# Patient Record
Sex: Female | Born: 1950 | Race: Black or African American | Hispanic: No | Smoking: Current every day smoker
Health system: Southern US, Community
[De-identification: ages and names within clinical notes are randomized; demographics above are authoritative.]

## PROBLEM LIST (undated history)

## (undated) DIAGNOSIS — Z853 Personal history of malignant neoplasm of breast: Secondary | ICD-10-CM

## (undated) DIAGNOSIS — R809 Proteinuria, unspecified: Secondary | ICD-10-CM

## (undated) DIAGNOSIS — Z8719 Personal history of other diseases of the digestive system: Secondary | ICD-10-CM

## (undated) DIAGNOSIS — I5032 Chronic diastolic (congestive) heart failure: Secondary | ICD-10-CM

## (undated) DIAGNOSIS — D649 Anemia, unspecified: Secondary | ICD-10-CM

## (undated) DIAGNOSIS — Z923 Personal history of irradiation: Secondary | ICD-10-CM

## (undated) DIAGNOSIS — M1990 Unspecified osteoarthritis, unspecified site: Secondary | ICD-10-CM

## (undated) DIAGNOSIS — F329 Major depressive disorder, single episode, unspecified: Secondary | ICD-10-CM

## (undated) DIAGNOSIS — J449 Chronic obstructive pulmonary disease, unspecified: Secondary | ICD-10-CM

## (undated) DIAGNOSIS — F411 Generalized anxiety disorder: Secondary | ICD-10-CM

## (undated) DIAGNOSIS — I480 Paroxysmal atrial fibrillation: Secondary | ICD-10-CM

## (undated) DIAGNOSIS — C689 Malignant neoplasm of urinary organ, unspecified: Secondary | ICD-10-CM

## (undated) DIAGNOSIS — I119 Hypertensive heart disease without heart failure: Secondary | ICD-10-CM

## (undated) DIAGNOSIS — I4891 Unspecified atrial fibrillation: Secondary | ICD-10-CM

## (undated) DIAGNOSIS — I2510 Atherosclerotic heart disease of native coronary artery without angina pectoris: Secondary | ICD-10-CM

## (undated) DIAGNOSIS — Z8711 Personal history of peptic ulcer disease: Secondary | ICD-10-CM

## (undated) DIAGNOSIS — K219 Gastro-esophageal reflux disease without esophagitis: Secondary | ICD-10-CM

## (undated) DIAGNOSIS — Z8781 Personal history of (healed) traumatic fracture: Secondary | ICD-10-CM

## (undated) DIAGNOSIS — C50919 Malignant neoplasm of unspecified site of unspecified female breast: Secondary | ICD-10-CM

## (undated) DIAGNOSIS — M109 Gout, unspecified: Secondary | ICD-10-CM

## (undated) DIAGNOSIS — I700 Atherosclerosis of aorta: Secondary | ICD-10-CM

## (undated) DIAGNOSIS — N183 Chronic kidney disease, stage 3 (moderate): Secondary | ICD-10-CM

## (undated) DIAGNOSIS — E785 Hyperlipidemia, unspecified: Secondary | ICD-10-CM

## (undated) DIAGNOSIS — F32A Depression, unspecified: Secondary | ICD-10-CM

## (undated) HISTORY — PX: OTHER SURGICAL HISTORY: SHX169

## (undated) HISTORY — PX: ABDOMINAL HYSTERECTOMY: SHX81

## (undated) HISTORY — DX: Malignant neoplasm of unspecified site of unspecified female breast: C50.919

## (undated) HISTORY — DX: Gout, unspecified: M10.9

## (undated) HISTORY — PX: TONSILLECTOMY: SUR1361

---

## 2002-07-08 HISTORY — PX: WRIST SURGERY: SHX841

## 2011-08-14 HISTORY — PX: BREAST EXCISIONAL BIOPSY: SUR124

## 2011-09-11 HISTORY — PX: PARTIAL MASTECTOMY WITH AXILLARY SENTINEL LYMPH NODE BIOPSY: SHX6004

## 2011-10-04 ENCOUNTER — Telehealth: Payer: Self-pay | Admitting: *Deleted

## 2011-10-04 NOTE — Telephone Encounter (Signed)
Confirmed 10/25/11 appt w/ pt's son Mr. Ashley Royalty.  Mailed before appt letter & packet to son.  Took paperwork to Med Rec for chart.

## 2011-10-07 ENCOUNTER — Telehealth: Payer: Self-pay | Admitting: *Deleted

## 2011-10-07 NOTE — Telephone Encounter (Signed)
Received call from [facility] stating that Dr. Donnie Coffin will not be here and is taking the whole day off, so I called pts son Mr. Victoria Burke and confirmed 11/08/11 appt w/ him.  Mailed another appt letter to him.  Took changes to Med Rec for chart.

## 2011-10-25 ENCOUNTER — Ambulatory Visit: Payer: Self-pay

## 2011-10-25 ENCOUNTER — Ambulatory Visit: Payer: Self-pay | Admitting: Oncology

## 2011-10-25 ENCOUNTER — Other Ambulatory Visit: Payer: Self-pay | Admitting: Lab

## 2011-10-30 ENCOUNTER — Telehealth: Payer: Self-pay | Admitting: *Deleted

## 2011-10-30 NOTE — Telephone Encounter (Signed)
Spoke with Renny's son and confirmed 11/07/11 appt w/ him.  Mailed new before appt letter & packet to pt.  Took paperwork to Med Rec for chart.

## 2011-11-04 ENCOUNTER — Other Ambulatory Visit: Payer: Self-pay | Admitting: *Deleted

## 2011-11-04 DIAGNOSIS — Z853 Personal history of malignant neoplasm of breast: Secondary | ICD-10-CM

## 2011-11-04 DIAGNOSIS — C50212 Malignant neoplasm of upper-inner quadrant of left female breast: Secondary | ICD-10-CM

## 2011-11-04 HISTORY — DX: Personal history of malignant neoplasm of breast: Z85.3

## 2011-11-07 ENCOUNTER — Telehealth: Payer: Self-pay | Admitting: Oncology

## 2011-11-07 ENCOUNTER — Encounter: Payer: Self-pay | Admitting: Oncology

## 2011-11-07 ENCOUNTER — Telehealth: Payer: Self-pay | Admitting: *Deleted

## 2011-11-07 ENCOUNTER — Ambulatory Visit: Payer: Non-veteran care

## 2011-11-07 ENCOUNTER — Ambulatory Visit (HOSPITAL_BASED_OUTPATIENT_CLINIC_OR_DEPARTMENT_OTHER): Admitting: Oncology

## 2011-11-07 ENCOUNTER — Other Ambulatory Visit (HOSPITAL_BASED_OUTPATIENT_CLINIC_OR_DEPARTMENT_OTHER)

## 2011-11-07 VITALS — BP 229/105 | HR 65 | Temp 98.2°F | Ht 61.0 in | Wt 122.9 lb

## 2011-11-07 DIAGNOSIS — C50919 Malignant neoplasm of unspecified site of unspecified female breast: Secondary | ICD-10-CM

## 2011-11-07 DIAGNOSIS — C50212 Malignant neoplasm of upper-inner quadrant of left female breast: Secondary | ICD-10-CM

## 2011-11-07 DIAGNOSIS — C50419 Malignant neoplasm of upper-outer quadrant of unspecified female breast: Secondary | ICD-10-CM

## 2011-11-07 DIAGNOSIS — I10 Essential (primary) hypertension: Secondary | ICD-10-CM

## 2011-11-07 DIAGNOSIS — Z170 Estrogen receptor positive status [ER+]: Secondary | ICD-10-CM

## 2011-11-07 LAB — COMPREHENSIVE METABOLIC PANEL
BUN: 17 mg/dL (ref 6–23)
CO2: 26 mEq/L (ref 19–32)
Creatinine, Ser: 0.71 mg/dL (ref 0.50–1.10)
Glucose, Bld: 89 mg/dL (ref 70–99)
Total Bilirubin: 0.3 mg/dL (ref 0.3–1.2)

## 2011-11-07 LAB — CBC WITH DIFFERENTIAL/PLATELET
Basophils Absolute: 0 10*3/uL (ref 0.0–0.1)
Eosinophils Absolute: 0 10*3/uL (ref 0.0–0.5)
LYMPH%: 44 % (ref 14.0–49.7)
MCV: 95.4 fL (ref 79.5–101.0)
MONO%: 7.5 % (ref 0.0–14.0)
NEUT#: 2.4 10*3/uL (ref 1.5–6.5)
NEUT%: 47.5 % (ref 38.4–76.8)
Platelets: 281 10*3/uL (ref 145–400)
RBC: 4.38 10*6/uL (ref 3.70–5.45)
nRBC: 0 % (ref 0–0)

## 2011-11-07 NOTE — Telephone Encounter (Signed)
I called [facility] to follow up on the patient's pathology report that read equivocal for her 2 neu/  No further testing was completed. I requested the slides to be sent to our pathology dept.

## 2011-11-07 NOTE — Telephone Encounter (Signed)
gave patient appointment for 12-13-2011 md [facility]) gave patient appointment for 11-13-2011 starting at 11-13-2011 starting at 10:30am printed out calendar and gave to the patient

## 2011-11-07 NOTE — Patient Instructions (Signed)
1. We will review your pathology from [place] [facility]. We are getting the slides from there to have them reviewed by our pathologist  2. Refer to Radiation oncology  3. I will see you back in 1 month or sooner

## 2011-11-07 NOTE — Progress Notes (Signed)
New patient today came in with son and family friend, patient is on medicaid patient also has VA benefits insurance, I informed patient about financial assistance patient will wait until she talks with doctor to see if she will need further assistance.

## 2011-11-07 NOTE — Progress Notes (Signed)
Victoria Burke [id number] August 04, 1950 61 y.o. 11/07/2011 4:42 PM  CC Dr. Leanora Cover Dr. 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 Leanora Cover  HISTORY OF PRESENT ILLNESS:  Victoria Burke is a 61 y.o. female with multiple medical problems. She went for a routine mammogram and was found to have mass in the left breast. Ultrasound on 07/23/11 revealed a hypoechoic solid mass at the 2:00 position. This mass apparently had been present previously but had increased in size in comparison to prior imaging from 04/11/11. She was seen by Dr. Percell Boston and went on to have an excisional biopsy on 08/14/11 and a  lumpectomy with SNL on 09/11/11. The pathology showed Invasive ductal carcinoma with DCIS, grade 2, measuring 3.4 cm, ER+, PR+, Her2Neu equivocal. She has now relocated to [place] and is establishing her care at the cancer center. She complains of pain at the surgical site and is very anxious about her diagnosis. She is accompanied by her son and his friend.  Past Medical History: Past Medical History  Diagnosis Date  . 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 Hypertension 11/07/2011    Past Surgical History: Past Surgical History  Procedure Date  . Mastectomy partial / lumpectomy 2/7, 3/7    with SNL 3.4 cm    Family History: History reviewed. No pertinent family history.  Social History History  Substance Use Topics  . Smoking status: Current Everyday Smoker -- 1.0 packs/day  . Smokeless tobacco: Never Used  . Alcohol Use: 7.2 oz/week    12 Cans of beer per week    Allergies: No Known Allergies  Current Medications: Current Outpatient Prescriptions  Medication Sig Dispense Refill  . UNKNOWN TO PATIENT Blood pressure medication BID 300mg         OB/GYN  History:menarche at 13, s/p postmenopausal, G2P2O0, no HRT,   Fertility Discussion: N/A  ECOG PERFORMANCE STATUS: 1 - Symptomatic but completely ambulatory  Genetic Counseling/testing: N/A  REVIEW OF SYSTEMS:  Constitutional: positive for fatigue and malaise Ears, nose, mouth, throat, and face: negative Respiratory: negative Cardiovascular: negative Gastrointestinal: negative Genitourinary:negative Integument/breast: positive for breast tenderness Musculoskeletal:positive for arthralgias, myalgias and stiff joints Neurological: negative  PHYSICAL EXAMINATION: Blood pressure 229/105, pulse 65, temperature 98.2 F (36.8 C), temperature source Oral, height 5\' 1"  (1.549 m), weight 122 lb 14.4 oz (55.747 kg).  QIO:NGEXB, no distress, well nourished, well developed and anxious SKIN: skin color, texture, turgor are normal HEAD: Normocephalic EYES: PERRLA, EOMI, Conjunctiva are pink and non-injected, sclera clear EARS: External ears normal OROPHARYNX:no exudate and no erythema  NECK: supple, no adenopathy LYMPH:  no palpable lymphadenopathy, no hepatosplenomegaly BREAST:right breast normal without mass, skin or nipple changes or axillary nodes, surgical scars noted in left breast no nipple discharge or masses LUNGS: clear to auscultation and percussion HEART: regular rate & rhythm ABDOMEN:abdomen soft, non-tender, normal bowel sounds and no masses or organomegaly BACK: Back symmetric, no curvature. EXTREMITIES:no edema, no clubbing, no cyanosis  NEURO: alert & oriented x 3 with fluent speech, no focal motor/sensory deficits, gait normal, reflexes normal and symmetric     STUDIES/RESULTS: No results found.   LABS:    Chemistry      Component Value Date/Time   NA 140 11/07/2011 1514   K 3.3* 11/07/2011 1514  CL 105 11/07/2011 1514   CO2 26 11/07/2011 1514   BUN 17 11/07/2011 1514   CREATININE 0.71 11/07/2011 1514      Component Value Date/Time   CALCIUM 9.3 11/07/2011 1514    ALKPHOS 100 11/07/2011 1514   AST 46* 11/07/2011 1514   ALT 41* 11/07/2011 1514   BILITOT 0.3 11/07/2011 1514      Lab Results  Component Value Date   WBC 5.0 11/07/2011   HGB 14.1 11/07/2011   HCT 41.8 11/07/2011   MCV 95.4 11/07/2011   PLT 281 11/07/2011           ASSESSMENT    61 year old with:  1. New diagnosis of stage II invasive ductal carcinoma of the left breast. She was diagnosed in [place]. She has undergone a lumpectomy with SNL 0/2 LN positive. Tumor R positive, PR positive and Her2Neu was equivocal.   2. Arthirits  3. Adult sleep apnea  PLAN:    1. I discussed the pathology, treatment and diagnosis of breast cancer with the patient and her family. I explained that we will need to get her tissue blocks and have the Her2Neu repeated. I explained the rational for repeating the test.  2. We also discussed the treatment options with them.  3. I also explained that she will need radiation to the left breast. I have made a referral for her.  4. I will see her back in a few weeks or sooner.     Thank you so much for allowing me to participate in the care of Victoria Burke. I will continue to follow up the patient with you and assist in her care.  All questions were answered. The patient knows to call the clinic with any problems, questions or concerns. We can certainly see the patient much sooner if necessary.  I spent 60 minutes counseling the patient face to face. The total time spent in the appointment was 60 minutes.  Drue Second, MD Medical/Oncology [facility] [phone] (beeper) [phone] (Office)  11/07/2011, 4:42 PM 11/07/2011, 4:42 PM

## 2011-11-08 ENCOUNTER — Ambulatory Visit: Payer: Self-pay

## 2011-11-08 ENCOUNTER — Ambulatory Visit: Payer: Self-pay | Admitting: Oncology

## 2011-11-08 ENCOUNTER — Other Ambulatory Visit: Payer: Self-pay | Admitting: Lab

## 2011-11-11 ENCOUNTER — Encounter: Payer: Self-pay | Admitting: *Deleted

## 2011-11-11 NOTE — Progress Notes (Signed)
Mailed after appt letter to pt. 

## 2011-11-13 ENCOUNTER — Ambulatory Visit: Admission: RE | Admit: 2011-11-13 | Payer: Non-veteran care | Source: Ambulatory Visit | Admitting: Radiation Oncology

## 2011-11-13 ENCOUNTER — Telehealth: Payer: Self-pay | Admitting: Oncology

## 2011-11-13 ENCOUNTER — Ambulatory Visit: Payer: Non-veteran care

## 2011-11-13 NOTE — Telephone Encounter (Signed)
The son called back.   And he expressed anxiety about the change in appointment.  I explained to him that we had just received the slides and the radiation oncologist would not be able to offer definitive treatment until pathology reviewed.    The son verbalized understanding and I gave him my number and pager number for further questions.  I emailed her physician team to make them aware.

## 2011-11-13 NOTE — Telephone Encounter (Signed)
I called and left a message explaining that I had met with the patient and son- at the time of their consult with Dr. Welton Flakes.   The pathology report received from [facility] was incomplete, the Her 2 neu result was read as equivocal.   I had contacted this path dept and requested all slides to be sent.   There was also a question in the margin status.    I had conveyed Dr. Milta Deiters request for a path review to Dr. Colonel Bald, the director of pathology.   I explained the pt and the family at the time of their consult that I would do everything I could to expedite this process however, the MDs really need this information to be able to give treatment recommendations.     I had contacted the path dept yesterday, and they finally received the slides yesterday however, the pathologist had not been able to review- so I suggested that we reschedule the consult with the Rad Oncologist so that recommendations could be conveyed appropriately.  Clydie Braun in radiation communicated the son was upset about the rescheduled appointment.   I left a message for the son reviewing the information I had given them at the time of Dr. Milta Deiters consult and explained that we had not received the slides until yesterday and we want to provide the best care possible for the patient.   I left my number and asked for a return call.   At consult, a Journey and my business card were given to the patient, so they have my contact information, in addition to my voice mail message I left for them.

## 2011-11-19 ENCOUNTER — Ambulatory Visit
Admission: RE | Admit: 2011-11-19 | Discharge: 2011-11-19 | Disposition: A | Discharge status: Home / Self Care | Source: Ambulatory Visit | Attending: Radiation Oncology | Admitting: Radiation Oncology

## 2011-11-19 ENCOUNTER — Encounter: Payer: Self-pay | Admitting: *Deleted

## 2011-11-19 ENCOUNTER — Encounter: Payer: Self-pay | Admitting: Radiation Oncology

## 2011-11-19 VITALS — BP 168/106 | HR 84 | Temp 97.9°F | Resp 20 | Ht 61.0 in | Wt 121.3 lb

## 2011-11-19 DIAGNOSIS — K219 Gastro-esophageal reflux disease without esophagitis: Secondary | ICD-10-CM | POA: Insufficient documentation

## 2011-11-19 DIAGNOSIS — I10 Essential (primary) hypertension: Secondary | ICD-10-CM | POA: Insufficient documentation

## 2011-11-19 DIAGNOSIS — C50419 Malignant neoplasm of upper-outer quadrant of unspecified female breast: Secondary | ICD-10-CM | POA: Insufficient documentation

## 2011-11-19 DIAGNOSIS — Z79899 Other long term (current) drug therapy: Secondary | ICD-10-CM | POA: Insufficient documentation

## 2011-11-19 DIAGNOSIS — C50919 Malignant neoplasm of unspecified site of unspecified female breast: Secondary | ICD-10-CM

## 2011-11-19 HISTORY — DX: Gastro-esophageal reflux disease without esophagitis: K21.9

## 2011-11-19 HISTORY — DX: Unspecified osteoarthritis, unspecified site: M19.90

## 2011-11-19 NOTE — Progress Notes (Signed)
Please see the Nurse Progress Note in the MD Initial Consult Encounter for this patient. 

## 2011-11-19 NOTE — Progress Notes (Addendum)
Pt states she lived in [place] until 1 month ago, returned to [place] due to her father passing away. Pt has no PCP locally, BP high today. She states she ran out of med today.  08/14/11 Left mastectomy, performed in [place]:DCIS, inv ductal carcinoma, ER/PR + ____________________________________________________________________________________________________   [facility] Radiation Oncology NEW PATIENT EVALUATION  Name: Victoria Burke MRN: [id number]  Date:   11/19/2011           DOB: 1951-02-08  Status: outpatient   CC: No primary provider on file.  Victoria December, MD, Dr. Leanora Burke (ph # [phone])   REFERRING PHYSICIAN: [name], MD   DIAGNOSIS: Stage IIA (T2 N0 M0) invasive ductal/DCIS of the left breast   HISTORY OF PRESENT ILLNESS:  Victoria Burke is a 61 y.o. female who is seen today for the courtesy of Dr. Welton Burke for consideration of radiation therapy in the management of her stage IIA (T2 N0 M0) invasive ductal carcinoma/DCIS of the left breast. At the time of a screening mammogram back in October 2012 at [facility] she was found to have a mass within the upper outer left breast respond to a palpable lump a. This was apparently seen in November of 2011 by ultrasound and felt to be suspicious for malignancy. An ultrasound on 04/11/2011 suggest that the mass was smaller and was felt to be probably benign with a short interval followup recommended. Short interval followup on 07/23/2011 showed interval enlargement and thus the mass was felt to be suspicious for malignancy. The patient was seen by Dr. Leanora Burke, a general surgeon in [place], and she underwent an excisional biopsy on August 14, 2011. Pathology showed a 3.4 x 2.8 cm invasive ductal/DCIS with positive margins. Tumor extendied close to the skin. HER-2/neu was equivocal and found to be negative when recently tested here. On 09/11/2011 she underwent a left  partial mastectomy and sentinel lymph node biopsy. There was no residual invasive carcinoma, however there was DCIS focally involving the deep margin. A small 2 mm nodule was also excised from the tumor bed in this showed fibrous tissue with foci of DCIS. Her tumor was reportedly ER/PR positive. She moved to [facility] after her father's recent death and desires to have her radiation therapy and systemic therapy here. She is without complaints today.  PREVIOUS RADIATION THERAPY: No   PAST MEDICAL HISTORY:  has a past medical history of Breast cancer (08/15/11); Hypertension (11/07/2011); Arthritis; Sleep apnea; GERD (gastroesophageal reflux disease); Panic disorder; Cervical spine fracture; and Jaundice.     PAST SURGICAL HISTORY:  Past Surgical History  Procedure Date  . Mastectomy partial / lumpectomy 2/7, 3/7    with SNL 3.4 cm  . Partial hysterectomy   . Abdominal hysterectomy     tumor on right ovary, age 89's  . Wrist surgery      FAMILY HISTORY: family history includes Cancer in her paternal aunt. Her father died in 08/28/22 of this year in his early 26s, unknown cause. Her mother died from cirrhosis at age 60. She is apparently a heavy drinker.   SOCIAL HISTORY:  reports that she has been smoking.  She has never used smokeless tobacco. She reports that she drinks about 7.2 ounces of alcohol per week. She reports that she uses illicit drugs (Marijuana). Separated, 2 children. 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  ALLERGIES: Review of patient's allergies indicates no known allergies.   MEDICATIONS:  Current  Outpatient Prescriptions  Medication Sig Dispense Refill  . lisinopril-hydrochlorothiazide (PRINZIDE,ZESTORETIC) 20-12.5 MG per tablet Take 2 tablets by mouth daily.      Marland Kitchen oxyCODONE-acetaminophen (PERCOCET) 5-325 MG per tablet Take 1 tablet by mouth every 4 (four) hours as needed.         REVIEW OF SYSTEMS:  Pertinent items are noted in HPI.    PHYSICAL  EXAM:  height is 5\' 1"  (1.549 m) and weight is 121 lb 4.8 oz (55.021 kg). Her oral temperature is 97.9 F (36.6 C). Her blood pressure is 168/106 and her pulse is 84. Her respiration is 65.   61 year old African female appearing younger than her stated age. Vital signs: Wt Readings from Last 3 Encounters:  11/19/11 121 lb 4.8 oz (55.021 kg)  11/07/11 122 lb 14.4 oz (55.747 kg)   Temp Readings from Last 3 Encounters:  11/19/11 97.9 F (36.6 C) Oral  11/07/11 98.2 F (36.8 C) Oral   BP Readings from Last 3 Encounters:  11/19/11 168/106  11/07/11 229/105   Pulse Readings from Last 3 Encounters:  11/19/11 84  11/07/11 65    And neck examination: Grossly unremarkable. Nodes: There is no palpable cervical, supraclavicular, or axillary lymphadenopathy. There is a separate transverse incision scar along the axilla. Chest: Lungs clear. Back: Without spinal or CVA discomfort. Heart: Regular rate and rhythm.: There is no upper outer quadrant partial mastectomy scar. No masses are appreciated. Right breast without masses or lesions. Abdomen: Without masses organomegaly. Extremities: Without edema. Neurologic examination: Grossly unremarkable.  LABORATORY DATA:  Lab Results  Component Value Date   WBC 5.0 11/07/2011   HGB 14.1 11/07/2011   HCT 41.8 11/07/2011   MCV 95.4 11/07/2011   PLT 281 11/07/2011   Lab Results  Component Value Date   NA 140 11/07/2011   K 3.3* 11/07/2011   CL 105 11/07/2011   CO2 26 11/07/2011   Lab Results  Component Value Date   ALT 41* 11/07/2011   AST 46* 11/07/2011   ALKPHOS 100 11/07/2011   BILITOT 0.3 11/07/2011      IMPRESSION: Stage IIA (T2, N0, M0) invasive ductal/DCIS of the left breast. I explained to the patient that her local treatment options include mastectomy versus partial mastectomy followed by radiation therapy. She desires breast preservation. Based on the NCCN guidelines, she should have at least a 1 mm margin for DCIS. Her invasive disease has been cleared. I  spoke with Dr. Percell Burke and he'll have the patient return to [facility] for reexcision and then send her back to [facility] for her radiation therapy/systemic therapy. I requested that we be notified of her expected surgery date so I can get her scheduled for a followup visit. I also spoke with her son from  who understands the need for further surgery. I discussed the potential acute and late toxicities of radiation therapy. She can be considered for deep inspiration/breath-hold to avoid cardiac toxicity or prone irradiation based on her breast size. There is a concern that he may not have satisfactory coverage of the axillary tail with her being treated prone. We can discuss this further when she returns for a followup visit.   PLAN: As discussed above. I should be notified of her surgery date soft and get her scheduled for a followup visit. In the meantime, I'll get her tentatively scheduled for a followup visit in one month.   I spent 60 minutes minutes face to face with the patient and more than 50% of that  time was spent in counseling and/or coordination of care.

## 2011-11-20 ENCOUNTER — Telehealth: Payer: Self-pay | Admitting: Oncology

## 2011-11-20 ENCOUNTER — Encounter: Payer: Self-pay | Admitting: *Deleted

## 2011-11-20 NOTE — Progress Notes (Signed)
Encounter addended by: Maryln Gottron, MD on: 11/20/2011  9:28 PM<BR>     Documentation filed: Normajean Glasgow VN

## 2011-11-20 NOTE — Progress Notes (Signed)
Encounter addended by: Maryln Gottron, MD on: 11/20/2011  9:23 PM<BR>     Documentation filed: Visit Diagnoses, Inpatient Notes, Notes Section

## 2011-11-20 NOTE — Telephone Encounter (Signed)
I wanted to follow up with the son after he expressed frustration to the [employer].   I left a message and I will try again tomorrow.  This patient had surgery at another facility and transferring care requires time therefore our process cannot be as efficient.  I had rescheduled a rad onc consult to accommodate the path slides arrival to be reviewed here so the physician could make absolute recommendations.  I will try to reach tomorrow.

## 2011-11-20 NOTE — Progress Notes (Signed)
Ordered Oncotype Dx test w/ [facility].  Faxed request to Path. 

## 2011-11-25 ENCOUNTER — Telehealth: Payer: Self-pay | Admitting: Oncology

## 2011-11-25 NOTE — Telephone Encounter (Signed)
I called Dana from Dr. Ruel Favors office to see if there had been an appt scheduled-  And there has not been,    I asked Annabelle Harman to contact the son to schedule an appt and I texted the son with Dana's number to contact her.   I asked for the son to let me know when it is scheduled so we can obtain records again.

## 2011-11-27 ENCOUNTER — Encounter: Payer: Self-pay | Admitting: Dietician

## 2011-12-03 ENCOUNTER — Encounter: Payer: Self-pay | Admitting: *Deleted

## 2011-12-03 NOTE — Progress Notes (Signed)
Received Oncotype Dx results of 8.  Gave copy to MD.  Took copy to Med Rec to scan. 

## 2011-12-04 ENCOUNTER — Encounter: Payer: Self-pay | Admitting: Oncology

## 2011-12-04 NOTE — Progress Notes (Unsigned)
The son notified me that the patient's surgery is scheduled June 6 in [place].   She is currently scheduled to see Dr. Welton Flakes June 7 so I asked Edwena Bunde to reschedule her follow up with Dr. Welton Flakes.   Her oncotype score is low., so delaying her follow up with Dr. Welton Flakes will not interefer with her care.

## 2011-12-13 ENCOUNTER — Ambulatory Visit: Payer: Non-veteran care | Admitting: Oncology

## 2011-12-17 ENCOUNTER — Encounter: Payer: Self-pay | Admitting: Radiation Oncology

## 2011-12-18 ENCOUNTER — Telehealth: Payer: Self-pay | Admitting: Medical Oncology

## 2011-12-18 ENCOUNTER — Telehealth: Payer: Self-pay | Admitting: *Deleted

## 2011-12-18 ENCOUNTER — Encounter: Payer: Self-pay | Admitting: Radiation Oncology

## 2011-12-18 NOTE — Telephone Encounter (Signed)
Received call from [facility] in radiation oncology stating that patient was unaware of reschedule appointments on 6/27.  Contacted patient with the new date and time of appointments.  Patient confirmed appointments on 6/27: lab @ 1 and MD @ 130.  Instructed patient to arrive a little before 1 to be checked in with registration.  Patient expressed understanding, no further questions at this time.

## 2011-12-18 NOTE — Telephone Encounter (Signed)
Pt has not had re-excision of breast. Per Dr Ruel Favors note dated 12/12/11, pt is scheduled for this surgery on 01/03/12. Dr Dayton Scrape requested pt be rescheduled for FU new consult on 01/14/12. Called pt to reschedule FU new consult w/Dr Dayton Scrape. Pt states she plans to reschedule her surgery to 01/10/12 so her son can be present. She also stated she will have surgery in [place], [place] not in [place]. She has not rescheduled it at this time. Pt does request her appt w/Dr Dayton Scrape be rescheduled for 01/14/12 and understands it may be changed if she delays her surgery.   Also noted to pt that she did not see Dr Welton Flakes on 12/13/11.  Pt stated she was unaware of that appt. Informed her would call Dr Milta Deiters office and have them call her re: reschedule appt. Left vm for Dr Milta Deiters RN to call pt and remind her of appt later this month.

## 2011-12-19 ENCOUNTER — Ambulatory Visit

## 2011-12-19 ENCOUNTER — Ambulatory Visit: Admitting: Radiation Oncology

## 2011-12-25 ENCOUNTER — Encounter: Payer: Self-pay | Admitting: *Deleted

## 2011-12-25 ENCOUNTER — Telehealth: Payer: Self-pay | Admitting: Oncology

## 2011-12-25 ENCOUNTER — Telehealth: Payer: Self-pay | Admitting: *Deleted

## 2011-12-25 NOTE — Telephone Encounter (Signed)
Pt's son called stating that surgery has been changed in [place] to 7/5.  Confirmed 01/14/12 appt w/ him.

## 2011-12-25 NOTE — Telephone Encounter (Signed)
S/w the pt and she is aware of her r/s June appts to July due to the md is on call on 01/02/2012

## 2011-12-30 ENCOUNTER — Encounter: Payer: Self-pay | Admitting: *Deleted

## 2011-12-30 NOTE — Progress Notes (Signed)
[facility] Psychosocial Distress Screening Clinical Social Work  Clinical Social Work was referred by distress screening protocol.  The patient scored a 6 on the Psychosocial Distress Thermometer which indicates moderate distress. Clinical Social Worker contacted pt at home to assess for distress and other psychosocial needs.  Pt stated she was doing well, and getting ready for her surgery on 01/10/12.  CSW informed pt of the support team and supportive services at [facility].  Pt expressed need for assistance with transportation.  CSW informed pt of [facility] and their ability to send a 1 time gas card.  Pt agreed to CSW making referral to [facility] for transportation assistance.  CSW encouraged pt to call with any additional needs or concerns.      Tamala Julian, MSW, LCSW Clinical Social Worker [facility] [phone]

## 2012-01-02 ENCOUNTER — Ambulatory Visit: Admitting: Oncology

## 2012-01-02 ENCOUNTER — Other Ambulatory Visit: Admitting: Lab

## 2012-01-08 ENCOUNTER — Other Ambulatory Visit: Admitting: Lab

## 2012-01-08 ENCOUNTER — Ambulatory Visit: Admitting: Oncology

## 2012-01-14 ENCOUNTER — Ambulatory Visit: Admitting: Oncology

## 2012-01-14 ENCOUNTER — Ambulatory Visit

## 2012-01-14 ENCOUNTER — Ambulatory Visit: Admitting: Radiation Oncology

## 2012-01-14 ENCOUNTER — Other Ambulatory Visit: Admitting: Lab

## 2012-01-14 ENCOUNTER — Other Ambulatory Visit: Payer: Self-pay | Admitting: *Deleted

## 2012-01-14 DIAGNOSIS — C50419 Malignant neoplasm of upper-outer quadrant of unspecified female breast: Secondary | ICD-10-CM

## 2012-01-15 ENCOUNTER — Encounter: Payer: Self-pay | Admitting: *Deleted

## 2012-01-15 ENCOUNTER — Telehealth: Payer: Self-pay | Admitting: *Deleted

## 2012-01-15 NOTE — Telephone Encounter (Signed)
[facility] 01/14/12. Called pt, inquired about missed appt. Pt states " I didn't have a ride" Requested to transfer pt to scheduling to allow r/s appt. Pt requested not to be transferred as she was at eye doctor.  Pt advised it would be better to call her later today to r/s appt. Verbalized understanding. Onc tx sent

## 2012-01-16 ENCOUNTER — Telehealth: Payer: Self-pay | Admitting: Oncology

## 2012-01-16 NOTE — Telephone Encounter (Signed)
lmonvm advising the pt of her appt on 01/30/2012. Per ftka pt missed her last appt

## 2012-01-17 ENCOUNTER — Telehealth: Payer: Self-pay | Admitting: *Deleted

## 2012-01-17 NOTE — Telephone Encounter (Signed)
Patient called and left a message w/ [place] stating that his mother has being missing appointments because he was not aware of them and he is her source of transportation, so Turkey gave to me to handle.  I called and left a message for the patient to let her know that I would get with our schedulers and let them know to make him to main contact for appointments in our system.

## 2012-01-30 ENCOUNTER — Ambulatory Visit: Admitting: Oncology

## 2012-01-30 ENCOUNTER — Ambulatory Visit

## 2012-01-30 ENCOUNTER — Ambulatory Visit: Admitting: Radiation Oncology

## 2012-01-30 ENCOUNTER — Other Ambulatory Visit

## 2012-02-03 ENCOUNTER — Telehealth: Payer: Self-pay | Admitting: Radiation Oncology

## 2012-02-03 ENCOUNTER — Telehealth: Payer: Self-pay | Admitting: Medical Oncology

## 2012-02-03 NOTE — Telephone Encounter (Signed)
Son LMOVM stating "My mom has missed another appointment and has still not gotten her surgery.  Can we get this surgery done at [facility], can Dr. Welton Flakes send a referral for that?"  Will review with MD  Patient originally scheduled to receive lumpectomy in [facility] January 10, 2012.

## 2012-02-03 NOTE — Telephone Encounter (Signed)
LVM with son, [phone].  Per Dr. Dayton Scrape, canceled.  Does not need to see patient until after surgery.  Dr. Welton Flakes is referring to surgeon.

## 2012-02-05 ENCOUNTER — Telehealth: Payer: Self-pay | Admitting: Oncology

## 2012-02-05 ENCOUNTER — Encounter: Payer: Self-pay | Admitting: Oncology

## 2012-02-05 NOTE — Telephone Encounter (Signed)
I left a message for the son to discuss the plan of care.   She has missed multiple appointments, and was to have had her reexcision in [place] over a month ago.   Waiting for a return call .

## 2012-02-05 NOTE — Telephone Encounter (Signed)
Consult scheduled with Dr. Johna Sheriff at [facility] on 8/16 at 2:45 PM.  I will have her case presented at breast conference and in basket Dr. Johna Sheriff to update him about the case.

## 2012-02-05 NOTE — Progress Notes (Unsigned)
I called Victoria Burke at Dr. Ruel Favors office in [place].   The patient was scheduled twice for a re-excision and showed up an hour late both times therefore did not have surgery.   She has no showed here for multiple appointments.  I have left a message for the son to discuss next steps.

## 2012-02-05 NOTE — Telephone Encounter (Signed)
The son called back and he lives in [place] and just cant get his mom to all of these appts.   He stated it would be easier for her to be seen here in [place] versus going back to [place].   He does not work Fridays- so I will try to schedule a Friday appt.   I asked him to call me if he cannot make it to appts or has any concerns about his mother's care.    The son has my number and agreed to call me.   I contacted [facility] to schedule a consult with a surgeon and will have her case presented before the surgeon's consult for discussion.Marland Kitchen

## 2012-02-07 ENCOUNTER — Telehealth: Payer: Self-pay | Admitting: *Deleted

## 2012-02-07 NOTE — Telephone Encounter (Signed)
Left vm for son to call back with location of mammo performed at in order to request films prior to appt with Dr. Johna Sheriff.

## 2012-02-10 ENCOUNTER — Ambulatory Visit: Admitting: Family Medicine

## 2012-02-11 ENCOUNTER — Ambulatory Visit

## 2012-02-11 ENCOUNTER — Ambulatory Visit: Admitting: Radiation Oncology

## 2012-02-21 ENCOUNTER — Ambulatory Visit (INDEPENDENT_AMBULATORY_CARE_PROVIDER_SITE_OTHER): Payer: Self-pay | Admitting: General Surgery

## 2012-02-21 ENCOUNTER — Telehealth: Payer: Self-pay | Admitting: Oncology

## 2012-02-21 VITALS — BP 170/102 | HR 72 | Temp 97.2°F | Ht 62.0 in | Wt 124.0 lb

## 2012-02-21 DIAGNOSIS — C50919 Malignant neoplasm of unspecified site of unspecified female breast: Secondary | ICD-10-CM
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
## 2012-02-21 NOTE — Telephone Encounter (Signed)
The son called stating that he is not in [place] and cannot provide transportation for the patient to be seen by the surgeon today.

## 2012-02-21 NOTE — Telephone Encounter (Signed)
[place] is not able to take her but called to state that his mother will take the bus.

## 2012-02-24 ENCOUNTER — Telehealth: Payer: Self-pay | Admitting: *Deleted

## 2012-02-24 NOTE — Telephone Encounter (Signed)
Per Dr Dayton Scrape, left vm w/callback # for pt's son to return my call re: his mother's appts in this office.

## 2012-03-06 ENCOUNTER — Telehealth: Payer: Self-pay | Admitting: *Deleted

## 2012-03-06 NOTE — Telephone Encounter (Signed)
Per Dr Jamse Mead office, pt is scheduled for breast re-excision surgery on 03/18/12, 8:30 am.

## 2012-03-16 ENCOUNTER — Encounter (HOSPITAL_BASED_OUTPATIENT_CLINIC_OR_DEPARTMENT_OTHER): Payer: Self-pay | Admitting: *Deleted

## 2012-03-16 NOTE — Pre-Procedure Instructions (Addendum)
To come for BMET and CXR Reviewed with Dr. Gelene Mink; need to get anesthesia records from [place]; pt. may come for surgery

## 2012-03-17 NOTE — Progress Notes (Signed)
Son called-pt not able to come in today for labs and cxr-told to come in 2 hr early

## 2012-03-18 ENCOUNTER — Encounter (HOSPITAL_BASED_OUTPATIENT_CLINIC_OR_DEPARTMENT_OTHER): Payer: Self-pay | Admitting: Anesthesiology

## 2012-03-18 ENCOUNTER — Ambulatory Visit (HOSPITAL_COMMUNITY)

## 2012-03-18 ENCOUNTER — Ambulatory Visit (HOSPITAL_BASED_OUTPATIENT_CLINIC_OR_DEPARTMENT_OTHER)
Admission: RE | Admit: 2012-03-18 | Discharge: 2012-03-18 | Disposition: A | Discharge status: Home / Self Care | Source: Ambulatory Visit | Attending: General Surgery | Admitting: General Surgery

## 2012-03-18 ENCOUNTER — Ambulatory Visit (HOSPITAL_BASED_OUTPATIENT_CLINIC_OR_DEPARTMENT_OTHER): Admitting: Anesthesiology

## 2012-03-18 ENCOUNTER — Encounter (HOSPITAL_BASED_OUTPATIENT_CLINIC_OR_DEPARTMENT_OTHER)
Admission: RE | Disposition: A | Discharge status: Home / Self Care | Payer: Self-pay | Source: Ambulatory Visit | Attending: General Surgery

## 2012-03-18 ENCOUNTER — Encounter (HOSPITAL_BASED_OUTPATIENT_CLINIC_OR_DEPARTMENT_OTHER): Payer: Self-pay | Admitting: *Deleted

## 2012-03-18 DIAGNOSIS — Z803 Family history of malignant neoplasm of breast: Secondary | ICD-10-CM | POA: Insufficient documentation

## 2012-03-18 DIAGNOSIS — D0590 Unspecified type of carcinoma in situ of unspecified breast: Secondary | ICD-10-CM

## 2012-03-18 DIAGNOSIS — C50419 Malignant neoplasm of upper-outer quadrant of unspecified female breast: Secondary | ICD-10-CM | POA: Insufficient documentation

## 2012-03-18 DIAGNOSIS — I10 Essential (primary) hypertension: Secondary | ICD-10-CM | POA: Insufficient documentation

## 2012-03-18 HISTORY — DX: Hyperlipidemia, unspecified: E78.5

## 2012-03-18 HISTORY — DX: Depression, unspecified: F32.A

## 2012-03-18 HISTORY — DX: Personal history of peptic ulcer disease: Z87.11

## 2012-03-18 HISTORY — DX: Major depressive disorder, single episode, unspecified: F32.9

## 2012-03-18 HISTORY — DX: Personal history of other diseases of the digestive system: Z87.19

## 2012-03-18 HISTORY — DX: Personal history of (healed) traumatic fracture: Z87.81

## 2012-03-18 LAB — POCT I-STAT, CHEM 8
Calcium, Ion: 1.26 mmol/L (ref 1.13–1.30)
Glucose, Bld: 90 mg/dL (ref 70–99)
HCT: 49 % — ABNORMAL HIGH (ref 36.0–46.0)
Hemoglobin: 16.7 g/dL — ABNORMAL HIGH (ref 12.0–15.0)
TCO2: 24 mmol/L (ref 0–100)

## 2012-03-18 SURGERY — EXCISION, LESION, BREAST
Anesthesia: General | Site: Breast | Laterality: Left | Wound class: Clean

## 2012-03-18 MED ORDER — ACETAMINOPHEN 10 MG/ML IV SOLN
1000.0000 mg | Freq: Once | INTRAVENOUS | Status: AC
  Administered 2012-03-18: 1000 mg via INTRAVENOUS

## 2012-03-18 MED ORDER — PROPOFOL 10 MG/ML IV BOLUS
INTRAVENOUS | Status: DC | PRN
  Administered 2012-03-18: 200 mg via INTRAVENOUS

## 2012-03-18 MED ORDER — MIDAZOLAM HCL 2 MG/2ML IJ SOLN: 0.5000 mg | Freq: Once | INTRAMUSCULAR | Status: DC | PRN

## 2012-03-18 MED ORDER — MIDAZOLAM HCL 5 MG/5ML IJ SOLN
INTRAMUSCULAR | Status: DC | PRN
  Administered 2012-03-18: 2 mg via INTRAVENOUS

## 2012-03-18 MED ORDER — PROMETHAZINE HCL 25 MG/ML IJ SOLN: 6.2500 mg | INTRAMUSCULAR | Status: DC | PRN

## 2012-03-18 MED ORDER — CHLORHEXIDINE GLUCONATE 4 % EX LIQD: 1.0000 "application " | Freq: Once | CUTANEOUS | Status: DC

## 2012-03-18 MED ORDER — MEPERIDINE HCL 25 MG/ML IJ SOLN: 6.2500 mg | INTRAMUSCULAR | Status: DC | PRN

## 2012-03-18 MED ORDER — ONDANSETRON HCL 4 MG/2ML IJ SOLN
INTRAMUSCULAR | Status: DC | PRN
  Administered 2012-03-18: 4 mg via INTRAVENOUS

## 2012-03-18 MED ORDER — HYDROMORPHONE HCL PF 1 MG/ML IJ SOLN
0.2500 mg | INTRAMUSCULAR | Status: DC | PRN
  Administered 2012-03-18 (×3): 0.5 mg via INTRAVENOUS

## 2012-03-18 MED ORDER — CEFAZOLIN SODIUM-DEXTROSE 2-3 GM-% IV SOLR
2.0000 g | INTRAVENOUS | Status: AC
  Administered 2012-03-18: 2 g via INTRAVENOUS

## 2012-03-18 MED ORDER — DEXAMETHASONE SODIUM PHOSPHATE 4 MG/ML IJ SOLN
INTRAMUSCULAR | Status: DC | PRN
  Administered 2012-03-18: 10 mg via INTRAVENOUS

## 2012-03-18 MED ORDER — HYDROCODONE-ACETAMINOPHEN 5-325 MG PO TABS
1.0000 | ORAL_TABLET | ORAL | Status: AC | PRN
Start: 2012-03-18 — End: 2012-03-28

## 2012-03-18 MED ORDER — BUPIVACAINE-EPINEPHRINE 0.25% -1:200000 IJ SOLN
INTRAMUSCULAR | Status: DC | PRN
  Administered 2012-03-18: 30 mL

## 2012-03-18 MED ORDER — LACTATED RINGERS IV SOLN
INTRAVENOUS | Status: DC
  Administered 2012-03-18: 08:00:00 via INTRAVENOUS
  Administered 2012-03-18: 10 mL/h via INTRAVENOUS

## 2012-03-18 MED ORDER — LIDOCAINE HCL (CARDIAC) 20 MG/ML IV SOLN
INTRAVENOUS | Status: DC | PRN
  Administered 2012-03-18: 50 mg via INTRAVENOUS

## 2012-03-18 MED ORDER — FENTANYL CITRATE 0.05 MG/ML IJ SOLN
INTRAMUSCULAR | Status: DC | PRN
  Administered 2012-03-18 (×2): 50 ug via INTRAVENOUS

## 2012-03-18 SURGICAL SUPPLY — 48 items
BINDER BREAST XLRG (GAUZE/BANDAGES/DRESSINGS) ×2 IMPLANT
BLADE SURG 10 STRL SS (BLADE) IMPLANT
BLADE SURG 15 STRL LF DISP TIS (BLADE) ×1 IMPLANT
BLADE SURG 15 STRL SS (BLADE) ×1
CANISTER SUCTION 1200CC (MISCELLANEOUS) ×2 IMPLANT
CHLORAPREP W/TINT 26ML (MISCELLANEOUS) ×2 IMPLANT
CLIP TI MEDIUM 6 (CLIP) IMPLANT
CLIP TI WIDE RED SMALL 6 (CLIP) IMPLANT
CLOTH BEACON ORANGE TIMEOUT ST (SAFETY) ×2 IMPLANT
COVER MAYO STAND STRL (DRAPES) ×2 IMPLANT
COVER TABLE BACK 60X90 (DRAPES) ×2 IMPLANT
DERMABOND ADVANCED (GAUZE/BANDAGES/DRESSINGS) ×2
DERMABOND ADVANCED .7 DNX12 (GAUZE/BANDAGES/DRESSINGS) ×2 IMPLANT
DEVICE DUBIN W/COMP PLATE 8390 (MISCELLANEOUS) IMPLANT
DRAPE PED LAPAROTOMY (DRAPES) ×2 IMPLANT
DRAPE UTILITY XL STRL (DRAPES) ×2 IMPLANT
ELECT COATED BLADE 2.86 ST (ELECTRODE) ×2 IMPLANT
ELECT REM PT RETURN 9FT ADLT (ELECTROSURGICAL) ×2
ELECTRODE REM PT RTRN 9FT ADLT (ELECTROSURGICAL) ×1 IMPLANT
GLOVE BIO SURGEON STRL SZ 6.5 (GLOVE) ×2 IMPLANT
GLOVE BIOGEL PI IND STRL 6.5 (GLOVE) ×1 IMPLANT
GLOVE BIOGEL PI IND STRL 8 (GLOVE) ×1 IMPLANT
GLOVE BIOGEL PI INDICATOR 6.5 (GLOVE) ×1
GLOVE BIOGEL PI INDICATOR 8 (GLOVE) ×1
GLOVE SS BIOGEL STRL SZ 7.5 (GLOVE) ×1 IMPLANT
GLOVE SUPERSENSE BIOGEL SZ 7.5 (GLOVE) ×1
GOWN PREVENTION PLUS XLARGE (GOWN DISPOSABLE) ×2 IMPLANT
GOWN PREVENTION PLUS XXLARGE (GOWN DISPOSABLE) ×2 IMPLANT
NEEDLE HYPO 25X1 1.5 SAFETY (NEEDLE) ×2 IMPLANT
NS IRRIG 1000ML POUR BTL (IV SOLUTION) ×2 IMPLANT
PACK BASIN DAY SURGERY FS (CUSTOM PROCEDURE TRAY) ×2 IMPLANT
PENCIL BUTTON HOLSTER BLD 10FT (ELECTRODE) ×2 IMPLANT
SLEEVE SCD COMPRESS KNEE MED (MISCELLANEOUS) ×2 IMPLANT
STAPLER VISISTAT 35W (STAPLE) IMPLANT
SUT MON AB 3-0 SH 27 (SUTURE) ×2
SUT MON AB 3-0 SH27 (SUTURE) ×2 IMPLANT
SUT MON AB 5-0 PS2 18 (SUTURE) ×2 IMPLANT
SUT SILK 3 0 SH 30 (SUTURE) IMPLANT
SUT VIC AB 3-0 SH 27 (SUTURE) ×1
SUT VIC AB 3-0 SH 27X BRD (SUTURE) ×1 IMPLANT
SUT VIC AB 4-0 BRD 54 (SUTURE) IMPLANT
SYR BULB 3OZ (MISCELLANEOUS) IMPLANT
SYR CONTROL 10ML LL (SYRINGE) ×2 IMPLANT
TOWEL OR 17X24 6PK STRL BLUE (TOWEL DISPOSABLE) ×2 IMPLANT
TOWEL OR NON WOVEN STRL DISP B (DISPOSABLE) ×2 IMPLANT
TUBE CONNECTING 20X1/4 (TUBING) IMPLANT
WATER STERILE IRR 1000ML POUR (IV SOLUTION) IMPLANT
YANKAUER SUCT BULB TIP NO VENT (SUCTIONS) ×2 IMPLANT

## 2012-03-18 NOTE — Anesthesia Procedure Notes (Signed)
Procedure Name: LMA Insertion Date/Time: 03/18/2012 8:45 AM Performed by: Zenia Resides D Pre-anesthesia Checklist: Patient identified, Emergency Drugs available, Suction available, Patient being monitored and Timeout performed Patient Re-evaluated:Patient Re-evaluated prior to inductionOxygen Delivery Method: Circle System Utilized Preoxygenation: Pre-oxygenation with 100% oxygen Intubation Type: IV induction Ventilation: Mask ventilation without difficulty LMA: LMA inserted LMA Size: 4.0 Number of attempts: 1 Airway Equipment and Method: bite block Placement Confirmation: positive ETCO2 and breath sounds checked- equal and bilateral Tube secured with: Tape Dental Injury: Teeth and Oropharynx as per pre-operative assessment

## 2012-03-18 NOTE — Anesthesia Preprocedure Evaluation (Signed)
Anesthesia Evaluation  Patient identified by MRN, date of birth, ID band Patient awake    Reviewed: Allergy & Precautions, H&P , NPO status , Patient's Chart, lab work & pertinent test results  History of Anesthesia Complications Negative for: history of anesthetic complications  Airway Mallampati: II TM Distance: >3 FB Neck ROM: Full    Dental  (+) Poor Dentition, Loose, Missing, Chipped and Dental Advisory Given   Pulmonary neg shortness of breath, asthma , COPD COPD inhaler, Current Smoker,  breath sounds clear to auscultation  Pulmonary exam normal       Cardiovascular hypertension, Pt. on medications Rhythm:Regular Rate:Normal     Neuro/Psych PSYCHIATRIC DISORDERS Depression negative neurological ROS     GI/Hepatic Neg liver ROS, GERD-  Controlled,  Endo/Other  negative endocrine ROS  Renal/GU negative Renal ROS     Musculoskeletal   Abdominal   Peds  Hematology   Anesthesia Other Findings   Reproductive/Obstetrics                           Anesthesia Physical Anesthesia Plan  ASA: II  Anesthesia Plan: General   Post-op Pain Management:    Induction: Intravenous  Airway Management Planned: LMA  Additional Equipment:   Intra-op Plan:   Post-operative Plan:   Informed Consent: I have reviewed the patients History and Physical, chart, labs and discussed the procedure including the risks, benefits and alternatives for the proposed anesthesia with the patient or authorized representative who has indicated his/her understanding and acceptance.   Dental advisory given  Plan Discussed with: CRNA and Surgeon  Anesthesia Plan Comments: (Plan routine monitors, GA- LMA OK)        Anesthesia Quick Evaluation
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
## 2012-03-18 NOTE — Transfer of Care (Signed)
Immediate Anesthesia Transfer of Care Note  Patient: Victoria Burke  Procedure(s) Performed: Procedure(s) (LRB) with comments: RE-EXCISION OF BREAST LUMPECTOMY (Left) - re-excision left breast lumpectomy  Patient Location: PACU  Anesthesia Type: General  Level of Consciousness: awake, alert  and oriented  Airway & Oxygen Therapy: Patient Spontanous Breathing and Patient connected to nasal cannula oxygen  Post-op Assessment: Report given to PACU RN and Post -op Vital signs reviewed and stable  Post vital signs: Reviewed and stable  Complications: No apparent anesthesia complications

## 2012-03-18 NOTE — Interval H&P Note (Signed)
History and Physical Interval Note:  03/18/2012 8:34 AM  Victoria Burke  has presented today for surgery, with the diagnosis of left breast cancer  The various methods of treatment have been discussed with the patient and family. After consideration of risks, benefits and other options for treatment, the patient has consented to  Procedure(s) (LRB) with comments: RE-EXCISION OF BREAST LUMPECTOMY (Left) - re-excision left breast lumpectomy as a surgical intervention .  The patient's history has been reviewed, patient examined, no change in status, stable for surgery.  I have reviewed the patient's chart and labs.  Questions were answered to the patient's satisfaction.     Leory Allinson T

## 2012-03-18 NOTE — Op Note (Signed)
Preoperative Diagnosis: left breast cancer  Postoprative Diagnosis: left breast cancer  Procedure: Procedure(s): RE-EXCISION OF BREAST LUMPECTOMY   Surgeon: Glenna Fellows T   Assistants: None  Anesthesia:  General LMA anesthesiaDiagnos  Indications:   Patient is a 61 year old female with a history of previous left breast lumpectomy and sentinel lymph node biopsy at an outside facility in March. She had a positive margin for ductal carcinoma in situ at the inferior and posterior margins pathology reviewed our facility. She is moved to this area and came to the cancer center for further treatment. We have recommended reexcision of her lumpectomy to obtain negative margins. I discussed the indications for the procedure and risks of bleeding, infection, anesthetic complications, possible need for further surgery based on final pathology with the patient and her family and they understand and agree to proceed.  Procedure Detail:  Patient brought to the operating room, placed in supine position on the operating table, and laryngeal mask general anesthesia induced. The left breast was widely sterilely prepped and draped. She received preoperative IV antibiotics. Patient timeout was performed the correct procedure verified. The previous lumpectomy scar in the upper-outer quadrant was elliptically excised and dissection deepened down into the subcutaneous tissue. The palpable thickening at the previous lobectomy was then completely excised with cautery back to normal-appearing tissue. This was carried down to the chest wall posteriorly. The specimen was removed and inked for margins and sent for permanent pathology. I then also took an additional inferior margin which was oriented and sent for permanent pathology. The posterior margin again was down to fascia. The wound was irrigated and soft tissue were infiltrated with Marcaine with epinephrine. Complete hemostasis was obtained. The deeper breast  tissue was closed with interrupted 3-0 Vicryl as was subcutaneous tissue and the skin closed with subcuticular 4-0 Monocryl and Dermabond. Sponge needle and counts were correct.    Blood Given: none          Specimens: #1left breast tissue reexcision lumpectomy #2 further inferior margin        Complications:  * No complications entered in OR log *         Disposition: PACU - hemodynamically stable.         Condition: stable  Mariella Saa MD, FACS  03/18/2012, 9:40 AM

## 2012-03-18 NOTE — Anesthesia Postprocedure Evaluation (Signed)
  Anesthesia Post-op Note  Patient: Victoria Burke  Procedure(s) Performed: Procedure(s) (LRB) with comments: RE-EXCISION OF BREAST LUMPECTOMY (Left) - re-excision left breast lumpectomy  Patient Location: PACU  Anesthesia Type: General  Level of Consciousness: awake, alert  and oriented  Airway and Oxygen Therapy: Patient Spontanous Breathing  Post-op Pain: none  Post-op Assessment: Post-op Vital signs reviewed, Patient's Cardiovascular Status Stable, Respiratory Function Stable, Patent Airway, No signs of Nausea or vomiting and Pain level controlled  Post-op Vital Signs: Reviewed and stable  Complications: No apparent anesthesia complications

## 2012-03-19 ENCOUNTER — Telehealth (INDEPENDENT_AMBULATORY_CARE_PROVIDER_SITE_OTHER): Payer: Self-pay

## 2012-03-19 NOTE — Telephone Encounter (Signed)
Tried to call pt but n/a to let her know the f/u appt with Dr Johna Sheriff so we need to keep trying to reach pt.

## 2012-03-20 ENCOUNTER — Telehealth (INDEPENDENT_AMBULATORY_CARE_PROVIDER_SITE_OTHER): Payer: Self-pay | Admitting: General Surgery

## 2012-03-20 NOTE — Telephone Encounter (Signed)
Call the patient and discussed the path report.

## 2012-03-25 ENCOUNTER — Encounter: Payer: Self-pay | Admitting: Radiation Oncology

## 2012-03-27 ENCOUNTER — Ambulatory Visit
Admission: RE | Admit: 2012-03-27 | Discharge: 2012-03-27 | Disposition: A | Discharge status: Home / Self Care | Source: Ambulatory Visit | Attending: Radiation Oncology | Admitting: Radiation Oncology

## 2012-03-27 ENCOUNTER — Encounter: Payer: Self-pay | Admitting: Radiation Oncology

## 2012-03-27 VITALS — BP 155/83 | HR 77 | Temp 98.1°F | Resp 20 | Wt 122.2 lb

## 2012-03-27 DIAGNOSIS — C50419 Malignant neoplasm of upper-outer quadrant of unspecified female breast: Secondary | ICD-10-CM | POA: Insufficient documentation

## 2012-03-27 HISTORY — DX: Anemia, unspecified: D64.9

## 2012-03-27 NOTE — Progress Notes (Signed)
Seen by Dr. Dayton Scrape 11/19/11 Stage !!A invasive ductal carcinoma/DCIS of left breast  Past hx breast cancer(2/7/130 Patient here alert,oriented x3, left breast incision well healed,under inframmary fold has skin discoloration raised, looks like tape allergy  ,where tape was applied  From lumpectomy , patient says soreness right breast where a boil was ,resolved though, patient denies breast pain    Allergies:NKda

## 2012-03-27 NOTE — Progress Notes (Signed)
Radiation Oncology         [phone] ________________________________  Name: Victoria Burke MRN: [id number]  Date: 03/27/2012  DOB: 28-Mar-1951  Follow-Up Visit Note  CC: Jackie Plum, MD  [name], MD  Diagnosis:  Pathologic T2, N0, M0 invasive ductal left breast cancer with DCIS; ER PR positive   Narrative:  The patient returns today for routine follow-up.  Dr. Dayton Scrape is her radiation oncologist but was out of town today.    The patient underwent reexcision on 03/18/2012 at the left breast lumpectomy site. Residual ductal carcinoma in situ was excised. According to the pathology report, the closest margin for the in situ disease was 0.1 mm; this is at the posterior margin. From looking at the operative note, it appears that this is at the fascia.   The patient is recovering well from surgery.             Of note, the patient underwent Oncotype DX testing on 11/22/2011. Her score was 8 - this quoted a 6% average rate of distant recurrence over 5 years for similar patients treated with tamoxifen.            ALLERGIES:   has no known allergies.  Meds: Current Outpatient Prescriptions  Medication Sig Dispense Refill  . albuterol (PROVENTIL HFA;VENTOLIN HFA) 108 (90 BASE) MCG/ACT inhaler Inhale 2 puffs into the lungs every 6 (six) hours as needed.      Marland Kitchen amLODipine (NORVASC) 10 MG tablet Take 10 mg by mouth daily.      . fenofibrate (TRICOR) 48 MG tablet Take 48 mg by mouth daily.      Marland Kitchen HYDROcodone-acetaminophen (NORCO/VICODIN) 5-325 MG per tablet Take 1-2 tablets by mouth every 4 (four) hours as needed for pain.  25 tablet  1  . lisinopril-hydrochlorothiazide (PRINZIDE,ZESTORETIC) 20-12.5 MG per tablet Take 1 tablet by mouth daily. 20-25      . methocarbamol (ROBAXIN) 500 MG tablet Take 500 mg by mouth 4 (four) times daily.      . traMADol (ULTRAM) 50 MG tablet Take 50 mg by mouth every 6 (six) hours as needed.        Physical Findings: The patient is in no acute distress.  Patient is alert and oriented.  weight is 122 lb 3.2 oz (55.43 kg). Her oral temperature is 98.1 F (36.7 C). Her blood pressure is 155/83 and her pulse is 77. Her respiration is 20. Marland Kitchen  No significant changes. I examined the patient's left breast which appears to be healing well from reexcision. The patient's lumpectomy scar is in her upper outer quadrant. No sign of infection. No excessive seroma is palpable.   Lab Findings: Lab Results  Component Value Date   WBC 5.0 11/07/2011   HGB 16.7* 03/18/2012   HCT 49.0* 03/18/2012   MCV 95.4 11/07/2011   PLT 281 11/07/2011    CMP     Component Value Date/Time   NA 140 03/18/2012 0741   K 3.3* 03/18/2012 0741   CL 106 03/18/2012 0741   CO2 26 11/07/2011 1514   GLUCOSE 90 03/18/2012 0741   BUN 24* 03/18/2012 0741   CREATININE 0.90 03/18/2012 0741   CALCIUM 9.3 11/07/2011 1514   PROT 8.7* 11/07/2011 1514   ALBUMIN 3.4* 11/07/2011 1514   AST 46* 11/07/2011 1514   ALT 41* 11/07/2011 1514   ALKPHOS 100 11/07/2011 1514   BILITOT 0.3 11/07/2011 1514      Radiographic Findings: Dg Chest Port 1 View  03/18/2012  *RADIOLOGY  REPORT*  Clinical Data: Preop  PORTABLE CHEST - 1 VIEW  Comparison: None.  Findings: Cardiac silhouette is prominent which may simply be due to AP semi recumbent technique.  Lungs are grossly clear.  No pneumothorax and no pleural effusion.  Unremarkable bony framework.  IMPRESSION: No active cardiopulmonary disease.  Prominent cardiac silhouette likely reflects technique.   Original Report Authenticated By: Donavan Burnet, M.D.     Impression/Plan:  Patient was discussed by phone with Dr. Dayton Scrape. Dr. Dayton Scrape would like the patient to be scheduled for CT simulation in early October. I was able to get the patient an appointment on October 7. I was also able to consent the patient today. I discussed the risks benefits and side effects of radiotherapy with the patient and her daughter. They understand that radiotherapy decreases the risk of an in breast  recurrence by about two thirds.  We discussed that radiation would take approximately 6-7 weeks to complete. This may be best performed in the prone position or alternatively with the 'breath-hold" technique to protect the heart. We spoke about acute effects including skin irritation and fatigue as well as much less common late effects including lung and heart irritation. We spoke about the latest technology that is used to minimize the risk of late effects for breast cancer patients undergoing radiotherapy. No guarantees of treatment were given. The patient is enthusiastic about proceeding with treatment.  _____________________________________   Lonie Peak, MD

## 2012-03-31 NOTE — Addendum Note (Signed)
Encounter addended by: Lowella Petties, RN on: 03/31/2012 10:37 AM<BR>     Documentation filed: Charges VN

## 2012-04-02 ENCOUNTER — Encounter (INDEPENDENT_AMBULATORY_CARE_PROVIDER_SITE_OTHER): Payer: Self-pay | Admitting: General Surgery

## 2012-04-02 ENCOUNTER — Ambulatory Visit (INDEPENDENT_AMBULATORY_CARE_PROVIDER_SITE_OTHER): Admitting: General Surgery

## 2012-04-02 VITALS — BP 132/70 | HR 60 | Temp 98.3°F | Resp 18 | Ht 62.0 in | Wt 121.4 lb

## 2012-04-02 DIAGNOSIS — C50919 Malignant neoplasm of unspecified site of unspecified female breast: Secondary | ICD-10-CM

## 2012-04-02 DIAGNOSIS — Z9889 Other specified postprocedural states: Secondary | ICD-10-CM

## 2012-04-02 DIAGNOSIS — C50419 Malignant neoplasm of upper-outer quadrant of unspecified female breast: Secondary | ICD-10-CM

## 2012-04-02 NOTE — Progress Notes (Signed)
History: Patient returns for a postop visit following reexcision of her invasive and in situ cancer with a history of lumpectomy previously and negative sentinel lymph node done out of town. She reports no problems postoperatively.  Exam: Breasts: Left breast lumpectomy site in the upper-outer quadrant is well healed without complication.  Pathology. We reviewed per pathology. She had 2 previous margins positive for DCIS, posterior and inferior. After reexcision there was DCIS 1 mm from the posterior margin however this was taken down to the chest wall. The inferior margin is now clear at 5 mm.  Assessment plan: Doing well following reexcision. Margins are clear surgically with the posterior margin being taken to the chest wall. We will get her back for radiation and medical oncology followup. I would plan to see her for long-term cancer followup in 6 months. All her questions were answered

## 2012-04-06 ENCOUNTER — Telehealth: Payer: Self-pay | Admitting: *Deleted

## 2012-04-06 ENCOUNTER — Other Ambulatory Visit: Payer: Self-pay | Admitting: Oncology

## 2012-04-06 NOTE — Telephone Encounter (Signed)
left voice message for son to call me and I will inform him of his mother's appointment with dr.khan

## 2012-04-13 ENCOUNTER — Ambulatory Visit
Admission: RE | Admit: 2012-04-13 | Discharge: 2012-04-13 | Disposition: A | Discharge status: Home / Self Care | Source: Ambulatory Visit | Attending: Radiation Oncology | Admitting: Radiation Oncology

## 2012-04-13 DIAGNOSIS — C50919 Malignant neoplasm of unspecified site of unspecified female breast: Secondary | ICD-10-CM

## 2012-04-13 NOTE — Progress Notes (Signed)
Simulation/treatment planning note: The patient underwent simulation/treatment planning in the management of her carcinoma of the left breast. Because of her large breast size we started out with treatment planning in the prone position. Her partial mastectomy scar was marked with a radiopaque wire. She was then scanned. Her tumor bed was against the chest wall to the cardiac silhouette. We therefore proceeded with placing her on a breast board with a custom neck mold for immobilization in the supine position. We perform scans with free breathing and deep inspiration breath-hold and noted that the cardiac silhouette significantly moved with deep inspiration. She was set up to medial lateral left breast tangents. I contoured the tumor bed on both the deep inspiration breath-hold scan and also free breathing scanned. 2 separate multileaf collimators were designed to conform the field and to avoid the heart. I'm prescribing 4500 cGy in 25 sessions utilizing mixed energy photons and this be followed by a photon boost to her tumor bed for an additional 1600 cGy in 8 sessions. Isodose plans and dosimetry are requested. She is now ready for 3-D simulation with dose volume histograms.

## 2012-04-20 ENCOUNTER — Ambulatory Visit
Admission: RE | Admit: 2012-04-20 | Discharge: 2012-04-20 | Disposition: A | Discharge status: Home / Self Care | Source: Ambulatory Visit | Attending: Radiation Oncology | Admitting: Radiation Oncology

## 2012-04-20 DIAGNOSIS — C50919 Malignant neoplasm of unspecified site of unspecified female breast: Secondary | ICD-10-CM

## 2012-04-20 NOTE — Progress Notes (Signed)
Simulation verification note: The patient underwent similar to verification for treatment to her left breast. Her isocenter is in good position and the multileaf collimators contoured the treatment volume appropriately. 

## 2012-04-21 ENCOUNTER — Ambulatory Visit
Admission: RE | Admit: 2012-04-21 | Discharge: 2012-04-21 | Disposition: A | Discharge status: Home / Self Care | Source: Ambulatory Visit | Attending: Radiation Oncology | Admitting: Radiation Oncology

## 2012-04-22 ENCOUNTER — Ambulatory Visit
Admission: RE | Admit: 2012-04-22 | Discharge: 2012-04-22 | Disposition: A | Discharge status: Home / Self Care | Source: Ambulatory Visit | Attending: Radiation Oncology | Admitting: Radiation Oncology

## 2012-04-22 DIAGNOSIS — C50419 Malignant neoplasm of upper-outer quadrant of unspecified female breast: Secondary | ICD-10-CM

## 2012-04-22 MED ORDER — RADIAPLEXRX EX GEL
Freq: Once | CUTANEOUS | Status: AC
  Administered 2012-04-22: 13:00:00 via TOPICAL

## 2012-04-22 MED ORDER — ALRA NON-METALLIC DEODORANT (RAD-ONC)
1.0000 "application " | Freq: Once | TOPICAL | Status: AC
  Administered 2012-04-22: 1 via TOPICAL

## 2012-04-22 NOTE — Progress Notes (Signed)
Post sim teaching,radiation therapy and you book, alra,radiaplexgel, given to patient teach back by patient  1:42 PM

## 2012-04-23 ENCOUNTER — Ambulatory Visit
Admission: RE | Admit: 2012-04-23 | Discharge: 2012-04-23 | Disposition: A | Discharge status: Home / Self Care | Source: Ambulatory Visit | Attending: Radiation Oncology | Admitting: Radiation Oncology

## 2012-04-24 ENCOUNTER — Ambulatory Visit
Admission: RE | Admit: 2012-04-24 | Discharge: 2012-04-24 | Disposition: A | Discharge status: Home / Self Care | Source: Ambulatory Visit | Attending: Radiation Oncology | Admitting: Radiation Oncology

## 2012-04-27 ENCOUNTER — Ambulatory Visit
Admission: RE | Admit: 2012-04-27 | Discharge: 2012-04-27 | Disposition: A | Discharge status: Home / Self Care | Source: Ambulatory Visit | Attending: Radiation Oncology | Admitting: Radiation Oncology

## 2012-04-27 ENCOUNTER — Encounter: Payer: Self-pay | Admitting: Radiation Oncology

## 2012-04-27 VITALS — BP 138/85 | HR 68 | Temp 98.5°F | Resp 20 | Wt 121.6 lb

## 2012-04-27 DIAGNOSIS — C50919 Malignant neoplasm of unspecified site of unspecified female breast: Secondary | ICD-10-CM

## 2012-04-27 NOTE — Progress Notes (Signed)
Patient  Here weekly rad txs left breast,5/33 completed, no skin changes noted so far, no c/o pain, informed patient per social worker Abigail, they do not have blood pressure machines, she may let her primaryMD who writes her medication for blood pressure know, maybe a rx from him could be covered by her insurance, will call [facility] about transportation issues, [place] no pain 12:15 PM

## 2012-04-27 NOTE — Progress Notes (Signed)
Weekly Management Note:  Site: Left breast Current Dose:  900  cGy Projected Dose: 6100  cGy  Narrative: The patient is seen today for routine under treatment assessment. CBCT/MVCT images/port films were reviewed. The chart was reviewed.   She is without complaints today. She is having transportation difficulties. She uses Radioplex gel.  Physical Examination:  Filed Vitals:   04/27/12 1211  BP: 138/85  Pulse: 68  Temp: 98.5 F (36.9 C)  Resp: 20  .  Weight: 121 lb 9.6 oz (55.157 kg). There were no significant skin changes along the left breast. No areas of desquamation.  Impression: Tolerating radiation therapy well.  Plan: Continue radiation therapy as planned.

## 2012-04-28 ENCOUNTER — Ambulatory Visit
Admission: RE | Admit: 2012-04-28 | Discharge: 2012-04-28 | Disposition: A | Discharge status: Home / Self Care | Source: Ambulatory Visit | Attending: Radiation Oncology | Admitting: Radiation Oncology

## 2012-04-29 ENCOUNTER — Ambulatory Visit
Admission: RE | Admit: 2012-04-29 | Discharge: 2012-04-29 | Disposition: A | Discharge status: Home / Self Care | Source: Ambulatory Visit | Attending: Radiation Oncology | Admitting: Radiation Oncology

## 2012-04-29 ENCOUNTER — Ambulatory Visit

## 2012-04-30 ENCOUNTER — Encounter: Payer: Self-pay | Admitting: Radiation Oncology

## 2012-04-30 ENCOUNTER — Ambulatory Visit

## 2012-04-30 ENCOUNTER — Ambulatory Visit
Admission: RE | Admit: 2012-04-30 | Discharge: 2012-04-30 | Disposition: A | Discharge status: Home / Self Care | Source: Ambulatory Visit | Attending: Radiation Oncology | Admitting: Radiation Oncology

## 2012-04-30 ENCOUNTER — Encounter: Payer: Self-pay | Admitting: *Deleted

## 2012-04-30 NOTE — Progress Notes (Signed)
Weekly Management Note:  Site: Left breast  Current Dose:  1440  cGy Projected Dose: 6100  cGy  Narrative: The patient is seen today for routine under treatment assessment. CBCT/MVCT images/port films were reviewed. The chart was reviewed.   No new complaints today. She uses Radioplex gel.  Physical Examination: There were no vitals filed for this visit..  Weight:  . There is slight hyperpigmentation the skin along the left breast. She appears to have a subcutaneous infection along the upper-outer quadrant of her right breast.  Impression: Tolerating radiation therapy well. She may need to go back to see Dr. Johna Sheriff for evaluation of her right breast if this does not clear up with antibiotic ointment.  Plan: Continue radiation therapy as planned.

## 2012-04-30 NOTE — Progress Notes (Signed)
Patient came in with Victoria Burke. She was here for radiation. I gave her an application for assistance. She will bring it back on tomorrow with bank statement and/or social security statement. I also called DSS and she has active medicaid from [place] county for years. We also gave her a bus pass to get back.

## 2012-04-30 NOTE — Progress Notes (Signed)
[facility] Clinical Social Work  Visual merchandiser met with pt at [employer] after she expressed concerns for transportation.  Pt stated she had Medicaid, but was unsure if it was active in [facility].  CSw provided pt with information on [employer] and application process for [facility] financial assistance.  CSW also connected pt with the financial counselor who confirmed pt's active medicaid and provided her with an EPP application.  Pt plans to return application information tomorrow.  Once pt is approved she will have access to financial assistance to transportation and medications.  CSW also informed pt that she would be eligible for Medicaid transportation.    Tamala Julian, MSW, LCSW Clinical Social Worker [facility] [phone]

## 2012-05-01 ENCOUNTER — Ambulatory Visit

## 2012-05-01 ENCOUNTER — Ambulatory Visit
Admission: RE | Admit: 2012-05-01 | Discharge: 2012-05-01 | Disposition: A | Discharge status: Home / Self Care | Source: Ambulatory Visit | Attending: Radiation Oncology | Admitting: Radiation Oncology

## 2012-05-04 ENCOUNTER — Ambulatory Visit

## 2012-05-04 ENCOUNTER — Ambulatory Visit
Admission: RE | Admit: 2012-05-04 | Discharge: 2012-05-04 | Disposition: A | Discharge status: Home / Self Care | Source: Ambulatory Visit | Attending: Radiation Oncology | Admitting: Radiation Oncology

## 2012-05-05 ENCOUNTER — Ambulatory Visit

## 2012-05-05 ENCOUNTER — Encounter: Payer: Self-pay | Admitting: Radiation Oncology

## 2012-05-05 ENCOUNTER — Ambulatory Visit
Admission: RE | Admit: 2012-05-05 | Discharge: 2012-05-05 | Disposition: A | Discharge status: Home / Self Care | Source: Ambulatory Visit | Attending: Radiation Oncology | Admitting: Radiation Oncology

## 2012-05-05 VITALS — BP 133/73 | HR 59 | Temp 97.6°F | Resp 20 | Wt 122.1 lb

## 2012-05-05 DIAGNOSIS — C50919 Malignant neoplasm of unspecified site of unspecified female breast: Secondary | ICD-10-CM

## 2012-05-05 NOTE — Progress Notes (Signed)
Weekly Management Note:  Site: Left breast Current Dose:  1980  cGy Projected Dose: 6100  cGy  Narrative: The patient is seen today for routine under treatment assessment. CBCT/MVCT images/port films were reviewed. The chart was reviewed.   She is without complaints today. She uses Radioplex gel.  Physical Examination:  Filed Vitals:   05/05/12 0918  BP: 133/73  Pulse: 59  Temp: 97.6 F (36.4 C)  Resp: 20  .  Weight: 122 lb 1.6 oz (55.384 kg). There is slight hyperpigmentation the skin along the left breast. No areas of desquamation.  Impression: Tolerating radiation therapy well.  Plan: Continue radiation therapy as planned.

## 2012-05-05 NOTE — Progress Notes (Signed)
Pt denies pain, fatigue, loss of appetite. Applying Radiaplex to L breast tx area.

## 2012-05-06 ENCOUNTER — Ambulatory Visit
Admission: RE | Admit: 2012-05-06 | Discharge: 2012-05-06 | Disposition: A | Discharge status: Home / Self Care | Source: Ambulatory Visit | Attending: Radiation Oncology | Admitting: Radiation Oncology

## 2012-05-06 ENCOUNTER — Ambulatory Visit

## 2012-05-07 ENCOUNTER — Ambulatory Visit
Admission: RE | Admit: 2012-05-07 | Discharge: 2012-05-07 | Disposition: A | Discharge status: Home / Self Care | Source: Ambulatory Visit | Attending: Radiation Oncology | Admitting: Radiation Oncology

## 2012-05-07 ENCOUNTER — Encounter: Payer: Self-pay | Admitting: Radiation Oncology

## 2012-05-07 ENCOUNTER — Ambulatory Visit

## 2012-05-07 NOTE — Progress Notes (Signed)
Called patient and had to leave message for a call back. I also noted for her to see me when she comes in for radiation. I need copy of her bank statement for me to complete her financial application.

## 2012-05-07 NOTE — Progress Notes (Signed)
Patient called me back and advise her I needed the bank statement she will bring on Monday

## 2012-05-08 ENCOUNTER — Ambulatory Visit
Admission: RE | Admit: 2012-05-08 | Discharge: 2012-05-08 | Disposition: A | Discharge status: Home / Self Care | Source: Ambulatory Visit | Attending: Radiation Oncology | Admitting: Radiation Oncology

## 2012-05-08 ENCOUNTER — Ambulatory Visit

## 2012-05-11 ENCOUNTER — Ambulatory Visit
Admission: RE | Admit: 2012-05-11 | Discharge: 2012-05-11 | Disposition: A | Discharge status: Home / Self Care | Source: Ambulatory Visit | Attending: Radiation Oncology | Admitting: Radiation Oncology

## 2012-05-11 ENCOUNTER — Ambulatory Visit

## 2012-05-11 ENCOUNTER — Encounter: Payer: Self-pay | Admitting: Radiation Oncology

## 2012-05-11 VITALS — BP 133/89 | HR 68 | Temp 97.8°F | Resp 20 | Wt 120.8 lb

## 2012-05-11 DIAGNOSIS — C50919 Malignant neoplasm of unspecified site of unspecified female breast: Secondary | ICD-10-CM

## 2012-05-11 NOTE — Progress Notes (Signed)
Weekly Management Note:  Site: Left Breast Current Dose:  2700  cGy Projected Dose: 6100  cGy  Narrative: The patient is seen today for routine under treatment assessment. CBCT/MVCT images/port films were reviewed. The chart was reviewed.   No new complaints today. She uses Radioplex gel.  Physical Examination:  Filed Vitals:   05/11/12 0908  BP: 133/89  Pulse: 68  Temp: 97.8 F (36.6 C)  Resp: 20  .  Weight: 120 lb 12.8 oz (54.795 kg). There is hyperpigmentation the skin. No areas of desquamation.  Impression: Tolerating radiation therapy well.  Plan: Continue radiation therapy as planned.

## 2012-05-11 NOTE — Progress Notes (Signed)
Patient here weekly rad tx completed 15/33 , tanning only left breast,skin intact, alert,oriented x3, occasional twinges in left breast,eating and drinking well, 9:10 AM

## 2012-05-12 ENCOUNTER — Encounter: Payer: Self-pay | Admitting: Oncology

## 2012-05-12 ENCOUNTER — Ambulatory Visit

## 2012-05-12 NOTE — Progress Notes (Signed)
Processed financial application for patient. 1 family -income 8520 per year. Sending letter/card to patient- 100% indigent 05/12/12-11/09/12.

## 2012-05-13 ENCOUNTER — Ambulatory Visit

## 2012-05-13 ENCOUNTER — Ambulatory Visit
Admission: RE | Admit: 2012-05-13 | Discharge: 2012-05-13 | Disposition: A | Discharge status: Home / Self Care | Source: Ambulatory Visit | Attending: Radiation Oncology | Admitting: Radiation Oncology

## 2012-05-14 ENCOUNTER — Ambulatory Visit
Admission: RE | Admit: 2012-05-14 | Discharge: 2012-05-14 | Disposition: A | Discharge status: Home / Self Care | Source: Ambulatory Visit | Attending: Radiation Oncology | Admitting: Radiation Oncology

## 2012-05-14 ENCOUNTER — Ambulatory Visit

## 2012-05-15 ENCOUNTER — Ambulatory Visit

## 2012-05-15 ENCOUNTER — Ambulatory Visit
Admission: RE | Admit: 2012-05-15 | Discharge: 2012-05-15 | Disposition: A | Discharge status: Home / Self Care | Source: Ambulatory Visit | Attending: Radiation Oncology | Admitting: Radiation Oncology

## 2012-05-15 ENCOUNTER — Encounter: Payer: Self-pay | Admitting: Radiation Oncology

## 2012-05-15 DIAGNOSIS — C50419 Malignant neoplasm of upper-outer quadrant of unspecified female breast: Secondary | ICD-10-CM

## 2012-05-15 MED ORDER — RADIAPLEXRX EX GEL
Freq: Once | CUTANEOUS | Status: AC
  Administered 2012-05-15: 1 via TOPICAL

## 2012-05-15 NOTE — Progress Notes (Signed)
Gave patient 50.00 gift card(via Victoria Burke). I advised her of [employer] and [facility] funds. She signed both forms that advise of one time and the amts- $600.00 and $400.00. After today she has balance $350.00 for the Gas card given. I did advise her she can only get 2 $50.00 gas cards-one time thing as well as the 2 grants. I advised her to leave the [facility] for meds if at all possible. She did say she has a bill she may want to pay with the [employer](Rent). She will bring to me on next week.

## 2012-05-18 ENCOUNTER — Ambulatory Visit
Admission: RE | Admit: 2012-05-18 | Discharge: 2012-05-18 | Disposition: A | Discharge status: Home / Self Care | Source: Ambulatory Visit | Attending: Radiation Oncology | Admitting: Radiation Oncology

## 2012-05-18 ENCOUNTER — Encounter: Payer: Self-pay | Admitting: Oncology

## 2012-05-18 ENCOUNTER — Encounter: Payer: Self-pay | Admitting: Radiation Oncology

## 2012-05-18 ENCOUNTER — Ambulatory Visit

## 2012-05-18 VITALS — BP 148/92 | HR 66 | Temp 97.7°F | Resp 20 | Wt 121.1 lb

## 2012-05-18 DIAGNOSIS — C50419 Malignant neoplasm of upper-outer quadrant of unspecified female breast: Secondary | ICD-10-CM | POA: Insufficient documentation

## 2012-05-18 DIAGNOSIS — C50919 Malignant neoplasm of unspecified site of unspecified female breast: Secondary | ICD-10-CM

## 2012-05-18 DIAGNOSIS — K219 Gastro-esophageal reflux disease without esophagitis: Secondary | ICD-10-CM | POA: Insufficient documentation

## 2012-05-18 DIAGNOSIS — Z79899 Other long term (current) drug therapy: Secondary | ICD-10-CM | POA: Insufficient documentation

## 2012-05-18 DIAGNOSIS — I10 Essential (primary) hypertension: Secondary | ICD-10-CM | POA: Insufficient documentation

## 2012-05-18 NOTE — Progress Notes (Signed)
Weekly Management Note:  Site: Left breast Current Dose:  3420  cGy Projected Dose: 6100  cGy  Narrative: The patient is seen today for routine under treatment assessment. CBCT/MVCT images/port films were reviewed. The chart was reviewed.   She still doing well although she does have "burning" along the inframammary region as expected. She uses Radioplex gel.  Physical Examination:  Filed Vitals:   05/18/12 0937  BP: 148/92  Pulse: 66  Temp: 97.7 F (36.5 C)  Resp: 20  .  Weight: 121 lb 1.6 oz (54.931 kg). There is marked hyperpigmentation the skin along the left breast particularly the inframammary region which may be beginning to desquamate.  Impression: Tolerating radiation therapy well, although she does have reduced dermatitis as expected. I expect her to desquamate sometime within the next week.  Plan: Continue radiation therapy as planned.

## 2012-05-18 NOTE — Progress Notes (Signed)
Patient came in and she bought her rental agreement to pay this month's rent 450.00. She has [employer] also. The bill is 165.66, but we can only pay 150.00 of it. She understands that is going to use all of her [employer] fund of 600.00. She understood and said she would have to pay the difference to [facility] payment of 165.66. I will call [employer] and let them know we will pay 150.00 of her bill.

## 2012-05-18 NOTE — Progress Notes (Signed)
patient here weekly radiation treatments, 19/33 completed, alert,oriented x3, left breast hyperpigmentation ,skin intact,no desquamation, patient using radiaplex 1-2 x day, states"it burns under inframmary breat every now and then," 9:37 AM

## 2012-05-18 NOTE — Progress Notes (Signed)
Called [employer] and spoke with [place] for commitment amount of 150.00. I did advise her of 2-3 weeks for payment for this patient.

## 2012-05-18 NOTE — Addendum Note (Signed)
Encounter addended by: Lowella Petties, RN on: 05/18/2012  9:53 AM<BR>     Documentation filed: Inpatient Document Flowsheet

## 2012-05-19 ENCOUNTER — Ambulatory Visit

## 2012-05-19 ENCOUNTER — Ambulatory Visit
Admission: RE | Admit: 2012-05-19 | Discharge: 2012-05-19 | Disposition: A | Discharge status: Home / Self Care | Source: Ambulatory Visit | Attending: Radiation Oncology | Admitting: Radiation Oncology

## 2012-05-20 ENCOUNTER — Ambulatory Visit

## 2012-05-20 ENCOUNTER — Ambulatory Visit
Admission: RE | Admit: 2012-05-20 | Discharge: 2012-05-20 | Disposition: A | Discharge status: Home / Self Care | Source: Ambulatory Visit | Attending: Radiation Oncology | Admitting: Radiation Oncology

## 2012-05-21 ENCOUNTER — Ambulatory Visit
Admission: RE | Admit: 2012-05-21 | Discharge: 2012-05-21 | Disposition: A | Discharge status: Home / Self Care | Source: Ambulatory Visit | Attending: Radiation Oncology | Admitting: Radiation Oncology

## 2012-05-21 ENCOUNTER — Ambulatory Visit

## 2012-05-21 ENCOUNTER — Encounter: Payer: Self-pay | Admitting: Radiation Oncology

## 2012-05-21 NOTE — Progress Notes (Signed)
Patient came to nursing asking to check under her left breast fold, noticed slight peeling less than a fingernail , gave neosporin and telfa dressing to site, with extra supplies, as Md is off today and tomorrow asked if she needed to see MD or did she want to wait till Monday and see Dr.Murray on normal day, no c/o pain, she said she would wait till Monday, offered her to come see on call MD tomorrow if area of skin peeling more or in pain, she said no 'Ican wait till MOnday, 9:12 AM

## 2012-05-21 NOTE — Progress Notes (Signed)
Complex simulation note: The patient underwent virtual simulation for her left breast boost. She was set up to 3 fields, RAO, LAO, and LPO utilizing 6 MV and 10 MV photons. 3 separate multileaf collimators are designed to conform the field. An isodose plan is reviewed and excepted. I prescribing 1600 cGy in 8 sessions.

## 2012-05-22 ENCOUNTER — Ambulatory Visit

## 2012-05-22 ENCOUNTER — Ambulatory Visit
Admission: RE | Admit: 2012-05-22 | Discharge: 2012-05-22 | Disposition: A | Discharge status: Home / Self Care | Source: Ambulatory Visit | Attending: Radiation Oncology | Admitting: Radiation Oncology

## 2012-05-25 ENCOUNTER — Ambulatory Visit
Admission: RE | Admit: 2012-05-25 | Discharge: 2012-05-25 | Disposition: A | Discharge status: Home / Self Care | Source: Ambulatory Visit | Attending: Radiation Oncology | Admitting: Radiation Oncology

## 2012-05-25 ENCOUNTER — Encounter: Payer: Self-pay | Admitting: Radiation Oncology

## 2012-05-25 ENCOUNTER — Ambulatory Visit

## 2012-05-25 VITALS — BP 147/90 | HR 76 | Temp 98.4°F | Resp 20 | Wt 121.1 lb

## 2012-05-25 DIAGNOSIS — C50919 Malignant neoplasm of unspecified site of unspecified female breast: Secondary | ICD-10-CM

## 2012-05-25 NOTE — Progress Notes (Signed)
Patient here weekly rad txs, left breast:24/33 completed, hyperpigmentation on breast, thinning dry skin on top of left breast,under infrmammary breast,peeling, and looks like s small blister 3-4cm, patient using neosporin under breast fold, radiaplex gel on top of breat, c/o soreness when taking a shower, 9:25 AM

## 2012-05-25 NOTE — Progress Notes (Signed)
Weekly Management Note:  Site: Left breast Current Dose:  4320  cGy Projected Dose: 6100  CGy including left breast boost  Narrative: The patient is seen today for routine under treatment assessment. CBCT/MVCT images/port films were reviewed. The chart was reviewed.   No new complaints today. She uses Radioplex gel in addition to antibiotic ointment along the left inframammary fold where she has a focal moist desquamation.  Physical Examination:  Filed Vitals:   05/25/12 0925  BP: 147/90  Pulse: 76  Temp: 98.4 F (36.9 C)  Resp: 20  .  Weight: 121 lb 1.6 oz (54.931 kg). There is marked hyperpigmentation the skin the left breast with patchy dry desquamation and a 3-4 cm area of linear moist desquamation along the inframammary region.  Impression: Tolerating radiation therapy well. Continue with antibiotic ointment and Radioplex gel.  Plan: Continue radiation therapy as planned.

## 2012-05-26 ENCOUNTER — Encounter: Payer: Self-pay | Admitting: Oncology

## 2012-05-26 ENCOUNTER — Ambulatory Visit
Admission: RE | Admit: 2012-05-26 | Discharge: 2012-05-26 | Disposition: A | Discharge status: Home / Self Care | Source: Ambulatory Visit | Attending: Radiation Oncology | Admitting: Radiation Oncology

## 2012-05-26 ENCOUNTER — Ambulatory Visit

## 2012-05-26 NOTE — Progress Notes (Signed)
Recd check request from [place]. 150.00 was paid for [employer] --approved 05/25/12 -- Called and left message for patient. We need tax id([id number]) from [employer] for her rent to be paid. I have the form for patient to take to landlord to get back to me so I can submit for her rent 450.00.

## 2012-05-27 ENCOUNTER — Ambulatory Visit

## 2012-05-27 ENCOUNTER — Encounter: Payer: Self-pay | Admitting: Radiation Oncology

## 2012-05-27 ENCOUNTER — Ambulatory Visit
Admission: RE | Admit: 2012-05-27 | Discharge: 2012-05-27 | Disposition: A | Discharge status: Home / Self Care | Source: Ambulatory Visit | Attending: Radiation Oncology | Admitting: Radiation Oncology

## 2012-05-27 NOTE — Progress Notes (Signed)
Simulation verification note: Victoria Burke underwent simulation verification for her left breast boost. Her isocenter is in good position and the multileaf collimators contoured the treatment volume appropriately.

## 2012-05-28 ENCOUNTER — Ambulatory Visit
Admission: RE | Admit: 2012-05-28 | Discharge: 2012-05-28 | Disposition: A | Discharge status: Home / Self Care | Source: Ambulatory Visit | Attending: Radiation Oncology | Admitting: Radiation Oncology

## 2012-05-28 ENCOUNTER — Ambulatory Visit

## 2012-05-29 ENCOUNTER — Ambulatory Visit (HOSPITAL_BASED_OUTPATIENT_CLINIC_OR_DEPARTMENT_OTHER): Admitting: Oncology

## 2012-05-29 ENCOUNTER — Ambulatory Visit

## 2012-05-29 ENCOUNTER — Telehealth: Payer: Self-pay | Admitting: Oncology

## 2012-05-29 ENCOUNTER — Encounter: Payer: Self-pay | Admitting: Oncology

## 2012-05-29 ENCOUNTER — Ambulatory Visit
Admission: RE | Admit: 2012-05-29 | Discharge: 2012-05-29 | Disposition: A | Discharge status: Home / Self Care | Source: Ambulatory Visit | Attending: Radiation Oncology | Admitting: Radiation Oncology

## 2012-05-29 VITALS — BP 132/75 | HR 73 | Temp 98.5°F | Resp 20 | Ht 62.0 in | Wt 123.6 lb

## 2012-05-29 DIAGNOSIS — C50419 Malignant neoplasm of upper-outer quadrant of unspecified female breast: Secondary | ICD-10-CM

## 2012-05-29 DIAGNOSIS — Z170 Estrogen receptor positive status [ER+]: Secondary | ICD-10-CM

## 2012-05-29 NOTE — Patient Instructions (Addendum)
Continue radiation  I will see you back in 3 weeks for follow up

## 2012-05-29 NOTE — Telephone Encounter (Signed)
gve the pt her dec 2013 appt calendar °
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
## 2012-05-30 ENCOUNTER — Ambulatory Visit

## 2012-06-01 ENCOUNTER — Ambulatory Visit
Admission: RE | Admit: 2012-06-01 | Discharge: 2012-06-01 | Disposition: A | Discharge status: Home / Self Care | Source: Ambulatory Visit | Attending: Radiation Oncology | Admitting: Radiation Oncology

## 2012-06-01 ENCOUNTER — Ambulatory Visit

## 2012-06-01 ENCOUNTER — Encounter: Payer: Self-pay | Admitting: Radiation Oncology

## 2012-06-01 VITALS — BP 127/75 | HR 80 | Temp 98.0°F | Resp 20 | Wt 122.4 lb

## 2012-06-01 DIAGNOSIS — C50419 Malignant neoplasm of upper-outer quadrant of unspecified female breast: Secondary | ICD-10-CM

## 2012-06-01 NOTE — Progress Notes (Signed)
Patient here weekly radiation treatments: left breast =9/33 completed, on her boost, alert,oriented x3, peeling under left axilla, dry and also under inframmary fold of left breast, ,pateint uing radiaplex gel, states"burning in those areas, will give gel pads, to use, sharp apins occasionally left breast resolves quickly stated May need neosporin in peeled areas of her skin will ask MD 9:13 AM

## 2012-06-01 NOTE — Progress Notes (Signed)
Weekly Management Note:  Site:L Breast boost Current Dose:  5300  cGy Projected Dose: 6100  cGy  Narrative: The patient is seen today for routine under treatment assessment. CBCT/MVCT images/port films were reviewed. The chart was reviewed.   She continues to have discomfort along the left inframammary area and axilla which she has moist desquamation. She has been using Radioplex gel along with Neosporin to the areas of moist desquamation.  Physical Examination:  Filed Vitals:   06/01/12 0913  BP: 127/75  Pulse: 80  Temp: 98 F (36.7 C)  Resp: 20  .  Weight: 122 lb 6.4 oz (55.52 kg). There is moist desquamation along the left inframammary region and axilla. There is marked hyperpigmentation of the breast with other areas of dry desquamation.  Impression: Tolerating radiation therapy well, although she does have areas of moist desquamation.  Plan: Continue radiation therapy as planned. She will finish next week with the Thanksgiving holiday later this week.

## 2012-06-02 ENCOUNTER — Ambulatory Visit

## 2012-06-02 ENCOUNTER — Ambulatory Visit
Admission: RE | Admit: 2012-06-02 | Discharge: 2012-06-02 | Disposition: A | Discharge status: Home / Self Care | Source: Ambulatory Visit | Attending: Radiation Oncology | Admitting: Radiation Oncology

## 2012-06-03 ENCOUNTER — Ambulatory Visit
Admission: RE | Admit: 2012-06-03 | Discharge: 2012-06-03 | Disposition: A | Discharge status: Home / Self Care | Source: Ambulatory Visit | Attending: Radiation Oncology | Admitting: Radiation Oncology

## 2012-06-03 ENCOUNTER — Ambulatory Visit

## 2012-06-08 ENCOUNTER — Ambulatory Visit
Admission: RE | Admit: 2012-06-08 | Discharge: 2012-06-08 | Disposition: A | Discharge status: Home / Self Care | Source: Ambulatory Visit | Attending: Radiation Oncology | Admitting: Radiation Oncology

## 2012-06-08 ENCOUNTER — Ambulatory Visit

## 2012-06-08 ENCOUNTER — Encounter: Payer: Self-pay | Admitting: Oncology

## 2012-06-08 ENCOUNTER — Encounter: Payer: Self-pay | Admitting: Radiation Oncology

## 2012-06-08 VITALS — BP 154/93 | HR 82 | Temp 98.0°F | Resp 20 | Wt 119.7 lb

## 2012-06-08 DIAGNOSIS — C50419 Malignant neoplasm of upper-outer quadrant of unspecified female breast: Secondary | ICD-10-CM

## 2012-06-08 NOTE — Progress Notes (Signed)
Patient here weekly rad txs<31/tx so far tomorrow her last day, skin is peeling under left axilla arm and down left breast und under inframmary fold, dry desquamation, hyperpigmentation, radioplex gel, and neosporin on affected area of skin , gave 1 month f/u appt card, not using hydrogel pads says "too cold", hurts with movement if clothes /pillows touch area of breast 9:28 AM

## 2012-06-08 NOTE — Progress Notes (Signed)
Patient received 2 bus passes today- 3.40 from [facility] fund. She wanted for her cousin, and I advised her they are only for patients.

## 2012-06-08 NOTE — Progress Notes (Signed)
Weekly Management Note:  Site: Left breast Current Dose:  5900  cGy Projected Dose: 6100  cGy  Narrative: The patient is seen today for routine under treatment assessment. CBCT/MVCT images/port films were reviewed. The chart was reviewed.   Her left inframammary breast and axillary pain is improved. She has been using antibiotic ointment. She finishes her radiation therapy tomorrow.  Physical Examination:  Filed Vitals:   06/08/12 0923  BP: 154/93  Pulse: 82  Temp: 98 F (36.7 C)  Resp: 20  .  Weight: 119 lb 11.2 oz (54.296 kg). There is marked hyperpigmentation of the skin the left breast with extensive dry desquamation. There is no residual moist desquamation along the axilla or inframammary region.  Impression: Tolerating radiation therapy well, with exception of desquamation secondary to her anatomy.  Plan: Continue radiation therapy as planned. She will finish her radiation therapy tomorrow and return to see me for a followup visit in one month.

## 2012-06-09 ENCOUNTER — Ambulatory Visit

## 2012-06-09 ENCOUNTER — Ambulatory Visit
Admission: RE | Admit: 2012-06-09 | Discharge: 2012-06-09 | Disposition: A | Discharge status: Home / Self Care | Source: Ambulatory Visit | Attending: Radiation Oncology | Admitting: Radiation Oncology

## 2012-06-10 ENCOUNTER — Encounter: Payer: Self-pay | Admitting: Radiation Oncology

## 2012-06-10 NOTE — Progress Notes (Signed)
[facility] Radiation Oncology End of Treatment Note  Name:Victoria Burke  Date: 06/10/2012 YNW:[id number] DOB:[birth year]   Status:outpatient    CC: Jackie Plum, MD  , Dr. Drue Second, Dr. Jaclynn Guarneri  REFERRING PHYSICIAN: Dr. Drue Second   DIAGNOSIS:  Pathologic Stage T2 N0 invasive ductal carcinoma/DCIS of the left breast  INDICATION FOR TREATMENT: Curative   TREATMENT DATES: 04/21/2012 through 06/09/2012                          SITE/DOSE:  Left breast 4500 cGy 25 sessions with deep inspiration and breath-hold to avoid cardiac irradiation. Left breast boost 1600 cGy 8 sessions                          BEAMS/ENERGY:   6 MV/10 MV photons tangential fields to the left breast. Mixed 6 MV and 10 MV photons 3 field left breast boost                NARRATIVE: The patient tolerated her treatment reasonably well considering the size of her breast and skin folds. As expected, she developed extensive marked hyperpigmentation the skin along with extensive dry desquamation along with focal moist desquamation along the inframammary region and axilla which was treated with antibiotic ointment.                           PLAN: Routine followup in one month. Patient instructed to call if questions or worsening complaints in interim.

## 2012-06-10 NOTE — Progress Notes (Signed)
3-D simulation note:  The patient underwent 3-D simulation in the management of her carcinoma of the left breast. She is being treated with deep inspiration/breath-hold to avoid cardiac irradiation. She underwent dose volume histograms for the lungs and heart in addition to target structures. She was set up to tangential fields. 2 sets of multileaf collimators were constructed to conform the field. I prescribed 4500 centigrade 25 sessions utilizing 6 MV and 10 MV photons.

## 2012-06-19 ENCOUNTER — Ambulatory Visit: Admitting: Adult Health

## 2012-06-19 ENCOUNTER — Other Ambulatory Visit: Admitting: Lab

## 2012-06-24 ENCOUNTER — Encounter: Payer: Self-pay | Admitting: *Deleted

## 2012-06-24 NOTE — Progress Notes (Signed)
Faxed Letter of Medical Necessity to [facility].

## 2012-07-16 ENCOUNTER — Emergency Department (HOSPITAL_COMMUNITY)
Admission: EM | Admit: 2012-07-16 | Discharge: 2012-07-16 | Disposition: A | Discharge status: Home / Self Care | Attending: Emergency Medicine | Admitting: Emergency Medicine

## 2012-07-16 ENCOUNTER — Encounter (HOSPITAL_COMMUNITY): Payer: Self-pay | Admitting: *Deleted

## 2012-07-16 DIAGNOSIS — E785 Hyperlipidemia, unspecified: Secondary | ICD-10-CM | POA: Insufficient documentation

## 2012-07-16 DIAGNOSIS — Z8719 Personal history of other diseases of the digestive system: Secondary | ICD-10-CM | POA: Insufficient documentation

## 2012-07-16 DIAGNOSIS — F17200 Nicotine dependence, unspecified, uncomplicated: Secondary | ICD-10-CM | POA: Insufficient documentation

## 2012-07-16 DIAGNOSIS — F3289 Other specified depressive episodes: Secondary | ICD-10-CM | POA: Insufficient documentation

## 2012-07-16 DIAGNOSIS — Z79899 Other long term (current) drug therapy: Secondary | ICD-10-CM | POA: Insufficient documentation

## 2012-07-16 DIAGNOSIS — Z853 Personal history of malignant neoplasm of breast: Secondary | ICD-10-CM | POA: Insufficient documentation

## 2012-07-16 DIAGNOSIS — Z87828 Personal history of other (healed) physical injury and trauma: Secondary | ICD-10-CM | POA: Insufficient documentation

## 2012-07-16 DIAGNOSIS — Z8739 Personal history of other diseases of the musculoskeletal system and connective tissue: Secondary | ICD-10-CM | POA: Insufficient documentation

## 2012-07-16 DIAGNOSIS — N390 Urinary tract infection, site not specified: Secondary | ICD-10-CM | POA: Insufficient documentation

## 2012-07-16 DIAGNOSIS — Z862 Personal history of diseases of the blood and blood-forming organs and certain disorders involving the immune mechanism: Secondary | ICD-10-CM | POA: Insufficient documentation

## 2012-07-16 DIAGNOSIS — F329 Major depressive disorder, single episode, unspecified: Secondary | ICD-10-CM | POA: Insufficient documentation

## 2012-07-16 DIAGNOSIS — I10 Essential (primary) hypertension: Secondary | ICD-10-CM | POA: Insufficient documentation

## 2012-07-16 LAB — URINALYSIS, ROUTINE W REFLEX MICROSCOPIC
Bilirubin Urine: NEGATIVE
Nitrite: NEGATIVE
Specific Gravity, Urine: 1.019 (ref 1.005–1.030)
Urobilinogen, UA: 1 mg/dL (ref 0.0–1.0)
pH: 6.5 (ref 5.0–8.0)

## 2012-07-16 LAB — URINE MICROSCOPIC-ADD ON

## 2012-07-16 MED ORDER — CEPHALEXIN 500 MG PO CAPS: 500.0000 mg | ORAL_CAPSULE | Freq: Four times a day (QID) | ORAL | Status: DC

## 2012-07-16 NOTE — ED Notes (Signed)
Pt c/o blood in urine since this morning.  Denies painful urination or abdominal pain.  No n/v/d.

## 2012-07-16 NOTE — Discharge Instructions (Signed)
Take antibiotic to completion. Return with worsening symptoms. Follow up with your doctor in 1 week.
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
## 2012-07-17 NOTE — ED Provider Notes (Signed)
Medical screening examination/treatment/procedure(s) were performed by non-physician practitioner and as supervising physician I was immediately available for consultation/collaboration.   Carleene Cooper III, MD 07/17/12 [phone]

## 2012-07-20 ENCOUNTER — Encounter: Payer: Self-pay | Admitting: Radiation Oncology

## 2012-07-21 ENCOUNTER — Ambulatory Visit
Admission: RE | Admit: 2012-07-21 | Discharge: 2012-07-21 | Disposition: A | Discharge status: Home / Self Care | Source: Ambulatory Visit | Attending: Radiation Oncology | Admitting: Radiation Oncology

## 2012-07-21 VITALS — BP 125/81 | HR 71 | Temp 97.7°F | Resp 20 | Wt 122.2 lb

## 2012-07-21 DIAGNOSIS — C50419 Malignant neoplasm of upper-outer quadrant of unspecified female breast: Secondary | ICD-10-CM

## 2012-07-21 HISTORY — DX: Personal history of irradiation: Z92.3

## 2012-07-21 NOTE — Progress Notes (Addendum)
CC: Dr. Drue Second, Dr. 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 She had her left breast excision done here by Dr. Johna Sheriff. Her tumor was hormone receptor positive, and it was anticipated that she would start adjuvant tamoxifen following completion of her radiation therapy. She was also seen in consultation by Dr. Welton Flakes. She apparently missed an appointment to see Dr. Welton Flakes or her PA this past December. She does not recall having anyone called her to reschedule her appointment. She is on Keflex for a recent urinary tract infection. She does not have an appointment to see Dr. Johna Sheriff.  Physical examination: Alert and oriented 62 year old after American female appearing her stated age. Wt Readings from Last 3 Encounters:  07/21/12 122 lb 3.2 oz (55.43 kg)  06/08/12 119 lb 11.2 oz (54.296 kg)  06/01/12 122 lb 6.4 oz (55.52 kg)   Temp Readings from Last 3 Encounters:  07/21/12 97.7 F (36.5 C) Oral  07/16/12 98.6 F (37 C) Oral  06/08/12 98 F (36.7 C) Oral   BP Readings from Last 3 Encounters:  07/21/12 125/81  07/16/12 156/72  06/08/12 154/93   Pulse Readings from Last 3 Encounters:  07/21/12 71  07/16/12 72  06/08/12 82   Head and neck examination: Grossly unremarkable . Nodes: Without palpable cervical, supraclavicular, or axillary lymphadenopathy. Chest: Lungs clear. Breasts: There is residual hyperpigmentation the skin along the left breast. No dominant masses are appreciated. Minimal thickening of the left breast. Right breast without masses or lesions. Abdomen without hepatomegaly. Extremities: Without edema.  Impression: Satisfactory progress. We'll reschedule her to see Dr. Welton Flakes her PA for a followup visit and initiation of adjuvant tamoxifen.  Plan: Followup visit with Dr. Welton Flakes.  I will make an appointment for her to see me in 6 months just to make sure that she complies with her followup visit with Dr. Welton Flakes.

## 2012-07-21 NOTE — Progress Notes (Signed)
Patient here for follow up s/p rad tx left breast  04/21/12-06/09/12 Alert,oriented x3, left breast with slight hyperpigemention, skin intact, well healed, new medication Kephlex 500mg  1 po 4x day for UTI, written 07/17/11, no dysuria Using regular lotion on skin 10:41 AM

## 2012-07-31 ENCOUNTER — Telehealth (INDEPENDENT_AMBULATORY_CARE_PROVIDER_SITE_OTHER): Payer: Self-pay

## 2012-07-31 NOTE — Telephone Encounter (Signed)
Message copied by Maryan Puls on Fri Jul 31, 2012  5:32 PM ------      Message from: Zacarias Pontes      Created: Fri Jul 31, 2012  2:51 PM       Pt call pts son back concerning his mom.He says that his mom is suppose to be taking radiation pills ? And he isnt sure of who is suppose to be giving them to her.his number is [phone] his name is Reatha Harps

## 2012-07-31 NOTE — Telephone Encounter (Signed)
Advised patient son Reatha Harps that he will need to contact the Oncologist to answer his questions or concerns regarding her radiation treatment.  Son understood and will contact the Oncologist.

## 2012-08-06 ENCOUNTER — Encounter: Payer: Self-pay | Admitting: Oncology

## 2012-08-06 NOTE — Progress Notes (Signed)
Sent request to Victoria Burke to pay patient's water bill 94.19(done) and rent of $250.00. Both [employer] and [facility] funds have been depleted.

## 2012-08-26 ENCOUNTER — Telehealth: Payer: Self-pay | Admitting: Medical Oncology

## 2012-08-26 DIAGNOSIS — C50419 Malignant neoplasm of upper-outer quadrant of unspecified female breast: Secondary | ICD-10-CM

## 2012-08-26 DIAGNOSIS — C50919 Malignant neoplasm of unspecified site of unspecified female breast: Secondary | ICD-10-CM

## 2012-08-26 NOTE — Telephone Encounter (Signed)
Patient can be seen by [place] at 10:00 am on 08/27/12

## 2012-08-26 NOTE — Telephone Encounter (Signed)
Per MD, onc treatment sent for patient to be scheduled for MD appt 02/20 @ 2pm. LVMOM with son, Victoria Burke, regarding this appt and that our scheduling dept will call him to confirm this appt. Son to call office should he have any further questions or concerns.

## 2012-08-26 NOTE — Telephone Encounter (Signed)
Pt's son called stating patient finished radiation treatment 06/09/12 and patient was supposed to start treatment after radiation was completed. Son concerned that patient hasn't started treatment yet. Last MD appt 05/29/12, no current sched appts. Explained to son will review concerns with MD and call him back with update.

## 2012-08-27 ENCOUNTER — Encounter: Payer: Self-pay | Admitting: Oncology

## 2012-08-27 ENCOUNTER — Telehealth: Payer: Self-pay | Admitting: *Deleted

## 2012-08-27 ENCOUNTER — Ambulatory Visit: Admitting: Lab

## 2012-08-27 ENCOUNTER — Telehealth: Payer: Self-pay | Admitting: Oncology

## 2012-08-27 ENCOUNTER — Ambulatory Visit (HOSPITAL_BASED_OUTPATIENT_CLINIC_OR_DEPARTMENT_OTHER): Admitting: Oncology

## 2012-08-27 VITALS — BP 136/80 | HR 73 | Temp 98.4°F | Resp 20 | Ht 62.0 in | Wt 126.7 lb

## 2012-08-27 DIAGNOSIS — C50419 Malignant neoplasm of upper-outer quadrant of unspecified female breast: Secondary | ICD-10-CM

## 2012-08-27 DIAGNOSIS — Z79811 Long term (current) use of aromatase inhibitors: Secondary | ICD-10-CM

## 2012-08-27 DIAGNOSIS — Z170 Estrogen receptor positive status [ER+]: Secondary | ICD-10-CM

## 2012-08-27 DIAGNOSIS — C50919 Malignant neoplasm of unspecified site of unspecified female breast: Secondary | ICD-10-CM

## 2012-08-27 MED ORDER — ANASTROZOLE 1 MG PO TABS: 1.0000 mg | ORAL_TABLET | Freq: Every day | ORAL | Status: AC

## 2012-08-27 NOTE — Telephone Encounter (Signed)
Received call from pt's son who states his mother has not received medication she was to take following completion of radiation treatments. He states he called for FU appt, but was told pt does not have FU appt w/med onc. Per Denny Levy, RN's note dated 08/26/12 pt has appt today 2 pm. Informed pt's son about todays appt. He states he will call his mother and let her know. This RN spoke w/M Leonetti, Charity fundraiser and informed her pt's appt is not in Epic. She stated she would call pt and confirm her appt today w/Dr Welton Flakes.

## 2012-08-27 NOTE — Telephone Encounter (Signed)
gv pt appt schedule for May and bone density test for 2/26 to arrive @ 2:45pm at the [facility].

## 2012-08-27 NOTE — Telephone Encounter (Signed)
S/w pt son re appt today @ 2:30pm. Son very upset about just receiving a call re his moms appt. Son inform I would ask director to call him.

## 2012-08-27 NOTE — Patient Instructions (Addendum)
Proceed with arimidex (anastrozole) 1 mg daily to help prevent breast cancer from coming back in other parts of your body  We will get a bone density scan to check your bones for osteoporosis  Take vitamin D3 1000 iu daily and calcium 1200 mg daily  I will see you back in 3 months for follow up  Anastrozole tablets What is this medicine? ANASTROZOLE (an AS troe zole) is used to treat breast cancer in women who have gone through menopause. Some types of breast cancer depend on estrogen to grow, and this medicine can stop tumor growth by blocking estrogen production. This medicine may be used for other purposes; ask your health care provider or pharmacist if you have questions. What should I tell my health care provider before I take this medicine? They need to know if you have any of these conditions: -liver disease -an unusual or allergic reaction to anastrozole, other medicines, foods, dyes, or preservatives -pregnant or trying to get pregnant -breast-feeding How should I use this medicine? Take this medicine by mouth with a glass of water. Follow the directions on the prescription label. You can take this medicine with or without food. Take your doses at regular intervals. Do not take your medicine more often than directed. Do not stop taking except on the advice of your doctor or health care professional. Talk to your pediatrician regarding the use of this medicine in children. Special care may be needed. Overdosage: If you think you have taken too much of this medicine contact a poison control center or emergency room at once. NOTE: This medicine is only for you. Do not share this medicine with others. What if I miss a dose? If you miss a dose, take it as soon as you can. If it is almost time for your next dose, take only that dose. Do not take double or extra doses. What may interact with this medicine? Do not take this medicine with any of the following medications: -female hormones,  like estrogens or progestins and birth control pills This medicine may also interact with the following medications: -tamoxifen This list may not describe all possible interactions. Give your health care provider a list of all the medicines, herbs, non-prescription drugs, or dietary supplements you use. Also tell them if you smoke, drink alcohol, or use illegal drugs. Some items may interact with your medicine. What should I watch for while using this medicine? Visit your doctor or health care professional for regular checks on your progress. Let your doctor or health care professional know about any unusual vaginal bleeding. Do not treat yourself for diarrhea, nausea, vomiting or other side effects. Ask your doctor or health care professional for advice. What side effects may I notice from receiving this medicine? Side effects that you should report to your doctor or health care professional as soon as possible: -allergic reactions like skin rash, itching or hives, swelling of the face, lips, or tongue -any new or unusual symptoms -breathing problems -chest pain -leg pain or swelling -vomiting Side effects that usually do not require medical attention (report to your doctor or health care professional if they continue or are bothersome): -back or bone pain -cough, or throat infection -diarrhea or constipation -dizziness -headache -hot flashes -loss of appetite -nausea -sweating -weakness and tiredness -weight gain This list may not describe all possible side effects. Call your doctor for medical advice about side effects. You may report side effects to FDA at 1-800-FDA-1088. Where should I keep my medicine?  Keep out of the reach of children. Store at room temperature between 20 and 25 degrees C (68 and 77 degrees F). Throw away any unused medicine after the expiration date. NOTE: This sheet is a summary. It may not cover all possible information. If you have questions about this  medicine, talk to your doctor, pharmacist, or health care provider.  2012, [employer]. (09/04/2007 4:31:52 PM)

## 2012-08-28 ENCOUNTER — Telehealth: Payer: Self-pay | Admitting: Oncology

## 2012-08-28 NOTE — Telephone Encounter (Signed)
Prior to leaving yesterday I confirmed pt's contact info and info listed was not correct. Pt home phone changed to [phone]. No other numbers available. The information for pt's son remains the same Reatha Harps).

## 2012-09-02 ENCOUNTER — Ambulatory Visit
Admission: RE | Admit: 2012-09-02 | Discharge: 2012-09-02 | Disposition: A | Discharge status: Home / Self Care | Source: Ambulatory Visit | Attending: Oncology | Admitting: Oncology

## 2012-09-02 DIAGNOSIS — C50419 Malignant neoplasm of upper-outer quadrant of unspecified female breast: Secondary | ICD-10-CM

## 2012-09-02 DIAGNOSIS — Z79811 Long term (current) use of aromatase inhibitors: Secondary | ICD-10-CM
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
## 2012-10-02 ENCOUNTER — Encounter (INDEPENDENT_AMBULATORY_CARE_PROVIDER_SITE_OTHER): Admitting: General Surgery

## 2012-10-14 ENCOUNTER — Encounter: Payer: Self-pay | Admitting: Oncology

## 2012-10-14 NOTE — Progress Notes (Signed)
Patient had left message about last rent that was paid. I called and I am sending her the copy that it was paid.

## 2012-11-23 ENCOUNTER — Encounter: Payer: Self-pay | Admitting: Oncology

## 2012-11-23 NOTE — Progress Notes (Signed)
Herbert Seta called and left a message about rental person calling for Victoria Burke. She is aware she only had funds to pay 250.00 and that was it.I called Mrs. Marks and left her a message to call me back. I also tried calling Victoria Burke and Victoria phone is disconnected. Victoria Burke is aware she only had 250.00 left and that depleted her funds.

## 2012-12-02 ENCOUNTER — Other Ambulatory Visit: Admitting: Lab

## 2012-12-02 ENCOUNTER — Ambulatory Visit: Admitting: Oncology

## 2012-12-22 ENCOUNTER — Encounter (HOSPITAL_COMMUNITY): Payer: Self-pay | Admitting: Emergency Medicine

## 2012-12-22 ENCOUNTER — Inpatient Hospital Stay (HOSPITAL_COMMUNITY)
Admission: EM | Admit: 2012-12-22 | Discharge: 2012-12-25 | DRG: 392 | Disposition: A | Discharge status: Home / Self Care | Attending: Internal Medicine | Admitting: Internal Medicine

## 2012-12-22 ENCOUNTER — Emergency Department (HOSPITAL_COMMUNITY)

## 2012-12-22 DIAGNOSIS — I10 Essential (primary) hypertension: Secondary | ICD-10-CM | POA: Diagnosis present

## 2012-12-22 DIAGNOSIS — D72829 Elevated white blood cell count, unspecified: Secondary | ICD-10-CM | POA: Diagnosis present

## 2012-12-22 DIAGNOSIS — K529 Noninfective gastroenteritis and colitis, unspecified: Secondary | ICD-10-CM | POA: Diagnosis present

## 2012-12-22 DIAGNOSIS — K5289 Other specified noninfective gastroenteritis and colitis: Principal | ICD-10-CM | POA: Diagnosis present

## 2012-12-22 DIAGNOSIS — R310 Gross hematuria: Secondary | ICD-10-CM | POA: Diagnosis present

## 2012-12-22 DIAGNOSIS — A599 Trichomoniasis, unspecified: Secondary | ICD-10-CM | POA: Diagnosis present

## 2012-12-22 DIAGNOSIS — N3289 Other specified disorders of bladder: Secondary | ICD-10-CM

## 2012-12-22 DIAGNOSIS — C50419 Malignant neoplasm of upper-outer quadrant of unspecified female breast: Secondary | ICD-10-CM

## 2012-12-22 DIAGNOSIS — N390 Urinary tract infection, site not specified: Secondary | ICD-10-CM | POA: Diagnosis present

## 2012-12-22 DIAGNOSIS — E876 Hypokalemia: Secondary | ICD-10-CM | POA: Diagnosis present

## 2012-12-22 DIAGNOSIS — R5381 Other malaise: Secondary | ICD-10-CM | POA: Diagnosis present

## 2012-12-22 DIAGNOSIS — R112 Nausea with vomiting, unspecified: Secondary | ICD-10-CM | POA: Diagnosis present

## 2012-12-22 DIAGNOSIS — C50919 Malignant neoplasm of unspecified site of unspecified female breast: Secondary | ICD-10-CM | POA: Diagnosis present

## 2012-12-22 DIAGNOSIS — F17200 Nicotine dependence, unspecified, uncomplicated: Secondary | ICD-10-CM | POA: Diagnosis present

## 2012-12-22 DIAGNOSIS — N329 Bladder disorder, unspecified: Secondary | ICD-10-CM | POA: Diagnosis present

## 2012-12-22 DIAGNOSIS — R319 Hematuria, unspecified: Secondary | ICD-10-CM

## 2012-12-22 DIAGNOSIS — Z9010 Acquired absence of unspecified breast and nipple: Secondary | ICD-10-CM

## 2012-12-22 LAB — COMPREHENSIVE METABOLIC PANEL
ALT: 25 U/L (ref 0–35)
Albumin: 3.3 g/dL — ABNORMAL LOW (ref 3.5–5.2)
Calcium: 10.1 mg/dL (ref 8.4–10.5)
GFR calc Af Amer: >90 mL/min (ref >90)
Glucose, Bld: 203 mg/dL — ABNORMAL HIGH (ref 70–99)
Potassium: 2.8 mEq/L — ABNORMAL LOW (ref 3.5–5.1)
Sodium: 135 mEq/L (ref 135–145)
Total Protein: 9.2 g/dL — ABNORMAL HIGH (ref 6.0–8.3)

## 2012-12-22 LAB — CBC WITH DIFFERENTIAL/PLATELET
Basophils Absolute: 0 10*3/uL (ref 0.0–0.1)
Basophils Relative: 0 % (ref 0–1)
Eosinophils Absolute: 0 10*3/uL (ref 0.0–0.7)
Eosinophils Relative: 0 % (ref 0–5)
Lymphs Abs: 0.8 10*3/uL (ref 0.7–4.0)
MCH: 30.8 pg (ref 26.0–34.0)
MCHC: 34.5 g/dL (ref 30.0–36.0)
MCV: 89.2 fL (ref 78.0–100.0)
Neutrophils Relative %: 92 % — ABNORMAL HIGH (ref 43–77)
Platelets: 348 10*3/uL (ref 150–400)
RDW: 12.8 % (ref 11.5–15.5)

## 2012-12-22 LAB — CG4 I-STAT (LACTIC ACID): Lactic Acid, Venous: 0.98 mmol/L (ref 0.5–2.2)

## 2012-12-22 MED ORDER — SODIUM CHLORIDE 0.9 % IV BOLUS (SEPSIS)
1000.0000 mL | Freq: Once | INTRAVENOUS | Status: AC
  Administered 2012-12-22: 1000 mL via INTRAVENOUS

## 2012-12-22 MED ORDER — SODIUM CHLORIDE 0.9 % IV SOLN: INTRAVENOUS | Status: DC

## 2012-12-22 MED ORDER — ONDANSETRON HCL 4 MG/2ML IJ SOLN
4.0000 mg | Freq: Once | INTRAMUSCULAR | Status: AC
  Administered 2012-12-22: 4 mg via INTRAVENOUS
  Filled 2012-12-22: qty 2
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
## 2012-12-22 NOTE — ED Notes (Signed)
WUJ:[id number]<JY> Expected date:<BR> Expected time:<BR> Means of arrival:<BR> Comments:<BR> EMS/cancer pt/N/V/D-dehydrated-IV established

## 2012-12-22 NOTE — ED Notes (Signed)
CG4 LACTIC ACID RESULTS GIVEN TO EDP LOCKWOOD 

## 2012-12-22 NOTE — ED Notes (Signed)
Pt has a hx of breast cancer and is currently taking radiation pills. Pt has been having n/v/d x 2 days. Worse tonight.
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
## 2012-12-23 ENCOUNTER — Inpatient Hospital Stay (HOSPITAL_COMMUNITY)

## 2012-12-23 ENCOUNTER — Encounter (HOSPITAL_COMMUNITY): Payer: Self-pay | Admitting: *Deleted

## 2012-12-23 DIAGNOSIS — I10 Essential (primary) hypertension: Secondary | ICD-10-CM

## 2012-12-23 DIAGNOSIS — E876 Hypokalemia: Secondary | ICD-10-CM

## 2012-12-23 DIAGNOSIS — C50419 Malignant neoplasm of upper-outer quadrant of unspecified female breast: Secondary | ICD-10-CM

## 2012-12-23 DIAGNOSIS — K5289 Other specified noninfective gastroenteritis and colitis: Principal | ICD-10-CM

## 2012-12-23 DIAGNOSIS — R319 Hematuria, unspecified: Secondary | ICD-10-CM

## 2012-12-23 LAB — URINALYSIS, ROUTINE W REFLEX MICROSCOPIC
Bilirubin Urine: NEGATIVE
Glucose, UA: NEGATIVE mg/dL
Ketones, ur: 15 mg/dL — AB
Leukocytes, UA: NEGATIVE
Nitrite: NEGATIVE
Protein, ur: >300 mg/dL — AB
Specific Gravity, Urine: 1.013 (ref 1.005–1.030)
Urobilinogen, UA: 1 mg/dL (ref 0.0–1.0)
pH: 6 (ref 5.0–8.0)

## 2012-12-23 LAB — CBC WITH DIFFERENTIAL/PLATELET
Basophils Relative: 0 % (ref 0–1)
Eosinophils Absolute: 0 10*3/uL (ref 0.0–0.7)
Eosinophils Relative: 0 % (ref 0–5)
MCH: 30.8 pg (ref 26.0–34.0)
MCHC: 34.3 g/dL (ref 30.0–36.0)
Neutrophils Relative %: 81 % — ABNORMAL HIGH (ref 43–77)
Platelets: 333 10*3/uL (ref 150–400)
RBC: 4.71 MIL/uL (ref 3.87–5.11)
RDW: 12.8 % (ref 11.5–15.5)

## 2012-12-23 LAB — BASIC METABOLIC PANEL
BUN: 7 mg/dL (ref 6–23)
Calcium: 9.1 mg/dL (ref 8.4–10.5)
Creatinine, Ser: 0.49 mg/dL — ABNORMAL LOW (ref 0.50–1.10)
GFR calc non Af Amer: >90 mL/min (ref >90)
Glucose, Bld: 97 mg/dL (ref 70–99)
Sodium: 133 mEq/L — ABNORMAL LOW (ref 135–145)

## 2012-12-23 LAB — URINE MICROSCOPIC-ADD ON

## 2012-12-23 LAB — MAGNESIUM: Magnesium: 2.3 mg/dL (ref 1.5–2.5)

## 2012-12-23 MED ORDER — MORPHINE SULFATE 2 MG/ML IJ SOLN
2.0000 mg | INTRAMUSCULAR | Status: DC | PRN
  Administered 2012-12-23: 2 mg via INTRAVENOUS
  Filled 2012-12-23: qty 1

## 2012-12-23 MED ORDER — GABAPENTIN 300 MG PO CAPS
300.0000 mg | ORAL_CAPSULE | Freq: Two times a day (BID) | ORAL | Status: DC
  Administered 2012-12-23 – 2012-12-25 (×5): 300 mg via ORAL
  Filled 2012-12-23 (×7): qty 1

## 2012-12-23 MED ORDER — AMLODIPINE BESYLATE 5 MG PO TABS
5.0000 mg | ORAL_TABLET | Freq: Every day | ORAL | Status: DC
  Administered 2012-12-23 – 2012-12-24 (×2): 5 mg via ORAL
  Filled 2012-12-23 (×2): qty 1

## 2012-12-23 MED ORDER — POTASSIUM CHLORIDE CRYS ER 20 MEQ PO TBCR
40.0000 meq | EXTENDED_RELEASE_TABLET | Freq: Two times a day (BID) | ORAL | Status: AC
  Administered 2012-12-23 (×3): 40 meq via ORAL
  Filled 2012-12-23 (×3): qty 2

## 2012-12-23 MED ORDER — ENOXAPARIN SODIUM 40 MG/0.4ML ~~LOC~~ SOLN
40.0000 mg | SUBCUTANEOUS | Status: DC
  Administered 2012-12-23 – 2012-12-25 (×3): 40 mg via SUBCUTANEOUS
  Filled 2012-12-23 (×3): qty 0.4

## 2012-12-23 MED ORDER — SODIUM CHLORIDE 0.9 % IV BOLUS (SEPSIS)
1000.0000 mL | Freq: Once | INTRAVENOUS | Status: AC
  Administered 2012-12-23: 1000 mL via INTRAVENOUS

## 2012-12-23 MED ORDER — ONDANSETRON HCL 4 MG/2ML IJ SOLN: 4.0000 mg | Freq: Four times a day (QID) | INTRAMUSCULAR | Status: DC | PRN

## 2012-12-23 MED ORDER — MAGNESIUM SULFATE 40 MG/ML IJ SOLN
2.0000 g | Freq: Once | INTRAMUSCULAR | Status: AC
  Administered 2012-12-23: 2 g via INTRAVENOUS
  Filled 2012-12-23: qty 50

## 2012-12-23 MED ORDER — HYDRALAZINE HCL 20 MG/ML IJ SOLN
5.0000 mg | Freq: Once | INTRAMUSCULAR | Status: AC
  Administered 2012-12-23: 5 mg via INTRAVENOUS
  Filled 2012-12-23: qty 1

## 2012-12-23 MED ORDER — SODIUM CHLORIDE 0.9 % IV SOLN
INTRAVENOUS | Status: AC
  Administered 2012-12-23 (×2): via INTRAVENOUS

## 2012-12-23 MED ORDER — ANASTROZOLE 1 MG PO TABS
1.0000 mg | ORAL_TABLET | Freq: Every day | ORAL | Status: DC
  Administered 2012-12-23 – 2012-12-25 (×3): 1 mg via ORAL
  Filled 2012-12-23 (×3): qty 1

## 2012-12-23 MED ORDER — POTASSIUM CHLORIDE 10 MEQ/100ML IV SOLN
10.0000 meq | INTRAVENOUS | Status: AC
  Administered 2012-12-23 (×4): 10 meq via INTRAVENOUS
  Filled 2012-12-23 (×4): qty 100

## 2012-12-23 MED ORDER — SODIUM CHLORIDE 0.9 % IV SOLN
INTRAVENOUS | Status: DC
  Administered 2012-12-23: 75 mL via INTRAVENOUS
  Administered 2012-12-24: 14:00:00 via INTRAVENOUS

## 2012-12-23 MED ORDER — ONDANSETRON HCL 4 MG PO TABS: 4.0000 mg | ORAL_TABLET | Freq: Four times a day (QID) | ORAL | Status: DC | PRN

## 2012-12-23 MED ORDER — METRONIDAZOLE 500 MG PO TABS
500.0000 mg | ORAL_TABLET | Freq: Two times a day (BID) | ORAL | Status: DC
  Administered 2012-12-23 – 2012-12-25 (×4): 500 mg via ORAL
  Filled 2012-12-23 (×5): qty 1

## 2012-12-23 NOTE — Care Management Note (Signed)
    Page 1 of 1   12/23/2012     11:06:30 AM   CARE MANAGEMENT NOTE 12/23/2012  Patient:  Victoria Burke,Victoria Burke   Account Number:  [id number]  Date Initiated:  12/23/2012  Documentation initiated by:  Lorenda Ishihara  Subjective/Objective Assessment:   62 yo female admitted with gastroenteritis. PTA lived at home with son.     Action/Plan:   Home when stable, awaiting PT eval.   Anticipated DC Date:  12/25/2012   Anticipated DC Plan:  HOME W HOME HEALTH SERVICES      DC Planning Services  CM consult      Choice offered to / List presented to:             Status of service:  In process, will continue to follow Medicare Important Message given?   (If response is "NO", the following Medicare IM given date fields will be blank) Date Medicare IM given:   Date Additional Medicare IM given:    Discharge Disposition:    Per UR Regulation:  Reviewed for med. necessity/level of care/duration of stay  If discussed at Long Length of Stay Meetings, dates discussed:    Comments:

## 2012-12-23 NOTE — Progress Notes (Signed)
Blood noted in urine. No odor noted. Pain denied. Dr. Benjamine Mola returned page with new orders to follow.

## 2012-12-23 NOTE — Progress Notes (Signed)
D/C contact isolation--C difficile stool specimen by pcr resulted negative

## 2012-12-23 NOTE — Progress Notes (Signed)
[facility] PROGRESS NOTE  Victoria Burke ZOX:[id number] DOB: 1950/12/03 DOA: 12/22/2012 PCP: Jackie Plum, MD  Assessment/Plan: 1. Acute gastroenteritis- IVF, r/o c diff- on precautions 2. Hypokalemia- IV replacement, await today's labs, check Mg as well 3. Weakness- ?related to K+, PT eval 4. Leukocytosis- monitor  Code Status: full Family Communication: patient at bedside Disposition Plan: PT eval home tomm?   Consultants:    Procedures:    Antibiotics:    HPI/Subjective: No diarrhea since coming to floor C/o being tired  Objective: Filed Vitals:   12/23/12 0405 12/23/12 0507 12/23/12 0520 12/23/12 0640  BP: 172/88 148/81  144/90  Pulse: 86 88  77  Temp:  98.5 F (36.9 C)  97.8 F (36.6 C)  TempSrc:  Oral  Oral  Resp:  18  22  Height:      Weight:      SpO2:  99% 97% 98%    Intake/Output Summary (Last 24 hours) at 12/23/12 0832 Last data filed at 12/23/12 0510  Gross per 24 hour  Intake    960 ml  Output    800 ml  Net    160 ml   Filed Weights   12/23/12 0225  Weight: 58.06 kg (128 lb)    Exam:   General:  Sleepy but will awaken  Cardiovascular: rrr  Respiratory: clear anterior  Abdomen: +Bs, soft  Musculoskeletal: moves all 4 ext   Data Reviewed: Basic Metabolic Panel:  Recent Labs Lab 12/22/12 2235  NA 135  K 2.8*  CL 101  CO2 22  GLUCOSE 203*  BUN 11  CREATININE 0.52  CALCIUM 10.1   Liver Function Tests:  Recent Labs Lab 12/22/12 2235  AST 28  ALT 25  ALKPHOS 148*  BILITOT 0.2*  PROT 9.2*  ALBUMIN 3.3*   No results found for this basename: LIPASE, AMYLASE,  in the last 168 hours No results found for this basename: AMMONIA,  in the last 168 hours CBC:  Recent Labs Lab 12/22/12 2235  WBC 12.1*  NEUTROABS 11.0*  HGB 15.4*  HCT 44.6  MCV 89.2  PLT 348   Cardiac Enzymes: No results found for this basename: CKTOTAL, CKMB, CKMBINDEX, TROPONINI,  in the last 168 hours BNP (last 3 results) No  results found for this basename: PROBNP,  in the last 8760 hours CBG:  Recent Labs Lab 12/23/12 0746  GLUCAP 116*    No results found for this or any previous visit (from the past 240 hour(s)).   Studies: Dg Chest 2 View  12/22/2012   *RADIOLOGY REPORT*  Clinical Data: Chest pain shortness of breath  CHEST - 2 VIEW  Comparison: Chest radiograph 03/18/2012  Findings: Cardiac leads project over the chest.  Borderline cardiomegaly, likely accentuated by AP technique.  Cardiac leads project over the chest.  Mild pulmonary vascular congestion. Diffuse mild interstitial prominence throughout the lungs. Atherosclerotic calcification of the visualized thoracolumbar abdominal aorta.  Negative for pleural effusion or pneumothorax. Left axillary surgical clips in this patient with history of left breast cancer.  No acute osseous abnormality identified.  Bones appear osteopenic.  IMPRESSION: 1. Pulmonary vascular congestion and mild diffuse interstitial prominence.  Early / interstitial pulmonary edema is not excluded. 2.  Borderline cardiomegaly.   Original Report Authenticated By: Britta Mccreedy, M.D.    Scheduled Meds: . amLODipine  5 mg Oral Daily  . anastrozole  1 mg Oral Daily  . enoxaparin (LOVENOX) injection  40 mg Subcutaneous Q24H  . gabapentin  300 mg Oral BID  .  potassium chloride  10 mEq Intravenous Q1 Hr x 4  . potassium chloride  40 mEq Oral BID   Continuous Infusions: . sodium chloride    . sodium chloride 125 mL/hr at 12/23/12 9604    Principal Problem:   Acute gastroenteritis Active Problems:   Breast cancer   Hypertension   Hypokalemia    Time spent: 35    [facility], Iwalani Templeton  [facility] Pager [phone]. If 7PM-7AM, please contact night-coverage at [email or web address], password [facility] 12/23/2012, 8:32 AM  LOS: 1 day

## 2012-12-23 NOTE — Progress Notes (Signed)
This AM there is blood to be noted in pts foley bag.  Pt also very weak and more lethargic this AM but states "I feel fine, just tired".  Obtained VS which where within her normal limits.  After arousing patient she seems more alert but goes back to sleep after several minutes.  Donnamarie Poag, NP on call notified.  No new orders given.  Will continue to monitor.

## 2012-12-23 NOTE — Evaluation (Signed)
Physical Therapy Evaluation Patient Details Name: Victoria Burke MRN: [id number] DOB: 1950/09/04 Today's Date: 12/23/2012 Time: 7829-5621 PT Time Calculation (min): 19 min  PT Assessment / Plan / Recommendation Clinical Impression  Pt admitted with acute gastroenteritis and presenting with functional mobility limitations 2* generalized weakness, decreased endurance and mild balance deficits.    PT Assessment  Patient needs continued PT services    Follow Up Recommendations  No PT follow up    Does the patient have the potential to tolerate intense rehabilitation      Barriers to Discharge None      Equipment Recommendations  Rolling walker with 5" wheels    Recommendations for Other Services     Frequency Min 3X/week    Precautions / Restrictions Precautions Precautions: Fall Restrictions Weight Bearing Restrictions: No   Pertinent Vitals/Pain NO c/ o pain      Mobility  Bed Mobility Bed Mobility: Supine to Sit Supine to Sit: 5: Supervision Details for Bed Mobility Assistance: HOB elevated 40 degrees Transfers Transfers: Sit to Stand;Stand to Sit Sit to Stand: 4: Min assist Stand to Sit: 4: Min guard Details for Transfer Assistance: cues for transition position and use of UEs to self assist.  Mild post instability with sit to stand Ambulation/Gait Ambulation/Gait Assistance: 4: Min assist;4: Min guard Ambulation Distance (Feet): 400 Feet Assistive device: Rolling walker Ambulation/Gait Assistance Details: 200' with min HHA for intermittant mild balance loss.  200' with min guard - Sup and min VC for position from RW Gait Pattern: Step-through pattern;Scissoring;Narrow base of support General Gait Details: Pt states feels much steadier/safer with use of RW Stairs: No    Exercises     PT Diagnosis: Difficulty walking  PT Problem List: Decreased strength;Decreased activity tolerance;Decreased balance;Decreased mobility;Decreased knowledge of use of DME;Decreased  safety awareness PT Treatment Interventions: DME instruction;Gait training;Stair training;Functional mobility training;Therapeutic activities;Therapeutic exercise;Patient/family education   PT Goals Acute Rehab PT Goals PT Goal Formulation: With patient Time For Goal Achievement: 12/30/12 Potential to Achieve Goals: Good Pt will go Supine/Side to Sit: with modified independence PT Goal: Supine/Side to Sit - Progress: Goal set today Pt will go Sit to Supine/Side: with modified independence PT Goal: Sit to Supine/Side - Progress: Goal set today Pt will go Sit to Stand: with modified independence PT Goal: Sit to Stand - Progress: Goal set today Pt will go Stand to Sit: with modified independence PT Goal: Stand to Sit - Progress: Goal set today Pt will Ambulate: >150 feet;with supervision;with least restrictive assistive device PT Goal: Ambulate - Progress: Goal set today Pt will Go Up / Down Stairs: 3-5 stairs;with min assist;with least restrictive assistive device PT Goal: Up/Down Stairs - Progress: Goal set today  Visit Information  Last PT Received On: 12/23/12 Assistance Needed: +1    Subjective Data  Subjective: I couldn't even walk when I came in, I was so weak Patient Stated Goal: Home with son and his GF   Prior Functioning  Home Living Lives With: Son Available Help at Discharge: Family Type of Home: House Home Access: Stairs to enter Secretary/administrator of Steps: 3 Entrance Stairs-Rails: None Home Layout: One level Home Adaptive Equipment: None Prior Function Level of Independence: Independent (Prior to recent illness) Able to Take Stairs?: Yes Communication Communication: No difficulties Dominant Hand: Right    Cognition  Cognition Arousal/Alertness: Awake/alert Behavior During Therapy: WFL for tasks assessed/performed Overall Cognitive Status: Within Functional Limits for tasks assessed    Extremity/Trunk Assessment Right Upper Extremity Assessment RUE  ROM/Strength/Tone: [facility] for tasks assessed Left Upper Extremity Assessment LUE ROM/Strength/Tone: [facility] for tasks assessed Right Lower Extremity Assessment RLE ROM/Strength/Tone: [facility] for tasks assessed Left Lower Extremity Assessment LLE ROM/Strength/Tone: [facility] for tasks assessed Trunk Assessment Trunk Assessment: Normal   Balance Balance Balance Assessed: Yes Static Sitting Balance Static Sitting - Balance Support: No upper extremity supported Static Sitting - Level of Assistance: 7: Independent Static Sitting - Comment/# of Minutes: 3 Static Standing Balance Static Standing - Balance Support: Left upper extremity supported Static Standing - Level of Assistance: 4: Min assist Static Standing - Comment/# of Minutes: 1  End of Session PT - End of Session Equipment Utilized During Treatment: Gait belt Activity Tolerance: Patient limited by fatigue Patient left: in chair;with call bell/phone within reach Nurse Communication: Mobility status  GP     Victoria Burke 12/23/2012, 5:31 PM

## 2012-12-23 NOTE — Progress Notes (Signed)
Placed patient on isolation precautions to r/o cdiff, explained to patient that we needed a stool specimen.  Patient will notify staff when she needs to have a BM.

## 2012-12-24 ENCOUNTER — Inpatient Hospital Stay (HOSPITAL_COMMUNITY)

## 2012-12-24 DIAGNOSIS — R319 Hematuria, unspecified: Secondary | ICD-10-CM

## 2012-12-24 DIAGNOSIS — N3289 Other specified disorders of bladder: Secondary | ICD-10-CM

## 2012-12-24 LAB — BASIC METABOLIC PANEL
Calcium: 9.1 mg/dL (ref 8.4–10.5)
Creatinine, Ser: 0.54 mg/dL (ref 0.50–1.10)
GFR calc non Af Amer: >90 mL/min (ref >90)
Sodium: 136 mEq/L (ref 135–145)

## 2012-12-24 MED ORDER — AMLODIPINE BESYLATE 5 MG PO TABS
5.0000 mg | ORAL_TABLET | Freq: Once | ORAL | Status: AC
  Administered 2012-12-24: 5 mg via ORAL
  Filled 2012-12-24: qty 1

## 2012-12-24 MED ORDER — AMLODIPINE BESYLATE 10 MG PO TABS
10.0000 mg | ORAL_TABLET | Freq: Every day | ORAL | Status: DC
  Administered 2012-12-25: 10 mg via ORAL
  Filled 2012-12-24: qty 1

## 2012-12-24 MED ORDER — IOHEXOL 300 MG/ML  SOLN
125.0000 mL | Freq: Once | INTRAMUSCULAR | Status: AC | PRN
  Administered 2012-12-24: 125 mL via INTRAVENOUS

## 2012-12-24 NOTE — Progress Notes (Addendum)
[facility] PROGRESS NOTE  Victoria Burke ZOX:[id number] DOB: 1951/02/06 DOA: 12/22/2012 PCP: Jackie Plum, MD  Assessment/Plan: 1. Acute gastroenteritis- IVF,c diff negative 2. Hematuria- U/S shows bladder mass- urology consulted 3. Hypokalemia- replaced 4. Weakness- resolved- PT recs for rolling walker 5. Leukocytosis- monitor 6. trichomonas flagyl  Code Status: full Family Communication: patient at bedside Disposition Plan: home after urology eval   Consultants:    Procedures:    Antibiotics:    HPI/Subjective: Feeling better today  Objective: Filed Vitals:   12/23/12 1007 12/23/12 1400 12/23/12 2158 12/24/12 0605  BP: 162/96 151/76 152/95 178/95  Pulse: 80 65 63 58  Temp: 97.1 F (36.2 C) 97.8 F (36.6 C) 98.2 F (36.8 C) 97.8 F (36.6 C)  TempSrc: Oral Oral Oral Oral  Resp: 20 18 18 18   Height:      Weight:      SpO2:  90% 97% 96%    Intake/Output Summary (Last 24 hours) at 12/24/12 0923 Last data filed at 12/24/12 0606  Gross per 24 hour  Intake 1224.17 ml  Output   1700 ml  Net -475.83 ml   Filed Weights   12/23/12 0225  Weight: 58.06 kg (128 lb)    Exam:   General:  Sleepy but will awaken  Cardiovascular: rrr  Respiratory: clear anterior  Abdomen: +Bs, soft  Musculoskeletal: moves all 4 ext   Data Reviewed: Basic Metabolic Panel:  Recent Labs Lab 12/22/12 2235 12/23/12 0924 12/24/12 0445  NA 135 133* 136  K 2.8* 4.4 4.5  CL 101 104 106  CO2 22 20 21   GLUCOSE 203* 97 91  BUN 11 7 6   CREATININE 0.52 0.49* 0.54  CALCIUM 10.1 9.1 9.1  MG  --  2.3  --    Liver Function Tests:  Recent Labs Lab 12/22/12 2235  AST 28  ALT 25  ALKPHOS 148*  BILITOT 0.2*  PROT 9.2*  ALBUMIN 3.3*   No results found for this basename: LIPASE, AMYLASE,  in the last 168 hours No results found for this basename: AMMONIA,  in the last 168 hours CBC:  Recent Labs Lab 12/22/12 2235 12/23/12 0924  WBC 12.1* 9.9  NEUTROABS  11.0* 8.0*  HGB 15.4* 14.5  HCT 44.6 42.3  MCV 89.2 89.8  PLT 348 333   Cardiac Enzymes: No results found for this basename: CKTOTAL, CKMB, CKMBINDEX, TROPONINI,  in the last 168 hours BNP (last 3 results) No results found for this basename: PROBNP,  in the last 8760 hours CBG:  Recent Labs Lab 12/23/12 0746  GLUCAP 116*    Recent Results (from the past 240 hour(s))  CLOSTRIDIUM DIFFICILE BY PCR     Status: None   Collection Time    12/23/12 10:02 AM      Result Value Range Status   C difficile by pcr NEGATIVE  NEGATIVE Final     Studies: Dg Chest 2 View  12/22/2012   *RADIOLOGY REPORT*  Clinical Data: Chest pain shortness of breath  CHEST - 2 VIEW  Comparison: Chest radiograph 03/18/2012  Findings: Cardiac leads project over the chest.  Borderline cardiomegaly, likely accentuated by AP technique.  Cardiac leads project over the chest.  Mild pulmonary vascular congestion. Diffuse mild interstitial prominence throughout the lungs. Atherosclerotic calcification of the visualized thoracolumbar abdominal aorta.  Negative for pleural effusion or pneumothorax. Left axillary surgical clips in this patient with history of left breast cancer.  No acute osseous abnormality identified.  Bones appear osteopenic.  IMPRESSION: 1. Pulmonary vascular  congestion and mild diffuse interstitial prominence.  Early / interstitial pulmonary edema is not excluded. 2.  Borderline cardiomegaly.   Original Report Authenticated By: Britta Mccreedy, M.D.   US Renal  12/23/2012   *RADIOLOGY REPORT*  Clinical Data: Hematuria.  RENAL/URINARY TRACT ULTRASOUND COMPLETE  Comparison:  None  Findings:  Right Kidney:  10.5 cm. Normal size and echotexture.  No focal abnormality.  No hydronephrosis.  Left Kidney:  10.8 cm. Normal size and echotexture.  No focal abnormality.  No hydronephrosis.  Bladder:  Large posterior hypoechoic soft tissue mass along the posterior bladder wall measures 3.3 x 3.2 x 3.0 cm.  Findings  concerning for exophytic bladder wall mass/neoplasm.  IMPRESSION: 3.3 cm solid hypoechoic posterior bladder wall mass concerning for neoplasm.   Original Report Authenticated By: Charlett Nose, M.D.    Scheduled Meds: . amLODipine  5 mg Oral Daily  . anastrozole  1 mg Oral Daily  . enoxaparin (LOVENOX) injection  40 mg Subcutaneous Q24H  . gabapentin  300 mg Oral BID  . metroNIDAZOLE  500 mg Oral Q12H   Continuous Infusions: . sodium chloride    . sodium chloride 75 mL (12/23/12 2028)    Principal Problem:   Acute gastroenteritis Active Problems:   Breast cancer   Hypertension   Hypokalemia    Time spent: 35    [facility], Tylah Mancillas  [facility] Pager [phone]. If 7PM-7AM, please contact night-coverage at [email or web address], password [facility] 12/24/2012, 9:23 AM  LOS: 2 days
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
## 2012-12-25 DIAGNOSIS — N3289 Other specified disorders of bladder: Secondary | ICD-10-CM

## 2012-12-25 DIAGNOSIS — N390 Urinary tract infection, site not specified: Secondary | ICD-10-CM

## 2012-12-25 LAB — BASIC METABOLIC PANEL
Calcium: 9.4 mg/dL (ref 8.4–10.5)
GFR calc Af Amer: >90 mL/min (ref >90)
GFR calc non Af Amer: >90 mL/min (ref >90)
Glucose, Bld: 111 mg/dL — ABNORMAL HIGH (ref 70–99)
Potassium: 3.4 mEq/L — ABNORMAL LOW (ref 3.5–5.1)
Sodium: 133 mEq/L — ABNORMAL LOW (ref 135–145)

## 2012-12-25 LAB — CBC
Hemoglobin: 13.8 g/dL (ref 12.0–15.0)
MCH: 29.9 pg (ref 26.0–34.0)
MCHC: 33.6 g/dL (ref 30.0–36.0)
Platelets: 315 10*3/uL (ref 150–400)

## 2012-12-25 LAB — URINE CULTURE
Colony Count: NO GROWTH
Culture: NO GROWTH

## 2012-12-25 MED ORDER — DEXTROSE 5 % IV SOLN
1.0000 g | Freq: Once | INTRAVENOUS | Status: AC
  Administered 2012-12-25: 1 g via INTRAVENOUS
  Filled 2012-12-25: qty 10

## 2012-12-25 MED ORDER — CEFUROXIME AXETIL 500 MG PO TABS: 500.0000 mg | ORAL_TABLET | Freq: Two times a day (BID) | ORAL | Status: DC

## 2012-12-25 MED ORDER — POTASSIUM CHLORIDE CRYS ER 20 MEQ PO TBCR
40.0000 meq | EXTENDED_RELEASE_TABLET | Freq: Once | ORAL | Status: AC
  Administered 2012-12-25: 40 meq via ORAL
  Filled 2012-12-25: qty 2

## 2012-12-25 NOTE — Progress Notes (Signed)
Subjective: Patient reports No pain.  Feels better.  No nausea or diarrhea.  Still has hematuria.  Objective: Vital signs in last 24 hours: Temp:  [97.7 F (36.5 C)-98.2 F (36.8 C)] 98.2 F (36.8 C) (06/20 1400) Pulse Rate:  [53-81] 81 (06/20 1400) Resp:  [18-20] 18 (06/20 1400) BP: (155-172)/(72-83) 155/72 mmHg (06/20 1400) SpO2:  [97 %-100 %] 97 % (06/20 1400)  Intake/Output from previous day: 06/19 0701 - 06/20 0700 In: 1750 [P.O.:460; I.V.:1290] Out: 2575 [Urine:2575] Intake/Output this shift: Total I/O In: 2185 [P.O.:360; I.V.:1825] Out: 650 [Urine:650]  Physical Exam:  Has gross hematuria.   Lab Results:  Recent Labs  12/22/12 2235 12/23/12 0924 12/25/12 0416  HGB 15.4* 14.5 13.8  HCT 44.6 42.3 41.1   BMET  Recent Labs  12/24/12 0445 12/25/12 0416  NA 136 133*  K 4.5 3.4*  CL 106 102  CO2 21 22  GLUCOSE 91 111*  BUN 6 12  CREATININE 0.54 0.66  CALCIUM 9.1 9.4   No results found for this basename: LABPT, INR,  in the last 72 hours No results found for this basename: LABURIN,  in the last 72 hours Results for orders placed during the hospital encounter of 12/22/12  CULTURE, BLOOD (ROUTINE X 2)     Status: None   Collection Time    12/22/12 10:35 PM      Result Value Range Status   Specimen Description BLOOD LEFT HAND   Final   Special Requests BOTTLES DRAWN AEROBIC AND ANAEROBIC   Final   Culture  Setup Time 12/23/2012 02:12   Final   Culture     Final   Value:        BLOOD CULTURE RECEIVED NO GROWTH TO DATE CULTURE WILL BE HELD FOR 5 DAYS BEFORE ISSUING A FINAL NEGATIVE REPORT   Report Status PENDING   Incomplete  CULTURE, BLOOD (ROUTINE X 2)     Status: None   Collection Time    12/22/12 10:35 PM      Result Value Range Status   Specimen Description BLOOD RIGHT HAND   Final   Special Requests BOTTLES DRAWN AEROBIC AND ANAEROBIC 5CC   Final   Culture  Setup Time 12/23/2012 02:11   Final   Culture     Final   Value:        BLOOD  CULTURE RECEIVED NO GROWTH TO DATE CULTURE WILL BE HELD FOR 5 DAYS BEFORE ISSUING A FINAL NEGATIVE REPORT   Report Status PENDING   Incomplete  CLOSTRIDIUM DIFFICILE BY PCR     Status: None   Collection Time    12/23/12 10:02 AM      Result Value Range Status   C difficile by pcr NEGATIVE  NEGATIVE Final    Studies/Results: Ct Abdomen Pelvis W Wo Contrast  12/24/2012   *RADIOLOGY REPORT*  Clinical Data: Bladder mass.  Painless hematuria.  CT ABDOMEN AND PELVIS WITHOUT AND WITH CONTRAST  Technique:  Multidetector CT imaging of the abdomen and pelvis was performed without contrast material in one or both body regions, followed by contrast material(s) and further sections in one or both body regions.  Contrast: OMNIPAQUE IOHEXOL 300 MG/ML  SOLN  Comparison: Ultrasound 12/23/2012.  Findings: There is a solid enhancing intraluminal bladder mass at the bladder base measuring a maximum of 3 cm.  No bladder wall thickening.  The ureters appear normal.  A small amount of air is noted the bladder which may be procedural in nature.  The  The kidneys are unremarkable.  No renal mass lesions or hydronephrosis.  No collecting system abnormalities on the delayed images.  Both ureters appear normal.  The liver is grossly normal.  Possible mild cirrhotic changes.  No focal hepatic lesions.  The gallbladder is mildly contracted.  No common bile duct dilatation.  The pancreas is unremarkable.  The spleen is normal.  The adrenal glands are unremarkable.  The stomach, duodenum, small bowel and colon grossly normal.  There is mild apparent proximal small bowel wall thickening.  Cannot exclude an inflammatory process.  No obstruction.  The appendix is normal.  No mesenteric or retroperitoneal mass or adenopathy. Small scattered lymph nodes are noted.  Severe atherosclerotic calcifications involving the aorta and branch vessel ostia, particularly for the patient's age.  No focal aneurysm.  No pelvic lymphadenopathy.  There  is a small amount of free pelvic fluid.  No inguinal mass or hernia.  The bony structures are intact.  No destructive bone lesions or spinal canal compromise.  Moderate degenerative changes in the spine, SI joints and hips.  IMPRESSION:  1.  3 cm intraluminal solid enhancing bladder mass consistent with neoplasm. 2.  Normal CT appearance of the kidneys and ureters. 3.  No findings to suggest metastatic disease or adenopathy. 4.  Advanced atherosclerotic calcifications involving the aorta and branch vessel ostia. 5.  Possible proximal small bowel wall thickening could reflect an inflammatory process.   Original Report Authenticated By: Rudie Meyer, M.D.   US Renal  12/23/2012   *RADIOLOGY REPORT*  Clinical Data: Hematuria.  RENAL/URINARY TRACT ULTRASOUND COMPLETE  Comparison:  None  Findings:  Right Kidney:  10.5 cm. Normal size and echotexture.  No focal abnormality.  No hydronephrosis.  Left Kidney:  10.8 cm. Normal size and echotexture.  No focal abnormality.  No hydronephrosis.  Bladder:  Large posterior hypoechoic soft tissue mass along the posterior bladder wall measures 3.3 x 3.2 x 3.0 cm.  Findings concerning for exophytic bladder wall mass/neoplasm.  IMPRESSION: 3.3 cm solid hypoechoic posterior bladder wall mass concerning for neoplasm.   Original Report Authenticated By: Charlett Nose, M.D.    Assessment/Plan:  Bladder mass.  Gross hematuria.  I advised patient to quit smoking.  Needs cystoscopy.  Can be done as outpatient.  Will follow after discharge.    LOS: 3 days   Victoria Burke 12/25/2012, 5:21 PM

## 2012-12-25 NOTE — Discharge Summary (Signed)
Physician Discharge Summary  Ruthe Roemer NWG:[id number] DOB: [birth year] DOA: 12/22/2012  PCP: Jackie Plum, MD  Admit date: 12/22/2012 Discharge date: 12/25/2012  Time spent: >30 minutes  Recommendations for Outpatient Follow-up:      Follow-up Information   Follow up with NESI,MARC-HENRY, MD. (call office for appt 1-2 weeks after discharge from hospital)    Contact information:   [address] [place] [address] [phone]       Follow up with OSEI-BONSU,GEORGE, MD. (or New PCP as planned in 1-2weeks, call for appt upon discharge)    Contact information:   [address], [address] [place] [address] [phone]        Discharge Diagnoses:  Principal Problem:   Acute gastroenteritis Active Problems:   Breast cancer   Hypertension   Hypokalemia   Bladder mass   Hematuria   Discharge Condition: improved/stable  Diet recommendation: regular  Filed Weights   12/23/12 0225  Weight: 58.06 kg (128 lb)    History of present illness:  Pt is a 62 year old breast cancer survivor who lives at home with his son comes in after having 3-4 episodes of no bloody dirrhea and 3-4 episodes of non bloody vomiting which started on the morning of admission. Patient was well last night. She had hamburger on the morning of admission for breakfast.  Patient also c/o being extremely weak and is unable to walk.  Patient was noted on admission to be deconditioned at baseline. She gets short of breath with minimal exertion but able to do her activities of daily living. Patient denies fever, chills, chest pain, shortness of breath, palpitations, swelling, orthopnea, PND.  Found to have elevated white count and hypokalemia in ER and was asked to admit the patient.    Hospital Course:  1. Acute gastroenteritis- upon admission patient was managed supportively with IV fluids, and was empirically placed on Flagyl. C. difficile was done and came  back. Her diarrhea resolved. She has been tolerating by mouth's well, with no further nausea or vomiting. She is medically stable for discharge at this time and would not require any further flagyl upon discharge. 2. Hematuria- U/S shows bladder mass- urology and she was seen in the hospital and outpatient followup for further eval/cystoscopy recommended with Dr. Harriett Sine. A urinalysis was also done and consistent with a UTI, she was started on antibiotics and will be discharged on oral abx to followup outpatient. 3. Hypokalemia- potassium was repleted in the hospital the 4. Weakness- resolved- PT recs for rolling walker 5. Leukocytosis- likely secondary to #1 and 7, resolved. 6. Trichomonas-was treated with  Flagyl in the hospital, no trichomonas present on repeat urinalysis on 6/18 at 18:16pm. 7. Probable UTI-will give IV Rocephin today, discharged on oral antibiotics/Ceftin. Urine cultures are still pending at this time, She is to follow up outpatient with urology and PCP.   Procedures: NONE Consultations:  UROLOGY  Discharge Exam: Filed Vitals:   12/24/12 1400 12/24/12 2301 12/25/12 0528 12/25/12 1400  BP: 152/72 172/82 159/83 155/72  Pulse: 70 58 53 81  Temp: 98.1 F (36.7 C) 97.8 F (36.6 C) 97.7 F (36.5 C) 98.2 F (36.8 C)  TempSrc: Oral Oral Oral Oral  Resp: 20 20 18 18   Height:      Weight:      SpO2: 97% 98% 100% 97%    Exam:  General: Sleepy but will awaken  Cardiovascular: RRR Respiratory: clear anterior  Abdomen: +Bs, soft  Extremities: no cyanosis and no edema    Discharge Instructions  Discharge Orders   Future Appointments Provider Department Dept Phone   01/12/2013 3:20 PM Maryln Gottron, MD [facility] [phone]   Future Orders Complete By Expires     Diet general  As directed     Increase activity slowly  As directed         Medication List    TAKE these medications       albuterol 108 (90 BASE) MCG/ACT inhaler   Commonly known as:  PROVENTIL HFA;VENTOLIN HFA  Inhale 2 puffs into the lungs every 6 (six) hours as needed. For shortness of breath     amLODipine 10 MG tablet  Commonly known as:  NORVASC  Take 10 mg by mouth daily.     anastrozole 1 MG tablet  Commonly known as:  ARIMIDEX  Take 1 mg by mouth daily.     gabapentin 300 MG capsule  Commonly known as:  NEURONTIN  Take 300 mg by mouth 2 (two) times daily.       Ceftin 500 mg 1 tablet by mouth twice a day No Known Allergies     Follow-up Information   Follow up with NESI,MARC-HENRY, MD. (call office for appt 1-2 weeks after discharge from hospital)    Contact information:   [address] [place] [address] [phone]       Follow up with OSEI-BONSU,GEORGE, MD. (or New PCP as planned in 1-2weeks, call for appt upon discharge)    Contact information:   [address], [address] [place] [address] [phone]        The results of significant diagnostics from this hospitalization (including imaging, microbiology, ancillary and laboratory) are listed below for reference.    Significant Diagnostic Studies: Ct Abdomen Pelvis W Wo Contrast  12/24/2012   *RADIOLOGY REPORT*  Clinical Data: Bladder mass.  Painless hematuria.  CT ABDOMEN AND PELVIS WITHOUT AND WITH CONTRAST  Technique:  Multidetector CT imaging of the abdomen and pelvis was performed without contrast material in one or both body regions, followed by contrast material(s) and further sections in one or both body regions.  Contrast: OMNIPAQUE IOHEXOL 300 MG/ML  SOLN  Comparison: Ultrasound 12/23/2012.  Findings: There is a solid enhancing intraluminal bladder mass at the bladder base measuring a maximum of 3 cm.  No bladder wall thickening.  The ureters appear normal.  A small amount of air is noted the bladder which may be procedural in nature.  The  The kidneys are unremarkable.  No renal mass lesions or  hydronephrosis.  No collecting system abnormalities on the delayed images.  Both ureters appear normal.  The liver is grossly normal.  Possible mild cirrhotic changes.  No focal hepatic lesions.  The gallbladder is mildly contracted.  No common bile duct dilatation.  The pancreas is unremarkable.  The spleen is normal.  The adrenal glands are unremarkable.  The stomach, duodenum, small bowel and colon grossly normal.  There is mild apparent proximal small bowel wall thickening.  Cannot exclude an inflammatory process.  No obstruction.  The appendix is normal.  No mesenteric or retroperitoneal mass or adenopathy. Small scattered lymph nodes are noted.  Severe atherosclerotic calcifications involving  the aorta and branch vessel ostia, particularly for the patient's age.  No focal aneurysm.  No pelvic lymphadenopathy.  There is a small amount of free pelvic fluid.  No inguinal mass or hernia.  The bony structures are intact.  No destructive bone lesions or spinal canal compromise.  Moderate degenerative changes in the spine, SI joints and hips.  IMPRESSION:  1.  3 cm intraluminal solid enhancing bladder mass consistent with neoplasm. 2.  Normal CT appearance of the kidneys and ureters. 3.  No findings to suggest metastatic disease or adenopathy. 4.  Advanced atherosclerotic calcifications involving the aorta and branch vessel ostia. 5.  Possible proximal small bowel wall thickening could reflect an inflammatory process.   Original Report Authenticated By: Rudie Meyer, M.D.   Dg Chest 2 View  12/22/2012   *RADIOLOGY REPORT*  Clinical Data: Chest pain shortness of breath  CHEST - 2 VIEW  Comparison: Chest radiograph 03/18/2012  Findings: Cardiac leads project over the chest.  Borderline cardiomegaly, likely accentuated by AP technique.  Cardiac leads project over the chest.  Mild pulmonary vascular congestion. Diffuse mild interstitial prominence throughout the lungs. Atherosclerotic calcification of the visualized  thoracolumbar abdominal aorta.  Negative for pleural effusion or pneumothorax. Left axillary surgical clips in this patient with history of left breast cancer.  No acute osseous abnormality identified.  Bones appear osteopenic.  IMPRESSION: 1. Pulmonary vascular congestion and mild diffuse interstitial prominence.  Early / interstitial pulmonary edema is not excluded. 2.  Borderline cardiomegaly.   Original Report Authenticated By: Britta Mccreedy, M.D.   US Renal  12/23/2012   *RADIOLOGY REPORT*  Clinical Data: Hematuria.  RENAL/URINARY TRACT ULTRASOUND COMPLETE  Comparison:  None  Findings:  Right Kidney:  10.5 cm. Normal size and echotexture.  No focal abnormality.  No hydronephrosis.  Left Kidney:  10.8 cm. Normal size and echotexture.  No focal abnormality.  No hydronephrosis.  Bladder:  Large posterior hypoechoic soft tissue mass along the posterior bladder wall measures 3.3 x 3.2 x 3.0 cm.  Findings concerning for exophytic bladder wall mass/neoplasm.  IMPRESSION: 3.3 cm solid hypoechoic posterior bladder wall mass concerning for neoplasm.   Original Report Authenticated By: Charlett Nose, M.D.    Microbiology: Recent Results (from the past 240 hour(s))  CULTURE, BLOOD (ROUTINE X 2)     Status: None   Collection Time    12/22/12 10:35 PM      Result Value Range Status   Specimen Description BLOOD LEFT HAND   Final   Special Requests BOTTLES DRAWN AEROBIC AND ANAEROBIC   Final   Culture  Setup Time 12/23/2012 02:12   Final   Culture     Final   Value:        BLOOD CULTURE RECEIVED NO GROWTH TO DATE CULTURE WILL BE HELD FOR 5 DAYS BEFORE ISSUING A FINAL NEGATIVE REPORT   Report Status PENDING   Incomplete  CULTURE, BLOOD (ROUTINE X 2)     Status: None   Collection Time    12/22/12 10:35 PM      Result Value Range Status   Specimen Description BLOOD RIGHT HAND   Final   Special Requests BOTTLES DRAWN AEROBIC AND ANAEROBIC 5CC   Final   Culture  Setup Time 12/23/2012 02:11   Final    Culture     Final   Value:        BLOOD CULTURE RECEIVED NO GROWTH TO DATE CULTURE WILL BE HELD FOR 5 DAYS BEFORE ISSUING A  FINAL NEGATIVE REPORT   Report Status PENDING   Incomplete  CLOSTRIDIUM DIFFICILE BY PCR     Status: None   Collection Time    12/23/12 10:02 AM      Result Value Range Status   C difficile by pcr NEGATIVE  NEGATIVE Final     Labs: Basic Metabolic Panel:  Recent Labs Lab 12/22/12 2235 12/23/12 0924 12/24/12 0445 12/25/12 0416  NA 135 133* 136 133*  K 2.8* 4.4 4.5 3.4*  CL 101 104 106 102  CO2 22 20 21 22   GLUCOSE 203* 97 91 111*  BUN 11 7 6 12   CREATININE 0.52 0.49* 0.54 0.66  CALCIUM 10.1 9.1 9.1 9.4  MG  --  2.3  --   --    Liver Function Tests:  Recent Labs Lab 12/22/12 2235  AST 28  ALT 25  ALKPHOS 148*  BILITOT 0.2*  PROT 9.2*  ALBUMIN 3.3*   No results found for this basename: LIPASE, AMYLASE,  in the last 168 hours No results found for this basename: AMMONIA,  in the last 168 hours CBC:  Recent Labs Lab 12/22/12 2235 12/23/12 0924 12/25/12 0416  WBC 12.1* 9.9 4.9  NEUTROABS 11.0* 8.0*  --   HGB 15.4* 14.5 13.8  HCT 44.6 42.3 41.1  MCV 89.2 89.8 89.2  PLT 348 333 315   Cardiac Enzymes: No results found for this basename: CKTOTAL, CKMB, CKMBINDEX, TROPONINI,  in the last 168 hours BNP: BNP (last 3 results) No results found for this basename: PROBNP,  in the last 8760 hours CBG:  Recent Labs Lab 12/23/12 0746  GLUCAP 116*       Signed:  Darrion Wyszynski C  [facility] 12/25/2012, 3:32 PM

## 2012-12-25 NOTE — Progress Notes (Signed)
dme ordered rolling walker for pt who is 5'2'' and 128 lbs.

## 2012-12-25 NOTE — Progress Notes (Signed)
Physical Therapy Treatment Patient Details Name: Victoria Burke MRN: [id number] DOB: 1950/12/06 Today's Date: 12/25/2012 Time: 5956-3875 PT Time Calculation (min): 24 min  PT Assessment / Plan / Recommendation Comments on Treatment Session  Pt hopes to D/C to home today, stating she feels much better.  Pt would still like a RW for home.    Follow Up Recommendations  No PT follow up     Does the patient have the potential to tolerate intense rehabilitation     Barriers to Discharge        Equipment Recommendations  Rolling walker with 5" wheels    Recommendations for Other Services    Frequency Min 3X/week   Plan      Precautions / Restrictions     Pertinent Vitals/Pain No c/o pain    Mobility  Bed Mobility Bed Mobility: Supine to Sit Supine to Sit: 5: Supervision Details for Bed Mobility Assistance: increased time Transfers Transfers: Sit to Stand Sit to Stand: 6: Modified independent (Device/Increase time) Stand to Sit: 6: Modified independent (Device/Increase time) Details for Transfer Assistance: increased time Ambulation/Gait Ambulation/Gait Assistance: 5: Supervision Ambulation Distance (Feet): 500 Feet Assistive device: Rolling walker Ambulation/Gait Assistance Details: Pt amb self in room pushing IV pole w/o assist.  Amb in hallway with RW for increased steadyness which pt stated she liked the walker.  Gait Pattern: Step-through pattern Gait velocity: increased    PT Goals                                                              progressing    Visit Information  Last PT Received On: 12/25/12 Assistance Needed: +1    Subjective Data      Cognition       Balance   good  End of Session PT - End of Session Equipment Utilized During Treatment: Gait belt Activity Tolerance: Patient tolerated treatment well Patient left: in chair;with call bell/phone within reach   Felecia Shelling  PTA [facility]  Acute  Rehab Pager      [phone]

## 2012-12-29 LAB — CULTURE, BLOOD (ROUTINE X 2): Culture: NO GROWTH

## 2012-12-30 ENCOUNTER — Other Ambulatory Visit: Payer: Self-pay | Admitting: Urology

## 2012-12-31 ENCOUNTER — Encounter (HOSPITAL_BASED_OUTPATIENT_CLINIC_OR_DEPARTMENT_OTHER): Payer: Self-pay | Admitting: *Deleted

## 2013-01-01 ENCOUNTER — Encounter (HOSPITAL_BASED_OUTPATIENT_CLINIC_OR_DEPARTMENT_OTHER): Payer: Self-pay | Admitting: *Deleted

## 2013-01-01 NOTE — Progress Notes (Signed)
NPO AFTER MN. ARRIVES AT 0845. NEEDS ISTAT. CURRENT EKG IN EPIC AND CHART. WILL TAKE NORVASC AM OF SURG W/ SIP OF WATER.

## 2013-01-05 ENCOUNTER — Encounter (HOSPITAL_BASED_OUTPATIENT_CLINIC_OR_DEPARTMENT_OTHER): Payer: Self-pay

## 2013-01-05 ENCOUNTER — Ambulatory Visit (HOSPITAL_BASED_OUTPATIENT_CLINIC_OR_DEPARTMENT_OTHER)
Admission: RE | Admit: 2013-01-05 | Discharge: 2013-01-05 | Disposition: A | Discharge status: Home / Self Care | Source: Ambulatory Visit | Attending: Urology | Admitting: Urology

## 2013-01-05 ENCOUNTER — Encounter (HOSPITAL_BASED_OUTPATIENT_CLINIC_OR_DEPARTMENT_OTHER): Payer: Self-pay | Admitting: Anesthesiology

## 2013-01-05 ENCOUNTER — Encounter (HOSPITAL_BASED_OUTPATIENT_CLINIC_OR_DEPARTMENT_OTHER)
Admission: RE | Disposition: A | Discharge status: Home / Self Care | Payer: Self-pay | Source: Ambulatory Visit | Attending: Urology

## 2013-01-05 ENCOUNTER — Ambulatory Visit (HOSPITAL_BASED_OUTPATIENT_CLINIC_OR_DEPARTMENT_OTHER): Admitting: Anesthesiology

## 2013-01-05 ENCOUNTER — Ambulatory Visit: Payer: Self-pay | Admitting: Radiation Oncology

## 2013-01-05 DIAGNOSIS — Z853 Personal history of malignant neoplasm of breast: Secondary | ICD-10-CM | POA: Insufficient documentation

## 2013-01-05 DIAGNOSIS — Z79899 Other long term (current) drug therapy: Secondary | ICD-10-CM | POA: Insufficient documentation

## 2013-01-05 DIAGNOSIS — I10 Essential (primary) hypertension: Secondary | ICD-10-CM | POA: Insufficient documentation

## 2013-01-05 DIAGNOSIS — C679 Malignant neoplasm of bladder, unspecified: Secondary | ICD-10-CM | POA: Insufficient documentation

## 2013-01-05 HISTORY — PX: TRANSURETHRAL RESECTION OF BLADDER TUMOR: SHX2575

## 2013-01-05 HISTORY — PX: CYSTOSCOPY: SHX5120

## 2013-01-05 HISTORY — DX: Chronic obstructive pulmonary disease, unspecified: J44.9

## 2013-01-05 LAB — POCT I-STAT 4, (NA,K, GLUC, HGB,HCT)
HCT: 45 % (ref 36.0–46.0)
Sodium: 142 mEq/L (ref 135–145)

## 2013-01-05 SURGERY — TURBT (TRANSURETHRAL RESECTION OF BLADDER TUMOR)
Anesthesia: General | Site: Bladder | Wound class: Clean Contaminated

## 2013-01-05 MED ORDER — PROPOFOL 10 MG/ML IV BOLUS
INTRAVENOUS | Status: DC | PRN
  Administered 2013-01-05: 150 mg via INTRAVENOUS

## 2013-01-05 MED ORDER — MITOMYCIN CHEMO FOR BLADDER INSTILLATION 40 MG
40.0000 mg | Freq: Once | INTRAVENOUS | Status: DC
  Filled 2013-01-05: qty 40

## 2013-01-05 MED ORDER — MIDAZOLAM HCL 5 MG/5ML IJ SOLN
INTRAMUSCULAR | Status: DC | PRN
  Administered 2013-01-05: 2 mg via INTRAVENOUS

## 2013-01-05 MED ORDER — ACETAMINOPHEN 10 MG/ML IV SOLN
1000.0000 mg | Freq: Once | INTRAVENOUS | Status: DC | PRN
  Filled 2013-01-05: qty 100

## 2013-01-05 MED ORDER — BELLADONNA ALKALOIDS-OPIUM 16.2-60 MG RE SUPP
RECTAL | Status: DC | PRN
  Administered 2013-01-05: 1 via RECTAL

## 2013-01-05 MED ORDER — MITOMYCIN CHEMO FOR BLADDER INSTILLATION 40 MG
INTRAVENOUS | Status: DC | PRN
  Administered 2013-01-05: 40 mg via INTRAVESICAL

## 2013-01-05 MED ORDER — OXYBUTYNIN CHLORIDE ER 10 MG PO TB24: 10.0000 mg | ORAL_TABLET | Freq: Every day | ORAL | Status: DC

## 2013-01-05 MED ORDER — STERILE WATER FOR IRRIGATION IR SOLN
Status: DC | PRN
  Administered 2013-01-05: 18000 mL

## 2013-01-05 MED ORDER — HYDROCODONE-ACETAMINOPHEN 5-325 MG PO TABS: 1.0000 | ORAL_TABLET | Freq: Four times a day (QID) | ORAL | Status: DC | PRN

## 2013-01-05 MED ORDER — ONDANSETRON HCL 4 MG/2ML IJ SOLN
INTRAMUSCULAR | Status: DC | PRN
  Administered 2013-01-05: 4 mg via INTRAVENOUS

## 2013-01-05 MED ORDER — CEFAZOLIN SODIUM-DEXTROSE 2-3 GM-% IV SOLR
2.0000 g | INTRAVENOUS | Status: AC
  Administered 2013-01-05: 2 g via INTRAVENOUS
  Filled 2013-01-05: qty 50

## 2013-01-05 MED ORDER — OXYCODONE HCL 5 MG/5ML PO SOLN
5.0000 mg | Freq: Once | ORAL | Status: DC | PRN
  Filled 2013-01-05: qty 5

## 2013-01-05 MED ORDER — PROMETHAZINE HCL 25 MG/ML IJ SOLN
6.2500 mg | INTRAMUSCULAR | Status: DC | PRN
  Filled 2013-01-05: qty 1

## 2013-01-05 MED ORDER — MEPERIDINE HCL 25 MG/ML IJ SOLN
6.2500 mg | INTRAMUSCULAR | Status: DC | PRN
  Filled 2013-01-05: qty 1

## 2013-01-05 MED ORDER — LACTATED RINGERS IV SOLN
INTRAVENOUS | Status: DC
  Administered 2013-01-05 (×2): via INTRAVENOUS
  Filled 2013-01-05: qty 1000

## 2013-01-05 MED ORDER — HYDROMORPHONE HCL PF 1 MG/ML IJ SOLN
0.2500 mg | INTRAMUSCULAR | Status: DC | PRN
  Filled 2013-01-05: qty 1

## 2013-01-05 MED ORDER — CEFAZOLIN SODIUM 1-5 GM-% IV SOLN
1.0000 g | INTRAVENOUS | Status: DC
  Filled 2013-01-05: qty 50

## 2013-01-05 MED ORDER — OXYCODONE HCL 5 MG PO TABS
5.0000 mg | ORAL_TABLET | Freq: Once | ORAL | Status: DC | PRN
  Filled 2013-01-05: qty 1

## 2013-01-05 MED ORDER — LIDOCAINE HCL (CARDIAC) 20 MG/ML IV SOLN
INTRAVENOUS | Status: DC | PRN
  Administered 2013-01-05: 50 mg via INTRAVENOUS

## 2013-01-05 MED ORDER — DEXAMETHASONE SODIUM PHOSPHATE 4 MG/ML IJ SOLN
INTRAMUSCULAR | Status: DC | PRN
  Administered 2013-01-05: 8 mg via INTRAVENOUS

## 2013-01-05 MED ORDER — FENTANYL CITRATE 0.05 MG/ML IJ SOLN
INTRAMUSCULAR | Status: DC | PRN
  Administered 2013-01-05 (×3): 25 ug via INTRAVENOUS
  Administered 2013-01-05: 50 ug via INTRAVENOUS
  Administered 2013-01-05 (×3): 25 ug via INTRAVENOUS

## 2013-01-05 SURGICAL SUPPLY — 29 items
BAG DRAIN URO-CYSTO SKYTR STRL (DRAIN) ×2 IMPLANT
BAG URINE DRAINAGE (UROLOGICAL SUPPLIES) ×2 IMPLANT
BAG URINE LEG 19OZ MD ST LTX (BAG) IMPLANT
CANISTER SUCT LVC 12 LTR MEDI- (MISCELLANEOUS) ×4 IMPLANT
CATH FOLEY 2WAY SLVR  5CC 20FR (CATHETERS)
CATH FOLEY 2WAY SLVR  5CC 22FR (CATHETERS)
CATH FOLEY 2WAY SLVR 5CC 20FR (CATHETERS) IMPLANT
CATH FOLEY 2WAY SLVR 5CC 22FR (CATHETERS) IMPLANT
CLOTH BEACON ORANGE TIMEOUT ST (SAFETY) ×2 IMPLANT
DRAPE CAMERA CLOSED 9X96 (DRAPES) IMPLANT
ELECT LOOP HF 26F 30D .35MM (CUTTING LOOP) IMPLANT
ELECT REM PT RETURN 9FT ADLT (ELECTROSURGICAL) ×2
ELECTRODE REM PT RTRN 9FT ADLT (ELECTROSURGICAL) ×1 IMPLANT
EVACUATOR MICROVAS BLADDER (UROLOGICAL SUPPLIES) IMPLANT
GLOVE BIO SURGEON STRL SZ7 (GLOVE) ×6 IMPLANT
GOWN STRL NON-REIN LRG LVL3 (GOWN DISPOSABLE) ×4 IMPLANT
HOLDER FOLEY CATH W/STRAP (MISCELLANEOUS) ×2 IMPLANT
KIT ASPIRATION TUBING (SET/KITS/TRAYS/PACK) ×2 IMPLANT
LOOP CUTTING 24FR OLYMPUS (CUTTING LOOP) IMPLANT
NDL SAFETY ECLIPSE 18X1.5 (NEEDLE) IMPLANT
NEEDLE HYPO 18GX1.5 SHARP (NEEDLE)
NEEDLE HYPO 22GX1.5 SAFETY (NEEDLE) IMPLANT
NS IRRIG 500ML POUR BTL (IV SOLUTION) IMPLANT
PACK CYSTOSCOPY (CUSTOM PROCEDURE TRAY) ×2 IMPLANT
PLUG CATH AND CAP STER (CATHETERS) IMPLANT
SET ASPIRATION TUBING (TUBING) IMPLANT
SYR 20CC LL (SYRINGE) IMPLANT
SYRINGE IRR TOOMEY STRL 70CC (SYRINGE) ×2 IMPLANT
WATER STERILE IRR 3000ML UROMA (IV SOLUTION) ×2 IMPLANT

## 2013-01-05 NOTE — H&P (Signed)
History of Present Illness  Victoria Burke was seen in consultation a week ago for gross hematuria.  CT scan showed hematuria protocol showed a filling defect in the bladder.  She has not seen any more blood in the urine.  She is a smoker.  She needs cystoscopy.   Past Medical History Problems  1. History of  Anxiety (Symptom) 300.00 2. History of  Arthritis V13.4 3. History of  Breast Cancer V10.3 4. History of  Depression 311 5. History of  Gout 274.9 6. History of  Heartburn 787.1 7. History of  Hypertension 401.9 8. History of  Peptic Ulcer V12.71  Surgical History Problems  1. History of  Breast Surgery Lumpectomy Left 2. History of  Hysterectomy V45.77  Current Meds 1. AmLODIPine Besylate TABS; Therapy: (Recorded:25Jun2014) to 2. Anastrozole 1 MG Oral Tablet; Therapy: (Recorded:25Jun2014) to 3. Cefuroxime Axetil TABS; Therapy: (Recorded:25Jun2014) to 4. Gabapentin CAPS; Therapy: (Recorded:25Jun2014) to  Allergies Medication  1. No Known Drug Allergies  Family History Problems  1. Family history of  Family Health Status Number Of Children 2 sons 2. Family history of  Father Deceased At Age ____ 3. Family history of  Mother Deceased At Age ____  Social History Problems  1. Caffeine Use 2. Currently On Disability 3. Marital History - Separated 4. Tobacco Use 305.1 1 pk for 16 yrs  Review of Systems Genitourinary, constitutional, skin, eye, otolaryngeal, hematologic/lymphatic, cardiovascular, pulmonary, endocrine, musculoskeletal, gastrointestinal, neurological and psychiatric system(s) were reviewed and pertinent findings if present are noted.    Vitals Vital Signs [Data Includes: Last 1 Day]  25Jun2014 08:38AM  BMI Calculated: 22.82 BSA Calculated: 1.56 Height: 5 ft 2 in Weight: 124 lb  Blood Pressure: 151 / 88 Temperature: 97.9 F Heart Rate: 64 Respiration: 18  Physical Exam Constitutional: Well nourished and well developed . No acute distress.  ENT:. The  ears and nose are normal in appearance.  Neck: The appearance of the neck is normal and no neck mass is present.  Pulmonary: No respiratory distress and normal respiratory rhythm and effort.  Cardiovascular: Heart rate and rhythm are normal . No peripheral edema.  Abdomen: The abdomen is soft and nontender. No masses are palpated. No CVA tenderness. No hernias are palpable. No hepatosplenomegaly noted.  Genitourinary:  Chaperone Present: .  Examination of the external genitalia shows normal female external genitalia and no lesions. The urethra is normal in appearance and not tender. There is no urethral mass. Vaginal exam demonstrates no abnormalities. The cervix is is absent. The uterus is absent. The adnexa are palpably normal. The bladder is non tender and not distended. The anus is normal on inspection. The perineum is normal on inspection.  Lymphatics: The femoral and inguinal nodes are not enlarged or tender.  Skin: Normal skin turgor, no visible rash and no visible skin lesions.  Neuro/Psych:. Mood and affect are appropriate.    Results/Data Urine [Data Includes: Last 1 Day]   25Jun2014  COLOR YELLOW   APPEARANCE CLEAR   SPECIFIC GRAVITY 1.020   pH 6.0   GLUCOSE NEG mg/dL  BILIRUBIN NEG   KETONE NEG mg/dL  BLOOD LARGE   PROTEIN 100 mg/dL  UROBILINOGEN 0.2 mg/dL  NITRITE NEG   LEUKOCYTE ESTERASE NEG   SQUAMOUS EPITHELIAL/HPF RARE   WBC 3-6 WBC/hpf  RBC 11-20 RBC/hpf  BACTERIA MODERATE   CRYSTALS Calcium Oxalate crystals noted   CASTS NONE SEEN    Procedure  Procedure: Cystoscopy  Chaperone Present: Sima Matas.  Indication: Hematuria. Bladder Mass.  Informed Consent: Risks, benefits, and potential adverse events were discussed and informed consent was obtained from the patient . Specific risks including, but not limited to bleeding, infection, pain, allergic reaction etc. were explained.  Prep: The patient was prepped with betadine.  Anesthesia:. Local anesthesia was  administered intraurethrally with 2% lidocaine jelly.  Antibiotic prophylaxis: Ciprofloxacin.  Procedure Note:  Urethral meatus:. No abnormalities.  Anterior urethra: No abnormalities.  Bladder: Visulization was clear. The right ureteral orifice was in the normal anatomic position. The left ureteral orifice was not able to be identified. A solitary tumor was visualized in the bladder. A papillary tumor was seen in the bladder measuring approximately 5 cm in size. This tumor was located on the left side, at the base of the bladder.    Assessment Assessed  1. Bladder Cancer 188.9 2. Urinary Tract Infection 599.0  Plan Health Maintenance (V70.0)  1. UA With REFLEX  Done: 25Jun2014 07:59AM      Urine culture.  Cipro 250 mgm x 1.  She needs TURBT. The procedure, risks, benefits were explained to the patient.  The risks include but are not limited to hemorrhage, infection, bladder injury. She understands and wishes to proceed.

## 2013-01-05 NOTE — Anesthesia Preprocedure Evaluation (Signed)
Anesthesia Evaluation  Patient identified by MRN, date of birth, ID band Patient awake    Reviewed: Allergy & Precautions, H&P , NPO status , Patient's Chart, lab work & pertinent test results  History of Anesthesia Complications Negative for: history of anesthetic complications  Airway Mallampati: II TM Distance: >3 FB Neck ROM: Full    Dental  (+) Poor Dentition, Loose, Missing, Chipped and Dental Advisory Given   Pulmonary neg shortness of breath, asthma , COPD COPD inhaler, Current Smoker,  breath sounds clear to auscultation  Pulmonary exam normal       Cardiovascular hypertension, Pt. on medications Rhythm:Regular Rate:Normal     Neuro/Psych PSYCHIATRIC DISORDERS Depression negative neurological ROS     GI/Hepatic Neg liver ROS, GERD-  Controlled,  Endo/Other  negative endocrine ROS  Renal/GU negative Renal ROS     Musculoskeletal   Abdominal   Peds  Hematology   Anesthesia Other Findings   Reproductive/Obstetrics negative OB ROS                           Anesthesia Physical  Anesthesia Plan  ASA: II  Anesthesia Plan: General   Post-op Pain Management:    Induction: Intravenous  Airway Management Planned: LMA  Additional Equipment:   Intra-op Plan:   Post-operative Plan: Extubation in OR  Informed Consent: I have reviewed the patients History and Physical, chart, labs and discussed the procedure including the risks, benefits and alternatives for the proposed anesthesia with the patient or authorized representative who has indicated his/her understanding and acceptance.   Dental advisory given  Plan Discussed with: CRNA  Anesthesia Plan Comments:         Anesthesia Quick Evaluation

## 2013-01-05 NOTE — Op Note (Signed)
Victoria Burke is a 62 y.o.   01/05/2013  General  Preop diagnosis: Bladder tumor  Postop diagnosis: Same  Procedure done: Cystoscopy, TUR bladder tumor, instillation of mitomycin C. in the bladder.  Surgeon: Wendie Simmer. Jahki Witham  Anesthesia: General  Indication: Patient is a 62 years old female who was seen in consultation about 2 weeks ago for gross hematuria. CT scan showed normal kidneys and a filling defect on the left lateral wall of the bladder at the base. Cystoscopy showed a papillary tumor on the left side of the bladder near the bladder neck that measures about 5 cm. She is scheduled today for TUR bladder tumor  Procedure: Patient was identified by her wrist band and proper timeout was taken.  Under general anesthesia she was prepped and draped and placed in the dorsolithotomy position. 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 The meatus was then dilated up to #28 [place] with female sounds. Then the panendoscope was inserted in the bladder. There is a larger tumor on the left lateral wall of the bladder at the base near the bladder neck. The tumor measures at least 5 cm. The right ureteral orifice was identified but I could not visualize the left ureteral orifice because of the location of the tumor. There was no evidence of other tumor in the bladder.  A gyrus resectoscope was then inserted in the bladder. Resection of the bladder tumor was then done. The resection was tedious because of the size and the location of the tumor. All tumor was removed. The specimen was then irrigated out of the bladder. Then the resection of the base of the bladder tumor was done for staging purposes and the base of the tumor was sent to pathology. Hemostasis was then secured with electrocautery. At this time I was able to visualize the left ureteral orifice. It was  above the tumor. And the ureteral orifice was not involved in the resection. The margins of resection of  bladder tumor were then fulgurated over a 2 cm margin. Hemostasis was completed with electrocautery. There was no bleeding at the end of the procedure.  The cystoscope was removed.  A #20 French Foley catheter was then inserted in the bladder. 40 mg of mitomycin-C in 40 cc of water were then instilled in the bladder.  The mitomycin will be left in the bladder for about 30-45 minutes.  EBL: 50 cc  The patient tolerated the procedure well and left the OR in satisfactory condition to postanesthesia care unit.  CC: Dr. Raymond Gurney Magrinat         Dr Jackie Plum

## 2013-01-05 NOTE — Anesthesia Procedure Notes (Signed)
Procedure Name: LMA Insertion Date/Time: 01/05/2013 10:48 AM Performed by: Renella Cunas D Pre-anesthesia Checklist: Patient identified, Emergency Drugs available, Suction available and Patient being monitored Patient Re-evaluated:Patient Re-evaluated prior to inductionOxygen Delivery Method: Circle System Utilized Preoxygenation: Pre-oxygenation with 100% oxygen Intubation Type: IV induction Ventilation: Mask ventilation without difficulty LMA: LMA inserted LMA Size: 4.0 Number of attempts: 1 Airway Equipment and Method: bite block Placement Confirmation: positive ETCO2 Tube secured with: Tape Dental Injury: Teeth and Oropharynx as per pre-operative assessment

## 2013-01-05 NOTE — Transfer of Care (Signed)
Immediate Anesthesia Transfer of Care Note  Patient: Victoria Burke  Procedure(s) Performed: Procedure(s) (LRB): TRANSURETHRAL RESECTION OF BLADDER TUMOR (TURBT) (N/A) CYSTOSCOPY (N/A)  Patient Location: PACU  Anesthesia Type: General  Level of Consciousness: awake, oriented, sedated and patient cooperative  Airway & Oxygen Therapy: Patient Spontanous Breathing and Patient connected to face mask oxygen  Post-op Assessment: Report given to PACU RN and Post -op Vital signs reviewed and stable  Post vital signs: Reviewed and stable  Complications: No apparent anesthesia complications 

## 2013-01-05 NOTE — Anesthesia Postprocedure Evaluation (Signed)
Anesthesia Post Note  Patient: Victoria Burke  Procedure(s) Performed: Procedure(s) (LRB): TRANSURETHRAL RESECTION OF BLADDER TUMOR (TURBT) (N/A) CYSTOSCOPY (N/A)  Anesthesia type: General  Patient location: PACU  Post pain: Pain level controlled  Post assessment: Post-op Vital signs reviewed  Last Vitals: BP 172/94  Pulse 64  Temp(Src) 36.4 C (Oral)  Resp 16  Ht 5\' 2"  (1.575 m)  Wt 122 lb 5 oz (55.481 kg)  BMI 22.37 kg/m2  SpO2 95%  Post vital signs: Reviewed  Level of consciousness: sedated  Complications: No apparent anesthesia complications

## 2013-01-11 ENCOUNTER — Encounter (HOSPITAL_BASED_OUTPATIENT_CLINIC_OR_DEPARTMENT_OTHER): Payer: Self-pay | Admitting: Urology

## 2013-01-12 ENCOUNTER — Ambulatory Visit
Admission: RE | Admit: 2013-01-12 | Discharge: 2013-01-12 | Disposition: A | Discharge status: Home / Self Care | Source: Ambulatory Visit | Attending: Radiation Oncology | Admitting: Radiation Oncology

## 2013-01-12 ENCOUNTER — Encounter: Payer: Self-pay | Admitting: Radiation Oncology

## 2013-01-12 VITALS — BP 161/107 | HR 68 | Temp 98.2°F | Resp 18 | Wt 124.4 lb

## 2013-01-12 DIAGNOSIS — C50412 Malignant neoplasm of upper-outer quadrant of left female breast: Secondary | ICD-10-CM

## 2013-01-12 NOTE — Progress Notes (Signed)
Last seen by Dr. Welton Flakes 09/08/12. No follow up with Dr. Welton Flakes scheduled. Taking arimidex daily as directed. Reports faint hyperpigmentation of left breast. Reports edema of left breast. Reports using baby lotion to moisturize left breast. Denies nipple discharge. Reports intermittent weakness. Denies burning or bleeding with urination. Will have second bladder surgery in six weeks in an effort to obtain clear margins. Reports diarrhea x1 month defecating 2-3 times per day. Blood pressure elevated. Patient reports she is out of her norvasc and can't get in to see PCP until 01/25/2013.

## 2013-01-12 NOTE — Addendum Note (Signed)
Encounter addended by: Maryln Gottron, MD on: 01/12/2013  4:18 PM<BR>     Documentation filed: Notes Section

## 2013-01-12 NOTE — Progress Notes (Addendum)
CC: Dr. Drue Second, Dr. Su Grand, Dr. 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 She last saw Dr. Welton Flakes in March and Mr. followup appointment. She is on Arimidex. She has not had followup mammography. She has not seen Dr. Jaclynn Guarneri. She was recently diagnosed with high-grade superficial urothelial carcinoma on July 1 and is being looked after by Dr. Su Grand. She saw Dr. Brunilda Payor earlier today and stopped by to see me this afternoon. She is without complaints today. Physical examination: Alert and oriented. Wt Readings from Last 3 Encounters:  01/12/13 124 lb 6.4 oz (56.427 kg)  01/05/13 122 lb 5 oz (55.481 kg)  01/05/13 122 lb 5 oz (55.481 kg)   Temp Readings from Last 3 Encounters:  01/12/13 98.2 F (36.8 C) Oral  01/05/13 97.5 F (36.4 C) Oral  01/05/13 97.5 F (36.4 C) Oral   BP Readings from Last 3 Encounters:  01/12/13 161/107  01/05/13 172/94  01/05/13 172/94   Pulse Readings from Last 3 Encounters:  01/12/13 68  01/05/13 64  01/05/13 64   Head and neck examination: Grossly unremarkable. Nodes: Without palpable cervical, supraclavicular, or axillary lymphadenopathy. Breasts: There is residual hyperpigmentation of the skin along the left breast. There is moderate thickening of left breast as expected. No masses are appreciated. Right breast without masses or lesions. Abdomen without hepatomegaly. Extremities: Without edema.  Impression: Satisfactory progress from a breast cancer standpoint. [facility] would [facility] All also have our staff get her scheduled see Dr. Welton Flakes for a followup visit this fall.  Plan: As discussed above. She'll also maintain her followup with Dr. Su Grand with her recent diagnosis of bladder cancer. I told her that as long she sees  Dr. Welton Flakes she does not need to return to see me for a formal followup visit.  Note that her current cell ph # is [phone].

## 2013-01-13 ENCOUNTER — Telehealth: Payer: Self-pay | Admitting: Oncology

## 2013-01-13 ENCOUNTER — Telehealth: Payer: Self-pay | Admitting: *Deleted

## 2013-01-13 NOTE — Telephone Encounter (Signed)
Called patient to inform of test and fu visit with Dr. Welton Flakes, spoke with patient and she is aware of these appts.

## 2013-01-13 NOTE — Telephone Encounter (Signed)
, °

## 2013-01-19 ENCOUNTER — Encounter (HOSPITAL_BASED_OUTPATIENT_CLINIC_OR_DEPARTMENT_OTHER): Payer: Self-pay | Admitting: *Deleted

## 2013-01-19 ENCOUNTER — Other Ambulatory Visit: Payer: Self-pay | Admitting: Urology

## 2013-01-19 NOTE — Progress Notes (Signed)
NPO AFTER MN. ARRIVES AT 0900 CURRENT ISTAT AND EKG IN EPIC AND CHART. PT OUT OF REFILLS AND NO RX TO REFILL FOR REGULAR MEDS. SO, WILL NOT BE TAKING NORVASC AM OF SURG  UNLESS SHE GETS THEM FILLED.

## 2013-01-22 ENCOUNTER — Encounter

## 2013-01-26 ENCOUNTER — Ambulatory Visit (HOSPITAL_BASED_OUTPATIENT_CLINIC_OR_DEPARTMENT_OTHER): Admitting: Anesthesiology

## 2013-01-26 ENCOUNTER — Encounter (HOSPITAL_BASED_OUTPATIENT_CLINIC_OR_DEPARTMENT_OTHER)
Admission: RE | Disposition: A | Discharge status: Home / Self Care | Payer: Self-pay | Source: Ambulatory Visit | Attending: Urology

## 2013-01-26 ENCOUNTER — Encounter (HOSPITAL_BASED_OUTPATIENT_CLINIC_OR_DEPARTMENT_OTHER): Payer: Self-pay | Admitting: Anesthesiology

## 2013-01-26 ENCOUNTER — Ambulatory Visit (HOSPITAL_BASED_OUTPATIENT_CLINIC_OR_DEPARTMENT_OTHER)
Admission: RE | Admit: 2013-01-26 | Discharge: 2013-01-26 | Disposition: A | Discharge status: Home / Self Care | Source: Ambulatory Visit | Attending: Urology | Admitting: Urology

## 2013-01-26 ENCOUNTER — Encounter (HOSPITAL_BASED_OUTPATIENT_CLINIC_OR_DEPARTMENT_OTHER): Payer: Self-pay | Admitting: *Deleted

## 2013-01-26 DIAGNOSIS — Z79899 Other long term (current) drug therapy: Secondary | ICD-10-CM | POA: Insufficient documentation

## 2013-01-26 DIAGNOSIS — Z853 Personal history of malignant neoplasm of breast: Secondary | ICD-10-CM | POA: Insufficient documentation

## 2013-01-26 DIAGNOSIS — J449 Chronic obstructive pulmonary disease, unspecified: Secondary | ICD-10-CM | POA: Insufficient documentation

## 2013-01-26 DIAGNOSIS — J4489 Other specified chronic obstructive pulmonary disease: Secondary | ICD-10-CM | POA: Insufficient documentation

## 2013-01-26 DIAGNOSIS — I10 Essential (primary) hypertension: Secondary | ICD-10-CM | POA: Insufficient documentation

## 2013-01-26 DIAGNOSIS — C679 Malignant neoplasm of bladder, unspecified: Secondary | ICD-10-CM | POA: Insufficient documentation

## 2013-01-26 DIAGNOSIS — K219 Gastro-esophageal reflux disease without esophagitis: Secondary | ICD-10-CM | POA: Insufficient documentation

## 2013-01-26 HISTORY — DX: Malignant neoplasm of urinary organ, unspecified: C68.9

## 2013-01-26 HISTORY — PX: CYSTOSCOPY: SHX5120

## 2013-01-26 HISTORY — PX: TRANSURETHRAL RESECTION OF BLADDER TUMOR: SHX2575

## 2013-01-26 LAB — POCT I-STAT 4, (NA,K, GLUC, HGB,HCT)
Glucose, Bld: 109 mg/dL — ABNORMAL HIGH (ref 70–99)
HCT: 44 % (ref 36.0–46.0)
Hemoglobin: 15 g/dL (ref 12.0–15.0)
Potassium: 3.4 mEq/L — ABNORMAL LOW (ref 3.5–5.1)

## 2013-01-26 SURGERY — TURBT (TRANSURETHRAL RESECTION OF BLADDER TUMOR)
Anesthesia: General | Site: Bladder | Wound class: Clean Contaminated

## 2013-01-26 MED ORDER — FENTANYL CITRATE 0.05 MG/ML IJ SOLN
INTRAMUSCULAR | Status: DC | PRN
  Administered 2013-01-26 (×3): 25 ug via INTRAVENOUS
  Administered 2013-01-26: 50 ug via INTRAVENOUS
  Administered 2013-01-26: 25 ug via INTRAVENOUS

## 2013-01-26 MED ORDER — LACTATED RINGERS IV SOLN
INTRAVENOUS | Status: DC
  Administered 2013-01-26 (×2): via INTRAVENOUS
  Filled 2013-01-26: qty 1000

## 2013-01-26 MED ORDER — PHENAZOPYRIDINE HCL 100 MG PO TABS
100.0000 mg | ORAL_TABLET | Freq: Three times a day (TID) | ORAL | Status: AC
  Administered 2013-01-26: 100 mg via ORAL
  Filled 2013-01-26: qty 1

## 2013-01-26 MED ORDER — MIDAZOLAM HCL 5 MG/5ML IJ SOLN
INTRAMUSCULAR | Status: DC | PRN
  Administered 2013-01-26: 2 mg via INTRAVENOUS

## 2013-01-26 MED ORDER — CEFAZOLIN SODIUM-DEXTROSE 2-3 GM-% IV SOLR
2.0000 g | INTRAVENOUS | Status: AC
  Administered 2013-01-26: 2 g via INTRAVENOUS
  Filled 2013-01-26: qty 50

## 2013-01-26 MED ORDER — OXYBUTYNIN CHLORIDE 5 MG PO TABS
5.0000 mg | ORAL_TABLET | Freq: Three times a day (TID) | ORAL | Status: AC
  Administered 2013-01-26: 5 mg via ORAL
  Filled 2013-01-26: qty 1

## 2013-01-26 MED ORDER — PROPOFOL 10 MG/ML IV BOLUS
INTRAVENOUS | Status: DC | PRN
  Administered 2013-01-26: 150 mg via INTRAVENOUS

## 2013-01-26 MED ORDER — ONDANSETRON HCL 4 MG/2ML IJ SOLN
INTRAMUSCULAR | Status: DC | PRN
  Administered 2013-01-26: 4 mg via INTRAVENOUS

## 2013-01-26 MED ORDER — SODIUM CHLORIDE 0.9 % IR SOLN
Status: DC | PRN
  Administered 2013-01-26: 9000 mL

## 2013-01-26 MED ORDER — VARENICLINE TARTRATE 0.5 MG PO TABS: ORAL_TABLET | ORAL | Status: DC

## 2013-01-26 MED ORDER — LIDOCAINE HCL (CARDIAC) 20 MG/ML IV SOLN
INTRAVENOUS | Status: DC | PRN
  Administered 2013-01-26: 50 mg via INTRAVENOUS

## 2013-01-26 MED ORDER — CEFAZOLIN SODIUM 1-5 GM-% IV SOLN
1.0000 g | INTRAVENOUS | Status: DC
  Filled 2013-01-26: qty 50

## 2013-01-26 MED ORDER — FENTANYL CITRATE 0.05 MG/ML IJ SOLN
25.0000 ug | INTRAMUSCULAR | Status: DC | PRN
  Administered 2013-01-26: 25 ug via INTRAVENOUS
  Filled 2013-01-26: qty 1

## 2013-01-26 MED ORDER — DEXAMETHASONE SODIUM PHOSPHATE 4 MG/ML IJ SOLN
INTRAMUSCULAR | Status: DC | PRN
  Administered 2013-01-26: 8 mg via INTRAVENOUS

## 2013-01-26 MED ORDER — PROMETHAZINE HCL 25 MG/ML IJ SOLN
6.2500 mg | INTRAMUSCULAR | Status: DC | PRN
  Filled 2013-01-26: qty 1

## 2013-01-26 SURGICAL SUPPLY — 34 items
BAG DRAIN URO-CYSTO SKYTR STRL (DRAIN) ×2 IMPLANT
BAG URINE DRAINAGE (UROLOGICAL SUPPLIES) ×2 IMPLANT
BAG URINE LEG 19OZ MD ST LTX (BAG) IMPLANT
CANISTER SUCT LVC 12 LTR MEDI- (MISCELLANEOUS) ×4 IMPLANT
CATH FOLEY 2WAY SLVR  5CC 16FR (CATHETERS) ×1
CATH FOLEY 2WAY SLVR  5CC 20FR (CATHETERS)
CATH FOLEY 2WAY SLVR  5CC 22FR (CATHETERS)
CATH FOLEY 2WAY SLVR 5CC 16FR (CATHETERS) ×1 IMPLANT
CATH FOLEY 2WAY SLVR 5CC 20FR (CATHETERS) IMPLANT
CATH FOLEY 2WAY SLVR 5CC 22FR (CATHETERS) IMPLANT
CLOTH BEACON ORANGE TIMEOUT ST (SAFETY) ×2 IMPLANT
DRAPE CAMERA CLOSED 9X96 (DRAPES) ×2 IMPLANT
ELECT LOOP HF 26F 30D .35MM (CUTTING LOOP) IMPLANT
ELECT LOOP MED HF 24F 12D CBL (CLIP) ×2 IMPLANT
ELECT REM PT RETURN 9FT ADLT (ELECTROSURGICAL)
ELECTRODE REM PT RTRN 9FT ADLT (ELECTROSURGICAL) IMPLANT
EVACUATOR MICROVAS BLADDER (UROLOGICAL SUPPLIES) ×2 IMPLANT
GLOVE BIO SURGEON STRL SZ7 (GLOVE) ×2 IMPLANT
GLOVE BIO SURGEON STRL SZ7.5 (GLOVE) ×2 IMPLANT
GLOVE INDICATOR 7.5 STRL GRN (GLOVE) ×4 IMPLANT
HOLDER FOLEY CATH W/STRAP (MISCELLANEOUS) ×2 IMPLANT
IV NS IRRIG 3000ML ARTHROMATIC (IV SOLUTION) ×4 IMPLANT
KIT ASPIRATION TUBING (SET/KITS/TRAYS/PACK) ×2 IMPLANT
LOOP CUTTING 24FR OLYMPUS (CUTTING LOOP) IMPLANT
NDL SAFETY ECLIPSE 18X1.5 (NEEDLE) IMPLANT
NEEDLE HYPO 18GX1.5 SHARP (NEEDLE)
NEEDLE HYPO 22GX1.5 SAFETY (NEEDLE) IMPLANT
NS IRRIG 500ML POUR BTL (IV SOLUTION) IMPLANT
PACK CYSTOSCOPY (CUSTOM PROCEDURE TRAY) ×2 IMPLANT
PLUG CATH AND CAP STER (CATHETERS) IMPLANT
SET ASPIRATION TUBING (TUBING) IMPLANT
SYR 20CC LL (SYRINGE) IMPLANT
SYRINGE IRR TOOMEY STRL 70CC (SYRINGE) IMPLANT
WATER STERILE IRR 3000ML UROMA (IV SOLUTION) ×2 IMPLANT

## 2013-01-26 NOTE — H&P (Signed)
  History of Present Illness  Victoria Burke had TURBT on 7/1.  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  She is doing well.  She has not seen any more blood in her urine.  She voids well.   Past Medical History Problems  1. History of  Anxiety (Symptom) 300.00 2. History of  Arthritis V13.4 3. History of  Breast Cancer V10.3 4. History of  Depression 311 5. History of  Gout 274.9 6. History of  Heartburn 787.1 7. History of  Hypertension 401.9 8. History of  Peptic Ulcer V12.71  Surgical History Problems  1. History of  Bladder Injection Of Cancer Treatment 2. History of  Breast Surgery Lumpectomy Left 3. History of  Cystoscopy With Fulguration Large Lesion (Over 5cm) 4. History of  Hysterectomy V45.77  Current Meds 1. AmLODIPine Besylate TABS; Therapy: (Recorded:25Jun2014) to 2. Anastrozole 1 MG Oral Tablet; Therapy: (Recorded:25Jun2014) to 3. Cefuroxime Axetil TABS; Therapy: (Recorded:25Jun2014) to 4. Gabapentin CAPS; Therapy: (Recorded:25Jun2014) to  Allergies Medication  1. No Known Drug Allergies  Family History Problems  1. Family history of  Family Health Status Number Of Children 2 sons 2. Family history of  Father Deceased At Age ____ 3. Family history of  Mother Deceased At Age ____  Social History Problems  1. Caffeine Use 2. Currently On Disability 3. Marital History - Separated 4. Tobacco Use 305.1 1 pk for 16 yrs  Review of Systems Genitourinary, constitutional, skin, eye, otolaryngeal, hematologic/lymphatic, cardiovascular, pulmonary, endocrine, musculoskeletal, gastrointestinal, neurological and psychiatric system(s) were reviewed and pertinent findings if present are noted.    Vitals Vital Signs [Data Includes: Last 1 Day]  08Jul2014 01:50PM  Blood Pressure: 163 / 91 Temperature: 98.4 F Heart Rate: 68 Respiration: 18  Physical Exam Constitutional: Well nourished and well developed .  No acute distress.  ENT:. The ears and nose are normal in appearance.  Neck: The appearance of the neck is normal and no neck mass is present.  Pulmonary: No respiratory distress and normal respiratory rhythm and effort.  Cardiovascular: Heart rate and rhythm are normal . No peripheral edema.  Abdomen: The abdomen is soft and nontender. No masses are palpated. No CVA tenderness. No hernias are palpable. No hepatosplenomegaly noted.  Genitourinary:  Chaperone Present: .  Examination of the external genitalia shows normal female external genitalia and no lesions. The urethra is normal in appearance and not tender. There is no urethral mass. Vaginal exam demonstrates no abnormalities. The adnexa are palpably normal. The bladder is non tender and not distended. The anus is normal on inspection. The perineum is normal on inspection.  Lymphatics: The femoral and inguinal nodes are not enlarged or tender.  Skin: Normal skin turgor, no visible rash and no visible skin lesions.  Neuro/Psych:. Mood and affect are appropriate.    Assessment Assessed  1. Bladder Cancer 188.9  Plan Bladder Cancer (188.9)  1. Follow-up Schedule Surgery Office  Follow-up  Requested for: 08Jul2014   Need to be sure she does not have any residual tumor.  Will schedule her for TUR bladder biopsy.  The procedure, risks, benefits were reviewed with the patient.  The risks include but are not limited to hemorrhage, infection, bladder injury.  She understands and wishes to proceed.  If she does not have residual tumor will start her on intravesical BCG.

## 2013-01-26 NOTE — Op Note (Signed)
Victoria Burke is a 63 y.o.   01/26/2013  General  Pre-op diagnosis: Rule out residual bladder tumor  Postop diagnosis: Same  Procedure done: Cystoscopy, TUR bladder biopsy  Surgeon: Wendie Simmer. Tericka Devincenzi  Anesthesia: General  Indication: Patient is a 62 years old female who had a TUR of a large bladder tumor. It showed a high-grade TCC with focal invasion of the lamina propria without muscle invasion. She is scheduled today for TUR bladder biopsy to rule out residual tumor.  Procedure: The patient was identified by her wrist band and proper timeout was taken.  Under general anesthesia she was prepped and draped and placed in the dorsolithotomy position. A panendoscope was inserted in the bladder. There is marked edema and inflammation at the left base of the bladder which is the site of resection of the tumor. There is no gross evidence of tumor in the bladder. There is some edema around the left ureteral orifice and it was difficult to identify the left ureteral orifice. The right ureteral orifice is in normal position and shape. The cystoscope was removed.  A gyrus resectoscope was then inserted in the bladder. Resection of the site of the resected bladder tumor was done. There was no evidence of gross tumor. Hemostasis was secured with electrocautery. The specimen were then irrigated out of the bladder and placed in 2 different containers. I placed what appears to be inflammatory tissues in one container and the base of the bladder tumor in a different container.  There was no bleeding at the end of the procedure.  The resectoscope was removed. A #16 Foley catheter was inserted in the bladder.  I again advised then to patient to quit smoking and she said she she is willing to quit but she needs some help. I gave her a prescription for Chantix.  The patient tolerated the procedure well and left the OR in satisfactory condition to postanesthesia care unit  EBL: Minimal  CC: Dr. Ruthann Cancer

## 2013-01-26 NOTE — Anesthesia Postprocedure Evaluation (Addendum)
  Anesthesia Post-op Note  Patient: Victoria Burke  Procedure(s) Performed: Procedure(s) (LRB): TRANSURETHRAL RESECTION OF BLADDER TUMOR (TURBT) (N/A) CYSTOSCOPY (N/A)  Patient Location: PACU  Anesthesia Type: General  Level of Consciousness: awake and alert   Airway and Oxygen Therapy: Patient Spontanous Breathing  Post-op Pain: mild  Post-op Assessment: Post-op Vital signs reviewed, Patient's Cardiovascular Status Stable, Respiratory Function Stable, Patent Airway and No signs of Nausea or vomiting  Last Vitals:  Filed Vitals:   01/26/13 1245  BP: 221/102  Pulse: 53  Temp:   Resp: 18    Post-op Vital Signs: stable   Complications: No apparent anesthesia complications. Blood pressure at baseline. She has not been taking her amlodipine. She was reminded of the importance of taking her blood pressure medicine. She has no symptoms with this blood pressure. She promised she would take her medicine in the future.  BP 197/103 at 1300

## 2013-01-26 NOTE — Anesthesia Preprocedure Evaluation (Addendum)
Anesthesia Evaluation  Patient identified by MRN, date of birth, ID band Patient awake    Reviewed: Allergy & Precautions, H&P , NPO status , Patient's Chart, lab work & pertinent test results  Airway Mallampati: II TM Distance: >3 FB Neck ROM: Full    Dental no notable dental hx. (+) Poor Dentition, Loose and Dental Advisory Given   Pulmonary shortness of breath, COPD breath sounds clear to auscultation  Pulmonary exam normal       Cardiovascular Exercise Tolerance: Good hypertension, Pt. on medications negative cardio ROS  Rhythm:Regular Rate:Normal     Neuro/Psych PSYCHIATRIC DISORDERS Depression negative neurological ROS     GI/Hepatic Neg liver ROS, GERD-  Medicated,  Endo/Other  negative endocrine ROS  Renal/GU negative Renal ROS  negative genitourinary   Musculoskeletal negative musculoskeletal ROS (+)   Abdominal   Peds negative pediatric ROS (+)  Hematology negative hematology ROS (+)   Anesthesia Other Findings   Reproductive/Obstetrics negative OB ROS                          Anesthesia Physical Anesthesia Plan  ASA: II  Anesthesia Plan: General   Post-op Pain Management:    Induction: Intravenous  Airway Management Planned: LMA  Additional Equipment:   Intra-op Plan:   Post-operative Plan: Extubation in OR  Informed Consent: I have reviewed the patients History and Physical, chart, labs and discussed the procedure including the risks, benefits and alternatives for the proposed anesthesia with the patient or authorized representative who has indicated his/her understanding and acceptance.   Dental advisory given  Plan Discussed with: CRNA  Anesthesia Plan Comments: (LMA 4 on 01-05-13)        Anesthesia Quick Evaluation

## 2013-01-26 NOTE — Anesthesia Procedure Notes (Signed)
Procedure Name: LMA Insertion Date/Time: 01/26/2013 11:06 AM Performed by: Renella Cunas D Pre-anesthesia Checklist: Patient identified, Emergency Drugs available, Suction available and Patient being monitored Patient Re-evaluated:Patient Re-evaluated prior to inductionOxygen Delivery Method: Circle System Utilized Preoxygenation: Pre-oxygenation with 100% oxygen Intubation Type: IV induction Ventilation: Mask ventilation without difficulty LMA: LMA inserted LMA Size: 4.0 Number of attempts: 1 Airway Equipment and Method: bite block Placement Confirmation: positive ETCO2 Tube secured with: Tape Dental Injury: Teeth and Oropharynx as per pre-operative assessment

## 2013-01-26 NOTE — Transfer of Care (Signed)
Immediate Anesthesia Transfer of Care Note  Patient: Victoria Burke  Procedure(s) Performed: Procedure(s) (LRB): TRANSURETHRAL RESECTION OF BLADDER TUMOR (TURBT) (N/A) CYSTOSCOPY (N/A)  Patient Location: PACU  Anesthesia Type: General  Level of Consciousness: awake, oriented, sedated and patient cooperative  Airway & Oxygen Therapy: Patient Spontanous Breathing and Patient connected to face mask oxygen  Post-op Assessment: Report given to PACU RN and Post -op Vital signs reviewed and stable  Post vital signs: Reviewed and stable  Complications: No apparent anesthesia complications

## 2013-01-26 NOTE — Progress Notes (Signed)
Dr. Brunilda Payor called reported patient needs prescription for pain medication, Dr. Brunilda Payor will call into pharmacy.

## 2013-01-27 ENCOUNTER — Encounter (HOSPITAL_BASED_OUTPATIENT_CLINIC_OR_DEPARTMENT_OTHER): Payer: Self-pay | Admitting: Urology

## 2013-03-05 ENCOUNTER — Ambulatory Visit
Admission: RE | Admit: 2013-03-05 | Discharge: 2013-03-05 | Disposition: A | Discharge status: Home / Self Care | Source: Ambulatory Visit | Attending: Radiation Oncology | Admitting: Radiation Oncology

## 2013-03-05 ENCOUNTER — Other Ambulatory Visit: Payer: Self-pay | Admitting: Radiation Oncology

## 2013-03-05 DIAGNOSIS — C50412 Malignant neoplasm of upper-outer quadrant of left female breast: Secondary | ICD-10-CM

## 2013-03-05 DIAGNOSIS — N6310 Unspecified lump in the right breast, unspecified quadrant: Secondary | ICD-10-CM

## 2013-04-06 ENCOUNTER — Telehealth: Payer: Self-pay | Admitting: Oncology

## 2013-04-19 ENCOUNTER — Ambulatory Visit: Admitting: Oncology

## 2013-08-05 ENCOUNTER — Other Ambulatory Visit: Payer: Self-pay | Admitting: Emergency Medicine

## 2013-08-05 MED ORDER — ANASTROZOLE 1 MG PO TABS: 1.0000 mg | ORAL_TABLET | Freq: Every day | ORAL | Status: DC

## 2013-10-25 ENCOUNTER — Other Ambulatory Visit: Payer: Self-pay | Admitting: Oncology

## 2013-10-25 DIAGNOSIS — C50419 Malignant neoplasm of upper-outer quadrant of unspecified female breast: Secondary | ICD-10-CM

## 2013-10-26 ENCOUNTER — Other Ambulatory Visit: Payer: Self-pay | Admitting: *Deleted

## 2013-10-26 DIAGNOSIS — C50919 Malignant neoplasm of unspecified site of unspecified female breast: Secondary | ICD-10-CM

## 2013-10-26 DIAGNOSIS — C50419 Malignant neoplasm of upper-outer quadrant of unspecified female breast: Secondary | ICD-10-CM

## 2013-10-29 ENCOUNTER — Telehealth: Payer: Self-pay | Admitting: Oncology

## 2013-10-29 NOTE — Telephone Encounter (Signed)
not able to reach pt or son at either number listed in EPIC. lmonvm (cell) for pt re 5/4 and 5/11 appts. schedule mailed.

## 2013-11-08 ENCOUNTER — Other Ambulatory Visit

## 2013-11-15 ENCOUNTER — Other Ambulatory Visit

## 2013-11-15 ENCOUNTER — Ambulatory Visit

## 2014-05-09 ENCOUNTER — Encounter (HOSPITAL_BASED_OUTPATIENT_CLINIC_OR_DEPARTMENT_OTHER): Payer: Self-pay | Admitting: Urology

## 2014-05-31 ENCOUNTER — Other Ambulatory Visit: Payer: Self-pay | Admitting: Oncology

## 2014-08-09 ENCOUNTER — Telehealth: Payer: Self-pay | Admitting: Hematology

## 2014-08-09 NOTE — Telephone Encounter (Signed)
, °

## 2014-08-12 ENCOUNTER — Other Ambulatory Visit: Payer: Self-pay | Admitting: *Deleted

## 2014-08-12 DIAGNOSIS — C50419 Malignant neoplasm of upper-outer quadrant of unspecified female breast: Secondary | ICD-10-CM

## 2014-08-15 ENCOUNTER — Ambulatory Visit (HOSPITAL_BASED_OUTPATIENT_CLINIC_OR_DEPARTMENT_OTHER): Admitting: Hematology

## 2014-08-15 ENCOUNTER — Encounter: Payer: Self-pay | Admitting: Hematology

## 2014-08-15 ENCOUNTER — Telehealth: Payer: Self-pay | Admitting: Hematology

## 2014-08-15 ENCOUNTER — Other Ambulatory Visit (HOSPITAL_BASED_OUTPATIENT_CLINIC_OR_DEPARTMENT_OTHER)

## 2014-08-15 VITALS — BP 135/81 | HR 88 | Temp 97.0°F | Resp 18 | Ht 62.0 in | Wt 126.9 lb

## 2014-08-15 DIAGNOSIS — C50912 Malignant neoplasm of unspecified site of left female breast: Secondary | ICD-10-CM

## 2014-08-15 DIAGNOSIS — C50419 Malignant neoplasm of upper-outer quadrant of unspecified female breast: Secondary | ICD-10-CM

## 2014-08-15 DIAGNOSIS — Z170 Estrogen receptor positive status [ER+]: Secondary | ICD-10-CM

## 2014-08-15 LAB — CBC WITH DIFFERENTIAL/PLATELET
BASO%: 0.9 % (ref 0.0–2.0)
Basophils Absolute: 0.1 10*3/uL (ref 0.0–0.1)
EOS ABS: 0.1 10*3/uL (ref 0.0–0.5)
EOS%: 1.5 % (ref 0.0–7.0)
HEMATOCRIT: 44.8 % (ref 34.8–46.6)
HGB: 14.8 g/dL (ref 11.6–15.9)
LYMPH%: 30.9 % (ref 14.0–49.7)
MCH: 30.3 pg (ref 25.1–34.0)
MCHC: 33 g/dL (ref 31.5–36.0)
MCV: 92 fL (ref 79.5–101.0)
MONO#: 0.6 10*3/uL (ref 0.1–0.9)
MONO%: 10 % (ref 0.0–14.0)
NEUT#: 3.2 10*3/uL (ref 1.5–6.5)
NEUT%: 56.7 % (ref 38.4–76.8)
PLATELETS: 323 10*3/uL (ref 145–400)
RBC: 4.87 10*6/uL (ref 3.70–5.45)
RDW: 13.8 % (ref 11.2–14.5)
WBC: 5.6 10*3/uL (ref 3.9–10.3)
lymph#: 1.7 10*3/uL (ref 0.9–3.3)

## 2014-08-15 LAB — COMPREHENSIVE METABOLIC PANEL (CC13)
ALT: 35 U/L (ref 0–55)
ANION GAP: 12 meq/L — AB (ref 3–11)
AST: 38 U/L — ABNORMAL HIGH (ref 5–34)
Albumin: 3.3 g/dL — ABNORMAL LOW (ref 3.5–5.0)
Alkaline Phosphatase: 119 U/L (ref 40–150)
BILIRUBIN TOTAL: 0.38 mg/dL (ref 0.20–1.20)
BUN: 19.6 mg/dL (ref 7.0–26.0)
CALCIUM: 9.5 mg/dL (ref 8.4–10.4)
CO2: 21 mEq/L — ABNORMAL LOW (ref 22–29)
CREATININE: 1 mg/dL (ref 0.6–1.1)
Chloride: 106 mEq/L (ref 98–109)
EGFR: 72 mL/min/{1.73_m2} — ABNORMAL LOW (ref >90)
Glucose: 101 mg/dl (ref 70–140)
POTASSIUM: 3.6 meq/L (ref 3.5–5.1)
Sodium: 140 mEq/L (ref 136–145)
TOTAL PROTEIN: 7.8 g/dL (ref 6.4–8.3)

## 2014-08-15 MED ORDER — ANASTROZOLE 1 MG PO TABS: 1.0000 mg | ORAL_TABLET | Freq: Every day | ORAL | Status: DC
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
## 2014-08-15 NOTE — Telephone Encounter (Signed)
Gave avs & calendar for [facility] May.

## 2014-08-16 ENCOUNTER — Other Ambulatory Visit: Payer: Self-pay | Admitting: Hematology

## 2014-08-16 ENCOUNTER — Telehealth: Payer: Self-pay | Admitting: *Deleted

## 2014-08-16 NOTE — Telephone Encounter (Signed)
Received e-refill requests  for Lisinopril, Norvasc, ProAir HFA, and Wellbutrin.  Spoke with Mickel Baas @ [facility] , and instructed her to contact pt's primary physician for refill of these meds.  Mickel Baas voiced understanding. [facility]  Phone    [phone].

## 2014-08-22 ENCOUNTER — Inpatient Hospital Stay: Admission: RE | Admit: 2014-08-22 | Source: Ambulatory Visit

## 2014-08-29 ENCOUNTER — Ambulatory Visit
Admission: RE | Admit: 2014-08-29 | Discharge: 2014-08-29 | Disposition: A | Discharge status: Home / Self Care | Source: Ambulatory Visit | Attending: Hematology | Admitting: Hematology

## 2014-08-29 DIAGNOSIS — C50419 Malignant neoplasm of upper-outer quadrant of unspecified female breast: Secondary | ICD-10-CM

## 2014-11-14 ENCOUNTER — Ambulatory Visit: Admitting: Hematology

## 2014-11-14 ENCOUNTER — Other Ambulatory Visit

## 2014-11-14 ENCOUNTER — Other Ambulatory Visit: Payer: Self-pay | Admitting: *Deleted

## 2014-11-15 ENCOUNTER — Telehealth: Payer: Self-pay | Admitting: Hematology

## 2014-11-15 NOTE — Telephone Encounter (Signed)
pe pof to sch pt appt-cld & spoke to pt and adv of time & date of appt-adv of DEXA time/date/location-pt understood

## 2014-11-18 ENCOUNTER — Ambulatory Visit (HOSPITAL_COMMUNITY)
Admission: RE | Admit: 2014-11-18 | Discharge: 2014-11-18 | Disposition: A | Discharge status: Home / Self Care | Source: Ambulatory Visit | Attending: Hematology | Admitting: Hematology

## 2014-11-18 DIAGNOSIS — Z780 Asymptomatic menopausal state: Secondary | ICD-10-CM | POA: Diagnosis not present

## 2014-11-18 DIAGNOSIS — C50419 Malignant neoplasm of upper-outer quadrant of unspecified female breast: Secondary | ICD-10-CM

## 2014-11-18 DIAGNOSIS — Z13820 Encounter for screening for osteoporosis: Secondary | ICD-10-CM | POA: Diagnosis not present

## 2014-11-22 ENCOUNTER — Other Ambulatory Visit (HOSPITAL_BASED_OUTPATIENT_CLINIC_OR_DEPARTMENT_OTHER)

## 2014-11-22 ENCOUNTER — Ambulatory Visit (HOSPITAL_BASED_OUTPATIENT_CLINIC_OR_DEPARTMENT_OTHER): Admitting: Hematology

## 2014-11-22 ENCOUNTER — Encounter: Payer: Self-pay | Admitting: Hematology

## 2014-11-22 ENCOUNTER — Telehealth: Payer: Self-pay | Admitting: Hematology

## 2014-11-22 VITALS — BP 202/98 | HR 71 | Temp 97.8°F | Resp 17 | Ht 62.0 in | Wt 127.2 lb

## 2014-11-22 DIAGNOSIS — Z170 Estrogen receptor positive status [ER+]: Secondary | ICD-10-CM | POA: Diagnosis not present

## 2014-11-22 DIAGNOSIS — C50912 Malignant neoplasm of unspecified site of left female breast: Secondary | ICD-10-CM

## 2014-11-22 DIAGNOSIS — I10 Essential (primary) hypertension: Secondary | ICD-10-CM

## 2014-11-22 DIAGNOSIS — C50419 Malignant neoplasm of upper-outer quadrant of unspecified female breast: Secondary | ICD-10-CM

## 2014-11-22 LAB — CBC & DIFF AND RETIC
BASO%: 0.2 % (ref 0.0–2.0)
Basophils Absolute: 0 10*3/uL (ref 0.0–0.1)
EOS ABS: 0.1 10*3/uL (ref 0.0–0.5)
EOS%: 1.8 % (ref 0.0–7.0)
HEMATOCRIT: 46 % (ref 34.8–46.6)
HGB: 15.6 g/dL (ref 11.6–15.9)
Immature Retic Fract: 4.8 % (ref 1.60–10.00)
LYMPH%: 32.8 % (ref 14.0–49.7)
MCH: 31.6 pg (ref 25.1–34.0)
MCHC: 33.9 g/dL (ref 31.5–36.0)
MCV: 93.1 fL (ref 79.5–101.0)
MONO#: 0.6 10*3/uL (ref 0.1–0.9)
MONO%: 10.6 % (ref 0.0–14.0)
NEUT%: 54.6 % (ref 38.4–76.8)
NEUTROS ABS: 3 10*3/uL (ref 1.5–6.5)
Platelets: 318 10*3/uL (ref 145–400)
RBC: 4.94 10*6/uL (ref 3.70–5.45)
RDW: 13 % (ref 11.2–14.5)
RETIC %: 1.77 % (ref 0.70–2.10)
RETIC CT ABS: 87.44 10*3/uL (ref 33.70–90.70)
WBC: 5.5 10*3/uL (ref 3.9–10.3)
lymph#: 1.8 10*3/uL (ref 0.9–3.3)

## 2014-11-22 LAB — COMPREHENSIVE METABOLIC PANEL (CC13)
ALT: 25 U/L (ref 0–55)
ANION GAP: 11 meq/L (ref 3–11)
AST: 27 U/L (ref 5–34)
Albumin: 2.8 g/dL — ABNORMAL LOW (ref 3.5–5.0)
Alkaline Phosphatase: 123 U/L (ref 40–150)
BILIRUBIN TOTAL: 0.22 mg/dL (ref 0.20–1.20)
BUN: 17.3 mg/dL (ref 7.0–26.0)
CO2: 24 meq/L (ref 22–29)
CREATININE: 0.8 mg/dL (ref 0.6–1.1)
Calcium: 9.8 mg/dL (ref 8.4–10.4)
Chloride: 107 mEq/L (ref 98–109)
EGFR: >90 mL/min/{1.73_m2} (ref >90)
Glucose: 75 mg/dl (ref 70–140)
Potassium: 3.6 mEq/L (ref 3.5–5.1)
Sodium: 142 mEq/L (ref 136–145)
TOTAL PROTEIN: 7.8 g/dL (ref 6.4–8.3)
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
## 2014-11-22 NOTE — Telephone Encounter (Signed)
Gave adn printed appt sched and avs fo rpt for NOV °

## 2015-05-09 ENCOUNTER — Emergency Department (HOSPITAL_COMMUNITY)

## 2015-05-09 ENCOUNTER — Emergency Department (HOSPITAL_COMMUNITY)
Admission: EM | Admit: 2015-05-09 | Discharge: 2015-05-09 | Disposition: A | Discharge status: Home / Self Care | Attending: Emergency Medicine | Admitting: Emergency Medicine

## 2015-05-09 ENCOUNTER — Encounter (HOSPITAL_COMMUNITY): Payer: Self-pay | Admitting: Cardiology

## 2015-05-09 DIAGNOSIS — Z8639 Personal history of other endocrine, nutritional and metabolic disease: Secondary | ICD-10-CM | POA: Insufficient documentation

## 2015-05-09 DIAGNOSIS — M25532 Pain in left wrist: Secondary | ICD-10-CM | POA: Diagnosis present

## 2015-05-09 DIAGNOSIS — Z79899 Other long term (current) drug therapy: Secondary | ICD-10-CM | POA: Diagnosis not present

## 2015-05-09 DIAGNOSIS — Z853 Personal history of malignant neoplasm of breast: Secondary | ICD-10-CM | POA: Insufficient documentation

## 2015-05-09 DIAGNOSIS — F329 Major depressive disorder, single episode, unspecified: Secondary | ICD-10-CM | POA: Diagnosis not present

## 2015-05-09 DIAGNOSIS — M109 Gout, unspecified: Secondary | ICD-10-CM | POA: Insufficient documentation

## 2015-05-09 DIAGNOSIS — J449 Chronic obstructive pulmonary disease, unspecified: Secondary | ICD-10-CM | POA: Diagnosis not present

## 2015-05-09 DIAGNOSIS — M1990 Unspecified osteoarthritis, unspecified site: Secondary | ICD-10-CM | POA: Insufficient documentation

## 2015-05-09 DIAGNOSIS — Z720 Tobacco use: Secondary | ICD-10-CM | POA: Diagnosis not present

## 2015-05-09 DIAGNOSIS — Z8781 Personal history of (healed) traumatic fracture: Secondary | ICD-10-CM | POA: Insufficient documentation

## 2015-05-09 DIAGNOSIS — I10 Essential (primary) hypertension: Secondary | ICD-10-CM | POA: Diagnosis not present

## 2015-05-09 DIAGNOSIS — K219 Gastro-esophageal reflux disease without esophagitis: Secondary | ICD-10-CM | POA: Insufficient documentation

## 2015-05-09 MED ORDER — HYDROCODONE-ACETAMINOPHEN 5-325 MG PO TABS
1.0000 | ORAL_TABLET | Freq: Once | ORAL | Status: AC
Start: 2015-05-09 — End: 2015-05-09
  Administered 2015-05-09: 1 via ORAL
  Filled 2015-05-09: qty 1

## 2015-05-09 MED ORDER — MELOXICAM 7.5 MG PO TABS: 7.5000 mg | ORAL_TABLET | Freq: Every day | ORAL | Status: DC

## 2015-05-09 NOTE — Discharge Instructions (Signed)
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
## 2015-05-09 NOTE — ED Notes (Signed)
Reports left wrist pain that started on Monday and has gotten worse today. Denies any injury

## 2015-05-25 ENCOUNTER — Telehealth: Payer: Self-pay | Admitting: Hematology

## 2015-05-25 ENCOUNTER — Encounter: Payer: Self-pay | Admitting: Hematology

## 2015-05-25 ENCOUNTER — Other Ambulatory Visit

## 2015-05-25 ENCOUNTER — Encounter: Admitting: Hematology

## 2015-05-25 NOTE — Telephone Encounter (Signed)
per pof to sch pt appt-gave pt copy of avs °

## 2015-05-25 NOTE — Progress Notes (Signed)
No show  This encounter was created in error - please disregard.

## 2015-05-25 NOTE — Telephone Encounter (Signed)
Cld son Lacrene and left message to call & r/s missed appt for pt

## 2015-07-19 ENCOUNTER — Inpatient Hospital Stay (HOSPITAL_COMMUNITY)
Admission: EM | Admit: 2015-07-19 | Discharge: 2015-07-21 | DRG: 304 | Disposition: A | Discharge status: Home / Self Care | Attending: Internal Medicine | Admitting: Internal Medicine

## 2015-07-19 DIAGNOSIS — Z923 Personal history of irradiation: Secondary | ICD-10-CM | POA: Diagnosis not present

## 2015-07-19 DIAGNOSIS — I10 Essential (primary) hypertension: Secondary | ICD-10-CM | POA: Diagnosis present

## 2015-07-19 DIAGNOSIS — J449 Chronic obstructive pulmonary disease, unspecified: Secondary | ICD-10-CM | POA: Insufficient documentation

## 2015-07-19 DIAGNOSIS — E785 Hyperlipidemia, unspecified: Secondary | ICD-10-CM | POA: Diagnosis present

## 2015-07-19 DIAGNOSIS — E876 Hypokalemia: Secondary | ICD-10-CM | POA: Diagnosis present

## 2015-07-19 DIAGNOSIS — Z9114 Patient's other noncompliance with medication regimen: Secondary | ICD-10-CM

## 2015-07-19 DIAGNOSIS — F419 Anxiety disorder, unspecified: Secondary | ICD-10-CM | POA: Diagnosis present

## 2015-07-19 DIAGNOSIS — I169 Hypertensive crisis, unspecified: Principal | ICD-10-CM | POA: Diagnosis present

## 2015-07-19 DIAGNOSIS — K219 Gastro-esophageal reflux disease without esophagitis: Secondary | ICD-10-CM | POA: Diagnosis present

## 2015-07-19 DIAGNOSIS — J810 Acute pulmonary edema: Secondary | ICD-10-CM | POA: Diagnosis present

## 2015-07-19 DIAGNOSIS — F32A Depression, unspecified: Secondary | ICD-10-CM | POA: Diagnosis present

## 2015-07-19 DIAGNOSIS — F329 Major depressive disorder, single episode, unspecified: Secondary | ICD-10-CM | POA: Diagnosis present

## 2015-07-19 DIAGNOSIS — F1410 Cocaine abuse, uncomplicated: Secondary | ICD-10-CM | POA: Diagnosis present

## 2015-07-19 DIAGNOSIS — Z79899 Other long term (current) drug therapy: Secondary | ICD-10-CM | POA: Diagnosis not present

## 2015-07-19 DIAGNOSIS — F17210 Nicotine dependence, cigarettes, uncomplicated: Secondary | ICD-10-CM | POA: Diagnosis present

## 2015-07-19 DIAGNOSIS — Z8551 Personal history of malignant neoplasm of bladder: Secondary | ICD-10-CM

## 2015-07-19 DIAGNOSIS — I160 Hypertensive urgency: Secondary | ICD-10-CM

## 2015-07-19 DIAGNOSIS — M109 Gout, unspecified: Secondary | ICD-10-CM | POA: Diagnosis not present

## 2015-07-19 DIAGNOSIS — C50412 Malignant neoplasm of upper-outer quadrant of left female breast: Secondary | ICD-10-CM | POA: Diagnosis present

## 2015-07-19 DIAGNOSIS — R0602 Shortness of breath: Secondary | ICD-10-CM | POA: Diagnosis present

## 2015-07-19 DIAGNOSIS — M1990 Unspecified osteoarthritis, unspecified site: Secondary | ICD-10-CM | POA: Diagnosis not present

## 2015-07-19 DIAGNOSIS — I5021 Acute systolic (congestive) heart failure: Secondary | ICD-10-CM

## 2015-07-19 DIAGNOSIS — J441 Chronic obstructive pulmonary disease with (acute) exacerbation: Secondary | ICD-10-CM | POA: Diagnosis present

## 2015-07-19 DIAGNOSIS — Z853 Personal history of malignant neoplasm of breast: Secondary | ICD-10-CM | POA: Diagnosis present

## 2015-07-19 DIAGNOSIS — Z720 Tobacco use: Secondary | ICD-10-CM | POA: Diagnosis present

## 2015-07-20 ENCOUNTER — Emergency Department (HOSPITAL_COMMUNITY)

## 2015-07-20 ENCOUNTER — Encounter (HOSPITAL_COMMUNITY): Payer: Self-pay | Admitting: Emergency Medicine

## 2015-07-20 ENCOUNTER — Inpatient Hospital Stay (HOSPITAL_COMMUNITY)

## 2015-07-20 DIAGNOSIS — F419 Anxiety disorder, unspecified: Secondary | ICD-10-CM | POA: Diagnosis present

## 2015-07-20 DIAGNOSIS — R0602 Shortness of breath: Secondary | ICD-10-CM | POA: Diagnosis present

## 2015-07-20 DIAGNOSIS — C50412 Malignant neoplasm of upper-outer quadrant of left female breast: Secondary | ICD-10-CM | POA: Diagnosis present

## 2015-07-20 DIAGNOSIS — F329 Major depressive disorder, single episode, unspecified: Secondary | ICD-10-CM | POA: Diagnosis present

## 2015-07-20 DIAGNOSIS — F1410 Cocaine abuse, uncomplicated: Secondary | ICD-10-CM | POA: Diagnosis present

## 2015-07-20 DIAGNOSIS — I169 Hypertensive crisis, unspecified: Secondary | ICD-10-CM | POA: Diagnosis present

## 2015-07-20 DIAGNOSIS — J810 Acute pulmonary edema: Secondary | ICD-10-CM | POA: Diagnosis present

## 2015-07-20 DIAGNOSIS — E876 Hypokalemia: Secondary | ICD-10-CM | POA: Diagnosis present

## 2015-07-20 DIAGNOSIS — J449 Chronic obstructive pulmonary disease, unspecified: Secondary | ICD-10-CM | POA: Diagnosis present

## 2015-07-20 DIAGNOSIS — I160 Hypertensive urgency: Secondary | ICD-10-CM | POA: Diagnosis not present

## 2015-07-20 DIAGNOSIS — I10 Essential (primary) hypertension: Secondary | ICD-10-CM | POA: Diagnosis present

## 2015-07-20 DIAGNOSIS — M1990 Unspecified osteoarthritis, unspecified site: Secondary | ICD-10-CM | POA: Diagnosis present

## 2015-07-20 DIAGNOSIS — M109 Gout, unspecified: Secondary | ICD-10-CM | POA: Diagnosis present

## 2015-07-20 DIAGNOSIS — Z9114 Patient's other noncompliance with medication regimen: Secondary | ICD-10-CM | POA: Diagnosis not present

## 2015-07-20 DIAGNOSIS — Z79899 Other long term (current) drug therapy: Secondary | ICD-10-CM | POA: Diagnosis not present

## 2015-07-20 DIAGNOSIS — F32A Depression, unspecified: Secondary | ICD-10-CM | POA: Diagnosis present

## 2015-07-20 DIAGNOSIS — Z923 Personal history of irradiation: Secondary | ICD-10-CM | POA: Diagnosis not present

## 2015-07-20 DIAGNOSIS — K219 Gastro-esophageal reflux disease without esophagitis: Secondary | ICD-10-CM | POA: Diagnosis present

## 2015-07-20 DIAGNOSIS — J441 Chronic obstructive pulmonary disease with (acute) exacerbation: Secondary | ICD-10-CM | POA: Diagnosis present

## 2015-07-20 DIAGNOSIS — Z8551 Personal history of malignant neoplasm of bladder: Secondary | ICD-10-CM | POA: Diagnosis not present

## 2015-07-20 DIAGNOSIS — Z720 Tobacco use: Secondary | ICD-10-CM | POA: Diagnosis present

## 2015-07-20 DIAGNOSIS — F17210 Nicotine dependence, cigarettes, uncomplicated: Secondary | ICD-10-CM | POA: Diagnosis present

## 2015-07-20 DIAGNOSIS — E785 Hyperlipidemia, unspecified: Secondary | ICD-10-CM | POA: Diagnosis present

## 2015-07-20 LAB — BRAIN NATRIURETIC PEPTIDE: B Natriuretic Peptide: 1213.6 pg/mL — ABNORMAL HIGH (ref 0.0–100.0)

## 2015-07-20 LAB — BASIC METABOLIC PANEL
Anion gap: 12 (ref 5–15)
BUN: 11 mg/dL (ref 6–20)
CALCIUM: 9.4 mg/dL (ref 8.9–10.3)
CO2: 24 mmol/L (ref 22–32)
Chloride: 103 mmol/L (ref 101–111)
Creatinine, Ser: 0.83 mg/dL (ref 0.44–1.00)
GFR calc Af Amer: >60 mL/min (ref >60)
GLUCOSE: 127 mg/dL — AB (ref 65–99)
Potassium: 2.8 mmol/L — ABNORMAL LOW (ref 3.5–5.1)
Sodium: 139 mmol/L (ref 135–145)

## 2015-07-20 LAB — URINALYSIS, ROUTINE W REFLEX MICROSCOPIC
Bilirubin Urine: NEGATIVE
GLUCOSE, UA: NEGATIVE mg/dL
Ketones, ur: NEGATIVE mg/dL
LEUKOCYTES UA: NEGATIVE
Nitrite: NEGATIVE
PH: 7 (ref 5.0–8.0)
Protein, ur: >300 mg/dL — AB
Specific Gravity, Urine: 1.016 (ref 1.005–1.030)

## 2015-07-20 LAB — I-STAT CHEM 8, ED
BUN: 13 mg/dL (ref 6–20)
Calcium, Ion: 1.14 mmol/L (ref 1.13–1.30)
Chloride: 107 mmol/L (ref 101–111)
Creatinine, Ser: 0.8 mg/dL (ref 0.44–1.00)
Glucose, Bld: 103 mg/dL — ABNORMAL HIGH (ref 65–99)
HEMATOCRIT: 48 % — AB (ref 36.0–46.0)
Hemoglobin: 16.3 g/dL — ABNORMAL HIGH (ref 12.0–15.0)
Potassium: 3.8 mmol/L (ref 3.5–5.1)
Sodium: 142 mmol/L (ref 135–145)
TCO2: 25 mmol/L (ref 0–100)

## 2015-07-20 LAB — CBC WITH DIFFERENTIAL/PLATELET
Basophils Absolute: 0 10*3/uL (ref 0.0–0.1)
Basophils Relative: 0 %
Eosinophils Absolute: 0.1 10*3/uL (ref 0.0–0.7)
Eosinophils Relative: 2 %
HEMATOCRIT: 44.4 % (ref 36.0–46.0)
Hemoglobin: 14.6 g/dL (ref 12.0–15.0)
LYMPHS PCT: 19 %
Lymphs Abs: 1.5 10*3/uL (ref 0.7–4.0)
MCH: 31.1 pg (ref 26.0–34.0)
MCHC: 32.9 g/dL (ref 30.0–36.0)
MCV: 94.7 fL (ref 78.0–100.0)
MONO ABS: 0.8 10*3/uL (ref 0.1–1.0)
MONOS PCT: 10 %
NEUTROS ABS: 5.2 10*3/uL (ref 1.7–7.7)
NEUTROS PCT: 69 %
Platelets: 318 10*3/uL (ref 150–400)
RBC: 4.69 MIL/uL (ref 3.87–5.11)
RDW: 13.2 % (ref 11.5–15.5)
WBC: 7.5 10*3/uL (ref 4.0–10.5)

## 2015-07-20 LAB — CBC
HEMATOCRIT: 45.8 % (ref 36.0–46.0)
HEMOGLOBIN: 14.9 g/dL (ref 12.0–15.0)
MCH: 30.6 pg (ref 26.0–34.0)
MCHC: 32.5 g/dL (ref 30.0–36.0)
MCV: 94 fL (ref 78.0–100.0)
Platelets: 315 10*3/uL (ref 150–400)
RBC: 4.87 MIL/uL (ref 3.87–5.11)
RDW: 13.1 % (ref 11.5–15.5)
WBC: 5.9 10*3/uL (ref 4.0–10.5)

## 2015-07-20 LAB — URINE MICROSCOPIC-ADD ON

## 2015-07-20 LAB — LIPID PANEL
CHOL/HDL RATIO: 4.1 ratio
Cholesterol: 280 mg/dL — ABNORMAL HIGH (ref 0–200)
HDL: 69 mg/dL (ref >40)
LDL CALC: 187 mg/dL — AB (ref 0–99)
TRIGLYCERIDES: 119 mg/dL (ref <150)
VLDL: 24 mg/dL (ref 0–40)

## 2015-07-20 LAB — RAPID URINE DRUG SCREEN, HOSP PERFORMED
Amphetamines: NOT DETECTED
Barbiturates: NOT DETECTED
Benzodiazepines: NOT DETECTED
COCAINE: POSITIVE — AB
OPIATES: NOT DETECTED
TETRAHYDROCANNABINOL: POSITIVE — AB

## 2015-07-20 LAB — CBG MONITORING, ED: GLUCOSE-CAPILLARY: 127 mg/dL — AB (ref 65–99)

## 2015-07-20 LAB — APTT: APTT: 34 s (ref 24–37)

## 2015-07-20 LAB — PROTIME-INR
INR: 1.01 (ref 0.00–1.49)
Prothrombin Time: 13.5 seconds (ref 11.6–15.2)

## 2015-07-20 LAB — MAGNESIUM: MAGNESIUM: 1.4 mg/dL — AB (ref 1.7–2.4)

## 2015-07-20 LAB — I-STAT TROPONIN, ED: TROPONIN I, POC: 0.03 ng/mL (ref 0.00–0.08)

## 2015-07-20 LAB — TROPONIN I
TROPONIN I: 0.03 ng/mL (ref <0.031)
TROPONIN I: 0.03 ng/mL (ref <0.031)

## 2015-07-20 LAB — HIV ANTIBODY (ROUTINE TESTING W REFLEX): HIV Screen 4th Generation wRfx: NONREACTIVE

## 2015-07-20 MED ORDER — NICOTINE 21 MG/24HR TD PT24
21.0000 mg | MEDICATED_PATCH | Freq: Every day | TRANSDERMAL | Status: DC
  Administered 2015-07-20 – 2015-07-21 (×2): 21 mg via TRANSDERMAL
  Filled 2015-07-20 (×2): qty 1

## 2015-07-20 MED ORDER — GABAPENTIN 300 MG PO CAPS
300.0000 mg | ORAL_CAPSULE | Freq: Two times a day (BID) | ORAL | Status: DC
  Administered 2015-07-20 – 2015-07-21 (×3): 300 mg via ORAL
  Filled 2015-07-20 (×4): qty 1

## 2015-07-20 MED ORDER — ACETAMINOPHEN 325 MG PO TABS
650.0000 mg | ORAL_TABLET | Freq: Four times a day (QID) | ORAL | Status: DC | PRN
  Administered 2015-07-20: 650 mg via ORAL
  Filled 2015-07-20: qty 2

## 2015-07-20 MED ORDER — MORPHINE SULFATE (PF) 2 MG/ML IV SOLN: 1.0000 mg | INTRAVENOUS | Status: DC | PRN

## 2015-07-20 MED ORDER — ASPIRIN EC 81 MG PO TBEC
81.0000 mg | DELAYED_RELEASE_TABLET | Freq: Every day | ORAL | Status: DC
  Administered 2015-07-20 – 2015-07-21 (×2): 81 mg via ORAL
  Filled 2015-07-20 (×2): qty 1

## 2015-07-20 MED ORDER — HEPARIN SODIUM (PORCINE) 5000 UNIT/ML IJ SOLN
5000.0000 [IU] | Freq: Three times a day (TID) | INTRAMUSCULAR | Status: DC
  Administered 2015-07-20 – 2015-07-21 (×4): 5000 [IU] via SUBCUTANEOUS
  Filled 2015-07-20 (×7): qty 1

## 2015-07-20 MED ORDER — POTASSIUM CHLORIDE CRYS ER 20 MEQ PO TBCR
40.0000 meq | EXTENDED_RELEASE_TABLET | ORAL | Status: AC
  Administered 2015-07-20 (×2): 40 meq via ORAL
  Filled 2015-07-20 (×2): qty 2

## 2015-07-20 MED ORDER — FUROSEMIDE 10 MG/ML IJ SOLN
40.0000 mg | Freq: Two times a day (BID) | INTRAMUSCULAR | Status: DC
  Administered 2015-07-20 – 2015-07-21 (×2): 40 mg via INTRAVENOUS
  Filled 2015-07-20 (×5): qty 4

## 2015-07-20 MED ORDER — HYDRALAZINE HCL 20 MG/ML IJ SOLN
5.0000 mg | Freq: Once | INTRAMUSCULAR | Status: AC
Start: 2015-07-20 — End: 2015-07-20
  Administered 2015-07-20: 5 mg via INTRAVENOUS
  Filled 2015-07-20: qty 1

## 2015-07-20 MED ORDER — ONDANSETRON HCL 4 MG PO TABS: 4.0000 mg | ORAL_TABLET | Freq: Four times a day (QID) | ORAL | Status: DC | PRN

## 2015-07-20 MED ORDER — MAGNESIUM SULFATE 2 GM/50ML IV SOLN
2.0000 g | Freq: Once | INTRAVENOUS | Status: AC
  Administered 2015-07-20: 2 g via INTRAVENOUS
  Filled 2015-07-20: qty 50

## 2015-07-20 MED ORDER — HYDROCHLOROTHIAZIDE 12.5 MG PO CAPS
12.5000 mg | ORAL_CAPSULE | Freq: Every day | ORAL | Status: DC
  Administered 2015-07-20: 12.5 mg via ORAL
  Filled 2015-07-20: qty 1

## 2015-07-20 MED ORDER — GUAIFENESIN ER 600 MG PO TB12
600.0000 mg | ORAL_TABLET | Freq: Two times a day (BID) | ORAL | Status: DC
  Administered 2015-07-20 – 2015-07-21 (×3): 600 mg via ORAL
  Filled 2015-07-20 (×4): qty 1

## 2015-07-20 MED ORDER — HYDRALAZINE HCL 20 MG/ML IJ SOLN
10.0000 mg | INTRAMUSCULAR | Status: DC | PRN
  Administered 2015-07-21: 10 mg via INTRAVENOUS
  Filled 2015-07-20: qty 1

## 2015-07-20 MED ORDER — LISINOPRIL 20 MG PO TABS
20.0000 mg | ORAL_TABLET | Freq: Every day | ORAL | Status: DC
  Administered 2015-07-20: 20 mg via ORAL
  Filled 2015-07-20: qty 1

## 2015-07-20 MED ORDER — BUPROPION HCL 75 MG PO TABS
75.0000 mg | ORAL_TABLET | Freq: Two times a day (BID) | ORAL | Status: DC
  Administered 2015-07-20 – 2015-07-21 (×3): 75 mg via ORAL
  Filled 2015-07-20 (×4): qty 1

## 2015-07-20 MED ORDER — HYDRALAZINE HCL 20 MG/ML IJ SOLN
10.0000 mg | Freq: Once | INTRAMUSCULAR | Status: AC
  Administered 2015-07-20: 10 mg via INTRAVENOUS
  Filled 2015-07-20: qty 1

## 2015-07-20 MED ORDER — ACETAMINOPHEN 650 MG RE SUPP: 650.0000 mg | Freq: Four times a day (QID) | RECTAL | Status: DC | PRN

## 2015-07-20 MED ORDER — LISINOPRIL 40 MG PO TABS
40.0000 mg | ORAL_TABLET | Freq: Every day | ORAL | Status: DC
  Administered 2015-07-21: 40 mg via ORAL
  Filled 2015-07-20: qty 1

## 2015-07-20 MED ORDER — AMLODIPINE BESYLATE 10 MG PO TABS
10.0000 mg | ORAL_TABLET | Freq: Every morning | ORAL | Status: DC
  Administered 2015-07-20 – 2015-07-21 (×2): 10 mg via ORAL
  Filled 2015-07-20 (×2): qty 1

## 2015-07-20 MED ORDER — LISINOPRIL-HYDROCHLOROTHIAZIDE 20-12.5 MG PO TABS: 1.0000 | ORAL_TABLET | Freq: Every day | ORAL | Status: DC

## 2015-07-20 MED ORDER — ALBUTEROL SULFATE (2.5 MG/3ML) 0.083% IN NEBU: 2.5000 mg | INHALATION_SOLUTION | RESPIRATORY_TRACT | Status: DC | PRN

## 2015-07-20 MED ORDER — LISINOPRIL 20 MG PO TABS
20.0000 mg | ORAL_TABLET | Freq: Once | ORAL | Status: AC
Start: 2015-07-20 — End: 2015-07-20
  Administered 2015-07-20: 20 mg via ORAL
  Filled 2015-07-20: qty 1

## 2015-07-20 MED ORDER — ONDANSETRON HCL 4 MG/2ML IJ SOLN: 4.0000 mg | Freq: Four times a day (QID) | INTRAMUSCULAR | Status: DC | PRN

## 2015-07-20 MED ORDER — AMLODIPINE BESYLATE 10 MG PO TABS: 10.0000 mg | ORAL_TABLET | Freq: Every day | ORAL | Status: DC

## 2015-07-20 MED ORDER — LORAZEPAM 2 MG/ML IJ SOLN
1.0000 mg | Freq: Once | INTRAMUSCULAR | Status: AC
  Administered 2015-07-20: 1 mg via INTRAVENOUS
  Filled 2015-07-20: qty 1

## 2015-07-20 MED ORDER — ANASTROZOLE 1 MG PO TABS
1.0000 mg | ORAL_TABLET | Freq: Every day | ORAL | Status: DC
  Administered 2015-07-20 – 2015-07-21 (×2): 1 mg via ORAL
  Filled 2015-07-20 (×2): qty 1

## 2015-07-20 MED ORDER — SODIUM CHLORIDE 0.9 % IJ SOLN
3.0000 mL | Freq: Two times a day (BID) | INTRAMUSCULAR | Status: DC
  Administered 2015-07-20 – 2015-07-21 (×2): 3 mL via INTRAVENOUS

## 2015-07-20 MED ORDER — FUROSEMIDE 10 MG/ML IJ SOLN
40.0000 mg | Freq: Once | INTRAMUSCULAR | Status: AC
  Administered 2015-07-20: 40 mg via INTRAVENOUS
  Filled 2015-07-20: qty 4

## 2015-07-20 MED ORDER — NITROGLYCERIN IN D5W 200-5 MCG/ML-% IV SOLN
2.0000 ug/min | INTRAVENOUS | Status: DC
  Administered 2015-07-20: 60 ug/min via INTRAVENOUS
  Administered 2015-07-20: 50 ug/min via INTRAVENOUS
  Administered 2015-07-20: 35 ug/min via INTRAVENOUS
  Administered 2015-07-20: 45 ug/min via INTRAVENOUS
  Administered 2015-07-20: 10 ug/min via INTRAVENOUS
  Administered 2015-07-20: 40 ug/min via INTRAVENOUS
  Administered 2015-07-20: 20 ug/min via INTRAVENOUS
  Administered 2015-07-20: 15 ug/min via INTRAVENOUS
  Administered 2015-07-20: 30 ug/min via INTRAVENOUS
  Filled 2015-07-20: qty 250

## 2015-07-20 MED ORDER — LORAZEPAM 2 MG/ML IJ SOLN
0.5000 mg | INTRAMUSCULAR | Status: DC | PRN
  Administered 2015-07-21: 0.5 mg via INTRAVENOUS
  Filled 2015-07-20: qty 1

## 2015-07-20 NOTE — ED Notes (Signed)
Pt states she has been having periods of shortness of breath all day that got worse tonight when she laid down  Pt states she has hx of panic attacks  Pt denies any pain at all
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
## 2015-07-20 NOTE — ED Notes (Signed)
Echocardiogram in progress at bedside.

## 2015-07-20 NOTE — Progress Notes (Signed)
Echocardiogram 2D Echocardiogram has been performed.  Tresa Res 07/20/2015, 1:33 PM

## 2015-07-20 NOTE — ED Notes (Signed)
Team 4, Dr. Clementeen Graham has been paged to see if the pt can be downgraded to a tele bed.

## 2015-07-20 NOTE — Progress Notes (Signed)
[facility] PROGRESS NOTE  Victoria Burke [id number] DOB: 1950-07-26 DOA: 07/19/2015 PCP: Benito Mccreedy, MD  Brief narrative 65 year old female with history of hypertension, ongoing tobacco use, COPD, GERD, history of breast cancer, bladder cancer, gout, depression, gastric ulcer and noncompliant to medications presented with three-day history of progressive shortness of breath associated with some cough and clear mucus. She reports ongoing cocaine use. In the ED she was found to have hypertensive urgency with blood pressure 220/170mmhg. urine drug screen was positive for cocaine and marijuana. BNP was elevated to 1200. Chest x-ray consistent with acute pulmonary edema. EKG unremarkable. Patient was initially given IV hydralazine without improvement and started on nitroglycerin drip.   Assessment/Plan: Hypertensive crisis with acute pulmonary edema Secondary to medication noncompliance and ongoing cocaine use. Started on nitroglycerin drip and plan to admit to stepdown unit. Blood pressure improving and will titrate her drip discontinued. Resumed amlodipine and increase dose of lisinopril. On Lasix 40 mg IV twice a day. Monitor I/O. Added IV hydralazine when necessary. -Denies any chest pain symptoms. -2-D echo ordered. Added aspirin.  Hypokalemia/hypomagnesemia Replenished  History of left breast cancer On anastrozole  COPD Stable. Continue nebs as needed. Counseled strongly on cessation.  Anxiety and depression Not on medications. Stable.  Polysubstance abuse (alcohol, tobacco use, marijuana and cocaine) Counseled on cessation. Nicotine patch ordered   Diet: Heart healthy DVT prophylaxis   Code Status: full code Family Communication: none at bedside Disposition Plan: Admit to telemetry once off nitroglycerin drip   Consultants:  None  Procedures:  2-D echo ordered  Antibiotics:  None  HPI/Subjective: Seen and examined. Denies chest pain or  shortness of breath. Admission H&P reviewed  Objective: Filed Vitals:   07/20/15 1000 07/20/15 1015  BP: 148/87 157/92  Pulse: 36 82  Temp:    Resp: 24 26   No intake or output data in the 24 hours ending 07/20/15 1138 Filed Weights   07/19/15 2350  Weight: 56.7 kg (125 lb)    Exam:   General:  Middle aged female not in distress  HEENT: No pallor, moist mucosa  Chest: Clear bilaterally   CVS: Normal S1 and S2, no murmurs  GI: Soft, nondistended, nontender  Musculoskeletal: Warm, no edema   Data Reviewed: Basic Metabolic Panel:  Recent Labs Lab 07/20/15 0054 07/20/15 0436  NA 142 139  K 3.8 2.8*  CL 107 103  CO2  --  24  GLUCOSE 103* 127*  BUN 13 11  CREATININE 0.80 0.83  CALCIUM  --  9.4  MG  --  1.4*   Liver Function Tests: No results for input(s): AST, ALT, ALKPHOS, BILITOT, PROT, ALBUMIN in the last 168 hours. No results for input(s): LIPASE, AMYLASE in the last 168 hours. No results for input(s): AMMONIA in the last 168 hours. CBC:  Recent Labs Lab 07/20/15 0045 07/20/15 0054 07/20/15 0436  WBC 7.5  --  5.9  NEUTROABS 5.2  --   --   HGB 14.6 16.3* 14.9  HCT 44.4 48.0* 45.8  MCV 94.7  --  94.0  PLT 318  --  315   Cardiac Enzymes:  Recent Labs Lab 07/20/15 0436 07/20/15 0844  TROPONINI 0.03 0.03   BNP (last 3 results)  Recent Labs  07/20/15 0436  BNP 1213.6*    ProBNP (last 3 results) No results for input(s): PROBNP in the last 8760 hours.  CBG:  Recent Labs Lab 07/20/15 0822  GLUCAP 127*    No results found for this or any  previous visit (from the past 240 hour(s)).   Studies: Dg Chest 2 View  07/20/2015  CLINICAL DATA:  Progressive shortness of breath.  Hypertension. EXAM: CHEST  2 VIEW COMPARISON:  12/22/2012 FINDINGS: Mild cardiomegaly is unchanged. Diffuse reticular opacities most consistent with pulmonary edema, new from prior exam. Small left pleural effusion. No confluent airspace disease. No pneumothorax. No  acute osseous abnormalities. Diffuse atherosclerosis of the aorta. IMPRESSION: Pulmonary edema with cardiomegaly and small left pleural effusion. Findings most consistent with CHF. Electronically Signed   By: Jeb Levering M.D.   On: 07/20/2015 01:17    Scheduled Meds: . amLODipine  10 mg Oral q morning - 10a  . anastrozole  1 mg Oral Daily  . aspirin EC  81 mg Oral Daily  . buPROPion  75 mg Oral BID  . furosemide  40 mg Intravenous Q12H  . gabapentin  300 mg Oral BID  . guaiFENesin  600 mg Oral BID  . heparin  5,000 Units Subcutaneous 3 times per day  . [START ON 07/21/2015] lisinopril  40 mg Oral Daily  . nicotine  21 mg Transdermal Daily  . potassium chloride  40 mEq Oral 6 times per day  . sodium chloride  3 mL Intravenous Q12H   Continuous Infusions: . magnesium sulfate 1 - 4 g bolus IVPB 2 g (07/20/15 1047)  . nitroGLYCERIN Stopped (07/20/15 1047)      Time spent: 20 minutes    Angelina Venard, [facility] Pager [phone] If 7PM-7AM, please contact night-coverage at [email or web address], password [email or web address] 07/20/2015, 11:38 AM  LOS: 0 days
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
## 2015-07-20 NOTE — ED Notes (Signed)
Pt jerked away during blood draw and refused lab draw from this RN

## 2015-07-21 DIAGNOSIS — Z720 Tobacco use: Secondary | ICD-10-CM

## 2015-07-21 DIAGNOSIS — J810 Acute pulmonary edema: Secondary | ICD-10-CM

## 2015-07-21 DIAGNOSIS — I160 Hypertensive urgency: Secondary | ICD-10-CM

## 2015-07-21 DIAGNOSIS — I169 Hypertensive crisis, unspecified: Principal | ICD-10-CM

## 2015-07-21 DIAGNOSIS — F1410 Cocaine abuse, uncomplicated: Secondary | ICD-10-CM

## 2015-07-21 DIAGNOSIS — C50412 Malignant neoplasm of upper-outer quadrant of left female breast: Secondary | ICD-10-CM

## 2015-07-21 DIAGNOSIS — E876 Hypokalemia: Secondary | ICD-10-CM

## 2015-07-21 DIAGNOSIS — J449 Chronic obstructive pulmonary disease, unspecified: Secondary | ICD-10-CM | POA: Insufficient documentation

## 2015-07-21 LAB — BASIC METABOLIC PANEL
Anion gap: 10 (ref 5–15)
BUN: 15 mg/dL (ref 6–20)
CO2: 23 mmol/L (ref 22–32)
Calcium: 8.9 mg/dL (ref 8.9–10.3)
Chloride: 107 mmol/L (ref 101–111)
Creatinine, Ser: 1.1 mg/dL — ABNORMAL HIGH (ref 0.44–1.00)
GFR calc Af Amer: >60 mL/min (ref >60)
GFR, EST NON AFRICAN AMERICAN: 52 mL/min — AB (ref >60)
GLUCOSE: 131 mg/dL — AB (ref 65–99)
POTASSIUM: 3.5 mmol/L (ref 3.5–5.1)
Sodium: 140 mmol/L (ref 135–145)

## 2015-07-21 LAB — MAGNESIUM: MAGNESIUM: 1.7 mg/dL (ref 1.7–2.4)

## 2015-07-21 LAB — HEMOGLOBIN A1C
Hgb A1c MFr Bld: 6.2 % — ABNORMAL HIGH (ref 4.8–5.6)
Mean Plasma Glucose: 131 mg/dL

## 2015-07-21 LAB — GLUCOSE, CAPILLARY: Glucose-Capillary: 115 mg/dL — ABNORMAL HIGH (ref 65–99)

## 2015-07-21 MED ORDER — ALBUTEROL SULFATE HFA 108 (90 BASE) MCG/ACT IN AERS: 1.0000 | INHALATION_SPRAY | Freq: Four times a day (QID) | RESPIRATORY_TRACT | Status: DC | PRN

## 2015-07-21 MED ORDER — AMLODIPINE BESYLATE 10 MG PO TABS: 10.0000 mg | ORAL_TABLET | Freq: Every morning | ORAL | Status: DC

## 2015-07-21 MED ORDER — NICOTINE 21 MG/24HR TD PT24: 21.0000 mg | MEDICATED_PATCH | Freq: Every day | TRANSDERMAL | Status: DC

## 2015-07-21 MED ORDER — LISINOPRIL-HYDROCHLOROTHIAZIDE 20-12.5 MG PO TABS: 2.0000 | ORAL_TABLET | Freq: Every day | ORAL | Status: DC

## 2015-07-21 NOTE — Evaluation (Signed)
Occupational Therapy Evaluation Patient Details Name: Victoria Burke MRN: [id number] DOB: Nov 20, 1950 Today's Date: 07/21/2015    History of Present Illness pt was admitted for hypertensive urgency and acute pulmonary edema.  Pt has a PMH significant for COPD, substance abuse, non-compliance with meds, and breast and bladder CA   Clinical Impression   This 65 year old female was admitted for the above. All education was completed. No further OT is needed at this time    Follow Up Recommendations  No OT follow up    Equipment Recommendations  None recommended by OT    Recommendations for Other Services       Precautions / Restrictions Precautions Precautions: None Restrictions Weight Bearing Restrictions: No      Mobility Bed Mobility               General bed mobility comments: oob  Transfers Overall transfer level: Independent                    Balance                                            ADL Overall ADL's : Needs assistance/impaired                                 Tub/ Shower Transfer: Min guard;Tub transfer     General ADL Comments: ambulated in room at supervision level to retrieve items.  Pt only a little unsteady when stepping over simulated tub.  She states that she has had to break up activities and take rest breaks in the past.  She has an aide several times a week, but aide doesn't assist with IADLs and she needs this.  Aide typically has not assisted with bathing. Encouraged her to shower when aide there an available to guard her stepping in and out of tub.  Also, she might want to consider a seat for energy conservation     Vision     Perception     Praxis      Pertinent Vitals/Pain Pain Assessment: No/denies pain     Hand Dominance     Extremity/Trunk Assessment Upper Extremity Assessment Upper Extremity Assessment: Generalized weakness           Communication  Communication Communication: No difficulties   Cognition Arousal/Alertness: Awake/alert Behavior During Therapy: WFL for tasks assessed/performed Overall Cognitive Status: Within Functional Limits for tasks assessed                     General Comments       Exercises       Shoulder Instructions      Home Living Family/patient expects to be discharged to:: Private residence Living Arrangements: Alone   Type of Home: House Home Access: Stairs to enter Technical brewer of Steps: 3-4   Home Layout: One level     Bathroom Shower/Tub: Teacher, early years/pre: Standard     Home Equipment: None   Additional Comments: pt feels she needs cane d/t her L knee "giving out"      Prior Functioning/Environment Level of Independence: Independent             OT Diagnosis: Generalized weakness   OT Problem List:     OT Treatment/Interventions:  OT Goals(Current goals can be found in the care plan section) Acute Rehab OT Goals Patient Stated Goal: none stated OT Goal Formulation: All assessment and education complete, DC therapy  OT Frequency:     Barriers to D/C:            Co-evaluation              End of Session    Activity Tolerance: Patient tolerated treatment well Patient left: in chair;with call bell/phone within reach   Time: 1023-1036 OT Time Calculation (min): 13 min Charges:  OT General Charges $OT Visit: 1 Procedure OT Evaluation $OT Eval Low Complexity: 1 Procedure G-Codes:    Rodolph Hagemann 2015/07/22, 11:49 AM Lesle Chris, OTR/L [phone] 07/22/15

## 2015-07-21 NOTE — Progress Notes (Signed)
Patient notified of discharge.  Educated on medications, follow-up appointment at [facility], and discharge instructions.  Patient stated understanding and AVS signed.  IV removed.  Telemetry removed and CCMD notified.  Patient gathered belongings.  Prescriptions handed to patient.  She is calling for a ride.

## 2015-07-21 NOTE — Care Management Note (Signed)
Case Management Note  Patient Details  Name: Victoria Burke MRN: [id number] Date of Birth: 1951-06-19  Subjective/Objective:      65 yo admitted with Hypertensive urgency              Action/Plan: From home alone  Expected Discharge Date:   (UNKNOWN)               Expected Discharge Plan:  Home/Self Care  In-House Referral:     Discharge planning Services  CM Consult, Follow-up appt scheduled  Post Acute Care Choice:    Choice offered to:     DME Arranged:    DME Agency:     HH Arranged:    [place] Agency:     Status of Service:  Completed, signed off  Medicare Important Message Given:    Date Medicare IM Given:    Medicare IM give by:    Date Additional Medicare IM Given:    Additional Medicare Important Message give by:     If discussed at [place] of Stay Meetings, dates discussed:    Additional Comments: This CM spoke with pt about need for PCP. Pt states she does not have a PCP although in the chart it states her PCP is Dr. Emilee Hero Bonsu. Pt has Medicaid but states she does not have a Medicaid doctor. This pt was encouraged to inquire about a Medicaid card as she is supposed to see the MD that is assigned to her by Medicaid. In the meantime this CM made pt a F/U appointment for pt at the [place] (extension of [place]). Appointment placed on AVS. Avanthika Dehnert, Marjie Skiff, RN 07/21/2015, 11:14 AM

## 2015-07-21 NOTE — Evaluation (Signed)
Physical Therapy Evaluation Patient Details Name: Victoria Burke MRN: [id number] DOB: March 23, 1951 Today's Date: 07/21/2015   History of Present Illness  65 yo female  admitted for hypertensive urgency and acute pulmonary edema.  Pt has a PMH significant for COPD, substance abuse, non-compliance with meds, and breast and bladder CA  Clinical Impression  Patient evaluated by Physical Therapy with no further acute PT needs identified. All education has been completed and the patient has no further questions.   See below for any follow-up Physical Therapy or equipment needs. PT is signing off. Thank you for this referral.     Follow Up Recommendations No PT follow up    Equipment Recommendations  None recommended by PT    Recommendations for Other Services       Precautions / Restrictions Precautions Precautions: None Restrictions Weight Bearing Restrictions: No      Mobility  Bed Mobility Overal bed mobility: Independent             General bed mobility comments: oob  Transfers Overall transfer level: Independent                  Ambulation/Gait Ambulation/Gait assistance: Independent Ambulation Distance (Feet): 160 Feet Assistive device: None Gait Pattern/deviations: WFL(Within Functional Limits)   Gait velocity interpretation: at or above normal speed for age/gender General Gait Details: pt reports she needs a cane d/t her "knee and bad back"; however her gait at this time is [facility] and she denies falls at home  Stairs            Wheelchair Mobility    Modified Rankin (Stroke Patients Only)       Balance Overall balance assessment: Independent (no LOB with head turns, turns, backward amb 5')                                           Pertinent Vitals/Pain Pain Assessment: No/denies pain    Home Living Family/patient expects to be discharged to:: Private residence Living Arrangements: Alone   Type of Home: House Home  Access: Stairs to enter   Technical brewer of Steps: 3-4 Home Layout: One level Home Equipment: None Additional Comments: pt feels she needs cane d/t her L knee "giving out"    Prior Function Level of Independence: Independent               Hand Dominance        Extremity/Trunk Assessment   Upper Extremity Assessment: Defer to OT evaluation           Lower Extremity Assessment: Overall WFL for tasks assessed         Communication   Communication: No difficulties  Cognition Arousal/Alertness: Awake/alert Behavior During Therapy: WFL for tasks assessed/performed Overall Cognitive Status: Within Functional Limits for tasks assessed                      General Comments      Exercises        Assessment/Plan    PT Assessment Patent does not need any further PT services  PT Diagnosis Difficulty walking   PT Problem List    PT Treatment Interventions     PT Goals (Current goals can be found in the Care Plan section) Acute Rehab PT Goals Patient Stated Goal: none stated PT Goal Formulation: All assessment and education complete, DC therapy  Frequency     Barriers to discharge        Co-evaluation               End of Session   Activity Tolerance: Patient tolerated treatment well Patient left: in chair;with call bell/phone within reach           Time: 1022-1030 PT Time Calculation (min) (ACUTE ONLY): 8 min   Charges:   PT Evaluation $PT Eval Low Complexity: 1 Procedure     PT G Codes:        Mckennon Zwart August 01, 2015, 12:00 PM

## 2015-07-21 NOTE — Discharge Summary (Addendum)
Physician Discharge Summary  Victoria Burke [id number] DOB: [birth year] DOA: 07/19/2015  PCP: none Admit date: 07/19/2015 Discharge date: 07/21/2015  Time spent: 35 minutes  Recommendations for Outpatient Follow-up:  1. Discharge home with outpatient follow-up arranged.   Discharge Diagnoses:  Principal Problem:   Hypertensive crisis   Active Problems:   Breast cancer of upper-outer quadrant of left female breast ([place])   Hypokalemia   Pulmonary edema   COPD (chronic obstructive pulmonary disease) ([place])   Gout   Depression   Tobacco abuse   Cocaine abuse   Medication noncompliance    Discharge Condition: Fair  Diet recommendation: Low sodium  Filed Weights   07/19/15 2350  Weight: 56.7 kg (125 lb)    History of present illness:  Please refer to admission H&P for details, in brief, 65 year old female with history of hypertension, ongoing tobacco use, COPD, GERD, history of breast cancer, bladder cancer, gout, depression, gastric ulcer and noncompliant to medications presented with three-day history of progressive shortness of breath associated with some cough and clear mucus. She reports ongoing cocaine use. In the ED she was found to have hypertensive urgency with blood pressure 220/135mmhg. urine drug screen was positive for cocaine and marijuana. BNP was elevated to 1200. Chest x-ray consistent with acute pulmonary edema. EKG unremarkable. Patient was initially given IV hydralazine without improvement and started on nitroglycerin drip.  Hospital Course:  Hypertensive crisis with acute pulmonary edema Secondary to medication noncompliance and ongoing cocaine use. Started on nitroglycerin drip. Patient was on amlodipine, lisinopril and HCTZ but has been noncompliant with her medication. Resumed amlodipine and increased dose of lisinopril and HCTZ. Was also placed on IV Lasix for acute pulmonary edema. -Blood pressure well controlled and patient taken off nitroglycerin  drip. -2-D echo done showed LVH with EF of 65-70%. No wall motion abnormality. -Patient will be discharged on amlodipine, lisinopril-HCTZ. [facility]. -Counseled on diet adherence and medication compliance along with tobacco and cocaine cessation. Also counseled on alcohol cessation.  Hypokalemia/hypomagnesemia Replenished  History of left breast cancer On anastrozole  COPD Stable. Counseled strongly on smoking cessation. Prescribed albuterol inhaler as needed (was on it at home)  Anxiety and depression On  Wellbutrin. Not sure if she is taking them regularly.  Polysubstance abuse (alcohol, tobacco use, marijuana and cocaine) Counseled on cessation. Nicotine patch prescribed     Code Status: full code Family Communication: none at bedside Disposition Plan: Discharge home with outpatient follow-up   Consultants:  None  Procedures:  2-D echo ordered  Antibiotics:  None   Discharge Exam: Filed Vitals:   07/21/15 0500 07/21/15 1002  BP: 126/75 156/82  Pulse: 67 73  Temp: 98 F (36.7 C)   Resp: 22 19    General: Elderly female not in distress HEENT: No pallor, moist without Chest: Clear bilaterally, no added sounds CVS: Normal S1 and S2, no murmurs, rubs or gallop GI: Soft, nondistended, nontender, bowel sounds present Musculoskeletal: Warm, no edema CNS: Alert and oriented   Discharge Instructions    Current Discharge Medication List    START taking these medications   Details  nicotine (NICODERM CQ - DOSED IN MG/24 HOURS) 21 mg/24hr patch Place 1 patch (21 mg total) onto the skin daily. Qty: 28 patch, Refills: 0      CONTINUE these medications which have CHANGED   Details  albuterol (PROVENTIL HFA;VENTOLIN HFA) 108 (90 Base) MCG/ACT inhaler Inhale 1-2 puffs into the lungs every  6 (six) hours as needed for wheezing or shortness of breath. Qty: 3 Inhaler, Refills: 3    amLODipine (NORVASC)  10 MG tablet Take 1 tablet (10 mg total) by mouth every morning. Qty: 30 tablet, Refills: 0    lisinopril-hydrochlorothiazide (PRINZIDE,ZESTORETIC) 20-12.5 MG tablet Take 2 tablets by mouth daily. Qty: 60 tablet, Refills: 0      CONTINUE these medications which have NOT CHANGED   Details  anastrozole (ARIMIDEX) 1 MG tablet Take 1 tablet (1 mg total) by mouth daily. Qty: 90 tablet, Refills: 3    buPROPion (WELLBUTRIN) 75 MG tablet Take 75 mg by mouth 2 (two) times daily.    gabapentin (NEURONTIN) 300 MG capsule Take 300 mg by mouth 2 (two) times daily.      STOP taking these medications     HYDROcodone-acetaminophen (NORCO) 5-325 MG per tablet      meloxicam (MOBIC) 7.5 MG tablet        No Known Allergies Follow-up Information    Follow up with [facility]. Go on 08/15/2015.   Why:  1:45pm. Bring hospital DC papers, $20 copay and insurance cards   Contact information:   [facility] [phone]        The results of significant diagnostics from this hospitalization (including imaging, microbiology, ancillary and laboratory) are listed below for reference.    Significant Diagnostic Studies: Dg Chest 2 View  07/20/2015  CLINICAL DATA:  Progressive shortness of breath.  Hypertension. EXAM: CHEST  2 VIEW COMPARISON:  12/22/2012 FINDINGS: Mild cardiomegaly is unchanged. Diffuse reticular opacities most consistent with pulmonary edema, new from prior exam. Small left pleural effusion. No confluent airspace disease. No pneumothorax. No acute osseous abnormalities. Diffuse atherosclerosis of the aorta. IMPRESSION: Pulmonary edema with cardiomegaly and small left pleural effusion. Findings most consistent with CHF. Electronically Signed   By: Jeb Levering M.D.   On: 07/20/2015 01:17    Microbiology: No results found for this or any previous visit (from the past 240 hour(s)).   Labs: Basic Metabolic Panel:  Recent Labs Lab  07/20/15 0054 07/20/15 0436 07/21/15 0525  NA 142 139 140  K 3.8 2.8* 3.5  CL 107 103 107  CO2  --  24 23  GLUCOSE 103* 127* 131*  BUN 13 11 15   CREATININE 0.80 0.83 1.10*  CALCIUM  --  9.4 8.9  MG  --  1.4* 1.7   Liver Function Tests: No results for input(s): AST, ALT, ALKPHOS, BILITOT, PROT, ALBUMIN in the last 168 hours. No results for input(s): LIPASE, AMYLASE in the last 168 hours. No results for input(s): AMMONIA in the last 168 hours. CBC:  Recent Labs Lab 07/20/15 0045 07/20/15 0054 07/20/15 0436  WBC 7.5  --  5.9  NEUTROABS 5.2  --   --   HGB 14.6 16.3* 14.9  HCT 44.4 48.0* 45.8  MCV 94.7  --  94.0  PLT 318  --  315   Cardiac Enzymes:  Recent Labs Lab 07/20/15 0436 07/20/15 0844  TROPONINI 0.03 0.03   BNP: BNP (last 3 results)  Recent Labs  07/20/15 0436  BNP 1213.6*    ProBNP (last 3 results) No results for input(s): PROBNP in the last 8760 hours.  CBG:  Recent Labs Lab 07/20/15 0822 07/21/15 0748  GLUCAP 127* 115*       Signed:  Louellen Molder MD.  [facility] 07/21/2015, 11:35 AM

## 2015-07-21 NOTE — Clinical Documentation Improvement (Signed)
Internal Medicine  Can the diagnosis of Acute Congested heart failure  be further specified with the type? Thank you   Diastolic   Systolic  Other  Clinically Undetermined        Please exercise your independent, professional judgment when responding. A specific answer is not anticipated or expected.   Thank You,  [place] [phone]

## 2015-07-21 NOTE — Discharge Instructions (Signed)
DASH Eating Plan °DASH stands for "Dietary Approaches to Stop Hypertension." The DASH eating plan is a healthy eating plan that has been shown to reduce high blood pressure (hypertension). Additional health benefits may include reducing the risk of type 2 diabetes mellitus, heart disease, and stroke. The DASH eating plan may also help with weight loss. °WHAT DO I NEED TO KNOW ABOUT THE DASH EATING PLAN? °For the DASH eating plan, you will follow these general guidelines: °· Choose foods with a percent daily value for sodium of less than 5% (as listed on the food label). °· Use salt-free seasonings or herbs instead of table salt or sea salt. °· Check with your health care provider or pharmacist before using salt substitutes. °· Eat lower-sodium products, often labeled as "lower sodium" or "no salt added." °· Eat fresh foods. °· Eat more vegetables, fruits, and low-fat dairy products. °· Choose whole grains. Look for the word "whole" as the first word in the ingredient list. °· Choose fish and skinless chicken or turkey more often than red meat. Limit fish, poultry, and meat to 6 oz (170 g) each day. °· Limit sweets, desserts, sugars, and sugary drinks. °· Choose heart-healthy fats. °· Limit cheese to 1 oz (28 g) per day. °· Eat more home-cooked food and less restaurant, buffet, and fast food. °· Limit fried foods. °· Cook foods using methods other than frying. °· Limit canned vegetables. If you do use them, rinse them well to decrease the sodium. °· When eating at a restaurant, ask that your food be prepared with less salt, or no salt if possible. °WHAT FOODS CAN I EAT? °Seek help from a dietitian for individual calorie needs. °Grains °Whole grain or whole wheat bread. Brown rice. Whole grain or whole wheat pasta. Quinoa, bulgur, and whole grain cereals. Low-sodium cereals. Corn or whole wheat flour tortillas. Whole grain cornbread. Whole grain crackers. Low-sodium crackers. °Vegetables °Fresh or frozen vegetables  (raw, steamed, roasted, or grilled). Low-sodium or reduced-sodium tomato and vegetable juices. Low-sodium or reduced-sodium tomato sauce and paste. Low-sodium or reduced-sodium canned vegetables.  °Fruits °All fresh, canned (in natural juice), or frozen fruits. °Meat and Other Protein Products °Ground beef (85% or leaner), grass-fed beef, or beef trimmed of fat. Skinless chicken or turkey. Ground chicken or turkey. Pork trimmed of fat. All fish and seafood. Eggs. Dried beans, peas, or lentils. Unsalted nuts and seeds. Unsalted canned beans. °Dairy °Low-fat dairy products, such as skim or 1% milk, 2% or reduced-fat cheeses, low-fat ricotta or cottage cheese, or plain low-fat yogurt. Low-sodium or reduced-sodium cheeses. °Fats and Oils °Tub margarines without trans fats. Light or reduced-fat mayonnaise and salad dressings (reduced sodium). Avocado. Safflower, olive, or canola oils. Natural peanut or almond butter. °Other °Unsalted popcorn and pretzels. °The items listed above may not be a complete list of recommended foods or beverages. Contact your dietitian for more options. °WHAT FOODS ARE NOT RECOMMENDED? °Grains °White bread. White pasta. White rice. Refined cornbread. Bagels and croissants. Crackers that contain trans fat. °Vegetables °Creamed or fried vegetables. Vegetables in a cheese sauce. Regular canned vegetables. Regular canned tomato sauce and paste. Regular tomato and vegetable juices. °Fruits °Dried fruits. Canned fruit in light or heavy syrup. Fruit juice. °Meat and Other Protein Products °Fatty cuts of meat. Ribs, chicken wings, bacon, sausage, bologna, salami, chitterlings, fatback, hot dogs, bratwurst, and packaged luncheon meats. Salted nuts and seeds. Canned beans with salt. °Dairy °Whole or 2% milk, cream, half-and-half, and cream cheese. Whole-fat or sweetened yogurt. Full-fat   cheeses or blue cheese. Nondairy creamers and whipped toppings. Processed cheese, cheese spreads, or cheese  curds. °Condiments °Onion and garlic salt, seasoned salt, table salt, and sea salt. Canned and packaged gravies. Worcestershire sauce. Tartar sauce. Barbecue sauce. Teriyaki sauce. Soy sauce, including reduced sodium. Steak sauce. Fish sauce. Oyster sauce. Cocktail sauce. Horseradish. Ketchup and mustard. Meat flavorings and tenderizers. Bouillon cubes. Hot sauce. Tabasco sauce. Marinades. Taco seasonings. Relishes. °Fats and Oils °Butter, stick margarine, lard, shortening, ghee, and bacon fat. Coconut, palm kernel, or palm oils. Regular salad dressings. °Other °Pickles and olives. Salted popcorn and pretzels. °The items listed above may not be a complete list of foods and beverages to avoid. Contact your dietitian for more information. °WHERE CAN I FIND MORE INFORMATION? °National Heart, Lung, and [employer] Institute: www.nhlbi.nih.gov/health/health-topics/topics/dash/ °  °This information is not intended to replace advice given to you by your health care provider. Make sure you discuss any questions you have with your health care provider. °  °Document Released: 06/13/2011 Document Revised: 07/15/2014 Document Reviewed: 04/28/2013 °Elsevier Interactive Patient Education ©2016 Elsevier Inc. ° °Hypertension °Hypertension, commonly called high blood pressure, is when the force of blood pumping through your arteries is too strong. Your arteries are the blood vessels that carry blood from your heart throughout your body. A blood pressure reading consists of a higher number over a lower number, such as 110/72. The higher number (systolic) is the pressure inside your arteries when your heart pumps. The lower number (diastolic) is the pressure inside your arteries when your heart relaxes. Ideally you want your blood pressure below 120/80. °Hypertension forces your heart to work harder to pump blood. Your arteries may become narrow or stiff. Having untreated or uncontrolled hypertension can cause heart attack, stroke, kidney  disease, and other problems. °RISK FACTORS °Some risk factors for high blood pressure are controllable. Others are not.  °Risk factors you cannot control include:  °· Race. You may be at higher risk if you are African American. °· Age. Risk increases with age. °· Gender. Men are at higher risk than women before age 45 years. After age 65, women are at higher risk than men. °Risk factors you can control include: °· Not getting enough exercise or physical activity. °· Being overweight. °· Getting too much fat, sugar, calories, or salt in your diet. °· Drinking too much alcohol. °SIGNS AND SYMPTOMS °Hypertension does not usually cause signs or symptoms. Extremely high blood pressure (hypertensive crisis) may cause headache, anxiety, shortness of breath, and nosebleed. °DIAGNOSIS °To check if you have hypertension, your health care provider will measure your blood pressure while you are seated, with your arm held at the level of your heart. It should be measured at least twice using the same arm. Certain conditions can cause a difference in blood pressure between your right and left arms. A blood pressure reading that is higher than normal on one occasion does not mean that you need treatment. If it is not clear whether you have high blood pressure, you may be asked to return on a different day to have your blood pressure checked again. Or, you may be asked to monitor your blood pressure at home for 1 or more weeks. °TREATMENT °Treating high blood pressure includes making lifestyle changes and possibly taking medicine. Living a healthy lifestyle can help lower high blood pressure. You may need to change some of your habits. °Lifestyle changes may include: °· Following the DASH diet. This diet is high in fruits, vegetables, and whole   grains. It is low in salt, red meat, and added sugars. °· Keep your sodium intake below 2,300 mg per day. °· Getting at least 30-45 minutes of aerobic exercise at least 4 times per  week. °· Losing weight if necessary. °· Not smoking. °· Limiting alcoholic beverages. °· Learning ways to reduce stress. °Your health care provider may prescribe medicine if lifestyle changes are not enough to get your blood pressure under control, and if one of the following is true: °· You are 18-59 years of age and your systolic blood pressure is above 140. °· You are 60 years of age or older, and your systolic blood pressure is above 150. °· Your diastolic blood pressure is above 90. °· You have diabetes, and your systolic blood pressure is over 140 or your diastolic blood pressure is over 90. °· You have kidney disease and your blood pressure is above 140/90. °· You have heart disease and your blood pressure is above 140/90. °Your personal target blood pressure may vary depending on your medical conditions, your age, and other factors. °HOME CARE INSTRUCTIONS °· Have your blood pressure rechecked as directed by your health care provider.   °· Take medicines only as directed by your health care provider. Follow the directions carefully. Blood pressure medicines must be taken as prescribed. The medicine does not work as well when you skip doses. Skipping doses also puts you at risk for problems. °· Do not smoke.   °· Monitor your blood pressure at home as directed by your health care provider.  °SEEK MEDICAL CARE IF:  °· You think you are having a reaction to medicines taken. °· You have recurrent headaches or feel dizzy. °· You have swelling in your ankles. °· You have trouble with your vision. °SEEK IMMEDIATE MEDICAL CARE IF: °· You develop a severe headache or confusion. °· You have unusual weakness, numbness, or feel faint. °· You have severe chest or abdominal pain. °· You vomit repeatedly. °· You have trouble breathing. °MAKE SURE YOU:  °· Understand these instructions. °· Will watch your condition. °· Will get help right away if you are not doing well or get worse. °  °This information is not intended to  replace advice given to you by your health care provider. Make sure you discuss any questions you have with your health care provider. °  °Document Released: 06/24/2005 Document Revised: 11/08/2014 Document Reviewed: 04/16/2013 °Elsevier Interactive Patient Education ©2016 Elsevier Inc. ° °

## 2015-07-27 ENCOUNTER — Other Ambulatory Visit: Payer: Self-pay | Admitting: Hematology

## 2015-07-27 ENCOUNTER — Other Ambulatory Visit: Payer: Self-pay

## 2015-07-27 DIAGNOSIS — Z853 Personal history of malignant neoplasm of breast: Secondary | ICD-10-CM

## 2015-08-15 ENCOUNTER — Ambulatory Visit: Admitting: Family Medicine

## 2015-08-31 ENCOUNTER — Encounter

## 2015-09-01 ENCOUNTER — Other Ambulatory Visit: Payer: Self-pay | Admitting: Hematology

## 2015-09-07 ENCOUNTER — Encounter

## 2015-09-18 ENCOUNTER — Emergency Department (HOSPITAL_COMMUNITY)
Admission: EM | Admit: 2015-09-18 | Discharge: 2015-09-18 | Disposition: A | Discharge status: Home / Self Care | Attending: Emergency Medicine | Admitting: Emergency Medicine

## 2015-09-18 ENCOUNTER — Encounter (HOSPITAL_COMMUNITY): Payer: Self-pay | Admitting: Emergency Medicine

## 2015-09-18 ENCOUNTER — Ambulatory Visit: Admitting: Family Medicine

## 2015-09-18 ENCOUNTER — Emergency Department (HOSPITAL_COMMUNITY)
Admission: EM | Admit: 2015-09-18 | Discharge: 2015-09-18 | Disposition: A | Discharge status: Home / Self Care | Source: Home / Self Care

## 2015-09-18 DIAGNOSIS — M545 Low back pain: Secondary | ICD-10-CM | POA: Insufficient documentation

## 2015-09-18 DIAGNOSIS — I10 Essential (primary) hypertension: Secondary | ICD-10-CM | POA: Diagnosis not present

## 2015-09-18 DIAGNOSIS — J449 Chronic obstructive pulmonary disease, unspecified: Secondary | ICD-10-CM | POA: Diagnosis not present

## 2015-09-18 DIAGNOSIS — F17210 Nicotine dependence, cigarettes, uncomplicated: Secondary | ICD-10-CM | POA: Diagnosis not present

## 2015-09-18 NOTE — ED Notes (Signed)
Pt told Network engineer they were leaving.

## 2015-09-18 NOTE — ED Notes (Addendum)
Pt c/o upper back pain/ neck  x 1 week. Pt denies injury. Pt reports that she woke up with the pain and is unable to move her neck. Pt has not been taking her BP medication for a while. Pt is suppose to see [facility] but has the address as [address] which leads to a funeral home.

## 2015-09-20 ENCOUNTER — Emergency Department (HOSPITAL_COMMUNITY)
Admission: EM | Admit: 2015-09-20 | Discharge: 2015-09-20 | Disposition: A | Discharge status: Home / Self Care | Attending: Physician Assistant | Admitting: Physician Assistant

## 2015-09-20 ENCOUNTER — Emergency Department (HOSPITAL_COMMUNITY)

## 2015-09-20 ENCOUNTER — Encounter (HOSPITAL_COMMUNITY): Payer: Self-pay | Admitting: Emergency Medicine

## 2015-09-20 DIAGNOSIS — J449 Chronic obstructive pulmonary disease, unspecified: Secondary | ICD-10-CM | POA: Insufficient documentation

## 2015-09-20 DIAGNOSIS — Z8781 Personal history of (healed) traumatic fracture: Secondary | ICD-10-CM | POA: Insufficient documentation

## 2015-09-20 DIAGNOSIS — Z923 Personal history of irradiation: Secondary | ICD-10-CM | POA: Diagnosis not present

## 2015-09-20 DIAGNOSIS — F17210 Nicotine dependence, cigarettes, uncomplicated: Secondary | ICD-10-CM | POA: Insufficient documentation

## 2015-09-20 DIAGNOSIS — Z862 Personal history of diseases of the blood and blood-forming organs and certain disorders involving the immune mechanism: Secondary | ICD-10-CM | POA: Insufficient documentation

## 2015-09-20 DIAGNOSIS — Z8719 Personal history of other diseases of the digestive system: Secondary | ICD-10-CM | POA: Diagnosis not present

## 2015-09-20 DIAGNOSIS — I10 Essential (primary) hypertension: Secondary | ICD-10-CM | POA: Insufficient documentation

## 2015-09-20 DIAGNOSIS — Z79899 Other long term (current) drug therapy: Secondary | ICD-10-CM | POA: Diagnosis not present

## 2015-09-20 DIAGNOSIS — M546 Pain in thoracic spine: Secondary | ICD-10-CM | POA: Diagnosis not present

## 2015-09-20 DIAGNOSIS — R079 Chest pain, unspecified: Secondary | ICD-10-CM

## 2015-09-20 DIAGNOSIS — M25511 Pain in right shoulder: Secondary | ICD-10-CM | POA: Diagnosis not present

## 2015-09-20 DIAGNOSIS — M47896 Other spondylosis, lumbar region: Secondary | ICD-10-CM | POA: Insufficient documentation

## 2015-09-20 DIAGNOSIS — Z8639 Personal history of other endocrine, nutritional and metabolic disease: Secondary | ICD-10-CM | POA: Insufficient documentation

## 2015-09-20 DIAGNOSIS — Z853 Personal history of malignant neoplasm of breast: Secondary | ICD-10-CM | POA: Insufficient documentation

## 2015-09-20 DIAGNOSIS — M549 Dorsalgia, unspecified: Secondary | ICD-10-CM | POA: Diagnosis present

## 2015-09-20 LAB — CBC
HCT: 48.8 % — ABNORMAL HIGH (ref 36.0–46.0)
Hemoglobin: 16 g/dL — ABNORMAL HIGH (ref 12.0–15.0)
MCH: 30.8 pg (ref 26.0–34.0)
MCHC: 32.8 g/dL (ref 30.0–36.0)
MCV: 94 fL (ref 78.0–100.0)
PLATELETS: 374 10*3/uL (ref 150–400)
RBC: 5.19 MIL/uL — AB (ref 3.87–5.11)
RDW: 13.7 % (ref 11.5–15.5)
WBC: 7.1 10*3/uL (ref 4.0–10.5)

## 2015-09-20 LAB — RAPID URINE DRUG SCREEN, HOSP PERFORMED
Amphetamines: NOT DETECTED
BARBITURATES: NOT DETECTED
Benzodiazepines: NOT DETECTED
Cocaine: POSITIVE — AB
Opiates: NOT DETECTED
TETRAHYDROCANNABINOL: POSITIVE — AB

## 2015-09-20 LAB — BASIC METABOLIC PANEL
Anion gap: 8 (ref 5–15)
BUN: 14 mg/dL (ref 6–20)
CALCIUM: 9 mg/dL (ref 8.9–10.3)
CO2: 25 mmol/L (ref 22–32)
Chloride: 106 mmol/L (ref 101–111)
Creatinine, Ser: 0.87 mg/dL (ref 0.44–1.00)
GFR calc non Af Amer: >60 mL/min (ref >60)
GLUCOSE: 130 mg/dL — AB (ref 65–99)
Potassium: 3.3 mmol/L — ABNORMAL LOW (ref 3.5–5.1)
Sodium: 139 mmol/L (ref 135–145)

## 2015-09-20 LAB — I-STAT TROPONIN, ED
TROPONIN I, POC: 0.03 ng/mL (ref 0.00–0.08)
Troponin i, poc: 0.03 ng/mL (ref 0.00–0.08)

## 2015-09-20 MED ORDER — IOHEXOL 350 MG/ML SOLN
100.0000 mL | Freq: Once | INTRAVENOUS | Status: AC | PRN
  Administered 2015-09-20: 100 mL via INTRAVENOUS

## 2015-09-20 MED ORDER — OXYCODONE-ACETAMINOPHEN 5-325 MG PO TABS: 1.0000 | ORAL_TABLET | Freq: Four times a day (QID) | ORAL | Status: DC | PRN

## 2015-09-20 MED ORDER — OXYCODONE-ACETAMINOPHEN 5-325 MG PO TABS
1.0000 | ORAL_TABLET | Freq: Once | ORAL | Status: AC
  Administered 2015-09-20: 1 via ORAL
  Filled 2015-09-20: qty 1

## 2015-09-20 MED ORDER — METHOCARBAMOL 500 MG PO TABS
500.0000 mg | ORAL_TABLET | Freq: Once | ORAL | Status: AC
  Administered 2015-09-20: 500 mg via ORAL
  Filled 2015-09-20: qty 1

## 2015-09-20 NOTE — Discharge Instructions (Signed)
We are unsure what is causing the pain in your left scapula. Your CT and lab results and EKG have been reassuring. As you know you have very high blood pressure that needs to be controlled. We will need you to follow-up on Monday as planned with your primary care physician. If you develop any chest pain or other concerning symptoms please return immediately.   Back Pain, Adult Back pain is very common. The pain often gets better over time. The cause of back pain is usually not dangerous. Most people can learn to manage their back pain on their own.  HOME CARE  Watch your back pain for any changes. The following actions may help to lessen any pain you are feeling:  Stay active. Start with short walks on flat ground if you can. Try to walk farther each day.  Exercise regularly as told by your doctor. Exercise helps your back heal faster. It also helps avoid future injury by keeping your muscles strong and flexible.  Do not sit, drive, or stand in one place for more than 30 minutes.  Do not stay in bed. Resting more than 1-2 days can slow down your recovery.  Be careful when you bend or lift an object. Use good form when lifting:  Bend at your knees.  Keep the object close to your body.  Do not twist.  Sleep on a firm mattress. Lie on your side, and bend your knees. If you lie on your back, put a pillow under your knees.  Take medicines only as told by your doctor.  Put ice on the injured area.  Put ice in a plastic bag.  Place a towel between your skin and the bag.  Leave the ice on for 20 minutes, 2-3 times a day for the first 2-3 days. After that, you can switch between ice and heat packs.  Avoid feeling anxious or stressed. Find good ways to deal with stress, such as exercise.  Maintain a healthy weight. Extra weight puts stress on your back. GET HELP IF:   You have pain that does not go away with rest or medicine.  You have worsening pain that goes down into your legs or  buttocks.  You have pain that does not get better in one week.  You have pain at night.  You lose weight.  You have a fever or chills. GET HELP RIGHT AWAY IF:   You cannot control when you poop (bowel movement) or pee (urinate).  Your arms or legs feel weak.  Your arms or legs lose feeling (numbness).  You feel sick to your stomach (nauseous) or throw up (vomit).  You have belly (abdominal) pain.  You feel like you may pass out (faint).   This information is not intended to replace advice given to you by your health care provider. Make sure you discuss any questions you have with your health care provider.   Document Released: 12/11/2007 Document Revised: 07/15/2014 Document Reviewed: 10/26/2013 Elsevier Interactive Patient Education [employer].

## 2015-09-20 NOTE — ED Notes (Addendum)
Per pt, states upper back pain for over a week-states increased pain since Sunday-states has'nt taken BP meds in over 2 weeks

## 2015-09-20 NOTE — ED Notes (Signed)
Patient is hypertensive and states she has not taken her blood pressure medications in over 2 months because she has been out and no longer sees the prescribing doctor.
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
## 2015-09-25 ENCOUNTER — Encounter: Payer: Self-pay | Admitting: Family Medicine

## 2015-09-25 ENCOUNTER — Ambulatory Visit (INDEPENDENT_AMBULATORY_CARE_PROVIDER_SITE_OTHER): Admitting: Family Medicine

## 2015-09-25 VITALS — BP 150/90 | HR 85 | Temp 98.0°F | Resp 18 | Ht 62.0 in | Wt 127.0 lb

## 2015-09-25 DIAGNOSIS — F329 Major depressive disorder, single episode, unspecified: Secondary | ICD-10-CM | POA: Diagnosis not present

## 2015-09-25 DIAGNOSIS — F17200 Nicotine dependence, unspecified, uncomplicated: Secondary | ICD-10-CM

## 2015-09-25 DIAGNOSIS — R7989 Other specified abnormal findings of blood chemistry: Secondary | ICD-10-CM

## 2015-09-25 DIAGNOSIS — I10 Essential (primary) hypertension: Secondary | ICD-10-CM

## 2015-09-25 DIAGNOSIS — F32A Depression, unspecified: Secondary | ICD-10-CM

## 2015-09-25 DIAGNOSIS — F411 Generalized anxiety disorder: Secondary | ICD-10-CM | POA: Diagnosis not present

## 2015-09-25 DIAGNOSIS — F1410 Cocaine abuse, uncomplicated: Secondary | ICD-10-CM

## 2015-09-25 DIAGNOSIS — R799 Abnormal finding of blood chemistry, unspecified: Secondary | ICD-10-CM

## 2015-09-25 DIAGNOSIS — C50412 Malignant neoplasm of upper-outer quadrant of left female breast: Secondary | ICD-10-CM

## 2015-09-25 LAB — COMPLETE METABOLIC PANEL WITH GFR
ALBUMIN: 2.6 g/dL — AB (ref 3.6–5.1)
ALK PHOS: 112 U/L (ref 33–130)
ALT: 16 U/L (ref 6–29)
AST: 21 U/L (ref 10–35)
BUN: 14 mg/dL (ref 7–25)
CHLORIDE: 104 mmol/L (ref 98–110)
CO2: 27 mmol/L (ref 20–31)
Calcium: 8.9 mg/dL (ref 8.6–10.4)
Creat: 0.97 mg/dL (ref 0.50–0.99)
GFR, EST NON AFRICAN AMERICAN: 62 mL/min (ref >=60)
GFR, Est African American: 71 mL/min (ref >=60)
GLUCOSE: 106 mg/dL — AB (ref 65–99)
POTASSIUM: 3.9 mmol/L (ref 3.5–5.3)
SODIUM: 142 mmol/L (ref 135–146)
Total Bilirubin: 0.2 mg/dL (ref 0.2–1.2)
Total Protein: 6.6 g/dL (ref 6.1–8.1)

## 2015-09-25 LAB — POCT URINALYSIS DIP (DEVICE)
Bilirubin Urine: NEGATIVE
Glucose, UA: NEGATIVE mg/dL
Ketones, ur: NEGATIVE mg/dL
LEUKOCYTES UA: NEGATIVE
NITRITE: NEGATIVE
PH: 7 (ref 5.0–8.0)
Specific Gravity, Urine: 1.025 (ref 1.005–1.030)
UROBILINOGEN UA: 0.2 mg/dL (ref 0.0–1.0)

## 2015-09-25 MED ORDER — LISINOPRIL-HYDROCHLOROTHIAZIDE 20-12.5 MG PO TABS: 1.0000 | ORAL_TABLET | Freq: Every day | ORAL | Status: DC

## 2015-09-25 MED ORDER — NICOTINE 14 MG/24HR TD PT24: 14.0000 mg | MEDICATED_PATCH | Freq: Every day | TRANSDERMAL | Status: DC

## 2015-09-25 MED ORDER — AMLODIPINE BESYLATE 10 MG PO TABS: 10.0000 mg | ORAL_TABLET | Freq: Every morning | ORAL | Status: DC

## 2015-09-25 MED ORDER — BUSPIRONE HCL 5 MG PO TABS: 5.0000 mg | ORAL_TABLET | Freq: Two times a day (BID) | ORAL | Status: DC

## 2015-09-25 NOTE — Patient Instructions (Addendum)
[facility] [place], [place]   DASH Eating Plan DASH stands for "Dietary Approaches to Stop Hypertension." The DASH eating plan is a healthy eating plan that has been shown to reduce high blood pressure (hypertension). Additional health benefits may include reducing the risk of type 2 diabetes mellitus, heart disease, and stroke. The DASH eating plan may also help with weight loss. WHAT DO I NEED TO KNOW ABOUT THE DASH EATING PLAN? For the DASH eating plan, you will follow these general guidelines:  Choose foods with a percent daily value for sodium of less than 5% (as listed on the food label).  Use salt-free seasonings or herbs instead of table salt or sea salt.  Check with your health care provider or pharmacist before using salt substitutes.  Eat lower-sodium products, often labeled as "lower sodium" or "no salt added."  Eat fresh foods.  Eat more vegetables, fruits, and low-fat dairy products.  Choose whole grains. Look for the word "whole" as the first word in the ingredient list.  Choose fish and skinless chicken or [place] more often than red meat. Limit fish, poultry, and meat to 6 oz (170 g) each day.  Limit sweets, desserts, sugars, and sugary drinks.  Choose heart-healthy fats.  Limit cheese to 1 oz (28 g) per day.  Eat more home-cooked food and less restaurant, buffet, and fast food.  Limit fried foods.  Cook foods using methods other than frying.  Limit canned vegetables. If you do use them, rinse them well to decrease the sodium.  When eating at a restaurant, ask that your food be prepared with less salt, or no salt if possible. WHAT FOODS CAN I EAT? Seek help from a dietitian for individual calorie needs. Grains Whole grain or whole wheat bread. Brown rice. Whole grain or whole wheat pasta. Quinoa, bulgur, and whole grain cereals. Low-sodium cereals. Corn or whole wheat flour tortillas. Whole grain  cornbread. Whole grain crackers. Low-sodium crackers. Vegetables Fresh or frozen vegetables (raw, steamed, roasted, or grilled). Low-sodium or reduced-sodium tomato and vegetable juices. Low-sodium or reduced-sodium tomato sauce and paste. Low-sodium or reduced-sodium canned vegetables.  Fruits All fresh, canned (in natural juice), or frozen fruits. Meat and Other Protein Products Ground beef (85% or leaner), grass-fed beef, or beef trimmed of fat. Skinless chicken or [place]. Ground chicken or [place]. Pork trimmed of fat. All fish and seafood. Eggs. Dried beans, peas, or lentils. Unsalted nuts and seeds. Unsalted canned beans. Dairy Low-fat dairy products, such as skim or 1% milk, 2% or reduced-fat cheeses, low-fat ricotta or cottage cheese, or plain low-fat yogurt. Low-sodium or reduced-sodium cheeses. Fats and Oils Tub margarines without trans fats. Light or reduced-fat mayonnaise and salad dressings (reduced sodium). Avocado. Safflower, olive, or canola oils. Natural peanut or almond butter. Other Unsalted popcorn and pretzels. The items listed above may not be a complete list of recommended foods or beverages. Contact your dietitian for more options. WHAT FOODS ARE NOT RECOMMENDED? Grains White bread. White pasta. White rice. Refined cornbread. Bagels and croissants. Crackers that contain trans fat. Vegetables Creamed or fried vegetables. Vegetables in a cheese sauce. Regular canned vegetables. Regular canned tomato sauce and paste. Regular tomato and vegetable juices. Fruits Dried fruits. Canned fruit in light or heavy syrup. Fruit juice. Meat and Other Protein Products Fatty cuts of meat. Ribs, chicken wings, bacon, sausage, bologna, salami, chitterlings, fatback, hot dogs, bratwurst, and packaged luncheon meats. Salted nuts and seeds. Canned beans with salt. Dairy Whole  or 2% milk, cream, half-and-half, and cream cheese. Whole-fat or sweetened yogurt. Full-fat cheeses or blue cheese.  Nondairy creamers and whipped toppings. Processed cheese, cheese spreads, or cheese curds. Condiments Onion and garlic salt, seasoned salt, table salt, and sea salt. Canned and packaged gravies. Worcestershire sauce. Tartar sauce. Barbecue sauce. Teriyaki sauce. Soy sauce, including reduced sodium. Steak sauce. Fish sauce. Oyster sauce. Cocktail sauce. Horseradish. Ketchup and mustard. Meat flavorings and tenderizers. Bouillon cubes. Hot sauce. Tabasco sauce. Marinades. Taco seasonings. Relishes. Fats and Oils Butter, stick margarine, lard, shortening, ghee, and bacon fat. Coconut, palm kernel, or palm oils. Regular salad dressings. Other Pickles and olives. Salted popcorn and pretzels. The items listed above may not be a complete list of foods and beverages to avoid. Contact your dietitian for more information. WHERE CAN I FIND MORE INFORMATION? National Heart, Lung, and Blood Institute: travelstabloid.com   This information is not intended to replace advice given to you by your health care provider. Make sure you discuss any questions you have with your health care provider.   Document Released: 06/13/2011 Document Revised: 07/15/2014 Document Reviewed: 04/28/2013 Elsevier Interactive Patient Education 2016 [employer]. Hypertension Hypertension, commonly called high blood pressure, is when the force of blood pumping through your arteries is too strong. Your arteries are the blood vessels that carry blood from your heart throughout your body. A blood pressure reading consists of a higher number over a lower number, such as 110/72. The higher number (systolic) is the pressure inside your arteries when your heart pumps. The lower number (diastolic) is the pressure inside your arteries when your heart relaxes. Ideally you want your blood pressure below 120/80. Hypertension forces your heart to work harder to pump blood. Your arteries may become narrow or stiff.  Having untreated or uncontrolled hypertension can cause heart attack, stroke, kidney disease, and other problems. RISK FACTORS Some risk factors for high blood pressure are controllable. Others are not.  Risk factors you cannot control include:   Race. You may be at higher risk if you are African American.  Age. Risk increases with age.  Gender. Men are at higher risk than women before age 3 years. After age 49, women are at higher risk than men. Risk factors you can control include:  Not getting enough exercise or physical activity.  Being overweight.  Getting too much fat, sugar, calories, or salt in your diet.  Drinking too much alcohol. SIGNS AND SYMPTOMS Hypertension does not usually cause signs or symptoms. Extremely high blood pressure (hypertensive crisis) may cause headache, anxiety, shortness of breath, and nosebleed. DIAGNOSIS To check if you have hypertension, your health care provider will measure your blood pressure while you are seated, with your arm held at the level of your heart. It should be measured at least twice using the same arm. Certain conditions can cause a difference in blood pressure between your right and left arms. A blood pressure reading that is higher than normal on one occasion does not mean that you need treatment. If it is not clear whether you have high blood pressure, you may be asked to return on a different day to have your blood pressure checked again. Or, you may be asked to monitor your blood pressure at home for 1 or more weeks. TREATMENT Treating high blood pressure includes making lifestyle changes and possibly taking medicine. Living a healthy lifestyle can help lower high blood pressure. You may need to change some of your habits. Lifestyle changes may include:  Following  the DASH diet. This diet is high in fruits, vegetables, and whole grains. It is low in salt, red meat, and added sugars.  Keep your sodium intake below 2,300 mg per  day.  Getting at least 30-45 minutes of aerobic exercise at least 4 times per week.  Losing weight if necessary.  Not smoking.  Limiting alcoholic beverages.  Learning ways to reduce stress. Your health care provider may prescribe medicine if lifestyle changes are not enough to get your blood pressure under control, and if one of the following is true:  You are 35-2 years of age and your systolic blood pressure is above 140.  You are 15 years of age or older, and your systolic blood pressure is above 150.  Your diastolic blood pressure is above 90.  You have diabetes, and your systolic blood pressure is over [id number] or your diastolic blood pressure is over 90.  You have kidney disease and your blood pressure is above 140/90.  You have heart disease and your blood pressure is above 140/90. Your personal target blood pressure may vary depending on your medical conditions, your age, and other factors. HOME CARE INSTRUCTIONS  Have your blood pressure rechecked as directed by your health care provider.   Take medicines only as directed by your health care provider. Follow the directions carefully. Blood pressure medicines must be taken as prescribed. The medicine does not work as well when you skip doses. Skipping doses also puts you at risk for problems.  Do not smoke.   Monitor your blood pressure at home as directed by your health care provider. SEEK MEDICAL CARE IF:   You think you are having a reaction to medicines taken.  You have recurrent headaches or feel dizzy.  You have swelling in your ankles.  You have trouble with your vision. SEEK IMMEDIATE MEDICAL CARE IF:  You develop a severe headache or confusion.  You have unusual weakness, numbness, or feel faint.  You have severe chest or abdominal pain.  You vomit repeatedly.  You have trouble breathing. MAKE SURE YOU:   Understand these instructions.  Will watch your condition.  Will get help right away  if you are not doing well or get worse.   This information is not intended to replace advice given to you by your health care provider. Make sure you discuss any questions you have with your health care provider.   Document Released: 06/24/2005 Document Revised: 11/08/2014 Document Reviewed: 04/16/2013 Elsevier Interactive Patient Education 2016 [employer] Tobacco Use Disorder Tobacco use disorder (TUD) is a mental disorder. It is the long-term use of tobacco in spite of related health problems or difficulty with normal life activities. Tobacco is most commonly smoked as cigarettes and less commonly as cigars or pipes. Smokeless chewing tobacco and snuff are also popular. People with TUD get a feeling of extreme pleasure (euphoria) from using tobacco and have a desire to use it again and again. Repeated use of tobacco can cause problems. The addictive effects of tobacco are due mainly tothe ingredient nicotine. Nicotine also causes a rush of adrenaline (epinephrine) in the body. This leads to increased blood pressure, heart rate, and breathing rate. These changes may cause problems for people with high blood pressure, weak hearts, or lung disease. High doses of nicotine in children and pets can lead to seizures and death.  Tobacco contains a number of other unsafe chemicals. These chemicals are especially harmful when inhaled as smoke and can damage almost every organ in  the body. Smokers live shorter lives than nonsmokers and are at risk of dying from a number of diseases and cancers. Tobacco smoke can also cause health problems for nonsmokers (due to inhaling secondhand smoke). Smoking is also a fire hazard.  TUD usually starts in the late teenage years and is most common in young adults between the ages of 64 and 68 years. People who start smoking earlier in life are more likely to continue smoking as adults. TUD is somewhat more common in men than women. People with TUD are at higher risk for  using alcohol and other drugs of abuse. RISK FACTORS Risk factors for TUD include:   Having family members with the disorder.  Being around people who use tobacco.  Having an existing mental health issue such as schizophrenia, depression, bipolar disorder, ADHD, or posttraumatic stress disorder (PTSD). SIGNS AND SYMPTOMS  People with tobacco use disorder have two or more of the following signs and symptoms within 12 months:   Use of more tobacco over a longer period than intended.   Not able to cut down or control tobacco use.   A lot of time spent obtaining or using tobacco.   Strong desire or urge to use tobacco (craving). Cravings may last for 6 months or longer after quitting.  Use of tobacco even when use leads to major problems at work, school, or home.   Use of tobacco even when use leads to relationship problems.   Giving up or cutting down on important life activities because of tobacco use.   Repeatedly using tobacco in situations where it puts you or others in physical danger, like smoking in bed.   Use of tobacco even when it is known that a physical or mental problem is likely related to tobacco use.   Physical problems are numerous and may include chronic bronchitis, emphysema, lung and other cancers, gum disease, high blood pressure, heart disease, and stroke.   Mental problems caused by tobacco may include difficulty sleeping and anxiety.  Need to use greater amounts of tobacco to get the same effect. This means you have developed a tolerance.   Withdrawal symptoms as a result of stopping or rapidly cutting back use. These symptoms may last a month or more after quitting and include the following:   Depressed, anxious, or irritable mood.   Difficulty concentrating.   Increased appetite.  Restlessness or trouble sleeping.   Use of tobacco to avoid withdrawal symptoms. DIAGNOSIS  Tobacco use disorder is diagnosed by your health care  provider. A diagnosis may be made by:  Your health care provider asking questions about your tobacco use and any problems it may be causing.  A physical exam.  Lab tests.  You may be referred to a mental health professional or addiction specialist. The severity of tobacco use disorder depends on the number of signs and symptoms you have:   Mild--Two or three symptoms.  Moderate--Four or five symptoms.   Severe--Six or more symptoms.  TREATMENT  Many people with tobacco use disorder are unable to quit on their own and need help. Treatment options include the following:  Nicotine replacement therapy (NRT). NRT provides nicotine without the other harmful chemicals in tobacco. NRT gradually lowers the dosage of nicotine in the body and reduces withdrawal symptoms. NRT is available in over-the-counter forms (gum, lozenges, and skin patches) as well as prescription forms (mouth inhaler and nasal spray).  Medicines.This may include:  Antidepressant medicine that may reduce nicotine cravings.  A  medicine that acts on nicotine receptors in the brain to reduce cravings and withdrawal symptoms. It may also block the effects of tobacco in people with TUD who relapse.  Counseling or talk therapy. A form of talk therapy called behavioral therapy is commonly used to treat people with TUD. Behavioral therapy looks at triggers for tobacco use, how to avoid them, and how to cope with cravings. It is most effective in person or by phone but is also available in self-help forms (books and Internet websites).  Support groups. These provide emotional support, advice, and guidance for quitting tobacco. The most effective treatment for TUD is usually a combination of medicine, talk therapy, and support groups. HOME CARE INSTRUCTIONS  Keep all follow-up visits as directed by your health care provider. This is important.  Take medicines only as directed by your health care provider.  Check with your  health care provider before starting new prescription or over-the-counter medicines. SEEK MEDICAL CARE IF:  You are not able to take your medicines as prescribed.  Treatment is not helping your TUD and your symptoms get worse. SEEK IMMEDIATE MEDICAL CARE IF:  You have serious thoughts about hurting yourself or others.  You have trouble breathing, chest pain, sudden weakness, or sudden numbness in part of your body.   This information is not intended to replace advice given to you by your health care provider. Make sure you discuss any questions you have with your health care provider.   Document Released: 02/28/2004 Document Revised: 07/15/2014 Document Reviewed: 08/20/2013 Elsevier Interactive Patient Education 2016 [place]. Stimulant Use Disorder-Cocaine Cocaine is one of a group of powerful drugs called stimulants. Cocaine has medical uses for stopping nosebleeds and for pain control before minor nose or dental surgery. However, cocaine is misused because of the effects that it produces. These effects include:   A feeling of extreme pleasure.  Alertness.  High energy. Common street names for cocaine include coke, crack, blow, snow, and nose candy. Cocaine is snorted, dissolved in water and injected, or smoked.  Stimulants are addictive because they activate regions of the brain that produce both the pleasurable sensation of "reward" and psychological dependence. Together, these actions account for loss of control and the rapid development of drug dependence. This means you become ill without the drug (withdrawal) and need to keep using it to function.  Stimulant use disorder is use of stimulants that disrupts your daily life. It disrupts relationships with family and friends and how you do your job. Cocaine increases your blood pressure and heart rate. It can cause a heart attack or stroke. Cocaine can also cause death from irregular heart rate or seizures. SYMPTOMS Symptoms of  stimulant use disorder with cocaine include:  Use of cocaine in larger amounts or over a longer period of time than intended.  Unsuccessful attempts to cut down or control cocaine use.  A lot of time spent obtaining, using, or recovering from the effects of cocaine.  A strong desire or urge to use cocaine (craving).  Continued use of cocaine in spite of major problems at work, school, or home because of use.  Continued use of cocaine in spite of relationship problems because of use.  Giving up or cutting down on important life activities because of cocaine use.  Use of cocaine over and over in situations when it is physically hazardous, such as driving a car.  Continued use of cocaine in spite of a physical problem that is likely related to use. Physical  problems can include:  Malnutrition.  Nosebleeds.  Chest pain.  High blood pressure.  A hole that develops between the part of your nose that separates your nostrils (perforated nasal septum).  Lung and kidney damage.  Continued use of cocaine in spite of a mental problem that is likely related to use. Mental problems can include:  Schizophrenia-like symptoms.  Depression.  Bipolar mood swings.  Anxiety.  Sleep problems.  Need to use more and more cocaine to get the same effect, or lessened effect over time with use of the same amount of cocaine (tolerance).  Having withdrawal symptoms when cocaine use is stopped, or using cocaine to reduce or avoid withdrawal symptoms. Withdrawal symptoms include:  Depressed or irritable mood.  Low energy or restlessness.  Bad dreams.  Poor or excessive sleep.  Increased appetite. DIAGNOSIS Stimulant use disorder is diagnosed by your health care provider. You may be asked questions about your cocaine use and how it affects your life. A physical exam may be done. A drug screen may be ordered. You may be referred to a mental health professional. The diagnosis of stimulant use  disorder requires at least two symptoms within 12 months. The type of stimulant use disorder depends on the number of signs and symptoms you have. The type may be:  Mild. Two or three signs and symptoms.  Moderate. Four or five signs and symptoms.  Severe. Six or more signs and symptoms. TREATMENT Treatment for stimulant use disorder is usually provided by mental health professionals with training in substance use disorders. The following options are available:  Counseling or talk therapy. Talk therapy addresses the reasons you use cocaine and ways to keep you from using again. Goals of talk therapy include:  Identifying and avoiding triggers for use.  Handling cravings.  Replacing use with healthy activities.  Support groups. Support groups provide emotional support, advice, and guidance.  Medicine. Certain medicines may decrease cocaine cravings or withdrawal symptoms. HOME CARE INSTRUCTIONS  Take medicines only as directed by your health care provider.  Identify the people and activities that trigger your cocaine use and avoid them.  Keep all follow-up visits as directed by your health care provider. SEEK MEDICAL CARE IF:  Your symptoms get worse or you relapse.  You are not able to take medicines as directed. SEEK IMMEDIATE MEDICAL CARE IF:  You have serious thoughts about hurting yourself or others.  You have a seizure, chest pain, sudden weakness, or loss of speech or vision. [place] on Drug Abuse: motorcyclefax.com  Substance Abuse and Mental Health Services Administration: ktimeonline.com   This information is not intended to replace advice given to you by your health care provider. Make sure you discuss any questions you have with your health care provider.   Document Released: 06/21/2000 Document Revised: 07/15/2014 Document Reviewed: 07/07/2013 Elsevier Interactive Patient Education [employer].
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
## 2015-09-26 LAB — BRAIN NATRIURETIC PEPTIDE: Brain Natriuretic Peptide: 1212.5 pg/mL — ABNORMAL HIGH (ref <100)

## 2015-09-28 ENCOUNTER — Encounter

## 2015-09-28 DIAGNOSIS — F411 Generalized anxiety disorder: Secondary | ICD-10-CM

## 2015-09-28 HISTORY — DX: Generalized anxiety disorder: F41.1

## 2015-10-16 ENCOUNTER — Encounter

## 2015-10-24 ENCOUNTER — Encounter

## 2015-10-30 ENCOUNTER — Encounter

## 2015-11-01 ENCOUNTER — Encounter: Payer: Self-pay | Admitting: Family Medicine

## 2015-11-01 ENCOUNTER — Ambulatory Visit (INDEPENDENT_AMBULATORY_CARE_PROVIDER_SITE_OTHER): Admitting: Family Medicine

## 2015-11-01 VITALS — BP 152/78 | HR 73 | Temp 98.5°F | Resp 14 | Ht 62.0 in | Wt 122.0 lb

## 2015-11-01 DIAGNOSIS — I10 Essential (primary) hypertension: Secondary | ICD-10-CM | POA: Diagnosis not present

## 2015-11-01 DIAGNOSIS — R809 Proteinuria, unspecified: Secondary | ICD-10-CM

## 2015-11-01 DIAGNOSIS — F411 Generalized anxiety disorder: Secondary | ICD-10-CM

## 2015-11-01 DIAGNOSIS — F1410 Cocaine abuse, uncomplicated: Secondary | ICD-10-CM

## 2015-11-01 DIAGNOSIS — Z720 Tobacco use: Secondary | ICD-10-CM

## 2015-11-01 DIAGNOSIS — F32A Depression, unspecified: Secondary | ICD-10-CM

## 2015-11-01 DIAGNOSIS — F329 Major depressive disorder, single episode, unspecified: Secondary | ICD-10-CM

## 2015-11-01 LAB — POCT URINALYSIS DIP (DEVICE)
BILIRUBIN URINE: NEGATIVE
GLUCOSE, UA: NEGATIVE mg/dL
KETONES UR: NEGATIVE mg/dL
Leukocytes, UA: NEGATIVE
Nitrite: NEGATIVE
SPECIFIC GRAVITY, URINE: 1.025 (ref 1.005–1.030)
Urobilinogen, UA: 0.2 mg/dL (ref 0.0–1.0)
pH: 6 (ref 5.0–8.0)

## 2015-11-01 MED ORDER — AMLODIPINE BESYLATE 10 MG PO TABS: 10.0000 mg | ORAL_TABLET | Freq: Every morning | ORAL | Status: DC

## 2015-11-01 MED ORDER — BUSPIRONE HCL 5 MG PO TABS: 5.0000 mg | ORAL_TABLET | Freq: Two times a day (BID) | ORAL | Status: DC

## 2015-11-01 MED ORDER — LISINOPRIL-HYDROCHLOROTHIAZIDE 20-12.5 MG PO TABS: 1.0000 | ORAL_TABLET | Freq: Every day | ORAL | Status: DC

## 2015-11-01 NOTE — Patient Instructions (Addendum)
[place], [place] [id number] [phone] Eating Plan DASH stands for "Dietary Approaches to Stop Hypertension." The DASH eating plan is a healthy eating plan that has been shown to reduce high blood pressure (hypertension). Additional health benefits may include reducing the risk of type 2 diabetes mellitus, heart disease, and stroke. The DASH eating plan may also help with weight loss. WHAT DO I NEED TO KNOW ABOUT THE DASH EATING PLAN? For the DASH eating plan, you will follow these general guidelines:  Choose foods with a percent daily value for sodium of less than 5% (as listed on the food label).  Use salt-free seasonings or herbs instead of table salt or sea salt.  Check with your health care provider or pharmacist before using salt substitutes.  Eat lower-sodium products, often labeled as "lower sodium" or "no salt added."  Eat fresh foods.  Eat more vegetables, fruits, and low-fat dairy products.  Choose whole grains. Look for the word "whole" as the first word in the ingredient list.  Choose fish and skinless chicken or [place] more often than red meat. Limit fish, poultry, and meat to 6 oz (170 g) each day.  Limit sweets, desserts, sugars, and sugary drinks.  Choose heart-healthy fats.  Limit cheese to 1 oz (28 g) per day.  Eat more home-cooked food and less restaurant, buffet, and fast food.  Limit fried foods.  Cook foods using methods other than frying.  Limit canned vegetables. If you do use them, rinse them well to decrease the sodium.  When eating at a restaurant, ask that your food be prepared with less salt, or no salt if possible. WHAT FOODS CAN I EAT? Seek help from a dietitian for individual calorie needs. Grains Whole grain or whole wheat bread. Brown rice. Whole grain or whole wheat pasta. Quinoa, bulgur, and whole grain cereals. Low-sodium cereals. Corn or whole wheat flour tortillas. Whole grain cornbread.  Whole grain crackers. Low-sodium crackers. Vegetables Fresh or frozen vegetables (raw, steamed, roasted, or grilled). Low-sodium or reduced-sodium tomato and vegetable juices. Low-sodium or reduced-sodium tomato sauce and paste. Low-sodium or reduced-sodium canned vegetables.  Fruits All fresh, canned (in natural juice), or frozen fruits. Meat and Other Protein Products Ground beef (85% or leaner), grass-fed beef, or beef trimmed of fat. Skinless chicken or [place]. Ground chicken or [place]. Pork trimmed of fat. All fish and seafood. Eggs. Dried beans, peas, or lentils. Unsalted nuts and seeds. Unsalted canned beans. Dairy Low-fat dairy products, such as skim or 1% milk, 2% or reduced-fat cheeses, low-fat ricotta or cottage cheese, or plain low-fat yogurt. Low-sodium or reduced-sodium cheeses. Fats and Oils Tub margarines without trans fats. Light or reduced-fat mayonnaise and salad dressings (reduced sodium). Avocado. Safflower, olive, or canola oils. Natural peanut or almond butter. Other Unsalted popcorn and pretzels. The items listed above may not be a complete list of recommended foods or beverages. Contact your dietitian for more options. WHAT FOODS ARE NOT RECOMMENDED? Grains White bread. White pasta. White rice. Refined cornbread. Bagels and croissants. Crackers that contain trans fat. Vegetables Creamed or fried vegetables. Vegetables in a cheese sauce. Regular canned vegetables. Regular canned tomato sauce and paste. Regular tomato and vegetable juices. Fruits Dried fruits. Canned fruit in light or heavy syrup. Fruit juice. Meat and Other Protein Products Fatty cuts of meat. Ribs, chicken wings, bacon, sausage, bologna, salami, chitterlings, fatback, hot dogs, bratwurst, and packaged luncheon meats. Salted nuts and seeds. Canned beans with salt. Dairy Whole or 2%  milk, cream, half-and-half, and cream cheese. Whole-fat or sweetened yogurt. Full-fat cheeses or blue cheese. Nondairy  creamers and whipped toppings. Processed cheese, cheese spreads, or cheese curds. Condiments Onion and garlic salt, seasoned salt, table salt, and sea salt. Canned and packaged gravies. Worcestershire sauce. Tartar sauce. Barbecue sauce. Teriyaki sauce. Soy sauce, including reduced sodium. Steak sauce. Fish sauce. Oyster sauce. Cocktail sauce. Horseradish. Ketchup and mustard. Meat flavorings and tenderizers. Bouillon cubes. Hot sauce. Tabasco sauce. Marinades. Taco seasonings. Relishes. Fats and Oils Butter, stick margarine, lard, shortening, ghee, and bacon fat. Coconut, palm kernel, or palm oils. Regular salad dressings. Other Pickles and olives. Salted popcorn and pretzels. The items listed above may not be a complete list of foods and beverages to avoid. Contact your dietitian for more information. WHERE CAN I FIND MORE INFORMATION? National Heart, Lung, and Blood Institute: travelstabloid.com   This information is not intended to replace advice given to you by your health care provider. Make sure you discuss any questions you have with your health care provider.   Document Released: 06/13/2011 Document Revised: 07/15/2014 Document Reviewed: 04/28/2013 Elsevier Interactive Patient Education 2016 [place]. Stimulant Use Disorder-Cocaine Cocaine is one of a group of powerful drugs called stimulants. Cocaine has medical uses for stopping nosebleeds and for pain control before minor nose or dental surgery. However, cocaine is misused because of the effects that it produces. These effects include:   A feeling of extreme pleasure.  Alertness.  High energy. Common street names for cocaine include coke, crack, blow, snow, and nose candy. Cocaine is snorted, dissolved in water and injected, or smoked.  Stimulants are addictive because they activate regions of the brain that produce both the pleasurable sensation of "reward" and psychological dependence.  Together, these actions account for loss of control and the rapid development of drug dependence. This means you become ill without the drug (withdrawal) and need to keep using it to function.  Stimulant use disorder is use of stimulants that disrupts your daily life. It disrupts relationships with family and friends and how you do your job. Cocaine increases your blood pressure and heart rate. It can cause a heart attack or stroke. Cocaine can also cause death from irregular heart rate or seizures. SYMPTOMS Symptoms of stimulant use disorder with cocaine include:  Use of cocaine in larger amounts or over a longer period of time than intended.  Unsuccessful attempts to cut down or control cocaine use.  A lot of time spent obtaining, using, or recovering from the effects of cocaine.  A strong desire or urge to use cocaine (craving).  Continued use of cocaine in spite of major problems at work, school, or home because of use.  Continued use of cocaine in spite of relationship problems because of use.  Giving up or cutting down on important life activities because of cocaine use.  Use of cocaine over and over in situations when it is physically hazardous, such as driving a car.  Continued use of cocaine in spite of a physical problem that is likely related to use. Physical problems can include:  Malnutrition.  Nosebleeds.  Chest pain.  High blood pressure.  A hole that develops between the part of your nose that separates your nostrils (perforated nasal septum).  Lung and kidney damage.  Continued use of cocaine in spite of a mental problem that is likely related to use. Mental problems can include:  Schizophrenia-like symptoms.  Depression.  Bipolar mood swings.  Anxiety.  Sleep problems.  Need  to use more and more cocaine to get the same effect, or lessened effect over time with use of the same amount of cocaine (tolerance).  Having withdrawal symptoms when cocaine use  is stopped, or using cocaine to reduce or avoid withdrawal symptoms. Withdrawal symptoms include:  Depressed or irritable mood.  Low energy or restlessness.  Bad dreams.  Poor or excessive sleep.  Increased appetite. DIAGNOSIS Stimulant use disorder is diagnosed by your health care provider. You may be asked questions about your cocaine use and how it affects your life. A physical exam may be done. A drug screen may be ordered. You may be referred to a mental health professional. The diagnosis of stimulant use disorder requires at least two symptoms within 12 months. The type of stimulant use disorder depends on the number of signs and symptoms you have. The type may be:  Mild. Two or three signs and symptoms.  Moderate. Four or five signs and symptoms.  Severe. Six or more signs and symptoms. TREATMENT Treatment for stimulant use disorder is usually provided by mental health professionals with training in substance use disorders. The following options are available:  Counseling or talk therapy. Talk therapy addresses the reasons you use cocaine and ways to keep you from using again. Goals of talk therapy include:  Identifying and avoiding triggers for use.  Handling cravings.  Replacing use with healthy activities.  Support groups. Support groups provide emotional support, advice, and guidance.  Medicine. Certain medicines may decrease cocaine cravings or withdrawal symptoms. HOME CARE INSTRUCTIONS  Take medicines only as directed by your health care provider.  Identify the people and activities that trigger your cocaine use and avoid them.  Keep all follow-up visits as directed by your health care provider. SEEK MEDICAL CARE IF:  Your symptoms get worse or you relapse.  You are not able to take medicines as directed. SEEK IMMEDIATE MEDICAL CARE IF:  You have serious thoughts about hurting yourself or others.  You have a seizure, chest pain, sudden weakness, or loss  of speech or vision. [place] on Drug Abuse: motorcyclefax.com  Substance Abuse and Mental Health Services Administration: ktimeonline.com   This information is not intended to replace advice given to you by your health care provider. Make sure you discuss any questions you have with your health care provider.   Document Released: 06/21/2000 Document Revised: 07/15/2014 Document Reviewed: 07/07/2013 Elsevier Interactive Patient Education [employer].
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
## 2015-11-02 ENCOUNTER — Encounter

## 2015-11-02 ENCOUNTER — Telehealth: Payer: Self-pay | Admitting: Family Medicine

## 2015-11-02 DIAGNOSIS — R809 Proteinuria, unspecified: Secondary | ICD-10-CM

## 2015-11-02 HISTORY — DX: Proteinuria, unspecified: R80.9

## 2015-11-02 LAB — MICROALBUMIN / CREATININE URINE RATIO
Creatinine, Urine: 111 mg/dL (ref 20–320)
MICROALB UR: 267.6 mg/dL
MICROALB/CREAT RATIO: 2411 ug/mg{creat} — AB (ref <30)

## 2015-11-02 NOTE — Telephone Encounter (Signed)
Reviewed labs, microalbuminuria. Will continue ACE inhibitor at current dosage.   Dorena Dew, FNP

## 2015-11-06 ENCOUNTER — Ambulatory Visit
Admission: RE | Admit: 2015-11-06 | Discharge: 2015-11-06 | Disposition: A | Discharge status: Home / Self Care | Source: Ambulatory Visit | Attending: Hematology | Admitting: Hematology

## 2015-11-06 DIAGNOSIS — Z853 Personal history of malignant neoplasm of breast: Secondary | ICD-10-CM

## 2016-01-31 ENCOUNTER — Ambulatory Visit: Admitting: Family Medicine

## 2016-06-21 ENCOUNTER — Ambulatory Visit: Admitting: Family Medicine

## 2016-06-25 ENCOUNTER — Ambulatory Visit (INDEPENDENT_AMBULATORY_CARE_PROVIDER_SITE_OTHER): Admitting: Family Medicine

## 2016-06-25 ENCOUNTER — Encounter: Payer: Self-pay | Admitting: Family Medicine

## 2016-06-25 VITALS — BP 158/81 | HR 64 | Temp 98.2°F | Resp 18 | Ht 62.0 in | Wt 111.2 lb

## 2016-06-25 DIAGNOSIS — G629 Polyneuropathy, unspecified: Secondary | ICD-10-CM | POA: Diagnosis not present

## 2016-06-25 DIAGNOSIS — R8290 Unspecified abnormal findings in urine: Secondary | ICD-10-CM

## 2016-06-25 DIAGNOSIS — N390 Urinary tract infection, site not specified: Secondary | ICD-10-CM

## 2016-06-25 DIAGNOSIS — F411 Generalized anxiety disorder: Secondary | ICD-10-CM | POA: Diagnosis not present

## 2016-06-25 DIAGNOSIS — F17200 Nicotine dependence, unspecified, uncomplicated: Secondary | ICD-10-CM

## 2016-06-25 DIAGNOSIS — I10 Essential (primary) hypertension: Secondary | ICD-10-CM | POA: Diagnosis not present

## 2016-06-25 DIAGNOSIS — E559 Vitamin D deficiency, unspecified: Secondary | ICD-10-CM

## 2016-06-25 DIAGNOSIS — F1410 Cocaine abuse, uncomplicated: Secondary | ICD-10-CM | POA: Diagnosis not present

## 2016-06-25 DIAGNOSIS — C50412 Malignant neoplasm of upper-outer quadrant of left female breast: Secondary | ICD-10-CM

## 2016-06-25 DIAGNOSIS — R7989 Other specified abnormal findings of blood chemistry: Secondary | ICD-10-CM

## 2016-06-25 MED ORDER — LISINOPRIL-HYDROCHLOROTHIAZIDE 20-12.5 MG PO TABS: 1.0000 | ORAL_TABLET | Freq: Every day | ORAL | 1 refills | Status: DC

## 2016-06-25 MED ORDER — AMLODIPINE BESYLATE 10 MG PO TABS: 10.0000 mg | ORAL_TABLET | Freq: Every morning | ORAL | 1 refills | Status: DC

## 2016-06-25 MED ORDER — CIPROFLOXACIN HCL 500 MG PO TABS: 500.0000 mg | ORAL_TABLET | Freq: Two times a day (BID) | ORAL | 0 refills | Status: DC

## 2016-06-25 MED ORDER — GABAPENTIN 300 MG PO CAPS: 300.0000 mg | ORAL_CAPSULE | Freq: Three times a day (TID) | ORAL | 0 refills | Status: AC

## 2016-06-25 MED ORDER — BUSPIRONE HCL 10 MG PO TABS: 10.0000 mg | ORAL_TABLET | Freq: Two times a day (BID) | ORAL | 2 refills | Status: DC

## 2016-06-25 NOTE — Progress Notes (Signed)
Patient is here for FU joint Pain.  Patient complains of a constant right hip ache which is scaled currently at an 8. Patient states pain increases with walking.  Patient has taken medication today. Patient has not eaten today.  Patient declined the flu vaccine.
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
## 2016-06-25 NOTE — Patient Instructions (Addendum)
Generalized Anxiety Disorder Generalized anxiety disorder (GAD) is a mental disorder. It interferes with life functions, including relationships, work, and school. GAD is different from normal anxiety, which everyone experiences at some point in their lives in response to specific life events and activities. Normal anxiety actually helps [place] prepare for and get through these life events and activities. Normal anxiety goes away after the event or activity is over.  GAD causes anxiety that is not necessarily related to specific events or activities. It also causes excess anxiety in proportion to specific events or activities. The anxiety associated with GAD is also difficult to control. GAD can vary from mild to severe. People with severe GAD can have intense waves of anxiety with physical symptoms (panic attacks).  SYMPTOMS The anxiety and worry associated with GAD are difficult to control. This anxiety and worry are related to many life events and activities and also occur more days than not for 6 months or longer. People with GAD also have three or more of the following symptoms (one or more in children):  Restlessness.   Fatigue.  Difficulty concentrating.   Irritability.  Muscle tension.  Difficulty sleeping or unsatisfying sleep. DIAGNOSIS GAD is diagnosed through an assessment by your health care provider. Your health care provider will ask you questions aboutyour mood,physical symptoms, and events in your life. Your health care provider may ask you about your medical history and use of alcohol or drugs, including prescription medicines. Your health care provider may also do a physical exam and blood tests. Certain medical conditions and the use of certain substances can cause symptoms similar to those associated with GAD. Your health care provider may refer you to a mental health specialist for further evaluation. TREATMENT The following therapies are usually used to treat GAD:    Medication. Antidepressant medication usually is prescribed for long-term daily control. Antianxiety medicines may be added in severe cases, especially when panic attacks occur.   Talk therapy (psychotherapy). Certain types of talk therapy can be helpful in treating GAD by providing support, education, and guidance. A form of talk therapy called cognitive behavioral therapy can teach you healthy ways to think about and react to daily life events and activities.  Stress managementtechniques. These include yoga, meditation, and exercise and can be very helpful when they are practiced regularly. A mental health specialist can help determine which treatment is best for you. Some people see improvement with one therapy. However, other people require a combination of therapies. This information is not intended to replace advice given to you by your health care provider. Make sure you discuss any questions you have with your health care provider. Document Released: 10/19/2012 Document Revised: 07/15/2014 Document Reviewed: 10/19/2012 Elsevier Interactive Patient Education  2017 [place] dependency is an addiction to drugs or alcohol. It is characterized by the repeated behavior of seeking out and using drugs and alcohol despite harmful consequences to the health and safety of ones self and others.  RISK FACTORS There are certain situations or behaviors that increase a person's risk for chemical dependency. These include:  A family history of chemical dependency.  A history of mental health issues, including depression and anxiety.  A home environment where drugs and alcohol are easily available to you.  Drug or alcohol use at a young age. SYMPTOMS  The following symptoms can indicate chemical dependency:  Inability to limit the use of drugs or alcohol.  Nausea, sweating, shakiness, and anxiety that occurs when alcohol  or drugs are not being used.  An  increase in amount of drugs or alcohol that is necessary to get drunk or high. People who experience these symptoms can assess their use of drugs and alcohol by asking themselves the following questions:  Have you been told by friends or family that they are worried about your use of alcohol or drugs?  Do friends and family ever tell you about things you did while drinking alcohol or using drugs that you do not remember?  Do you lie about using alcohol or drugs or about the amounts you use?  Do you have difficulty completing daily tasks unless you use alcohol or drugs?  Is the level of your work or school performance lower because of your drug or alcohol use?  Do you get sick from using drugs or alcohol but keep using anyway?  Do you feel uncomfortable in social situations unless you use alcohol or drugs?  Do you use drugs or alcohol to help forget problems? An answer of yes to any of these questions may indicate chemical dependency. Professional evaluation is suggested. This information is not intended to replace advice given to you by your health care provider. Make sure you discuss any questions you have with your health care provider. Document Released: 06/18/2001 Document Revised: 09/16/2011 Document Reviewed: 03/01/2015 Elsevier Interactive Patient Education  2017 [place].  Hypertension Hypertension, commonly called high blood pressure, is when the force of blood pumping through your arteries is too strong. Your arteries are the blood vessels that carry blood from your heart throughout your body. A blood pressure reading consists of a higher number over a lower number, such as 110/72. The higher number (systolic) is the pressure inside your arteries when your heart pumps. The lower number (diastolic) is the pressure inside your arteries when your heart relaxes. Ideally you want your blood pressure below 120/80. Hypertension forces your heart to work harder to pump blood. Your  arteries may become narrow or stiff. Having untreated or uncontrolled hypertension can cause heart attack, stroke, kidney disease, and other problems. What increases the risk? Some risk factors for high blood pressure are controllable. Others are not. Risk factors you cannot control include:  Race. You may be at higher risk if you are African American.  Age. Risk increases with age.  Gender. Men are at higher risk than women before age 40 years. After age 35, women are at higher risk than men. Risk factors you can control include:  Not getting enough exercise or physical activity.  Being overweight.  Getting too much fat, sugar, calories, or salt in your diet.  Drinking too much alcohol. What are the signs or symptoms? Hypertension does not usually cause signs or symptoms. Extremely high blood pressure (hypertensive crisis) may cause headache, anxiety, shortness of breath, and nosebleed. How is this diagnosed? To check if you have hypertension, your health care provider will measure your blood pressure while you are seated, with your arm held at the level of your heart. It should be measured at least twice using the same arm. Certain conditions can cause a difference in blood pressure between your right and left arms. A blood pressure reading that is higher than normal on one occasion does not mean that you need treatment. If it is not clear whether you have high blood pressure, you may be asked to return on a different day to have your blood pressure checked again. Or, you may be asked to monitor your blood pressure at  home for 1 or more weeks. How is this treated? Treating high blood pressure includes making lifestyle changes and possibly taking medicine. Living a healthy lifestyle can help lower high blood pressure. You may need to change some of your habits. Lifestyle changes may include:  Following the DASH diet. This diet is high in fruits, vegetables, and whole grains. It is low in  salt, red meat, and added sugars.  Keep your sodium intake below 2,300 mg per day.  Getting at least 30-45 minutes of aerobic exercise at least 4 times per week.  Losing weight if necessary.  Not smoking.  Limiting alcoholic beverages.  Learning ways to reduce stress. Your health care provider may prescribe medicine if lifestyle changes are not enough to get your blood pressure under control, and if one of the following is true:  You are 60-20 years of age and your systolic blood pressure is above 140.  You are 31 years of age or older, and your systolic blood pressure is above 150.  Your diastolic blood pressure is above 90.  You have diabetes, and your systolic blood pressure is over [id number] or your diastolic blood pressure is over 90.  You have kidney disease and your blood pressure is above 140/90.  You have heart disease and your blood pressure is above 140/90. Your personal target blood pressure may vary depending on your medical conditions, your age, and other factors. Follow these instructions at home:  Have your blood pressure rechecked as directed by your health care provider.  Take medicines only as directed by your health care provider. Follow the directions carefully. Blood pressure medicines must be taken as prescribed. The medicine does not work as well when you skip doses. Skipping doses also puts you at risk for problems.  Do not smoke.  Monitor your blood pressure at home as directed by your health care provider. Contact a health care provider if:  You think you are having a reaction to medicines taken.  You have recurrent headaches or feel dizzy.  You have swelling in your ankles.  You have trouble with your vision. Get help right away if:  You develop a severe headache or confusion.  You have unusual weakness, numbness, or feel faint.  You have severe chest or abdominal pain.  You vomit repeatedly.  You have trouble breathing. This information  is not intended to replace advice given to you by your health care provider. Make sure you discuss any questions you have with your health care provider. Document Released: 06/24/2005 Document Revised: 11/30/2015 Document Reviewed: 04/16/2013 Elsevier Interactive Patient Education  2017 [employer].

## 2016-06-26 LAB — POCT URINALYSIS DIP (DEVICE)
Bilirubin Urine: NEGATIVE
GLUCOSE, UA: NEGATIVE mg/dL
KETONES UR: NEGATIVE mg/dL
Leukocytes, UA: NEGATIVE
Nitrite: POSITIVE — AB
SPECIFIC GRAVITY, URINE: 1.025 (ref 1.005–1.030)
Urobilinogen, UA: 0.2 mg/dL (ref 0.0–1.0)
pH: 5.5 (ref 5.0–8.0)

## 2016-06-26 LAB — COMPLETE METABOLIC PANEL WITH GFR
ALBUMIN: 2.5 g/dL — AB (ref 3.6–5.1)
ALK PHOS: 95 U/L (ref 33–130)
ALT: 12 U/L (ref 6–29)
AST: 24 U/L (ref 10–35)
BILIRUBIN TOTAL: 0.3 mg/dL (ref 0.2–1.2)
BUN: 21 mg/dL (ref 7–25)
CO2: 25 mmol/L (ref 20–31)
CREATININE: 1.12 mg/dL — AB (ref 0.50–0.99)
Calcium: 8.4 mg/dL — ABNORMAL LOW (ref 8.6–10.4)
Chloride: 105 mmol/L (ref 98–110)
GFR, Est African American: 60 mL/min (ref >=60)
GFR, Est Non African American: 52 mL/min — ABNORMAL LOW (ref >=60)
GLUCOSE: 102 mg/dL — AB (ref 65–99)
Potassium: 3.8 mmol/L (ref 3.5–5.3)
SODIUM: 139 mmol/L (ref 135–146)
TOTAL PROTEIN: 7.2 g/dL (ref 6.1–8.1)

## 2016-06-26 LAB — CBC WITH DIFFERENTIAL/PLATELET
BASOS ABS: 0 {cells}/uL (ref 0–200)
BASOS PCT: 0 %
EOS ABS: 108 {cells}/uL (ref 15–500)
Eosinophils Relative: 2 %
HEMATOCRIT: 41 % (ref 35.0–45.0)
Hemoglobin: 13.2 g/dL (ref 11.7–15.5)
LYMPHS PCT: 21 %
Lymphs Abs: 1134 cells/uL (ref 850–3900)
MCH: 28.9 pg (ref 27.0–33.0)
MCHC: 32.2 g/dL (ref 32.0–36.0)
MCV: 89.9 fL (ref 80.0–100.0)
MONO ABS: 594 {cells}/uL (ref 200–950)
MONOS PCT: 11 %
MPV: 9.7 fL (ref 7.5–12.5)
NEUTROS PCT: 66 %
Neutro Abs: 3564 cells/uL (ref 1500–7800)
PLATELETS: 401 10*3/uL — AB (ref 140–400)
RBC: 4.56 MIL/uL (ref 3.80–5.10)
RDW: 14.9 % (ref 11.0–15.0)
WBC: 5.4 10*3/uL (ref 3.8–10.8)

## 2016-06-26 LAB — TSH: TSH: 1.54 mIU/L

## 2016-06-26 LAB — VITAMIN D 25 HYDROXY (VIT D DEFICIENCY, FRACTURES): Vit D, 25-Hydroxy: 9 ng/mL — ABNORMAL LOW (ref 30–100)

## 2016-06-26 LAB — BRAIN NATRIURETIC PEPTIDE: Brain Natriuretic Peptide: 713.5 pg/mL — ABNORMAL HIGH (ref <100)

## 2016-06-27 ENCOUNTER — Other Ambulatory Visit: Payer: Self-pay | Admitting: Family Medicine

## 2016-06-27 DIAGNOSIS — R7989 Other specified abnormal findings of blood chemistry: Secondary | ICD-10-CM

## 2016-06-27 DIAGNOSIS — I499 Cardiac arrhythmia, unspecified: Secondary | ICD-10-CM

## 2016-06-27 DIAGNOSIS — I160 Hypertensive urgency: Secondary | ICD-10-CM

## 2016-06-27 DIAGNOSIS — I10 Essential (primary) hypertension: Secondary | ICD-10-CM

## 2016-06-27 DIAGNOSIS — N390 Urinary tract infection, site not specified: Secondary | ICD-10-CM

## 2016-06-27 DIAGNOSIS — E559 Vitamin D deficiency, unspecified: Secondary | ICD-10-CM

## 2016-06-27 LAB — URINE CULTURE: Colony Count: >=100000

## 2016-06-27 MED ORDER — SULFAMETHOXAZOLE-TRIMETHOPRIM 800-160 MG PO TABS: 1.0000 | ORAL_TABLET | Freq: Two times a day (BID) | ORAL | 0 refills | Status: DC

## 2016-06-27 MED ORDER — ERGOCALCIFEROL 1.25 MG (50000 UT) PO CAPS: 50000.0000 [IU] | ORAL_CAPSULE | ORAL | 1 refills | Status: DC

## 2016-06-27 NOTE — Progress Notes (Signed)
Called and left message. Advised of vitamin D deficiency and to start supplement as directed. Advised that rx for vitamin D was sent into [facility]. Advised that we are sending her to Cardiology for her hypertension and she should be receiving a call from there office to schedule an appointment. Asked patient to keep follow up appointment scheduled with our office.  Advised if any questions to call back our office and left call back number.

## 2016-06-27 NOTE — Progress Notes (Signed)
Meds ordered this encounter  Medications  . ergocalciferol (DRISDOL) 50000 units capsule    Sig: Take 1 capsule (50,000 Units total) by mouth once a week.    Dispense:  12 capsule    Refill:  1

## 2016-06-28 ENCOUNTER — Telehealth (HOSPITAL_COMMUNITY): Payer: Self-pay | Admitting: Hematology

## 2016-06-28 NOTE — Telephone Encounter (Signed)
Called patient back in regards to her voicemail about not receiving her medications.  Per her mail order pharmacy, gabapentin for nerve pain and buspar for anxiety was sent to patient.  Patient put daugher Mya on the phone.  According to Mya, buspar is not in the medications that were received.  I advised her to contact the pharmacy for that.  Daughter verbalizes understanding.

## 2016-07-02 LAB — COCAINE METABOLITE (GC/MS), U
BENZOYLECGONINE GC/MS CONF: 36480 ng/mL
COCAINE (METAB.), URINE: POSITIVE — AB

## 2016-07-02 LAB — DRUG SCREEN (9) + CREATININE, UR
AMPHETAMINE SCREEN, URINE: NEGATIVE ng/mL
BARBITURATE QUANT UR: NEGATIVE ng/mL
BENZODIAZ UR QL: NEGATIVE ng/mL
CREATININE, RANDOM U: 193.7 mg/dL (ref 20.0–300.0)
Methadone Screen, Urine: NEGATIVE ng/mL
OPIATE SCREEN URINE: NEGATIVE ng/mL
Oxycodone+Oxymorphone Ur Ql Scn: NEGATIVE ng/mL
PCP, URINE: NEGATIVE ng/mL
PROPOXYPHENE: NEGATIVE ng/mL
pH, Urine: 5.7 (ref 4.5–8.9)

## 2016-07-05 ENCOUNTER — Other Ambulatory Visit: Payer: Self-pay

## 2016-07-05 DIAGNOSIS — F17200 Nicotine dependence, unspecified, uncomplicated: Secondary | ICD-10-CM

## 2016-07-05 MED ORDER — ALBUTEROL SULFATE HFA 108 (90 BASE) MCG/ACT IN AERS: 1.0000 | INHALATION_SPRAY | Freq: Four times a day (QID) | RESPIRATORY_TRACT | 3 refills | Status: DC | PRN

## 2016-07-05 MED ORDER — NICOTINE 14 MG/24HR TD PT24: 14.0000 mg | MEDICATED_PATCH | Freq: Every day | TRANSDERMAL | 0 refills | Status: DC

## 2016-07-12 ENCOUNTER — Emergency Department (HOSPITAL_COMMUNITY)
Admission: EM | Admit: 2016-07-12 | Discharge: 2016-07-12 | Disposition: A | Discharge status: Home / Self Care | Attending: Emergency Medicine | Admitting: Emergency Medicine

## 2016-07-12 ENCOUNTER — Encounter (HOSPITAL_COMMUNITY): Payer: Self-pay | Admitting: *Deleted

## 2016-07-12 DIAGNOSIS — F17210 Nicotine dependence, cigarettes, uncomplicated: Secondary | ICD-10-CM | POA: Diagnosis not present

## 2016-07-12 DIAGNOSIS — Z853 Personal history of malignant neoplasm of breast: Secondary | ICD-10-CM | POA: Diagnosis not present

## 2016-07-12 DIAGNOSIS — I10 Essential (primary) hypertension: Secondary | ICD-10-CM | POA: Insufficient documentation

## 2016-07-12 DIAGNOSIS — Z8551 Personal history of malignant neoplasm of bladder: Secondary | ICD-10-CM | POA: Insufficient documentation

## 2016-07-12 DIAGNOSIS — T783XXA Angioneurotic edema, initial encounter: Secondary | ICD-10-CM | POA: Insufficient documentation

## 2016-07-12 DIAGNOSIS — Z79899 Other long term (current) drug therapy: Secondary | ICD-10-CM | POA: Diagnosis not present

## 2016-07-12 DIAGNOSIS — J449 Chronic obstructive pulmonary disease, unspecified: Secondary | ICD-10-CM | POA: Diagnosis not present

## 2016-07-12 MED ORDER — HYDRALAZINE HCL 10 MG PO TABS: 10.0000 mg | ORAL_TABLET | Freq: Three times a day (TID) | ORAL | 1 refills | Status: DC

## 2016-07-12 MED ORDER — FAMOTIDINE IN NACL 20-0.9 MG/50ML-% IV SOLN
20.0000 mg | Freq: Once | INTRAVENOUS | Status: AC
  Administered 2016-07-12: 20 mg via INTRAVENOUS
  Filled 2016-07-12: qty 50

## 2016-07-12 MED ORDER — HYDROCHLOROTHIAZIDE 12.5 MG PO TABS: 12.5000 mg | ORAL_TABLET | Freq: Every day | ORAL | 1 refills | Status: DC

## 2016-07-12 MED ORDER — AMLODIPINE BESYLATE 10 MG PO TABS: 10.0000 mg | ORAL_TABLET | Freq: Every day | ORAL | 1 refills | Status: DC

## 2016-07-12 MED ORDER — DIPHENHYDRAMINE HCL 50 MG/ML IJ SOLN
25.0000 mg | Freq: Once | INTRAMUSCULAR | Status: AC
  Administered 2016-07-12: 25 mg via INTRAVENOUS
  Filled 2016-07-12: qty 1

## 2016-07-12 MED ORDER — METHYLPREDNISOLONE SODIUM SUCC 125 MG IJ SOLR
125.0000 mg | Freq: Once | INTRAMUSCULAR | Status: AC
  Administered 2016-07-12: 125 mg via INTRAVENOUS
  Filled 2016-07-12: qty 2

## 2016-07-12 NOTE — ED Triage Notes (Signed)
Pt woke with swollen tongue this am. States she is on Lisinopril and has been for many yrs. No meds taken this am. Noted moderate swelling to tongue... Unable to see back of throat with out manipulation of tongue. resp e/u. Garble sound when speaking. Skin w/d

## 2016-07-12 NOTE — ED Notes (Signed)
Dr Johnney Killian in for visual eval of airway

## 2016-07-12 NOTE — Discharge Instructions (Signed)
You cannot take lisinopril or any other medication considered an ace inhibitor for blood pressure control. Leg her doctor know you're seen in the emergency department for this condition.  Your Norvasc has been refilled, you are to restart that medication. You have been given hydralazine to take as a second medication since you can no longer take the lisinopril. Hydrochlorothiazide (HCTZ) has also been refilled since it was part of your lisinopril tablet. This medication is safe to continue.
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
## 2016-07-24 ENCOUNTER — Inpatient Hospital Stay (HOSPITAL_COMMUNITY)
Admission: EM | Admit: 2016-07-24 | Discharge: 2016-07-27 | DRG: 871 | Disposition: A | Discharge status: Home / Self Care | Attending: Internal Medicine | Admitting: Internal Medicine

## 2016-07-24 ENCOUNTER — Other Ambulatory Visit: Payer: Self-pay

## 2016-07-24 ENCOUNTER — Emergency Department (HOSPITAL_COMMUNITY)

## 2016-07-24 ENCOUNTER — Encounter (HOSPITAL_COMMUNITY): Payer: Self-pay | Admitting: Emergency Medicine

## 2016-07-24 DIAGNOSIS — I471 Supraventricular tachycardia: Secondary | ICD-10-CM | POA: Diagnosis present

## 2016-07-24 DIAGNOSIS — E785 Hyperlipidemia, unspecified: Secondary | ICD-10-CM | POA: Diagnosis not present

## 2016-07-24 DIAGNOSIS — Z79899 Other long term (current) drug therapy: Secondary | ICD-10-CM | POA: Diagnosis not present

## 2016-07-24 DIAGNOSIS — A419 Sepsis, unspecified organism: Principal | ICD-10-CM | POA: Diagnosis present

## 2016-07-24 DIAGNOSIS — Z170 Estrogen receptor positive status [ER+]: Secondary | ICD-10-CM

## 2016-07-24 DIAGNOSIS — F411 Generalized anxiety disorder: Secondary | ICD-10-CM | POA: Diagnosis present

## 2016-07-24 DIAGNOSIS — J989 Respiratory disorder, unspecified: Secondary | ICD-10-CM

## 2016-07-24 DIAGNOSIS — J9601 Acute respiratory failure with hypoxia: Secondary | ICD-10-CM

## 2016-07-24 DIAGNOSIS — K219 Gastro-esophageal reflux disease without esophagitis: Secondary | ICD-10-CM | POA: Diagnosis present

## 2016-07-24 DIAGNOSIS — J441 Chronic obstructive pulmonary disease with (acute) exacerbation: Secondary | ICD-10-CM | POA: Diagnosis not present

## 2016-07-24 DIAGNOSIS — F17210 Nicotine dependence, cigarettes, uncomplicated: Secondary | ICD-10-CM | POA: Diagnosis present

## 2016-07-24 DIAGNOSIS — Z79811 Long term (current) use of aromatase inhibitors: Secondary | ICD-10-CM

## 2016-07-24 DIAGNOSIS — J101 Influenza due to other identified influenza virus with other respiratory manifestations: Secondary | ICD-10-CM | POA: Diagnosis not present

## 2016-07-24 DIAGNOSIS — R6889 Other general symptoms and signs: Secondary | ICD-10-CM

## 2016-07-24 DIAGNOSIS — R0602 Shortness of breath: Secondary | ICD-10-CM

## 2016-07-24 DIAGNOSIS — R3129 Other microscopic hematuria: Secondary | ICD-10-CM | POA: Diagnosis present

## 2016-07-24 DIAGNOSIS — I160 Hypertensive urgency: Secondary | ICD-10-CM

## 2016-07-24 DIAGNOSIS — I10 Essential (primary) hypertension: Secondary | ICD-10-CM | POA: Diagnosis not present

## 2016-07-24 DIAGNOSIS — E860 Dehydration: Secondary | ICD-10-CM | POA: Diagnosis not present

## 2016-07-24 DIAGNOSIS — F32A Depression, unspecified: Secondary | ICD-10-CM | POA: Diagnosis present

## 2016-07-24 DIAGNOSIS — I110 Hypertensive heart disease with heart failure: Secondary | ICD-10-CM | POA: Diagnosis present

## 2016-07-24 DIAGNOSIS — C50912 Malignant neoplasm of unspecified site of left female breast: Secondary | ICD-10-CM | POA: Diagnosis not present

## 2016-07-24 DIAGNOSIS — Z923 Personal history of irradiation: Secondary | ICD-10-CM | POA: Diagnosis not present

## 2016-07-24 DIAGNOSIS — R809 Proteinuria, unspecified: Secondary | ICD-10-CM | POA: Diagnosis not present

## 2016-07-24 DIAGNOSIS — F329 Major depressive disorder, single episode, unspecified: Secondary | ICD-10-CM | POA: Diagnosis present

## 2016-07-24 DIAGNOSIS — I509 Heart failure, unspecified: Secondary | ICD-10-CM

## 2016-07-24 LAB — CBC WITH DIFFERENTIAL/PLATELET
BASOS PCT: 0 %
Basophils Absolute: 0 10*3/uL (ref 0.0–0.1)
Eosinophils Absolute: 0.1 10*3/uL (ref 0.0–0.7)
Eosinophils Relative: 1 %
HCT: 41.2 % (ref 36.0–46.0)
HEMOGLOBIN: 13.9 g/dL (ref 12.0–15.0)
LYMPHS ABS: 0.9 10*3/uL (ref 0.7–4.0)
Lymphocytes Relative: 9 %
MCH: 29.2 pg (ref 26.0–34.0)
MCHC: 33.7 g/dL (ref 30.0–36.0)
MCV: 86.6 fL (ref 78.0–100.0)
MONO ABS: 0.3 10*3/uL (ref 0.1–1.0)
MONOS PCT: 3 %
Neutro Abs: 8.7 10*3/uL — ABNORMAL HIGH (ref 1.7–7.7)
Neutrophils Relative %: 87 %
Platelets: 388 10*3/uL (ref 150–400)
RBC: 4.76 MIL/uL (ref 3.87–5.11)
RDW: 14.2 % (ref 11.5–15.5)
WBC: 10 10*3/uL (ref 4.0–10.5)

## 2016-07-24 LAB — BASIC METABOLIC PANEL
Anion gap: 10 (ref 5–15)
BUN: 14 mg/dL (ref 6–20)
CALCIUM: 9.1 mg/dL (ref 8.9–10.3)
CHLORIDE: 104 mmol/L (ref 101–111)
CO2: 22 mmol/L (ref 22–32)
CREATININE: 0.78 mg/dL (ref 0.44–1.00)
GFR calc non Af Amer: >60 mL/min (ref >60)
Glucose, Bld: 143 mg/dL — ABNORMAL HIGH (ref 65–99)
Potassium: 3.6 mmol/L (ref 3.5–5.1)
Sodium: 136 mmol/L (ref 135–145)

## 2016-07-24 LAB — I-STAT CG4 LACTIC ACID, ED: Lactic Acid, Venous: 2.35 mmol/L (ref 0.5–1.9)

## 2016-07-24 LAB — URINALYSIS, ROUTINE W REFLEX MICROSCOPIC
BILIRUBIN URINE: NEGATIVE
Bacteria, UA: NONE SEEN
Glucose, UA: NEGATIVE mg/dL
KETONES UR: NEGATIVE mg/dL
LEUKOCYTES UA: NEGATIVE
Nitrite: NEGATIVE
Specific Gravity, Urine: 1.013 (ref 1.005–1.030)
pH: 5 (ref 5.0–8.0)

## 2016-07-24 LAB — I-STAT TROPONIN, ED: Troponin i, poc: 0.03 ng/mL (ref 0.00–0.08)

## 2016-07-24 LAB — INFLUENZA PANEL BY PCR (TYPE A & B)
INFLBPCR: NEGATIVE
Influenza A By PCR: POSITIVE — AB

## 2016-07-24 LAB — APTT: APTT: 49 s — AB (ref 24–36)

## 2016-07-24 LAB — PROTIME-INR
INR: 1.02
PROTHROMBIN TIME: 13.4 s (ref 11.4–15.2)

## 2016-07-24 LAB — BRAIN NATRIURETIC PEPTIDE: B Natriuretic Peptide: 523.5 pg/mL — ABNORMAL HIGH (ref 0.0–100.0)

## 2016-07-24 LAB — LACTIC ACID, PLASMA: LACTIC ACID, VENOUS: 1.4 mmol/L (ref 0.5–1.9)

## 2016-07-24 MED ORDER — ACETAMINOPHEN 650 MG RE SUPP: 650.0000 mg | Freq: Four times a day (QID) | RECTAL | Status: DC | PRN

## 2016-07-24 MED ORDER — METHYLPREDNISOLONE SODIUM SUCC 125 MG IJ SOLR: 125.0000 mg | Freq: Once | INTRAMUSCULAR | Status: DC

## 2016-07-24 MED ORDER — METHYLPREDNISOLONE SODIUM SUCC 125 MG IJ SOLR
125.0000 mg | Freq: Once | INTRAMUSCULAR | Status: AC
  Administered 2016-07-25: 125 mg via INTRAVENOUS
  Filled 2016-07-24: qty 2

## 2016-07-24 MED ORDER — LEVOFLOXACIN IN D5W 750 MG/150ML IV SOLN
750.0000 mg | Freq: Every day | INTRAVENOUS | Status: DC
  Administered 2016-07-25 (×2): 750 mg via INTRAVENOUS
  Filled 2016-07-24 (×2): qty 150

## 2016-07-24 MED ORDER — GABAPENTIN 300 MG PO CAPS
300.0000 mg | ORAL_CAPSULE | Freq: Three times a day (TID) | ORAL | Status: DC
  Administered 2016-07-25 – 2016-07-27 (×8): 300 mg via ORAL
  Filled 2016-07-24 (×8): qty 1

## 2016-07-24 MED ORDER — ONDANSETRON HCL 4 MG/2ML IJ SOLN: 4.0000 mg | Freq: Four times a day (QID) | INTRAMUSCULAR | Status: DC | PRN

## 2016-07-24 MED ORDER — BUSPIRONE HCL 5 MG PO TABS
10.0000 mg | ORAL_TABLET | Freq: Two times a day (BID) | ORAL | Status: DC
  Administered 2016-07-25 – 2016-07-27 (×6): 10 mg via ORAL
  Filled 2016-07-24 (×5): qty 2
  Filled 2016-07-24 (×2): qty 1

## 2016-07-24 MED ORDER — NITROGLYCERIN 0.4 MG SL SUBL
0.4000 mg | SUBLINGUAL_TABLET | Freq: Once | SUBLINGUAL | Status: AC
  Administered 2016-07-24: 0.4 mg via SUBLINGUAL
  Filled 2016-07-24: qty 1

## 2016-07-24 MED ORDER — IPRATROPIUM-ALBUTEROL 0.5-2.5 (3) MG/3ML IN SOLN: 3.0000 mL | RESPIRATORY_TRACT | Status: DC | PRN

## 2016-07-24 MED ORDER — ONDANSETRON HCL 4 MG PO TABS: 4.0000 mg | ORAL_TABLET | Freq: Four times a day (QID) | ORAL | Status: DC | PRN

## 2016-07-24 MED ORDER — VANCOMYCIN HCL 500 MG IV SOLR: 500.0000 mg | Freq: Two times a day (BID) | INTRAVENOUS | Status: DC

## 2016-07-24 MED ORDER — SODIUM CHLORIDE 0.9 % IV BOLUS (SEPSIS)
1000.0000 mL | INTRAVENOUS | Status: AC
  Administered 2016-07-24: 1000 mL via INTRAVENOUS

## 2016-07-24 MED ORDER — HYDRALAZINE HCL 10 MG PO TABS
10.0000 mg | ORAL_TABLET | Freq: Three times a day (TID) | ORAL | Status: DC
  Administered 2016-07-25 – 2016-07-27 (×7): 10 mg via ORAL
  Filled 2016-07-24 (×9): qty 1

## 2016-07-24 MED ORDER — ACETAMINOPHEN 325 MG PO TABS
650.0000 mg | ORAL_TABLET | Freq: Once | ORAL | Status: AC
  Administered 2016-07-24: 650 mg via ORAL
  Filled 2016-07-24: qty 2

## 2016-07-24 MED ORDER — OSELTAMIVIR PHOSPHATE 75 MG PO CAPS
75.0000 mg | ORAL_CAPSULE | Freq: Once | ORAL | Status: AC
  Administered 2016-07-24: 75 mg via ORAL
  Filled 2016-07-24: qty 1

## 2016-07-24 MED ORDER — AMLODIPINE BESYLATE 10 MG PO TABS
10.0000 mg | ORAL_TABLET | Freq: Every day | ORAL | Status: DC
  Administered 2016-07-25 – 2016-07-27 (×3): 10 mg via ORAL
  Filled 2016-07-24: qty 2
  Filled 2016-07-24 (×2): qty 1

## 2016-07-24 MED ORDER — ANASTROZOLE 1 MG PO TABS
1.0000 mg | ORAL_TABLET | Freq: Every day | ORAL | Status: DC
  Administered 2016-07-25 – 2016-07-27 (×3): 1 mg via ORAL
  Filled 2016-07-24 (×3): qty 1

## 2016-07-24 MED ORDER — POLYETHYLENE GLYCOL 3350 17 G PO PACK: 17.0000 g | PACK | Freq: Every day | ORAL | Status: DC | PRN

## 2016-07-24 MED ORDER — OSELTAMIVIR PHOSPHATE 75 MG PO CAPS
75.0000 mg | ORAL_CAPSULE | Freq: Two times a day (BID) | ORAL | Status: DC
  Administered 2016-07-25 (×2): 75 mg via ORAL
  Filled 2016-07-24 (×3): qty 1

## 2016-07-24 MED ORDER — SODIUM CHLORIDE 0.9 % IV SOLN: 250.0000 mL | INTRAVENOUS | Status: DC | PRN

## 2016-07-24 MED ORDER — VANCOMYCIN HCL IN DEXTROSE 1-5 GM/200ML-% IV SOLN
1000.0000 mg | Freq: Once | INTRAVENOUS | Status: AC
  Administered 2016-07-24: 1000 mg via INTRAVENOUS
  Filled 2016-07-24: qty 200

## 2016-07-24 MED ORDER — IPRATROPIUM-ALBUTEROL 0.5-2.5 (3) MG/3ML IN SOLN
3.0000 mL | Freq: Once | RESPIRATORY_TRACT | Status: AC
  Administered 2016-07-24: 3 mL via RESPIRATORY_TRACT
  Filled 2016-07-24: qty 3

## 2016-07-24 MED ORDER — HYDROCODONE-HOMATROPINE 5-1.5 MG/5ML PO SYRP
5.0000 mL | ORAL_SOLUTION | Freq: Once | ORAL | Status: AC
  Administered 2016-07-24: 5 mL via ORAL
  Filled 2016-07-24: qty 5

## 2016-07-24 MED ORDER — BISACODYL 5 MG PO TBEC
5.0000 mg | DELAYED_RELEASE_TABLET | Freq: Every day | ORAL | Status: DC | PRN
  Filled 2016-07-24: qty 1

## 2016-07-24 MED ORDER — HYDRALAZINE HCL 20 MG/ML IJ SOLN
10.0000 mg | INTRAMUSCULAR | Status: DC | PRN
  Administered 2016-07-25 (×2): 10 mg via INTRAVENOUS
  Filled 2016-07-24 (×2): qty 1

## 2016-07-24 MED ORDER — PIPERACILLIN-TAZOBACTAM 3.375 G IVPB 30 MIN
3.3750 g | Freq: Once | INTRAVENOUS | Status: AC
  Administered 2016-07-24: 3.375 g via INTRAVENOUS
  Filled 2016-07-24: qty 50

## 2016-07-24 MED ORDER — SODIUM CHLORIDE 0.9% FLUSH
3.0000 mL | Freq: Two times a day (BID) | INTRAVENOUS | Status: DC
  Administered 2016-07-25 – 2016-07-27 (×4): 3 mL via INTRAVENOUS

## 2016-07-24 MED ORDER — IPRATROPIUM-ALBUTEROL 0.5-2.5 (3) MG/3ML IN SOLN
3.0000 mL | Freq: Three times a day (TID) | RESPIRATORY_TRACT | Status: DC
  Administered 2016-07-25 – 2016-07-27 (×7): 3 mL via RESPIRATORY_TRACT
  Filled 2016-07-24 (×7): qty 3

## 2016-07-24 MED ORDER — PIPERACILLIN-TAZOBACTAM 3.375 G IVPB: 3.3750 g | Freq: Three times a day (TID) | INTRAVENOUS | Status: DC

## 2016-07-24 MED ORDER — METHYLPREDNISOLONE SODIUM SUCC 125 MG IJ SOLR
60.0000 mg | Freq: Four times a day (QID) | INTRAMUSCULAR | Status: DC
  Administered 2016-07-25 – 2016-07-27 (×10): 60 mg via INTRAVENOUS
  Filled 2016-07-24 (×10): qty 2

## 2016-07-24 MED ORDER — SODIUM CHLORIDE 0.9% FLUSH: 3.0000 mL | INTRAVENOUS | Status: DC | PRN

## 2016-07-24 MED ORDER — SODIUM CHLORIDE 0.9% FLUSH
3.0000 mL | Freq: Two times a day (BID) | INTRAVENOUS | Status: DC
  Administered 2016-07-25 – 2016-07-26 (×4): 3 mL via INTRAVENOUS

## 2016-07-24 MED ORDER — ENOXAPARIN SODIUM 40 MG/0.4ML ~~LOC~~ SOLN
40.0000 mg | Freq: Every day | SUBCUTANEOUS | Status: DC
  Administered 2016-07-25 – 2016-07-26 (×3): 40 mg via SUBCUTANEOUS
  Filled 2016-07-24 (×3): qty 0.4

## 2016-07-24 MED ORDER — ACETAMINOPHEN 325 MG PO TABS: 650.0000 mg | ORAL_TABLET | Freq: Four times a day (QID) | ORAL | Status: DC | PRN

## 2016-07-24 MED ORDER — HYDROCODONE-ACETAMINOPHEN 5-325 MG PO TABS
1.0000 | ORAL_TABLET | ORAL | Status: DC | PRN
  Administered 2016-07-25: 2 via ORAL
  Administered 2016-07-27: 1 via ORAL
  Filled 2016-07-24: qty 1
  Filled 2016-07-24: qty 2
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
## 2016-07-24 NOTE — ED Notes (Signed)
RN will start a 2nd IV line and draw blood work

## 2016-07-24 NOTE — Progress Notes (Signed)
Pharmacy Antibiotic Note  Victoria Burke is a 66 y.o. female presented to the ED on 07/24/16 with c/o SOB and cough.  To start vancomycin and zosyn for suspected sepsis.   Plan: - zosyn 3.375 gm IV x1 over 30 min, then 3.375 gm q8h (infuse over 4 hrs) - vancomycin 1000 mg IV x1 then 500 mg IV q12h  ______________________________  Height: 5\' 2"  (157.5 cm) Weight: 125 lb (56.7 kg) IBW/kg (Calculated) : 50.1  Temp (24hrs), Avg:102 F (38.9 C), Min:100.4 F (38 C), Max:103.6 F (39.8 C)   Recent Labs Lab 07/24/16 2030  WBC 10.0  CREATININE 0.78    Estimated Creatinine Clearance: 56.2 mL/min (by C-G formula based on SCr of 0.78 mg/dL).    No Known Allergies   Thank you for allowing pharmacy to be a part of this patient's care.  Lynelle Doctor 07/24/2016 9:13 PM

## 2016-07-24 NOTE — ED Notes (Signed)
Bed: [id number] Expected date:  Expected time:  Means of arrival:  Comments: 66 yo F Resp distress

## 2016-07-24 NOTE — ED Triage Notes (Signed)
Pt BIB EMS for SOB that began when pt woke up this morning; pt c/o cough; pt usually has an albuterol puffer but ran out of his medication a few days ago; hx of COPD; per EMS, SpO2 originally 86% on RA; pt got 10 of albuterol and 0.5 atrovent; on arrival SpO2 of 92% on RA

## 2016-07-24 NOTE — ED Notes (Signed)
Pt in X-Ray ?
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
## 2016-07-25 DIAGNOSIS — I160 Hypertensive urgency: Secondary | ICD-10-CM | POA: Diagnosis present

## 2016-07-25 DIAGNOSIS — I471 Supraventricular tachycardia: Secondary | ICD-10-CM | POA: Diagnosis present

## 2016-07-25 DIAGNOSIS — C50912 Malignant neoplasm of unspecified site of left female breast: Secondary | ICD-10-CM | POA: Diagnosis present

## 2016-07-25 DIAGNOSIS — Z170 Estrogen receptor positive status [ER+]: Secondary | ICD-10-CM | POA: Diagnosis not present

## 2016-07-25 DIAGNOSIS — F17210 Nicotine dependence, cigarettes, uncomplicated: Secondary | ICD-10-CM | POA: Diagnosis present

## 2016-07-25 DIAGNOSIS — J441 Chronic obstructive pulmonary disease with (acute) exacerbation: Secondary | ICD-10-CM | POA: Diagnosis present

## 2016-07-25 DIAGNOSIS — Z923 Personal history of irradiation: Secondary | ICD-10-CM | POA: Diagnosis not present

## 2016-07-25 DIAGNOSIS — J9601 Acute respiratory failure with hypoxia: Secondary | ICD-10-CM | POA: Diagnosis present

## 2016-07-25 DIAGNOSIS — A419 Sepsis, unspecified organism: Secondary | ICD-10-CM | POA: Diagnosis present

## 2016-07-25 DIAGNOSIS — J989 Respiratory disorder, unspecified: Secondary | ICD-10-CM | POA: Diagnosis present

## 2016-07-25 DIAGNOSIS — E785 Hyperlipidemia, unspecified: Secondary | ICD-10-CM | POA: Diagnosis present

## 2016-07-25 DIAGNOSIS — Z79811 Long term (current) use of aromatase inhibitors: Secondary | ICD-10-CM | POA: Diagnosis not present

## 2016-07-25 DIAGNOSIS — I10 Essential (primary) hypertension: Secondary | ICD-10-CM | POA: Diagnosis not present

## 2016-07-25 DIAGNOSIS — I509 Heart failure, unspecified: Secondary | ICD-10-CM | POA: Diagnosis not present

## 2016-07-25 DIAGNOSIS — E860 Dehydration: Secondary | ICD-10-CM | POA: Diagnosis present

## 2016-07-25 DIAGNOSIS — I110 Hypertensive heart disease with heart failure: Secondary | ICD-10-CM | POA: Diagnosis present

## 2016-07-25 DIAGNOSIS — J101 Influenza due to other identified influenza virus with other respiratory manifestations: Secondary | ICD-10-CM | POA: Diagnosis present

## 2016-07-25 DIAGNOSIS — R809 Proteinuria, unspecified: Secondary | ICD-10-CM | POA: Diagnosis present

## 2016-07-25 DIAGNOSIS — F411 Generalized anxiety disorder: Secondary | ICD-10-CM | POA: Diagnosis present

## 2016-07-25 DIAGNOSIS — F329 Major depressive disorder, single episode, unspecified: Secondary | ICD-10-CM | POA: Diagnosis not present

## 2016-07-25 DIAGNOSIS — K219 Gastro-esophageal reflux disease without esophagitis: Secondary | ICD-10-CM | POA: Diagnosis present

## 2016-07-25 DIAGNOSIS — Z79899 Other long term (current) drug therapy: Secondary | ICD-10-CM | POA: Diagnosis not present

## 2016-07-25 DIAGNOSIS — R3129 Other microscopic hematuria: Secondary | ICD-10-CM | POA: Diagnosis present

## 2016-07-25 DIAGNOSIS — R6889 Other general symptoms and signs: Secondary | ICD-10-CM | POA: Diagnosis not present

## 2016-07-25 LAB — RAPID URINE DRUG SCREEN, HOSP PERFORMED
Amphetamines: NOT DETECTED
BARBITURATES: NOT DETECTED
Benzodiazepines: NOT DETECTED
Cocaine: POSITIVE — AB
OPIATES: NOT DETECTED
TETRAHYDROCANNABINOL: NOT DETECTED

## 2016-07-25 LAB — BLOOD CULTURE ID PANEL (REFLEXED)
ACINETOBACTER BAUMANNII: NOT DETECTED
CANDIDA ALBICANS: NOT DETECTED
Candida glabrata: NOT DETECTED
Candida krusei: NOT DETECTED
Candida parapsilosis: NOT DETECTED
Candida tropicalis: NOT DETECTED
ENTEROBACTERIACEAE SPECIES: NOT DETECTED
ENTEROCOCCUS SPECIES: NOT DETECTED
Enterobacter cloacae complex: NOT DETECTED
Escherichia coli: NOT DETECTED
HAEMOPHILUS INFLUENZAE: NOT DETECTED
Klebsiella oxytoca: NOT DETECTED
Klebsiella pneumoniae: NOT DETECTED
LISTERIA MONOCYTOGENES: NOT DETECTED
NEISSERIA MENINGITIDIS: NOT DETECTED
Proteus species: NOT DETECTED
Pseudomonas aeruginosa: NOT DETECTED
STREPTOCOCCUS AGALACTIAE: NOT DETECTED
STREPTOCOCCUS PYOGENES: NOT DETECTED
STREPTOCOCCUS SPECIES: DETECTED — AB
Serratia marcescens: NOT DETECTED
Staphylococcus aureus (BCID): NOT DETECTED
Staphylococcus species: NOT DETECTED
Streptococcus pneumoniae: DETECTED — AB

## 2016-07-25 LAB — BASIC METABOLIC PANEL
Anion gap: 8 (ref 5–15)
BUN: 12 mg/dL (ref 6–20)
CALCIUM: 8 mg/dL — AB (ref 8.9–10.3)
CHLORIDE: 106 mmol/L (ref 101–111)
CO2: 21 mmol/L — ABNORMAL LOW (ref 22–32)
CREATININE: 0.88 mg/dL (ref 0.44–1.00)
GFR calc Af Amer: >60 mL/min (ref >60)
GFR calc non Af Amer: >60 mL/min (ref >60)
Glucose, Bld: 140 mg/dL — ABNORMAL HIGH (ref 65–99)
Potassium: 3.6 mmol/L (ref 3.5–5.1)
SODIUM: 135 mmol/L (ref 135–145)

## 2016-07-25 LAB — CBG MONITORING, ED: Glucose-Capillary: 171 mg/dL — ABNORMAL HIGH (ref 65–99)

## 2016-07-25 LAB — PROCALCITONIN

## 2016-07-25 MED ORDER — LORAZEPAM 2 MG/ML IJ SOLN
INTRAMUSCULAR | Status: AC
  Filled 2016-07-25: qty 1

## 2016-07-25 MED ORDER — DEXTROSE 5 % IV SOLN
2.0000 g | INTRAVENOUS | Status: DC
  Administered 2016-07-26: 2 g via INTRAVENOUS
  Filled 2016-07-25 (×2): qty 2

## 2016-07-25 MED ORDER — LORAZEPAM 2 MG/ML IJ SOLN
0.5000 mg | Freq: Once | INTRAMUSCULAR | Status: AC
  Administered 2016-07-25: 0.5 mg via INTRAVENOUS

## 2016-07-25 NOTE — Progress Notes (Signed)
ED CM notes pt assessed by Admission RN who indicates pt is seen by [facility] This a personal care services and pt/family responsible for notifying agency of pt admission and d/c to resume services as needed

## 2016-07-25 NOTE — Progress Notes (Signed)
PROGRESS NOTE    Victoria Burke  [id number] DOB: Dec 27, 1950 DOA: 07/24/2016 PCP: Dorena Dew, FNP    Brief Narrative:   66 y.o. female with medical history significant for hypertension, COPD, depression, anxiety, and cancer of the left breast status post excision and radiation, now presenting to the emergency department with dyspnea, cough, and fevers which began yesterday. Patient reports that she had been in her usual state of health until late yesterday when she noted an increase in her chronic cough and increasing dyspnea with minimal activity. She also developed some subjective fevers and had difficulty sleeping due to the cough and fever. She felt worse this morning and continued to worsen over the course of the day, becoming dyspneic while at rest. Her cough has been intermittently productive of thick white and yellow sputum. She denies chest pain or palpitations and denies extremity swelling or tenderness. She endorses occasional orthopnea, but none recently. She reports being exposed to her son's girlfriend with fever and respiratory illness about a week ago. She called EMS out to the house, where she was found to be saturating 86% on room air and she was treated with DuoNeb and supplemental oxygen en route  Upon arrival to the ED, patient is found to be febrile to 39.8 C, requiring 2 L/m supplemental oxygen to maintain saturation of low 90s, hypertensive, and mildly tachycardic. EKG features a sinus tachycardia with rate 104, PVC, and LVH by voltage criteria with repolarization abnormality. Chest x-ray is notable for cardiomegaly and findings suggestive of early CHF. Chemistry panel is unremarkable and CBC is entirely within normal limits. Lactic acid is elevated to a value of 2.35, troponin is within normal limits, and BNP is elevated at 523. Urinalysis is notable for proteinuria and microscopic hematuria. Patient was given 1 L of normal saline, blood cultures were obtained, and she  was started on vancomycin, Zosyn, and Tamiflu. She was treated symptomatically with hydrocodone and acetaminophen. She was swabbed for flu and this is pending. Patient remains hypertensive and in respiratory distress. She will be admitted to the telemetry unit for ongoing evaluation and management of flu-like illness with acute exacerbation and COPD  Assessment & Plan:   Principal Problem:   COPD with acute exacerbation ([place]) Active Problems:   Hypertensive urgency   Depression   Essential hypertension   Generalized anxiety disorder   Acute respiratory failure ([facility])   Flu-like symptoms   1. COPD with exacerbation, acute hypoxic respiratory failure  - Presents with 1 day of fevers with progressive dyspnea, cough, and wheezing  - Pt noted to have 86% O2 sat on rm air on presentation. - Pt is O2 naive - CXR without infiltrate - Suspect acute exacerbation in COPD, precipitated by infection, - Pt is Flu A pos. See below - Given empiric levofloxacin in ED.  - CXR reviewed without evidence of PNA. Hold further abx for the time being - Low thresh hold for follow up CXR - Patient is continued on scheduled duonebs  2. Flu A  - Developed subjective fever with cough and dyspnea with temp  of39.8 C  - Pt noted to be pos for flu A - On tamiflu  3. Hypertension with hypertensive urgency  - Initial BP 210/120, improved with nebs, analgesia, fluids, abx in ED  - Continue Norvasc and hydralazine;  - lisinopril-HCTZ held on admission given the acute illness and concern for precipitating AKI  - BP improved  4. Depression, anxiety  - Appears to be stable on admission  -  Continue Buspar  5. Cocaine abuse, continued -UDS is pos for cocaine - Pt admits to last use 2 days prior to hospital admission - Cessation was done face to face   DVT prophylaxis: Lovenox Code Status: Full Family Communication: Pt in room, family not at bedside Disposition Plan: Uncertain at this time  Consultants:      Procedures:     Antimicrobials: Anti-infectives    Start     Dose/Rate Route Frequency Ordered Stop   07/25/16 1000  oseltamivir (TAMIFLU) capsule 75 mg     75 mg Oral 2 times daily 07/24/16 2323 07/30/16 0959   07/25/16 0900  vancomycin (VANCOCIN) 500 mg in sodium chloride 0.9 % 100 mL IVPB  Status:  Discontinued     500 mg 100 mL/hr over 60 Minutes Intravenous Every 12 hours 07/24/16 2117 07/24/16 2325   07/25/16 0300  piperacillin-tazobactam (ZOSYN) IVPB 3.375 g  Status:  Discontinued     3.375 g 12.5 mL/hr over 240 Minutes Intravenous Every 8 hours 07/24/16 2117 07/24/16 2325   07/25/16 0000  levofloxacin (LEVAQUIN) IVPB 750 mg     750 mg 100 mL/hr over 90 Minutes Intravenous Daily at bedtime 07/24/16 2325     07/24/16 2115  piperacillin-tazobactam (ZOSYN) IVPB 3.375 g     3.375 g 100 mL/hr over 30 Minutes Intravenous  Once 07/24/16 2104 07/24/16 2150   07/24/16 2115  vancomycin (VANCOCIN) IVPB 1000 mg/200 mL premix     1,000 mg 200 mL/hr over 60 Minutes Intravenous  Once 07/24/16 2104 07/24/16 2223   07/24/16 2115  oseltamivir (TAMIFLU) capsule 75 mg     75 mg Oral  Once 07/24/16 2104 07/24/16 2122       Subjective: Reports feeling somewhat better today  Objective: Vitals:   07/25/16 1134 07/25/16 1429 07/25/16 1441 07/25/16 1540  BP:  142/84  (!) 153/93  Pulse:  85  85  Resp:  16  20  Temp: 98.7 F (37.1 C)   99.2 F (37.3 C)  TempSrc: Oral   Oral  SpO2:  98% 98% 97%  Weight:      Height:        Intake/Output Summary (Last 24 hours) at 07/25/16 1658 Last data filed at 07/25/16 1600  Gross per 24 hour  Intake              150 ml  Output                0 ml  Net              150 ml   Filed Weights   07/24/16 2037  Weight: 56.7 kg (125 lb)    Examination:  General exam: Appears calm and comfortable  Respiratory system: Clear to auscultation. Respiratory effort normal. Cardiovascular system: S1 & S2 heard, RRR Gastrointestinal system: Abdomen  is nondistended, soft and nontender. No organomegaly or masses felt. Normal bowel sounds heard. Central nervous system: Alert and oriented. No focal neurological deficits. Extremities: Symmetric 5 x 5 power. Skin: No rashes, lesions Psychiatry: Judgement and insight appear normal. Mood & affect appropriate.   Data Reviewed: I have personally reviewed following labs and imaging studies  CBC:  Recent Labs Lab 07/24/16 2030  WBC 10.0  NEUTROABS 8.7*  HGB 13.9  HCT 41.2  MCV 86.6  PLT 123456   Basic Metabolic Panel:  Recent Labs Lab 07/24/16 2030 07/25/16 0422  NA 136 135  K 3.6 3.6  CL 104 106  CO2 22 21*  GLUCOSE 143* 140*  BUN 14 12  CREATININE 0.78 0.88  CALCIUM 9.1 8.0*   GFR: Estimated Creatinine Clearance: 51.1 mL/min (by C-G formula based on SCr of 0.88 mg/dL). Liver Function Tests: No results for input(s): AST, ALT, ALKPHOS, BILITOT, PROT, ALBUMIN in the last 168 hours. No results for input(s): LIPASE, AMYLASE in the last 168 hours. No results for input(s): AMMONIA in the last 168 hours. Coagulation Profile:  Recent Labs Lab 07/24/16 2330  INR 1.02   Cardiac Enzymes: No results for input(s): CKTOTAL, CKMB, CKMBINDEX, TROPONINI in the last 168 hours. BNP (last 3 results) No results for input(s): PROBNP in the last 8760 hours. HbA1C: No results for input(s): HGBA1C in the last 72 hours. CBG:  Recent Labs Lab 07/25/16 1136  GLUCAP 171*   Lipid Profile: No results for input(s): CHOL, HDL, LDLCALC, TRIG, CHOLHDL, LDLDIRECT in the last 72 hours. Thyroid Function Tests: No results for input(s): TSH, T4TOTAL, FREET4, T3FREE, THYROIDAB in the last 72 hours. Anemia Panel: No results for input(s): VITAMINB12, FOLATE, FERRITIN, TIBC, IRON, RETICCTPCT in the last 72 hours. Sepsis Labs:  Recent Labs Lab 07/24/16 2121 07/24/16 2330  PROCALCITON  --  <0.10  LATICACIDVEN 2.35* 1.4    Recent Results (from the past 240 hour(s))  Blood Culture (routine x 2)      Status: None (Preliminary result)   Collection Time: 07/24/16  9:20 PM  Result Value Ref Range Status   Specimen Description RIGHT ANTECUBITAL  Final   Special Requests BOTTLES DRAWN AEROBIC AND ANAEROBIC 5CC  Final   Culture   Final    NO GROWTH < 12 HOURS Performed at [facility], [place] [address] [place], [place] [address]    Report Status PENDING  Incomplete  Blood Culture (routine x 2)     Status: None (Preliminary result)   Collection Time: 07/24/16  9:22 PM  Result Value Ref Range Status   Specimen Description PORTA CATH  Final   Special Requests BOTTLES DRAWN AEROBIC AND ANAEROBIC 5CC  Final   Culture   Final    NO GROWTH < 12 HOURS Performed at [facility], [place] [address], [place], [place] [address]    Report Status PENDING  Incomplete     Radiology Studies: Dg Chest 2 View  Result Date: 07/24/2016 CLINICAL DATA:  Shortness of breath. EXAM: CHEST  2 VIEW COMPARISON:  09/20/2015. FINDINGS: Cardiomegaly. Interstitial and alveolar prominence consistent with pulmonary edema. Small effusions. Dense aortic calcification. No pneumothorax. No osseous findings. Surgical clips LEFT axilla. IMPRESSION: Cardiomegaly.  Early CHF.Similar appearance to priors. Electronically Signed   By: Staci Righter M.D.   On: 07/24/2016 20:59    Scheduled Meds: . amLODipine  10 mg Oral Daily  . anastrozole  1 mg Oral Daily  . busPIRone  10 mg Oral BID  . enoxaparin (LOVENOX) injection  40 mg Subcutaneous QHS  . gabapentin  300 mg Oral TID  . hydrALAZINE  10 mg Oral TID  . ipratropium-albuterol  3 mL Nebulization TID  . levofloxacin (LEVAQUIN) IV  750 mg Intravenous QHS  . methylPREDNISolone (SOLU-MEDROL) injection  60 mg Intravenous Q6H  . oseltamivir  75 mg Oral BID  . sodium chloride flush  3 mL Intravenous Q12H  . sodium chloride flush  3 mL Intravenous Q12H   Continuous Infusions:   LOS: 0 days   CHIU, Orpah Melter, MD [facility] Pager [phone]  If 7PM-7AM,  please contact night-coverage [email or web address] Password [facility] 07/25/2016, 4:58 PM

## 2016-07-25 NOTE — ED Provider Notes (Signed)
Sepsis - Repeat Assessment  Performed at:    12:51 AM  Vitals     Blood pressure 160/84, pulse 102, temperature 101 F (38.3 C), temperature source Rectal, resp. rate (!) 40, height 5\' 2"  (1.575 m), weight 56.7 kg, SpO2 96 % on 2.5L O2 via [place].  Heart:     Tachycardic  Lungs:    CTA  Capillary Refill:   <2 sec  Peripheral Pulse:   Radial pulse palpable  Skin:     Normal Color      Antonietta Breach, PA-C 07/25/16 [id number]    Gareth Morgan, MD 07/25/16 [id number]

## 2016-07-25 NOTE — Progress Notes (Signed)
PHARMACY - PHYSICIAN COMMUNICATION CRITICAL VALUE ALERT - BLOOD CULTURE IDENTIFICATION (BCID)  Results for orders placed or performed during the hospital encounter of 07/24/16  Blood Culture ID Panel (Reflexed) (Collected: 07/24/2016  9:22 PM)  Result Value Ref Range   Enterococcus species NOT DETECTED NOT DETECTED   Listeria monocytogenes NOT DETECTED NOT DETECTED   Staphylococcus species NOT DETECTED NOT DETECTED   Staphylococcus aureus NOT DETECTED NOT DETECTED   Streptococcus species DETECTED (A) NOT DETECTED   Streptococcus agalactiae NOT DETECTED NOT DETECTED   Streptococcus pneumoniae DETECTED (A) NOT DETECTED   Streptococcus pyogenes NOT DETECTED NOT DETECTED   Acinetobacter baumannii NOT DETECTED NOT DETECTED   Enterobacteriaceae species NOT DETECTED NOT DETECTED   Enterobacter cloacae complex NOT DETECTED NOT DETECTED   Escherichia coli NOT DETECTED NOT DETECTED   Klebsiella oxytoca NOT DETECTED NOT DETECTED   Klebsiella pneumoniae NOT DETECTED NOT DETECTED   Proteus species NOT DETECTED NOT DETECTED   Serratia marcescens NOT DETECTED NOT DETECTED   Haemophilus influenzae NOT DETECTED NOT DETECTED   Neisseria meningitidis NOT DETECTED NOT DETECTED   Pseudomonas aeruginosa NOT DETECTED NOT DETECTED   Candida albicans NOT DETECTED NOT DETECTED   Candida glabrata NOT DETECTED NOT DETECTED   Candida krusei NOT DETECTED NOT DETECTED   Candida parapsilosis NOT DETECTED NOT DETECTED   Candida tropicalis NOT DETECTED NOT DETECTED    Name of physician (or Provider) Contacted: Sofia, K  Changes to prescribed antibiotics required: Change antibiotics to Ceftriaxone 2gm iv q24hr  Nani Skillern Crowford 07/25/2016  10:12 PM

## 2016-07-25 NOTE — ED Notes (Signed)
Pt unable to give urine specimen at this time 

## 2016-07-26 ENCOUNTER — Other Ambulatory Visit: Payer: Self-pay

## 2016-07-26 ENCOUNTER — Inpatient Hospital Stay (HOSPITAL_COMMUNITY)

## 2016-07-26 LAB — BASIC METABOLIC PANEL
Anion gap: 8 (ref 5–15)
BUN: 28 mg/dL — AB (ref 6–20)
CO2: 21 mmol/L — ABNORMAL LOW (ref 22–32)
CREATININE: 1.22 mg/dL — AB (ref 0.44–1.00)
Calcium: 8.3 mg/dL — ABNORMAL LOW (ref 8.9–10.3)
Chloride: 107 mmol/L (ref 101–111)
GFR calc Af Amer: 53 mL/min — ABNORMAL LOW (ref >60)
GFR, EST NON AFRICAN AMERICAN: 46 mL/min — AB (ref >60)
Glucose, Bld: 140 mg/dL — ABNORMAL HIGH (ref 65–99)
Potassium: 3.9 mmol/L (ref 3.5–5.1)
SODIUM: 136 mmol/L (ref 135–145)

## 2016-07-26 LAB — MAGNESIUM: Magnesium: 1.7 mg/dL (ref 1.7–2.4)

## 2016-07-26 LAB — URINE CULTURE: Culture: <10000 — AB

## 2016-07-26 LAB — GLUCOSE, CAPILLARY: GLUCOSE-CAPILLARY: 155 mg/dL — AB (ref 65–99)

## 2016-07-26 MED ORDER — HYDRALAZINE HCL 20 MG/ML IJ SOLN: 10.0000 mg | INTRAMUSCULAR | Status: DC | PRN

## 2016-07-26 MED ORDER — SODIUM CHLORIDE 0.9 % IV SOLN
INTRAVENOUS | Status: DC
  Administered 2016-07-26 – 2016-07-27 (×2): via INTRAVENOUS

## 2016-07-26 MED ORDER — OSELTAMIVIR PHOSPHATE 30 MG PO CAPS
30.0000 mg | ORAL_CAPSULE | Freq: Two times a day (BID) | ORAL | Status: DC
  Administered 2016-07-26 – 2016-07-27 (×3): 30 mg via ORAL
  Filled 2016-07-26 (×3): qty 1

## 2016-07-26 MED ORDER — MAGNESIUM SULFATE 2 GM/50ML IV SOLN
2.0000 g | Freq: Once | INTRAVENOUS | Status: AC
  Administered 2016-07-26: 2 g via INTRAVENOUS
  Filled 2016-07-26: qty 50

## 2016-07-26 NOTE — Progress Notes (Addendum)
PROGRESS NOTE    Victoria Burke  [id number] DOB: 15-Feb-1951 DOA: 07/24/2016 PCP: Dorena Dew, FNP    Brief Narrative:   66 y.o. female with medical history significant for hypertension, COPD, depression, anxiety, and cancer of the left breast status post excision and radiation, now presenting to the emergency department with dyspnea, cough, and fevers which began yesterday. Patient reports that she had been in her usual state of health until late yesterday when she noted an increase in her chronic cough and increasing dyspnea with minimal activity. She also developed some subjective fevers and had difficulty sleeping due to the cough and fever. She felt worse this morning and continued to worsen over the course of the day, becoming dyspneic while at rest. Her cough has been intermittently productive of thick white and yellow sputum. She denies chest pain or palpitations and denies extremity swelling or tenderness. She endorses occasional orthopnea, but none recently. She reports being exposed to her son's girlfriend with fever and respiratory illness about a week ago. She called EMS out to the house, where she was found to be saturating 86% on room air and she was treated with DuoNeb and supplemental oxygen en route  Upon arrival to the ED, patient is found to be febrile to 39.8 C, requiring 2 L/m supplemental oxygen to maintain saturation of low 90s, hypertensive, and mildly tachycardic. EKG features a sinus tachycardia with rate 104, PVC, and LVH by voltage criteria with repolarization abnormality. Chest x-ray is notable for cardiomegaly and findings suggestive of early CHF. Chemistry panel is unremarkable and CBC is entirely within normal limits. Lactic acid is elevated to a value of 2.35, troponin is within normal limits, and BNP is elevated at 523. Urinalysis is notable for proteinuria and microscopic hematuria. Patient was given 1 L of normal saline, blood cultures were obtained, and she  was started on vancomycin, Zosyn, and Tamiflu. She was treated symptomatically with hydrocodone and acetaminophen. She was swabbed for flu and this is pending. Patient remains hypertensive and in respiratory distress. She will be admitted to the telemetry unit for ongoing evaluation and management of flu-like illness with acute exacerbation and COPD  Assessment & Plan:   Principal Problem:   COPD with acute exacerbation ([place]) Active Problems:   Hypertensive urgency   Depression   Essential hypertension   Generalized anxiety disorder   Acute respiratory failure ([facility])   Flu-like symptoms   Acute on chronic congestive heart failure ([place])   1. COPD with exacerbation, acute hypoxic respiratory failure  - Presents with 1 day of fevers with progressive dyspnea, cough, and wheezing  - Pt noted to have 86% O2 sat on rm air on presentation. - Pt is O2 naive - CXR without infiltrate - Suspect acute exacerbation in COPD, precipitated by infection, - Pt is Flu A pos. See below - Patient now continued on rocephin per below  2. Flu A with sepsis present on admission - Developed subjective fever with cough and dyspnea with temp of f39.8 C  - Pt noted to be pos for flu A - Patient continued on tamiflu  3. Hypertension with hypertensive urgency  - Initial BP 210/120, improved with nebs, analgesia, fluids, abx in ED  - Continue Norvasc and hydralazine;  - lisinopril-HCTZ held on admission given the acute illness and concern for precipitating AKI  - BP has improved - Have ordered PRN IV hydralazine for sbp>160  4. Depression, anxiety  - Appears to be stable on admission  - for now,  will continue Buspar  5. Cocaine abuse, continued - UDS is pos for cocaine - Pt admits to last use 2 days prior to hospital admission - Cessation was done face to face   6. Strep pneumo bacteremia - Suspect possible related to secondary bacterial infection - Now on empiric rocephin - follow up on final  culture results and sensitivities  7. Dehydration - Cr up to 1.22 from 0.8 - Suspect related to presenting flu - Continue on basal IVF  DVT prophylaxis: Lovenox Code Status: Full Family Communication: Pt in room, family not at bedside Disposition Plan: Uncertain at this time  Consultants:     Procedures:     Antimicrobials: Anti-infectives    Start     Dose/Rate Route Frequency Ordered Stop   07/26/16 2000  cefTRIAXone (ROCEPHIN) 2 g in dextrose 5 % 50 mL IVPB     2 g 100 mL/hr over 30 Minutes Intravenous Every 24 hours 07/25/16 2233     07/26/16 1000  oseltamivir (TAMIFLU) capsule 30 mg     30 mg Oral 2 times daily 07/26/16 0721 07/30/16 0959   07/25/16 1000  oseltamivir (TAMIFLU) capsule 75 mg  Status:  Discontinued     75 mg Oral 2 times daily 07/24/16 2323 07/26/16 0721   07/25/16 0900  vancomycin (VANCOCIN) 500 mg in sodium chloride 0.9 % 100 mL IVPB  Status:  Discontinued     500 mg 100 mL/hr over 60 Minutes Intravenous Every 12 hours 07/24/16 2117 07/24/16 2325   07/25/16 0300  piperacillin-tazobactam (ZOSYN) IVPB 3.375 g  Status:  Discontinued     3.375 g 12.5 mL/hr over 240 Minutes Intravenous Every 8 hours 07/24/16 2117 07/24/16 2325   07/25/16 0000  levofloxacin (LEVAQUIN) IVPB 750 mg  Status:  Discontinued     750 mg 100 mL/hr over 90 Minutes Intravenous Daily at bedtime 07/24/16 2325 07/25/16 2232   07/24/16 2115  piperacillin-tazobactam (ZOSYN) IVPB 3.375 g     3.375 g 100 mL/hr over 30 Minutes Intravenous  Once 07/24/16 2104 07/24/16 2150   07/24/16 2115  vancomycin (VANCOCIN) IVPB 1000 mg/200 mL premix     1,000 mg 200 mL/hr over 60 Minutes Intravenous  Once 07/24/16 2104 07/24/16 2223   07/24/16 2115  oseltamivir (TAMIFLU) capsule 75 mg     75 mg Oral  Once 07/24/16 2104 07/24/16 2122      Subjective: No complaints this AM  Objective: Vitals:   07/26/16 0604 07/26/16 0811 07/26/16 1327 07/26/16 1433  BP:   (!) 152/84   Pulse:   88   Resp:    16   Temp:   98.1 F (36.7 C)   TempSrc:   Oral   SpO2:  94% 97% 91%  Weight: 52.2 kg (115 lb)     Height:        Intake/Output Summary (Last 24 hours) at 07/26/16 1514 Last data filed at 07/26/16 1400  Gross per 24 hour  Intake            442.5 ml  Output                0 ml  Net            442.5 ml   Filed Weights   07/24/16 2037 07/26/16 0604  Weight: 56.7 kg (125 lb) 52.2 kg (115 lb)    Examination:  General exam: Laying in bed, in nad  Respiratory system: normal resp effort, no audible wheezing Cardiovascular system: regular rate,  s1, s2 Gastrointestinal system: soft, nondistended, pos BS. Central nervous system: CN2-12 grossly intact, sensation intact. Extremities: Perfused, no clubbing Skin: normal skin turgor, no notable skin lesions seen Psychiatry: mood normal// no auditory hallucinations   Data Reviewed: I have personally reviewed following labs and imaging studies  CBC:  Recent Labs Lab 07/24/16 2030  WBC 10.0  NEUTROABS 8.7*  HGB 13.9  HCT 41.2  MCV 86.6  PLT 123456   Basic Metabolic Panel:  Recent Labs Lab 07/24/16 2030 07/25/16 0422 07/26/16 0501  NA 136 135 136  K 3.6 3.6 3.9  CL 104 106 107  CO2 22 21* 21*  GLUCOSE 143* 140* 140*  BUN 14 12 28*  CREATININE 0.78 0.88 1.22*  CALCIUM 9.1 8.0* 8.3*   GFR: Estimated Creatinine Clearance: 36.8 mL/min (by C-G formula based on SCr of 1.22 mg/dL (H)). Liver Function Tests: No results for input(s): AST, ALT, ALKPHOS, BILITOT, PROT, ALBUMIN in the last 168 hours. No results for input(s): LIPASE, AMYLASE in the last 168 hours. No results for input(s): AMMONIA in the last 168 hours. Coagulation Profile:  Recent Labs Lab 07/24/16 2330  INR 1.02   Cardiac Enzymes: No results for input(s): CKTOTAL, CKMB, CKMBINDEX, TROPONINI in the last 168 hours. BNP (last 3 results) No results for input(s): PROBNP in the last 8760 hours. HbA1C: No results for input(s): HGBA1C in the last 72  hours. CBG:  Recent Labs Lab 07/25/16 1136 07/26/16 0732  GLUCAP 171* 155*   Lipid Profile: No results for input(s): CHOL, HDL, LDLCALC, TRIG, CHOLHDL, LDLDIRECT in the last 72 hours. Thyroid Function Tests: No results for input(s): TSH, T4TOTAL, FREET4, T3FREE, THYROIDAB in the last 72 hours. Anemia Panel: No results for input(s): VITAMINB12, FOLATE, FERRITIN, TIBC, IRON, RETICCTPCT in the last 72 hours. Sepsis Labs:  Recent Labs Lab 07/24/16 2121 07/24/16 2330  PROCALCITON  --  <0.10  LATICACIDVEN 2.35* 1.4    Recent Results (from the past 240 hour(s))  Urine culture     Status: Abnormal   Collection Time: 07/24/16  9:00 PM  Result Value Ref Range Status   Specimen Description URINE, CLEAN CATCH  Final   Special Requests NONE  Final   Culture (A)  Final    <10,000 COLONIES/mL INSIGNIFICANT GROWTH Performed at [facility], [address], [place], [place] [address]    Report Status 07/26/2016 FINAL  Final  Blood Culture (routine x 2)     Status: None (Preliminary result)   Collection Time: 07/24/16  9:20 PM  Result Value Ref Range Status   Specimen Description RIGHT ANTECUBITAL  Final   Special Requests BOTTLES DRAWN AEROBIC AND ANAEROBIC 5CC  Final   Culture   Final    NO GROWTH < 12 HOURS Performed at [facility], [place] [address], [place], [place] [address]    Report Status PENDING  Incomplete  Blood Culture (routine x 2)     Status: Abnormal (Preliminary result)   Collection Time: 07/24/16  9:22 PM  Result Value Ref Range Status   Specimen Description PORTA CATH  Final   Special Requests BOTTLES DRAWN AEROBIC AND ANAEROBIC 5CC  Final   Culture  Setup Time   Final    GRAM POSITIVE COCCI IN CHAINS IN PAIRS ANAEROBIC BOTTLE ONLY CRITICAL RESULT CALLED TO, READ BACK BY AND VERIFIED WITHSeleta Rhymes [facility] 2150 07/25/16 A BROWNING    Culture (A)  Final    STREPTOCOCCUS PNEUMONIAE SUSCEPTIBILITIES TO FOLLOW Performed at [facility]  Lab, [address] [place], [place] [address]    Report Status PENDING  Incomplete  Blood Culture ID Panel (Reflexed)     Status: Abnormal   Collection Time: 07/24/16  9:22 PM  Result Value Ref Range Status   Enterococcus species NOT DETECTED NOT DETECTED Final   Listeria monocytogenes NOT DETECTED NOT DETECTED Final   Staphylococcus species NOT DETECTED NOT DETECTED Final   Staphylococcus aureus NOT DETECTED NOT DETECTED Final   Streptococcus species DETECTED (A) NOT DETECTED Final    Comment: CRITICAL RESULT CALLED TO, READ BACK BY AND VERIFIED WITH: E JACKSON PHARMD 2150 07/25/16 A BROWNING    Streptococcus agalactiae NOT DETECTED NOT DETECTED Final   Streptococcus pneumoniae DETECTED (A) NOT DETECTED Final    Comment: CRITICAL RESULT CALLED TO, READ BACK BY AND VERIFIED WITH: E JACKSON PHARMD 2150 07/25/16 A BROWNING    Streptococcus pyogenes NOT DETECTED NOT DETECTED Final   Acinetobacter baumannii NOT DETECTED NOT DETECTED Final   Enterobacteriaceae species NOT DETECTED NOT DETECTED Final   Enterobacter cloacae complex NOT DETECTED NOT DETECTED Final   Escherichia coli NOT DETECTED NOT DETECTED Final   Klebsiella oxytoca NOT DETECTED NOT DETECTED Final   Klebsiella pneumoniae NOT DETECTED NOT DETECTED Final   Proteus species NOT DETECTED NOT DETECTED Final   Serratia marcescens NOT DETECTED NOT DETECTED Final   Haemophilus influenzae NOT DETECTED NOT DETECTED Final   Neisseria meningitidis NOT DETECTED NOT DETECTED Final   Pseudomonas aeruginosa NOT DETECTED NOT DETECTED Final   Candida albicans NOT DETECTED NOT DETECTED Final   Candida glabrata NOT DETECTED NOT DETECTED Final   Candida krusei NOT DETECTED NOT DETECTED Final   Candida parapsilosis NOT DETECTED NOT DETECTED Final   Candida tropicalis NOT DETECTED NOT DETECTED Final    Comment: Performed at [facility], [place] [address], [place], [place] [address]     Radiology Studies: Dg Chest 2 View  Result Date:  07/24/2016 CLINICAL DATA:  Shortness of breath. EXAM: CHEST  2 VIEW COMPARISON:  09/20/2015. FINDINGS: Cardiomegaly. Interstitial and alveolar prominence consistent with pulmonary edema. Small effusions. Dense aortic calcification. No pneumothorax. No osseous findings. Surgical clips LEFT axilla. IMPRESSION: Cardiomegaly.  Early CHF.Similar appearance to priors. Electronically Signed   By: Staci Righter M.D.   On: 07/24/2016 20:59    Scheduled Meds: . amLODipine  10 mg Oral Daily  . anastrozole  1 mg Oral Daily  . busPIRone  10 mg Oral BID  . cefTRIAXone (ROCEPHIN)  IV  2 g Intravenous Q24H  . enoxaparin (LOVENOX) injection  40 mg Subcutaneous QHS  . gabapentin  300 mg Oral TID  . hydrALAZINE  10 mg Oral TID  . ipratropium-albuterol  3 mL Nebulization TID  . methylPREDNISolone (SOLU-MEDROL) injection  60 mg Intravenous Q6H  . oseltamivir  30 mg Oral BID  . sodium chloride flush  3 mL Intravenous Q12H  . sodium chloride flush  3 mL Intravenous Q12H   Continuous Infusions: . sodium chloride 75 mL/hr at 07/26/16 1006     LOS: 1 day   CHIU, Orpah Melter, MD [facility] Pager [phone]  If 7PM-7AM, please contact night-coverage [email or web address] Password [facility] 07/26/2016, 3:14 PM

## 2016-07-27 LAB — BASIC METABOLIC PANEL
Anion gap: 8 (ref 5–15)
BUN: 32 mg/dL — AB (ref 6–20)
CHLORIDE: 108 mmol/L (ref 101–111)
CO2: 19 mmol/L — AB (ref 22–32)
Calcium: 8.4 mg/dL — ABNORMAL LOW (ref 8.9–10.3)
Creatinine, Ser: 1.06 mg/dL — ABNORMAL HIGH (ref 0.44–1.00)
GFR calc Af Amer: >60 mL/min (ref >60)
GFR calc non Af Amer: 54 mL/min — ABNORMAL LOW (ref >60)
Glucose, Bld: 194 mg/dL — ABNORMAL HIGH (ref 65–99)
POTASSIUM: 4.2 mmol/L (ref 3.5–5.1)
SODIUM: 135 mmol/L (ref 135–145)

## 2016-07-27 LAB — CBC
HEMATOCRIT: 37.8 % (ref 36.0–46.0)
Hemoglobin: 12.2 g/dL (ref 12.0–15.0)
MCH: 29 pg (ref 26.0–34.0)
MCHC: 32.3 g/dL (ref 30.0–36.0)
MCV: 90 fL (ref 78.0–100.0)
Platelets: 361 10*3/uL (ref 150–400)
RBC: 4.2 MIL/uL (ref 3.87–5.11)
RDW: 14.7 % (ref 11.5–15.5)
WBC: 13.3 10*3/uL — AB (ref 4.0–10.5)

## 2016-07-27 LAB — CULTURE, BLOOD (ROUTINE X 2)

## 2016-07-27 LAB — GLUCOSE, CAPILLARY: Glucose-Capillary: 130 mg/dL — ABNORMAL HIGH (ref 65–99)

## 2016-07-27 MED ORDER — CEFPODOXIME PROXETIL 100 MG PO TABS: 100.0000 mg | ORAL_TABLET | Freq: Two times a day (BID) | ORAL | 0 refills | Status: DC

## 2016-07-27 MED ORDER — OSELTAMIVIR PHOSPHATE 30 MG PO CAPS: 30.0000 mg | ORAL_CAPSULE | Freq: Two times a day (BID) | ORAL | 0 refills | Status: DC

## 2016-07-27 MED ORDER — SACCHAROMYCES BOULARDII 250 MG PO CAPS: 250.0000 mg | ORAL_CAPSULE | Freq: Two times a day (BID) | ORAL | 0 refills | Status: DC

## 2016-07-27 NOTE — Discharge Summary (Addendum)
Physician Discharge Summary  Victoria Burke [id number] DOB: 05/20/1951 DOA: 07/24/2016  PCP: Dorena Dew, FNP  Admit date: 07/24/2016 Discharge date: 07/27/16  Admitted From: Home Disposition:  home  Recommendations for Outpatient Follow-up:  1. Follow up with PCP in 2-3 weeks   Discharge Condition:improved CODE STATUS:full Diet recommendation: Regular   Brief/Interim Summary: 66 y.o.femalewith medical history significant forhypertension, COPD, depression, anxiety, and cancer of the left breast status post excision and radiation, now presenting to the emergency department with dyspnea, cough, and fevers which began yesterday. Patient reports that she had been in her usual state of health until late yesterday when she noted an increase in her chronic cough and increasing dyspnea with minimal activity. She also developed some subjective fevers and had difficulty sleeping due to the cough and fever. She felt worse this morning and continued to worsen over the course of the day, becoming dyspneic while at rest. Her cough has been intermittently productive of thick white and yellow sputum. She denies chest pain or palpitations and denies extremity swelling or tenderness. She endorses occasional orthopnea, but none recently. She reports being exposed to her son's girlfriend with fever and respiratory illness about a week ago. She called EMS out to the house, where she was found to be saturating 86% on room air and she was treated with DuoNeb and supplemental oxygen en route  Upon arrival to the ED, patient is found to befebrile to 39.8 C, requiring 2 L/m supplemental oxygen to maintain saturation of low 90s, hypertensive, and mildly tachycardic. EKG features a sinus tachycardia with rate 104, PVC, and LVH by voltage criteria with repolarization abnormality. Chest x-ray is notable for cardiomegaly and findings suggestive of early CHF. Chemistry panel is unremarkable and CBC is entirely  within normal limits. Lactic acid is elevated to a value of 2.35, troponin is within normal limits, and BNP is elevated at 523. Urinalysis is notable for proteinuria and microscopic hematuria. Patient was given 1 L of normal saline, blood cultures were obtained, and she was started on vancomycin, Zosyn, and Tamiflu. She was treated symptomatically with hydrocodone and acetaminophen. She was swabbed for flu and this is pending. Patient remains hypertensive and in respiratory distress. She will be admitted to the telemetry unit for ongoing evaluation and management of flu-like illness with acute exacerbation and COPD  1. COPD with exacerbation, acute hypoxic respiratory failure  - Presents with 1 day of fevers with progressive dyspnea, cough, and wheezing  - Pt noted to have 86% O2 sat on rm air on presentation. - Pt is O2 naive - CXR without infiltrate - Suspect acute exacerbation in COPD, precipitated by infection, - Pt is Flu A pos. See below - Patient also found to be bacteremic with strep pneumo. See below.  2. Flu A with sepsis present on admission - Developed subjective fever with cough and dyspnea with temp of f39.8 C  - Pt noted to be pos for flu A - Patient continued on tamiflu  3. Hypertension with hypertensive urgency  - Initial BP 210/120, improved with nebs, analgesia, fluids, abx in ED  - Continue Norvasc and hydralazine;  - lisinopril-HCTZ held on admission given the acute illness and concern for precipitating AKI  - BP has improved - Have ordered PRN IV hydralazine for sbp>160  4. Depression, anxiety  - Appears to be stable on admission  - Patient continued on Buspar  5. Cocaine abuse, continued - UDS is pos for cocaine - Pt admits to last use 2  days prior to hospital admission - Cessation was done face to face   6. Strep pneumo bacteremia - Suspect possible secondary bacterial infection - Pt was continued on empiric rocephin - Strep pneumo found to be  pan-sensitive (sensitive to rocephin, erythromycin, levofloxacin, penicillin) - Will complete 2 weeks course of vantin on discharge - Patient remained afebrile at time of discharge  7. Dehydration - Cr up to 1.22 from 0.8 - Suspect related to presenting flu - Improved with gentle IVF  8. SVT - Electrolytes unremarkable - Unable to give beta blocker secondary to active cocaine abuse - On further questioning, patient reports feeling increased anxiety/jitters with scheduled bronchodilators.  - Suspect tachyarrhythmias related to breathing treatments - Remained stable at time of discharge  Discharge Diagnoses:  Principal Problem:   COPD with acute exacerbation [facility]) Active Problems:   Hypertensive urgency   Depression   Essential hypertension   Generalized anxiety disorder   Acute respiratory failure (HCC)   Flu-like symptoms    Discharge Instructions   Allergies as of 07/27/2016   No Known Allergies     Medication List    STOP taking these medications   ciprofloxacin 500 MG tablet Commonly known as:  CIPRO   hydrochlorothiazide 12.5 MG tablet Commonly known as:  HYDRODIURIL   lisinopril-hydrochlorothiazide 20-12.5 MG tablet Commonly known as:  PRINZIDE,ZESTORETIC   oxyCODONE-acetaminophen 5-325 MG tablet Commonly known as:  PERCOCET/ROXICET   sulfamethoxazole-trimethoprim 800-160 MG tablet Commonly known as:  BACTRIM DS,SEPTRA DS     TAKE these medications   albuterol 108 (90 Base) MCG/ACT inhaler Commonly known as:  PROVENTIL HFA;VENTOLIN HFA Inhale 1-2 puffs into the lungs every 6 (six) hours as needed for wheezing or shortness of breath.   amLODipine 10 MG tablet Commonly known as:  NORVASC Take 1 tablet (10 mg total) by mouth daily. What changed:  Another medication with the same name was removed. Continue taking this medication, and follow the directions you see here.   anastrozole 1 MG tablet Commonly known as:  ARIMIDEX TAKE 1 TABLET BY MOUTH  EVERY DAY   busPIRone 10 MG tablet Commonly known as:  BUSPAR Take 1 tablet (10 mg total) by mouth 2 (two) times daily.   cefpodoxime 100 MG tablet Commonly known as:  VANTIN Take 1 tablet (100 mg total) by mouth 2 (two) times daily.   ergocalciferol 50000 units capsule Commonly known as:  DRISDOL Take 1 capsule (50,000 Units total) by mouth once a week.   gabapentin 300 MG capsule Commonly known as:  NEURONTIN Take 1 capsule (300 mg total) by mouth 3 (three) times daily.   hydrALAZINE 10 MG tablet Commonly known as:  APRESOLINE Take 1 tablet (10 mg total) by mouth 3 (three) times daily.   nicotine 14 mg/24hr patch Commonly known as:  NICODERM CQ Place 1 patch (14 mg total) onto the skin daily.   oseltamivir 30 MG capsule Commonly known as:  TAMIFLU Take 1 capsule (30 mg total) by mouth 2 (two) times daily.   saccharomyces boulardii 250 MG capsule Commonly known as:  FLORASTOR Take 1 capsule (250 mg total) by mouth 2 (two) times daily.      Follow-up Information    Dorena Dew, FNP. Schedule an appointment as soon as possible for a visit in 2 week(s).   Specialty:  Family Medicine Contact information: [address] [place] [address] [phone]          No Known Allergies   Procedures/Studies: Dg Chest  2 View  Result Date: 07/24/2016 CLINICAL DATA:  Shortness of breath. EXAM: CHEST  2 VIEW COMPARISON:  09/20/2015. FINDINGS: Cardiomegaly. Interstitial and alveolar prominence consistent with pulmonary edema. Small effusions. Dense aortic calcification. No pneumothorax. No osseous findings. Surgical clips LEFT axilla. IMPRESSION: Cardiomegaly.  Early CHF.Similar appearance to priors. Electronically Signed   By: Staci Righter M.D.   On: 07/24/2016 20:59   Dg Chest Port 1 View  Result Date: 07/28/2016 CLINICAL DATA:  Initial evaluation for acute cough, shortness of breath. EXAM: PORTABLE CHEST 1 VIEW COMPARISON:  Prior radiograph from  07/26/2016. FINDINGS: Cardiomegaly, stable from previous. Mediastinal silhouette within normal limits. Aortic atherosclerosis noted. Lungs normally inflated. Diffuse pulmonary vascular congestion with indistinctness of the interstitial markings suggesting pulmonary edema. Small bilateral pleural effusions present. More confluent bibasilar opacities favored to reflect edema and/ or atelectasis, although infiltrates not excluded. No pneumothorax. No acute osseous abnormality. Surgical clips overlie the left axilla. IMPRESSION: 1. Cardiomegaly with moderate diffuse pulmonary edema and small bilateral pleural effusions. 2. More confluent patchy bibasilar opacities, favored to reflect atelectasis/edema, although superimposed infiltrates could be considered in the correct clinical setting. 3. Aortic atherosclerosis. Electronically Signed   By: Jeannine Boga M.D.   On: 07/28/2016 21:32   Dg Chest Port 1 View  Result Date: 07/26/2016 CLINICAL DATA:  Shortness of breath, cough EXAM: PORTABLE CHEST 1 VIEW COMPARISON:  07/24/2016 FINDINGS: Bilateral diffuse mild interstitial thickening similar to the prior exam. Trace bilateral pleural effusions. No focal consolidation. No pneumothorax. Stable cardiomegaly. The osseous structures are unremarkable. IMPRESSION: Mild CHF without significant interval change. Electronically Signed   By: Kathreen Devoid   On: 07/26/2016 19:08   Dg Chest Port 1v Same Day  Result Date: 07/30/2016 CLINICAL DATA:  Shortness of breath and weakness. Flu-like symptoms. EXAM: PORTABLE CHEST 1 VIEW COMPARISON:  07/28/2016 and 07/26/2016 FINDINGS: There are patchy parenchymal densities in the mid and lower lungs bilaterally. Cannot exclude small pleural effusions, particularly at the left lung base. Atherosclerotic calcifications at the aortic arch. Heart size is within normal limits. IMPRESSION: Bilateral patchy parenchymal lung densities. Findings are concerning for bilateral pneumonia.  Recommend follow up to ensure resolution. Electronically Signed   By: Markus Daft M.D.   On: 07/30/2016 12:05    Subjective: No complaints. Feeling better today  Discharge Exam: Vitals:   07/27/16 0551 07/27/16 0819  BP: (!) 157/83   Pulse: 98   Resp: 20 15  Temp: 98 F (36.7 C)    Vitals:   07/26/16 1946 07/26/16 2059 07/27/16 0551 07/27/16 0819  BP:  (!) 142/86 (!) 157/83   Pulse:  (!) 53 98   Resp:  20 20 15   Temp:  97.8 F (36.6 C) 98 F (36.7 C)   TempSrc:  Oral Oral   SpO2: 90% 97% 96%   Weight:      Height:        General: Pt is alert, awake, not in acute distress Cardiovascular: RRR, S1/S2 +, no rubs, no gallops Respiratory: CTA bilaterally, no wheezing, no rhonchi Abdominal: Soft, NT, ND, bowel sounds + Extremities: no edema, no cyanosis   The results of significant diagnostics from this hospitalization (including imaging, microbiology, ancillary and laboratory) are listed below for reference.     Microbiology: Recent Results (from the past 240 hour(s))  Urine culture     Status: Abnormal   Collection Time: 07/24/16  9:00 PM  Result Value Ref Range Status   Specimen Description URINE, CLEAN CATCH  Final  Special Requests NONE  Final   Culture (A)  Final    <10,000 COLONIES/mL INSIGNIFICANT GROWTH Performed at [facility], [place] [address], [place], [place] [address]    Report Status 07/26/2016 FINAL  Final  Blood Culture (routine x 2)     Status: None   Collection Time: 07/24/16  9:20 PM  Result Value Ref Range Status   Specimen Description RIGHT ANTECUBITAL  Final   Special Requests BOTTLES DRAWN AEROBIC AND ANAEROBIC 5CC  Final   Culture   Final    NO GROWTH 5 DAYS Performed at [facility], [place] [address], [place], [place] [address]    Report Status 07/30/2016 FINAL  Final  Blood Culture (routine x 2)     Status: Abnormal   Collection Time: 07/24/16  9:22 PM  Result Value Ref Range Status   Specimen Description PORTA CATH  Final    Special Requests BOTTLES DRAWN AEROBIC AND ANAEROBIC 5CC  Final   Culture  Setup Time   Final    GRAM POSITIVE COCCI IN CHAINS IN PAIRS ANAEROBIC BOTTLE ONLY CRITICAL RESULT CALLED TO, READ BACK BY AND VERIFIED WITHSeleta Rhymes [facility] 2150 07/25/16 A BROWNING Performed at [facility], [place] [address], [place], [place] [address]    Culture STREPTOCOCCUS PNEUMONIAE (A)  Final   Report Status 07/27/2016 FINAL  Final   Organism ID, Bacteria STREPTOCOCCUS PNEUMONIAE  Final      Susceptibility   Streptococcus pneumoniae - MIC*    ERYTHROMYCIN <=0.12 SENSITIVE Sensitive     LEVOFLOXACIN 1 SENSITIVE Sensitive     PENICILLIN <=0.06 SENSITIVE Sensitive     CEFTRIAXONE <=0.12 SENSITIVE Sensitive     * STREPTOCOCCUS PNEUMONIAE  Blood Culture ID Panel (Reflexed)     Status: Abnormal   Collection Time: 07/24/16  9:22 PM  Result Value Ref Range Status   Enterococcus species NOT DETECTED NOT DETECTED Final   Listeria monocytogenes NOT DETECTED NOT DETECTED Final   Staphylococcus species NOT DETECTED NOT DETECTED Final   Staphylococcus aureus NOT DETECTED NOT DETECTED Final   Streptococcus species DETECTED (A) NOT DETECTED Final    Comment: CRITICAL RESULT CALLED TO, READ BACK BY AND VERIFIED WITH: E JACKSON PHARMD 2150 07/25/16 A BROWNING    Streptococcus agalactiae NOT DETECTED NOT DETECTED Final   Streptococcus pneumoniae DETECTED (A) NOT DETECTED Final    Comment: CRITICAL RESULT CALLED TO, READ BACK BY AND VERIFIED WITH: E JACKSON PHARMD 2150 07/25/16 A BROWNING    Streptococcus pyogenes NOT DETECTED NOT DETECTED Final   Acinetobacter baumannii NOT DETECTED NOT DETECTED Final   Enterobacteriaceae species NOT DETECTED NOT DETECTED Final   Enterobacter cloacae complex NOT DETECTED NOT DETECTED Final   Escherichia coli NOT DETECTED NOT DETECTED Final   Klebsiella oxytoca NOT DETECTED NOT DETECTED Final   Klebsiella pneumoniae NOT DETECTED NOT DETECTED Final   Proteus species NOT  DETECTED NOT DETECTED Final   Serratia marcescens NOT DETECTED NOT DETECTED Final   Haemophilus influenzae NOT DETECTED NOT DETECTED Final   Neisseria meningitidis NOT DETECTED NOT DETECTED Final   Pseudomonas aeruginosa NOT DETECTED NOT DETECTED Final   Candida albicans NOT DETECTED NOT DETECTED Final   Candida glabrata NOT DETECTED NOT DETECTED Final   Candida krusei NOT DETECTED NOT DETECTED Final   Candida parapsilosis NOT DETECTED NOT DETECTED Final   Candida tropicalis NOT DETECTED NOT DETECTED Final    Comment: Performed at [facility], [address], [place], [place]  [address]  Blood culture (routine x 2)     Status: None (Preliminary result)   Collection Time: 07/28/16  8:53 PM  Result Value Ref Range Status   Specimen Description BLOOD RIGHT ARM  Final   Special Requests BOTTLES DRAWN AEROBIC AND ANAEROBIC 5CC EA  Final   Culture NO GROWTH 2 DAYS  Final   Report Status PENDING  Incomplete  Blood culture (routine x 2)     Status: None (Preliminary result)   Collection Time: 07/28/16  8:59 PM  Result Value Ref Range Status   Specimen Description BLOOD RIGHT WRIST  Final   Special Requests BOTTLES DRAWN AEROBIC AND ANAEROBIC 5CC EA  Final   Culture NO GROWTH 2 DAYS  Final   Report Status PENDING  Incomplete  MRSA PCR Screening     Status: None   Collection Time: 07/29/16  7:01 AM  Result Value Ref Range Status   MRSA by PCR NEGATIVE NEGATIVE Final    Comment:        The GeneXpert MRSA Assay (FDA approved for NASAL specimens only), is one component of a comprehensive MRSA colonization surveillance program. It is not intended to diagnose MRSA infection nor to guide or monitor treatment for MRSA infections.      Labs: BNP (last 3 results)  Recent Labs  06/25/16 1457 07/24/16 2008 07/28/16 2038  BNP 713.5* 523.5* [id number]*   Basic Metabolic Panel:  Recent Labs Lab 07/26/16 0501 07/27/16 0538 07/28/16 2038 07/29/16 0543 07/30/16 0428  NA 136 135  134* 136 135  K 3.9 4.2 3.2* 3.6 3.5  CL 107 108 103 106 103  CO2 21* 19* 21* 22 25  GLUCOSE 140* 194* 119* 109* 99  BUN 28* 32* 19 20 21*  CREATININE 1.22* 1.06* 0.98 0.91 1.09*  CALCIUM 8.3* 8.4* 9.1 8.3* 8.2*  MG 1.7  --  1.5* 2.3  --    Liver Function Tests:  Recent Labs Lab 07/28/16 2038  AST 30  ALT 22  ALKPHOS 77  BILITOT 0.4  PROT 7.5  ALBUMIN 2.3*   No results for input(s): LIPASE, AMYLASE in the last 168 hours. No results for input(s): AMMONIA in the last 168 hours. CBC:  Recent Labs Lab 07/24/16 2030 07/27/16 0538 07/28/16 2038 07/30/16 0428  WBC 10.0 13.3* 9.8 5.6  NEUTROABS 8.7*  --  8.5*  --   HGB 13.9 12.2 13.4 11.9*  HCT 41.2 37.8 40.7 36.8  MCV 86.6 90.0 88.9 88.5  PLT 388 361 363 299   Cardiac Enzymes:  Recent Labs Lab 07/29/16 0001 07/29/16 0543 07/29/16 1240  TROPONINI 0.11* 0.09* 0.06*   BNP: Invalid input(s): POCBNP CBG:  Recent Labs Lab 07/25/16 1136 07/26/16 0732 07/27/16 0718  GLUCAP 171* 155* 130*   D-Dimer No results for input(s): DDIMER in the last 72 hours. Hgb A1c No results for input(s): HGBA1C in the last 72 hours. Lipid Profile No results for input(s): CHOL, HDL, LDLCALC, TRIG, CHOLHDL, LDLDIRECT in the last 72 hours. Thyroid function studies  Recent Labs  07/29/16 0001  TSH 2.167   Anemia work up No results for input(s): VITAMINB12, FOLATE, FERRITIN, TIBC, IRON, RETICCTPCT in the last 72 hours. Urinalysis    Component Value Date/Time   COLORURINE YELLOW 07/24/2016 2100   APPEARANCEUR CLEAR 07/24/2016 2100   LABSPEC 1.013 07/24/2016 2100   PHURINE 5.0 07/24/2016 2100   GLUCOSEU NEGATIVE 07/24/2016 2100   HGBUR SMALL (A) 07/24/2016 2100   BILIRUBINUR NEGATIVE 07/24/2016 2100   KETONESUR NEGATIVE 07/24/2016  2100   PROTEINUR >=300 (A) 07/24/2016 2100   UROBILINOGEN 0.2 06/25/2016 1507   NITRITE NEGATIVE 07/24/2016 2100   LEUKOCYTESUR NEGATIVE 07/24/2016 2100   Sepsis Labs Invalid input(s):  PROCALCITONIN,  WBC,  LACTICIDVEN Microbiology Recent Results (from the past 240 hour(s))  Urine culture     Status: Abnormal   Collection Time: 07/24/16  9:00 PM  Result Value Ref Range Status   Specimen Description URINE, CLEAN CATCH  Final   Special Requests NONE  Final   Culture (A)  Final    <10,000 COLONIES/mL INSIGNIFICANT GROWTH Performed at [facility], [place] [address], [place], [place] [address]    Report Status 07/26/2016 FINAL  Final  Blood Culture (routine x 2)     Status: None   Collection Time: 07/24/16  9:20 PM  Result Value Ref Range Status   Specimen Description RIGHT ANTECUBITAL  Final   Special Requests BOTTLES DRAWN AEROBIC AND ANAEROBIC 5CC  Final   Culture   Final    NO GROWTH 5 DAYS Performed at [facility], [place] [address], [place], [place] [address]    Report Status 07/30/2016 FINAL  Final  Blood Culture (routine x 2)     Status: Abnormal   Collection Time: 07/24/16  9:22 PM  Result Value Ref Range Status   Specimen Description PORTA CATH  Final   Special Requests BOTTLES DRAWN AEROBIC AND ANAEROBIC 5CC  Final   Culture  Setup Time   Final    GRAM POSITIVE COCCI IN CHAINS IN PAIRS ANAEROBIC BOTTLE ONLY CRITICAL RESULT CALLED TO, READ BACK BY AND VERIFIED WITHSeleta Rhymes [facility] 2150 07/25/16 A BROWNING Performed at [facility], [place] [address], [place], [place] [address]    Culture STREPTOCOCCUS PNEUMONIAE (A)  Final   Report Status 07/27/2016 FINAL  Final   Organism ID, Bacteria STREPTOCOCCUS PNEUMONIAE  Final      Susceptibility   Streptococcus pneumoniae - MIC*    ERYTHROMYCIN <=0.12 SENSITIVE Sensitive     LEVOFLOXACIN 1 SENSITIVE Sensitive     PENICILLIN <=0.06 SENSITIVE Sensitive     CEFTRIAXONE <=0.12 SENSITIVE Sensitive     * STREPTOCOCCUS PNEUMONIAE  Blood Culture ID Panel (Reflexed)     Status: Abnormal   Collection Time: 07/24/16  9:22 PM  Result Value Ref Range Status   Enterococcus species NOT DETECTED NOT DETECTED  Final   Listeria monocytogenes NOT DETECTED NOT DETECTED Final   Staphylococcus species NOT DETECTED NOT DETECTED Final   Staphylococcus aureus NOT DETECTED NOT DETECTED Final   Streptococcus species DETECTED (A) NOT DETECTED Final    Comment: CRITICAL RESULT CALLED TO, READ BACK BY AND VERIFIED WITH: E JACKSON PHARMD 2150 07/25/16 A BROWNING    Streptococcus agalactiae NOT DETECTED NOT DETECTED Final   Streptococcus pneumoniae DETECTED (A) NOT DETECTED Final    Comment: CRITICAL RESULT CALLED TO, READ BACK BY AND VERIFIED WITH: E JACKSON PHARMD 2150 07/25/16 A BROWNING    Streptococcus pyogenes NOT DETECTED NOT DETECTED Final   Acinetobacter baumannii NOT DETECTED NOT DETECTED Final   Enterobacteriaceae species NOT DETECTED NOT DETECTED Final   Enterobacter cloacae complex NOT DETECTED NOT DETECTED Final   Escherichia coli NOT DETECTED NOT DETECTED Final   Klebsiella oxytoca NOT DETECTED NOT DETECTED Final   Klebsiella pneumoniae NOT DETECTED NOT DETECTED Final   Proteus species NOT DETECTED NOT DETECTED Final   Serratia marcescens NOT DETECTED NOT DETECTED Final   Haemophilus influenzae NOT DETECTED NOT DETECTED Final  Neisseria meningitidis NOT DETECTED NOT DETECTED Final   Pseudomonas aeruginosa NOT DETECTED NOT DETECTED Final   Candida albicans NOT DETECTED NOT DETECTED Final   Candida glabrata NOT DETECTED NOT DETECTED Final   Candida krusei NOT DETECTED NOT DETECTED Final   Candida parapsilosis NOT DETECTED NOT DETECTED Final   Candida tropicalis NOT DETECTED NOT DETECTED Final    Comment: Performed at [facility], [place] [address] [place], [place] [address]  Blood culture (routine x 2)     Status: None (Preliminary result)   Collection Time: 07/28/16  8:53 PM  Result Value Ref Range Status   Specimen Description BLOOD RIGHT ARM  Final   Special Requests BOTTLES DRAWN AEROBIC AND ANAEROBIC 5CC EA  Final   Culture NO GROWTH 2 DAYS  Final   Report Status PENDING   Incomplete  Blood culture (routine x 2)     Status: None (Preliminary result)   Collection Time: 07/28/16  8:59 PM  Result Value Ref Range Status   Specimen Description BLOOD RIGHT WRIST  Final   Special Requests BOTTLES DRAWN AEROBIC AND ANAEROBIC 5CC EA  Final   Culture NO GROWTH 2 DAYS  Final   Report Status PENDING  Incomplete  MRSA PCR Screening     Status: None   Collection Time: 07/29/16  7:01 AM  Result Value Ref Range Status   MRSA by PCR NEGATIVE NEGATIVE Final    Comment:        The GeneXpert MRSA Assay (FDA approved for NASAL specimens only), is one component of a comprehensive MRSA colonization surveillance program. It is not intended to diagnose MRSA infection nor to guide or monitor treatment for MRSA infections.      SIGNED:   Donne Hazel, MD  [facility] 07/30/2016, 6:31 PM  If 7PM-7AM, please contact night-coverage [email or web address] Password [email or web address]

## 2016-07-28 ENCOUNTER — Encounter (HOSPITAL_COMMUNITY): Payer: Self-pay | Admitting: Emergency Medicine

## 2016-07-28 ENCOUNTER — Inpatient Hospital Stay (HOSPITAL_COMMUNITY)
Admission: EM | Admit: 2016-07-28 | Discharge: 2016-08-02 | DRG: 308 | Disposition: A | Discharge status: Home / Self Care | Attending: Internal Medicine | Admitting: Internal Medicine

## 2016-07-28 ENCOUNTER — Emergency Department (HOSPITAL_COMMUNITY)

## 2016-07-28 DIAGNOSIS — I248 Other forms of acute ischemic heart disease: Secondary | ICD-10-CM | POA: Diagnosis not present

## 2016-07-28 DIAGNOSIS — C50412 Malignant neoplasm of upper-outer quadrant of left female breast: Secondary | ICD-10-CM | POA: Diagnosis not present

## 2016-07-28 DIAGNOSIS — I4891 Unspecified atrial fibrillation: Secondary | ICD-10-CM | POA: Diagnosis present

## 2016-07-28 DIAGNOSIS — R7881 Bacteremia: Secondary | ICD-10-CM | POA: Diagnosis present

## 2016-07-28 DIAGNOSIS — I110 Hypertensive heart disease with heart failure: Secondary | ICD-10-CM | POA: Diagnosis not present

## 2016-07-28 DIAGNOSIS — J9601 Acute respiratory failure with hypoxia: Secondary | ICD-10-CM | POA: Diagnosis not present

## 2016-07-28 DIAGNOSIS — Z79811 Long term (current) use of aromatase inhibitors: Secondary | ICD-10-CM

## 2016-07-28 DIAGNOSIS — I160 Hypertensive urgency: Secondary | ICD-10-CM | POA: Diagnosis not present

## 2016-07-28 DIAGNOSIS — I5030 Unspecified diastolic (congestive) heart failure: Secondary | ICD-10-CM | POA: Diagnosis present

## 2016-07-28 DIAGNOSIS — Z90710 Acquired absence of both cervix and uterus: Secondary | ICD-10-CM

## 2016-07-28 DIAGNOSIS — I471 Supraventricular tachycardia: Secondary | ICD-10-CM | POA: Diagnosis present

## 2016-07-28 DIAGNOSIS — Z923 Personal history of irradiation: Secondary | ICD-10-CM

## 2016-07-28 DIAGNOSIS — F411 Generalized anxiety disorder: Secondary | ICD-10-CM | POA: Diagnosis present

## 2016-07-28 DIAGNOSIS — R778 Other specified abnormalities of plasma proteins: Secondary | ICD-10-CM | POA: Diagnosis present

## 2016-07-28 DIAGNOSIS — K219 Gastro-esophageal reflux disease without esophagitis: Secondary | ICD-10-CM | POA: Diagnosis not present

## 2016-07-28 DIAGNOSIS — Z79899 Other long term (current) drug therapy: Secondary | ICD-10-CM

## 2016-07-28 DIAGNOSIS — J449 Chronic obstructive pulmonary disease, unspecified: Secondary | ICD-10-CM | POA: Diagnosis not present

## 2016-07-28 DIAGNOSIS — Z8711 Personal history of peptic ulcer disease: Secondary | ICD-10-CM

## 2016-07-28 DIAGNOSIS — F1410 Cocaine abuse, uncomplicated: Secondary | ICD-10-CM | POA: Diagnosis present

## 2016-07-28 DIAGNOSIS — J810 Acute pulmonary edema: Secondary | ICD-10-CM | POA: Diagnosis not present

## 2016-07-28 DIAGNOSIS — Z8550 Personal history of malignant neoplasm of unspecified urinary tract organ: Secondary | ICD-10-CM

## 2016-07-28 DIAGNOSIS — F17210 Nicotine dependence, cigarettes, uncomplicated: Secondary | ICD-10-CM | POA: Diagnosis present

## 2016-07-28 DIAGNOSIS — J811 Chronic pulmonary edema: Secondary | ICD-10-CM | POA: Diagnosis present

## 2016-07-28 DIAGNOSIS — R0602 Shortness of breath: Secondary | ICD-10-CM

## 2016-07-28 DIAGNOSIS — E785 Hyperlipidemia, unspecified: Secondary | ICD-10-CM | POA: Diagnosis not present

## 2016-07-28 DIAGNOSIS — M109 Gout, unspecified: Secondary | ICD-10-CM | POA: Diagnosis not present

## 2016-07-28 DIAGNOSIS — J9600 Acute respiratory failure, unspecified whether with hypoxia or hypercapnia: Secondary | ICD-10-CM | POA: Diagnosis not present

## 2016-07-28 DIAGNOSIS — I509 Heart failure, unspecified: Secondary | ICD-10-CM | POA: Diagnosis not present

## 2016-07-28 DIAGNOSIS — I10 Essential (primary) hypertension: Secondary | ICD-10-CM | POA: Diagnosis not present

## 2016-07-28 DIAGNOSIS — E876 Hypokalemia: Secondary | ICD-10-CM | POA: Diagnosis present

## 2016-07-28 DIAGNOSIS — Z853 Personal history of malignant neoplasm of breast: Secondary | ICD-10-CM | POA: Diagnosis present

## 2016-07-28 DIAGNOSIS — B953 Streptococcus pneumoniae as the cause of diseases classified elsewhere: Secondary | ICD-10-CM | POA: Diagnosis present

## 2016-07-28 LAB — COMPREHENSIVE METABOLIC PANEL
ALT: 22 U/L (ref 14–54)
ANION GAP: 10 (ref 5–15)
AST: 30 U/L (ref 15–41)
Albumin: 2.3 g/dL — ABNORMAL LOW (ref 3.5–5.0)
Alkaline Phosphatase: 77 U/L (ref 38–126)
BUN: 19 mg/dL (ref 6–20)
CHLORIDE: 103 mmol/L (ref 101–111)
CO2: 21 mmol/L — AB (ref 22–32)
Calcium: 9.1 mg/dL (ref 8.9–10.3)
Creatinine, Ser: 0.98 mg/dL (ref 0.44–1.00)
GFR, EST NON AFRICAN AMERICAN: 60 mL/min — AB (ref >60)
Glucose, Bld: 119 mg/dL — ABNORMAL HIGH (ref 65–99)
POTASSIUM: 3.2 mmol/L — AB (ref 3.5–5.1)
SODIUM: 134 mmol/L — AB (ref 135–145)
Total Bilirubin: 0.4 mg/dL (ref 0.3–1.2)
Total Protein: 7.5 g/dL (ref 6.5–8.1)

## 2016-07-28 LAB — CBC WITH DIFFERENTIAL/PLATELET
Basophils Absolute: 0 10*3/uL (ref 0.0–0.1)
Basophils Relative: 0 %
EOS ABS: 0 10*3/uL (ref 0.0–0.7)
EOS PCT: 0 %
HCT: 40.7 % (ref 36.0–46.0)
Hemoglobin: 13.4 g/dL (ref 12.0–15.0)
LYMPHS ABS: 0.7 10*3/uL (ref 0.7–4.0)
Lymphocytes Relative: 7 %
MCH: 29.3 pg (ref 26.0–34.0)
MCHC: 32.9 g/dL (ref 30.0–36.0)
MCV: 88.9 fL (ref 78.0–100.0)
MONO ABS: 0.6 10*3/uL (ref 0.1–1.0)
Monocytes Relative: 6 %
Neutro Abs: 8.5 10*3/uL — ABNORMAL HIGH (ref 1.7–7.7)
Neutrophils Relative %: 87 %
PLATELETS: 363 10*3/uL (ref 150–400)
RBC: 4.58 MIL/uL (ref 3.87–5.11)
RDW: 14.6 % (ref 11.5–15.5)
WBC: 9.8 10*3/uL (ref 4.0–10.5)

## 2016-07-28 LAB — I-STAT VENOUS BLOOD GAS, ED
ACID-BASE DEFICIT: 1 mmol/L (ref 0.0–2.0)
Bicarbonate: 22.6 mmol/L (ref 20.0–28.0)
O2 Saturation: 94 %
PH VEN: 7.434 — AB (ref 7.250–7.430)
TCO2: 24 mmol/L (ref 0–100)
pCO2, Ven: 33.8 mmHg — ABNORMAL LOW (ref 44.0–60.0)
pO2, Ven: 67 mmHg — ABNORMAL HIGH (ref 32.0–45.0)

## 2016-07-28 LAB — I-STAT TROPONIN, ED: TROPONIN I, POC: 0.08 ng/mL (ref 0.00–0.08)

## 2016-07-28 LAB — MAGNESIUM: MAGNESIUM: 1.5 mg/dL — AB (ref 1.7–2.4)

## 2016-07-28 LAB — BRAIN NATRIURETIC PEPTIDE: B NATRIURETIC PEPTIDE 5: 1460.6 pg/mL — AB (ref 0.0–100.0)

## 2016-07-28 LAB — I-STAT CG4 LACTIC ACID, ED: LACTIC ACID, VENOUS: 1.71 mmol/L (ref 0.5–1.9)

## 2016-07-28 MED ORDER — ACETAMINOPHEN 325 MG PO TABS
650.0000 mg | ORAL_TABLET | Freq: Once | ORAL | Status: AC
  Administered 2016-07-28: 650 mg via ORAL
  Filled 2016-07-28: qty 2

## 2016-07-28 MED ORDER — FUROSEMIDE 10 MG/ML IJ SOLN
20.0000 mg | Freq: Once | INTRAMUSCULAR | Status: AC
  Administered 2016-07-28: 20 mg via INTRAVENOUS
  Filled 2016-07-28: qty 2

## 2016-07-28 MED ORDER — DEXTROSE 5 % IV SOLN
5.0000 mg/h | INTRAVENOUS | Status: DC
  Administered 2016-07-29: 15 mg/h via INTRAVENOUS
  Administered 2016-07-29: 5 mg/h via INTRAVENOUS
  Administered 2016-07-30 – 2016-07-31 (×2): 10 mg/h via INTRAVENOUS
  Filled 2016-07-28 (×5): qty 100

## 2016-07-28 MED ORDER — ASPIRIN 81 MG PO CHEW
324.0000 mg | CHEWABLE_TABLET | Freq: Once | ORAL | Status: AC
  Administered 2016-07-28: 324 mg via ORAL
  Filled 2016-07-28: qty 4

## 2016-07-28 MED ORDER — ACETAMINOPHEN 325 MG PO TABS
650.0000 mg | ORAL_TABLET | ORAL | Status: DC | PRN
  Filled 2016-07-28: qty 2

## 2016-07-28 MED ORDER — HYDRALAZINE HCL 20 MG/ML IJ SOLN: 10.0000 mg | INTRAMUSCULAR | Status: DC | PRN

## 2016-07-28 MED ORDER — NITROGLYCERIN 2 % TD OINT
0.5000 [in_us] | TOPICAL_OINTMENT | Freq: Four times a day (QID) | TRANSDERMAL | Status: DC
  Administered 2016-07-29 – 2016-08-02 (×16): 0.5 [in_us] via TOPICAL
  Filled 2016-07-28 (×18): qty 30
  Filled 2016-07-28: qty 1
  Filled 2016-07-28 (×11): qty 30

## 2016-07-28 MED ORDER — SACCHAROMYCES BOULARDII 250 MG PO CAPS
250.0000 mg | ORAL_CAPSULE | Freq: Two times a day (BID) | ORAL | Status: DC
  Administered 2016-07-29 – 2016-08-02 (×9): 250 mg via ORAL
  Filled 2016-07-28 (×9): qty 1

## 2016-07-28 MED ORDER — ANASTROZOLE 1 MG PO TABS
1.0000 mg | ORAL_TABLET | Freq: Every day | ORAL | Status: DC
  Administered 2016-07-29 – 2016-08-02 (×5): 1 mg via ORAL
  Filled 2016-07-28 (×5): qty 1

## 2016-07-28 MED ORDER — AMLODIPINE BESYLATE 5 MG PO TABS: 10.0000 mg | ORAL_TABLET | Freq: Every day | ORAL | Status: DC

## 2016-07-28 MED ORDER — SODIUM CHLORIDE 0.9 % IV SOLN
30.0000 meq | Freq: Once | INTRAVENOUS | Status: AC
  Administered 2016-07-29: 30 meq via INTRAVENOUS
  Filled 2016-07-28: qty 15

## 2016-07-28 MED ORDER — DILTIAZEM HCL 100 MG IV SOLR
5.0000 mg/h | INTRAVENOUS | Status: DC
  Administered 2016-07-28: 10 mg/h via INTRAVENOUS
  Administered 2016-07-28: 5 mg/h via INTRAVENOUS
  Filled 2016-07-28: qty 100

## 2016-07-28 MED ORDER — DILTIAZEM HCL 25 MG/5ML IV SOLN
20.0000 mg | Freq: Once | INTRAVENOUS | Status: AC
  Administered 2016-07-28: 20 mg via INTRAVENOUS

## 2016-07-28 MED ORDER — HYDRALAZINE HCL 10 MG PO TABS
10.0000 mg | ORAL_TABLET | Freq: Three times a day (TID) | ORAL | Status: DC
  Administered 2016-07-29 – 2016-08-02 (×12): 10 mg via ORAL
  Filled 2016-07-28 (×11): qty 1

## 2016-07-28 MED ORDER — MAGNESIUM SULFATE 2 GM/50ML IV SOLN
2.0000 g | Freq: Once | INTRAVENOUS | Status: AC
  Administered 2016-07-29: 2 g via INTRAVENOUS
  Filled 2016-07-28: qty 50

## 2016-07-28 MED ORDER — GABAPENTIN 300 MG PO CAPS
300.0000 mg | ORAL_CAPSULE | Freq: Three times a day (TID) | ORAL | Status: DC
  Administered 2016-07-29 – 2016-08-02 (×13): 300 mg via ORAL
  Filled 2016-07-28 (×13): qty 1

## 2016-07-28 MED ORDER — HEPARIN (PORCINE) IN NACL 100-0.45 UNIT/ML-% IJ SOLN
1050.0000 [IU]/h | INTRAMUSCULAR | Status: DC
  Administered 2016-07-28: 800 [IU]/h via INTRAVENOUS
  Filled 2016-07-28 (×2): qty 250

## 2016-07-28 MED ORDER — ALPRAZOLAM 0.25 MG PO TABS
0.2500 mg | ORAL_TABLET | Freq: Two times a day (BID) | ORAL | Status: DC | PRN
  Administered 2016-07-29 – 2016-08-01 (×5): 0.25 mg via ORAL
  Filled 2016-07-28 (×5): qty 1

## 2016-07-28 MED ORDER — POTASSIUM CHLORIDE CRYS ER 20 MEQ PO TBCR
30.0000 meq | EXTENDED_RELEASE_TABLET | Freq: Two times a day (BID) | ORAL | Status: DC
  Administered 2016-07-29 – 2016-07-30 (×3): 30 meq via ORAL
  Filled 2016-07-28 (×3): qty 1

## 2016-07-28 MED ORDER — BUSPIRONE HCL 10 MG PO TABS
10.0000 mg | ORAL_TABLET | Freq: Two times a day (BID) | ORAL | Status: DC
  Administered 2016-07-29 – 2016-08-02 (×9): 10 mg via ORAL
  Filled 2016-07-28 (×9): qty 1

## 2016-07-28 MED ORDER — OSELTAMIVIR PHOSPHATE 30 MG PO CAPS
30.0000 mg | ORAL_CAPSULE | Freq: Two times a day (BID) | ORAL | Status: AC
  Administered 2016-07-29 – 2016-07-31 (×6): 30 mg via ORAL
  Filled 2016-07-28 (×7): qty 1

## 2016-07-28 MED ORDER — HEPARIN BOLUS VIA INFUSION
2000.0000 [IU] | Freq: Once | INTRAVENOUS | Status: AC
  Administered 2016-07-28: 2000 [IU] via INTRAVENOUS
  Filled 2016-07-28: qty 2000

## 2016-07-28 MED ORDER — ONDANSETRON HCL 4 MG/2ML IJ SOLN: 4.0000 mg | Freq: Four times a day (QID) | INTRAMUSCULAR | Status: DC | PRN

## 2016-07-28 MED ORDER — DEXTROSE 5 % IV SOLN
1.0000 g | Freq: Once | INTRAVENOUS | Status: AC
  Administered 2016-07-28: 1 g via INTRAVENOUS
  Filled 2016-07-28: qty 10

## 2016-07-28 MED ORDER — DILTIAZEM LOAD VIA INFUSION
10.0000 mg | Freq: Once | INTRAVENOUS | Status: AC
  Administered 2016-07-28: 20 mg via INTRAVENOUS
  Filled 2016-07-28: qty 10

## 2016-07-28 MED ORDER — LORAZEPAM 2 MG/ML IJ SOLN
1.0000 mg | Freq: Once | INTRAMUSCULAR | Status: AC
  Administered 2016-07-28: 1 mg via INTRAVENOUS
  Filled 2016-07-28: qty 1
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
## 2016-07-28 NOTE — ED Notes (Signed)
Per MD Issacs verbal order to give 20 mg bolus dose of cardizem

## 2016-07-28 NOTE — ED Triage Notes (Signed)
Pt presents to the ED via [facility] for SOB and A-fib. No known hx of afib. Pt went to [facility] yesterday and given tamiflu and new htn meds but has not picked either up. Given 5 mg of albertol and 1.5 mg of atrovent via [facility] and 20 mg of cardizem. BP 210/100 and HR as high as 193 with EMS.

## 2016-07-28 NOTE — ED Notes (Signed)
Attempted piv x 2.

## 2016-07-29 ENCOUNTER — Ambulatory Visit: Admitting: Cardiology

## 2016-07-29 ENCOUNTER — Inpatient Hospital Stay (HOSPITAL_COMMUNITY)

## 2016-07-29 DIAGNOSIS — J810 Acute pulmonary edema: Secondary | ICD-10-CM | POA: Insufficient documentation

## 2016-07-29 DIAGNOSIS — I509 Heart failure, unspecified: Secondary | ICD-10-CM

## 2016-07-29 LAB — TROPONIN I
TROPONIN I: 0.11 ng/mL — AB (ref <0.03)
Troponin I: 0.09 ng/mL (ref <0.03)
Troponin I: 0.06 ng/mL (ref <0.03)

## 2016-07-29 LAB — BASIC METABOLIC PANEL
ANION GAP: 8 (ref 5–15)
BUN: 20 mg/dL (ref 6–20)
CALCIUM: 8.3 mg/dL — AB (ref 8.9–10.3)
CHLORIDE: 106 mmol/L (ref 101–111)
CO2: 22 mmol/L (ref 22–32)
CREATININE: 0.91 mg/dL (ref 0.44–1.00)
GFR calc non Af Amer: >60 mL/min (ref >60)
Glucose, Bld: 109 mg/dL — ABNORMAL HIGH (ref 65–99)
Potassium: 3.6 mmol/L (ref 3.5–5.1)
SODIUM: 136 mmol/L (ref 135–145)

## 2016-07-29 LAB — T4, FREE: FREE T4: 1.04 ng/dL (ref 0.61–1.12)

## 2016-07-29 LAB — ECHOCARDIOGRAM COMPLETE

## 2016-07-29 LAB — HEPARIN LEVEL (UNFRACTIONATED)
HEPARIN UNFRACTIONATED: 0.18 [IU]/mL — AB (ref 0.30–0.70)
HEPARIN UNFRACTIONATED: 0.27 [IU]/mL — AB (ref 0.30–0.70)

## 2016-07-29 LAB — TSH: TSH: 2.167 u[IU]/mL (ref 0.350–4.500)

## 2016-07-29 LAB — MAGNESIUM: Magnesium: 2.3 mg/dL (ref 1.7–2.4)

## 2016-07-29 LAB — MRSA PCR SCREENING: MRSA BY PCR: NEGATIVE

## 2016-07-29 MED ORDER — IPRATROPIUM-ALBUTEROL 0.5-2.5 (3) MG/3ML IN SOLN
3.0000 mL | RESPIRATORY_TRACT | Status: DC | PRN
  Administered 2016-07-29 – 2016-07-31 (×2): 3 mL via RESPIRATORY_TRACT
  Filled 2016-07-29 (×2): qty 3

## 2016-07-29 MED ORDER — FUROSEMIDE 10 MG/ML IJ SOLN
40.0000 mg | Freq: Two times a day (BID) | INTRAMUSCULAR | Status: DC
  Administered 2016-07-29 (×2): 40 mg via INTRAVENOUS
  Filled 2016-07-29 (×2): qty 4

## 2016-07-29 NOTE — Progress Notes (Signed)
PROGRESS NOTE  Victoria Burke  [id number] DOB: Nov 19, 1950 DOA: 07/28/2016 PCP: Dorena Dew, FNP Outpatient Specialists:  Subjective: This is complaining about shortness of breath, denies any chest pain.  Brief Narrative:  Victoria Burke is a 66 y.o. female with medical history significant for cancer of the left breast status post excision and radiation, COPD, cocaine abuse, anxiety, and hypertension who presents to the emergency department with acute onset of dyspnea. Patient was admitted to the hospital from 07/24/2016 - 07/27/2016 for management of acute exacerbation and COPD, precipitated by influenza A and likely secondary bacterial pneumonia with blood culture positive for strep pneumoniae. She reports feeling much improved by time of hospital discharge and reports waking today with no respiratory distress. Later in the day, after smoking cigarettes and cocaine, patient developed acute onset of severe shortness of breath while at rest. She denies any associated chest pain or palpitations and denies any lower extremity swelling or tenderness. She has continued to cough since the recent hospitalization, but reports no worsening in this. She denies fevers or chills, but endorses a general malaise. Patient reports feeling as though she might die due to the inability to catch her breath and EMS was called. Patient was noted to be in atrial fibrillation with rapid rate on EMS monitor. She was treated with DuoNeb and 20 mg IV diltiazem en route.  Assessment & Plan:   Principal Problem:   Atrial fibrillation with rapid ventricular response (HCC) Active Problems:   Breast cancer of upper-outer quadrant of left female breast (HCC)   Hypokalemia   Acute pulmonary edema (HCC)   Cocaine abuse   Chronic obstructive pulmonary disease (HCC)   Essential hypertension   Generalized anxiety disorder   Acute respiratory failure (HCC)   Hypomagnesemia   Bacteremia due to Streptococcus  pneumoniae   Atrial fibrillation with RVR  - Atrial arrhythmias noted on prior EKG's, but not a fib  - Appears to go in and out of atrial fibrillation on cardiac monitor, with rate mainly in 120's-130's - CHADS-VASc may only be 2 (gender, HTN); she has been started on heparin infusion  - Diltiazem infusion continued, titrated as needed for goal HR 60-105  - She had TTE on 07/20/15 with moderate left atrial enlargement and mild tricuspid regurgitation; will update - Replete electrolytes, check thyroid studies, cycle troponin to exclude ischemic etiology  - Likely precipitated by cocaine use and/or pulmonary disease, avoid beta blockers for now.  -If rate is not controlled with Cardizem alone will consult cardiology.  Acute pulmonary edema, acute respiratory failure  - Pt presents with markedly labored breathing, improved with BiPAP in ED  - There are crackles throughout on auscultation and CXR features cardiomegaly and diffuse pulmonary edema  - Likely secondary to rapid rate and/or uncontrolled HTN  - TTE (07/20/15) with EF 65-70%, moderate LVH, moderate LAE, mild TR; will update  - Received 1 dose of Lasix, repeat Lasix and follow renal function closely.  Hypertension with hypertensive urgency  - BP 180/100 range on admission, improved with diltiazem infusion and Lasix   - Remains hypertensive and will be treated with hydralazine and NTG for now  - Norvasc held on admission as she is on diltiazem infusion    Cocaine abuse  - Pt endorses smoking cocaine shortly prior to symptom-onset  - Counseled her briefly; will avoid beta-blockers, ask social work to consult   COPD  - No wheezing or obstructed breathing on admission  - Continue prn neb treatments.  Influenza A, strep pneumo bacteremia  - PCR positive for influenza A on 07/24/2016; she had been started on empiric Tamiflu earlier that day - She completed 5 days treatment on night of admission, but given ongoing respiratory sxs,  will continue for now; if respiratory sxs resolve with treatment of edema, could likely stop  - Discharged home on Vantin, restarted Rocephin, repeat blood cultures.  Hypokalemia, hypomagnesemia  - Serum potassium 3.2 and magnesium 1.5 on admission  - She was treated with 30 mEq IV potassium, 30 mEq oral potassium, 2 g IV magnesium  - This is resolved with oral potassium supplementation.  Breast cancer - Left breast cancer s/p resection and radiation  - Continue Arimidex as tolerated    Anxiety  - Appears calm after receiving Ativan in ED  - Continue Buspar, low-dose prn Ativan    DVT prophylaxis:  Code Status: Full Code Family Communication:  Disposition Plan:  Diet: Diet Heart Room service appropriate? Yes; Fluid consistency: Thin; Fluid restriction: 1500 mL Fluid  Consultants:   None  Procedures:   None  Antimicrobials:   None   Objective: Vitals:   07/29/16 0430 07/29/16 0431 07/29/16 0500 07/29/16 0530  BP: 125/84  139/72 142/84  Pulse:  62 65   Resp:  17 24 24   SpO2: 100% 100% 100% 100%    Intake/Output Summary (Last 24 hours) at 07/29/16 1224 Last data filed at 07/29/16 0333  Gross per 24 hour  Intake               50 ml  Output                0 ml  Net               50 ml   There were no vitals filed for this visit.  Examination: General exam: Appears calm and comfortable  Respiratory system: Clear to auscultation. Respiratory effort normal. Cardiovascular system: S1 & S2 heard, RRR. No JVD, murmurs, rubs, gallops or clicks. No pedal edema. Gastrointestinal system: Abdomen is nondistended, soft and nontender. No organomegaly or masses felt. Normal bowel sounds heard. Central nervous system: Alert and oriented. No focal neurological deficits. Extremities: Symmetric 5 x 5 power. Skin: No rashes, lesions or ulcers Psychiatry: Judgement and insight appear normal. Mood & affect appropriate.   Data Reviewed: I have personally reviewed following labs  and imaging studies  CBC:  Recent Labs Lab 07/24/16 2030 07/27/16 0538 07/28/16 2038  WBC 10.0 13.3* 9.8  NEUTROABS 8.7*  --  8.5*  HGB 13.9 12.2 13.4  HCT 41.2 37.8 40.7  MCV 86.6 90.0 88.9  PLT 388 361 [id number]   Basic Metabolic Panel:  Recent Labs Lab 07/25/16 0422 07/26/16 0501 07/27/16 0538 07/28/16 2038 07/29/16 0543  NA 135 136 135 134* 136  K 3.6 3.9 4.2 3.2* 3.6  CL 106 107 108 103 106  CO2 21* 21* 19* 21* 22  GLUCOSE 140* 140* 194* 119* 109*  BUN 12 28* 32* 19 20  CREATININE 0.88 1.22* 1.06* 0.98 0.91  CALCIUM 8.0* 8.3* 8.4* 9.1 8.3*  MG  --  1.7  --  1.5* 2.3   GFR: Estimated Creatinine Clearance: 49.4 mL/min (by C-G formula based on SCr of 0.91 mg/dL). Liver Function Tests:  Recent Labs Lab 07/28/16 2038  AST 30  ALT 22  ALKPHOS 77  BILITOT 0.4  PROT 7.5  ALBUMIN 2.3*   No results for input(s): LIPASE, AMYLASE in the last 168 hours. No results  for input(s): AMMONIA in the last 168 hours. Coagulation Profile:  Recent Labs Lab 07/24/16 2330  INR 1.02   Cardiac Enzymes:  Recent Labs Lab 07/29/16 0001 07/29/16 0543  TROPONINI 0.11* 0.09*   BNP (last 3 results) No results for input(s): PROBNP in the last 8760 hours. HbA1C: No results for input(s): HGBA1C in the last 72 hours. CBG:  Recent Labs Lab 07/25/16 1136 07/26/16 0732 07/27/16 0718  GLUCAP 171* 155* 130*   Lipid Profile: No results for input(s): CHOL, HDL, LDLCALC, TRIG, CHOLHDL, LDLDIRECT in the last 72 hours. Thyroid Function Tests:  Recent Labs  07/29/16 0001  TSH 2.167  FREET4 1.04   Anemia Panel: No results for input(s): VITAMINB12, FOLATE, FERRITIN, TIBC, IRON, RETICCTPCT in the last 72 hours. Urine analysis:    Component Value Date/Time   COLORURINE YELLOW 07/24/2016 2100   APPEARANCEUR CLEAR 07/24/2016 2100   LABSPEC 1.013 07/24/2016 2100   PHURINE 5.0 07/24/2016 2100   GLUCOSEU NEGATIVE 07/24/2016 2100   HGBUR SMALL (A) 07/24/2016 2100   [place]  NEGATIVE 07/24/2016 2100   KETONESUR NEGATIVE 07/24/2016 2100   PROTEINUR >=300 (A) 07/24/2016 2100   UROBILINOGEN 0.2 06/25/2016 1507   NITRITE NEGATIVE 07/24/2016 2100   LEUKOCYTESUR NEGATIVE 07/24/2016 2100   Sepsis Labs: @LABRCNTIP (procalcitonin:4,lacticidven:4)  ) Recent Results (from the past 240 hour(s))  Urine culture     Status: Abnormal   Collection Time: 07/24/16  9:00 PM  Result Value Ref Range Status   Specimen Description URINE, CLEAN CATCH  Final   Special Requests NONE  Final   Culture (A)  Final    <10,000 COLONIES/mL INSIGNIFICANT GROWTH Performed at [facility], [address], [place], [place] [address]    Report Status 07/26/2016 FINAL  Final  Blood Culture (routine x 2)     Status: None (Preliminary result)   Collection Time: 07/24/16  9:20 PM  Result Value Ref Range Status   Specimen Description RIGHT ANTECUBITAL  Final   Special Requests BOTTLES DRAWN AEROBIC AND ANAEROBIC 5CC  Final   Culture   Final    NO GROWTH 3 DAYS Performed at [facility], [place] [address], [place], [place] [address]    Report Status PENDING  Incomplete  Blood Culture (routine x 2)     Status: Abnormal   Collection Time: 07/24/16  9:22 PM  Result Value Ref Range Status   Specimen Description PORTA CATH  Final   Special Requests BOTTLES DRAWN AEROBIC AND ANAEROBIC 5CC  Final   Culture  Setup Time   Final    GRAM POSITIVE COCCI IN CHAINS IN PAIRS ANAEROBIC BOTTLE ONLY CRITICAL RESULT CALLED TO, READ BACK BY AND VERIFIED WITHSeleta Rhymes [facility] 2150 07/25/16 A BROWNING Performed at [facility], [place] [address], [place], [place] [address]    Culture STREPTOCOCCUS PNEUMONIAE (A)  Final   Report Status 07/27/2016 FINAL  Final   Organism ID, Bacteria STREPTOCOCCUS PNEUMONIAE  Final      Susceptibility   Streptococcus pneumoniae - MIC*    ERYTHROMYCIN <=0.12 SENSITIVE Sensitive     LEVOFLOXACIN 1 SENSITIVE Sensitive     PENICILLIN <=0.06 SENSITIVE Sensitive      CEFTRIAXONE <=0.12 SENSITIVE Sensitive     * STREPTOCOCCUS PNEUMONIAE  Blood Culture ID Panel (Reflexed)     Status: Abnormal   Collection Time: 07/24/16  9:22 PM  Result Value Ref Range Status   Enterococcus species NOT DETECTED NOT DETECTED Final   Listeria monocytogenes NOT DETECTED  NOT DETECTED Final   Staphylococcus species NOT DETECTED NOT DETECTED Final   Staphylococcus aureus NOT DETECTED NOT DETECTED Final   Streptococcus species DETECTED (A) NOT DETECTED Final    Comment: CRITICAL RESULT CALLED TO, READ BACK BY AND VERIFIED WITH: E JACKSON PHARMD 2150 07/25/16 A BROWNING    Streptococcus agalactiae NOT DETECTED NOT DETECTED Final   Streptococcus pneumoniae DETECTED (A) NOT DETECTED Final    Comment: CRITICAL RESULT CALLED TO, READ BACK BY AND VERIFIED WITH: E JACKSON PHARMD 2150 07/25/16 A BROWNING    Streptococcus pyogenes NOT DETECTED NOT DETECTED Final   Acinetobacter baumannii NOT DETECTED NOT DETECTED Final   Enterobacteriaceae species NOT DETECTED NOT DETECTED Final   Enterobacter cloacae complex NOT DETECTED NOT DETECTED Final   Escherichia coli NOT DETECTED NOT DETECTED Final   Klebsiella oxytoca NOT DETECTED NOT DETECTED Final   Klebsiella pneumoniae NOT DETECTED NOT DETECTED Final   Proteus species NOT DETECTED NOT DETECTED Final   Serratia marcescens NOT DETECTED NOT DETECTED Final   Haemophilus influenzae NOT DETECTED NOT DETECTED Final   Neisseria meningitidis NOT DETECTED NOT DETECTED Final   Pseudomonas aeruginosa NOT DETECTED NOT DETECTED Final   Candida albicans NOT DETECTED NOT DETECTED Final   Candida glabrata NOT DETECTED NOT DETECTED Final   Candida krusei NOT DETECTED NOT DETECTED Final   Candida parapsilosis NOT DETECTED NOT DETECTED Final   Candida tropicalis NOT DETECTED NOT DETECTED Final    Comment: Performed at [facility], [place] [address] [place], [place] [address]  MRSA PCR Screening     Status: None   Collection Time: 07/29/16  7:01  AM  Result Value Ref Range Status   MRSA by PCR NEGATIVE NEGATIVE Final    Comment:        The GeneXpert MRSA Assay (FDA approved for NASAL specimens only), is one component of a comprehensive MRSA colonization surveillance program. It is not intended to diagnose MRSA infection nor to guide or monitor treatment for MRSA infections.      Invalid input(s): PROCALCITONIN, LACTICACIDVEN   Radiology Studies: Dg Chest Port 1 View  Result Date: 07/28/2016 CLINICAL DATA:  Initial evaluation for acute cough, shortness of breath. EXAM: PORTABLE CHEST 1 VIEW COMPARISON:  Prior radiograph from 07/26/2016. FINDINGS: Cardiomegaly, stable from previous. Mediastinal silhouette within normal limits. Aortic atherosclerosis noted. Lungs normally inflated. Diffuse pulmonary vascular congestion with indistinctness of the interstitial markings suggesting pulmonary edema. Small bilateral pleural effusions present. More confluent bibasilar opacities favored to reflect edema and/ or atelectasis, although infiltrates not excluded. No pneumothorax. No acute osseous abnormality. Surgical clips overlie the left axilla. IMPRESSION: 1. Cardiomegaly with moderate diffuse pulmonary edema and small bilateral pleural effusions. 2. More confluent patchy bibasilar opacities, favored to reflect atelectasis/edema, although superimposed infiltrates could be considered in the correct clinical setting. 3. Aortic atherosclerosis. Electronically Signed   By: Jeannine Boga M.D.   On: 07/28/2016 21:32        Scheduled Meds: . anastrozole  1 mg Oral Daily  . busPIRone  10 mg Oral BID  . gabapentin  300 mg Oral TID  . hydrALAZINE  10 mg Oral Q8H  . nitroGLYCERIN  0.5 inch Topical Q6H  . oseltamivir  30 mg Oral BID  . potassium chloride  30 mEq Oral BID  . saccharomyces boulardii  250 mg Oral BID   Continuous Infusions: . diltiazem (CARDIZEM) infusion 5 mg/hr (07/29/16 0322)  . heparin 800 Units/hr (07/28/16 2203)      LOS: 1  day    Time spent: 35 minutes    Shabazz Mckey A, MD [facility] Pager [phone]  If 7PM-7AM, please contact night-coverage [email or web address] Password [facility] 07/29/2016, 12:24 PM

## 2016-07-29 NOTE — ED Notes (Signed)
Pharmacy sending new cardizem drip.

## 2016-07-29 NOTE — Progress Notes (Signed)
  Echocardiogram 2D Echocardiogram has been performed.  Jennette Dubin 07/29/2016, 11:02 AM

## 2016-07-29 NOTE — Progress Notes (Signed)
Pt hr increased to 140s. Pt up to bathroom. Cardizem increased to 15. B/P stable. Will continue to monitor

## 2016-07-29 NOTE — Progress Notes (Signed)
ANTICOAGULATION CONSULT NOTE - Follow Up Consult  Pharmacy Consult for Heparin  Indication: atrial fibrillation  No Known Allergies   Vital Signs: BP: 142/84 (01/22 0530) Pulse Rate: 65 (01/22 0500)  Labs:  Recent Labs  07/27/16 0538 07/28/16 2038 07/29/16 0001 07/29/16 0543  HGB 12.2 13.4  --   --   HCT 37.8 40.7  --   --   PLT 361 363  --   --   HEPARINUNFRC  --   --   --  0.18*  CREATININE 1.06* 0.98  --   --   TROPONINI  --   --  0.11*  --     Estimated Creatinine Clearance: 45.9 mL/min (by C-G formula based on SCr of 0.98 mg/dL).  Assessment: Heparin for afib, initial heparin level is low Goal of Therapy:  Heparin level 0.3-0.7 units/ml Monitor platelets by anticoagulation protocol: Yes   Plan:  -Inc heparin to 950 units/hr -1500 HL  Victoria Burke 07/29/2016,6:25 AM

## 2016-07-29 NOTE — Progress Notes (Signed)
ANTICOAGULATION CONSULT NOTE - Follow Up Consult  Pharmacy Consult for Heparin Indication: atrial fibrillation  No Known Allergies  Patient Measurements: Height: 5\' 1"  (154.9 cm) Weight: 116 lb 1.6 oz (52.7 kg) IBW/kg (Calculated) : 47.8 Heparin Dosing Weight:  52.7 kg  Vital Signs: Temp: 98.3 F (36.8 C) (01/22 1429) Temp Source: Oral (01/22 1429) BP: 187/87 (01/22 1529) Pulse Rate: 60 (01/22 1529)  Labs:  Recent Labs  07/27/16 0538 07/28/16 2038 07/29/16 0001 07/29/16 0543 07/29/16 1240 07/29/16 1445  HGB 12.2 13.4  --   --   --   --   HCT 37.8 40.7  --   --   --   --   PLT 361 363  --   --   --   --   HEPARINUNFRC  --   --   --  0.18*  --  0.27*  CREATININE 1.06* 0.98  --  0.91  --   --   TROPONINI  --   --  0.11* 0.09* 0.06*  --     Estimated Creatinine Clearance: 47.1 mL/min (by C-G formula based on SCr of 0.91 mg/dL).  Assessment:  Anticoag: Afib, CBC stable. HL 0.18>>0.27 remains slightly low..  CHADS2VASC:2  Goal of Therapy:  Heparin level 0.3-0.7 units/ml Monitor platelets by anticoagulation protocol: Yes   Plan:  Increase IV heparin to 1050 units/hr Recheck HL and CBC in AM.  Nikola Blackston S. Alford Highland, PharmD, BCPS Clinical Staff Pharmacist Pager [phone]  Eilene Ghazi Stillinger 07/29/2016,3:55 PM

## 2016-07-29 NOTE — ED Notes (Addendum)
Pt removed herself from Bipap standing at foot of bed. Pt helped to bedside commode and back into bed. Pt resp e/u; NAD noted.

## 2016-07-29 NOTE — ED Notes (Signed)
Spoke with RT. Pt tolerating BiPap; will wait until 0100 to take pt off so that pt will have been on bipap 3 hours.

## 2016-07-29 NOTE — ED Notes (Signed)
Patient dyspnea at rest; pt given neb. RT notified.

## 2016-07-30 ENCOUNTER — Inpatient Hospital Stay (HOSPITAL_COMMUNITY)

## 2016-07-30 DIAGNOSIS — F1410 Cocaine abuse, uncomplicated: Secondary | ICD-10-CM

## 2016-07-30 DIAGNOSIS — R7881 Bacteremia: Secondary | ICD-10-CM

## 2016-07-30 DIAGNOSIS — I4891 Unspecified atrial fibrillation: Principal | ICD-10-CM

## 2016-07-30 DIAGNOSIS — I471 Supraventricular tachycardia: Secondary | ICD-10-CM

## 2016-07-30 LAB — CBC
HCT: 36.8 % (ref 36.0–46.0)
HEMOGLOBIN: 11.9 g/dL — AB (ref 12.0–15.0)
MCH: 28.6 pg (ref 26.0–34.0)
MCHC: 32.3 g/dL (ref 30.0–36.0)
MCV: 88.5 fL (ref 78.0–100.0)
Platelets: 299 10*3/uL (ref 150–400)
RBC: 4.16 MIL/uL (ref 3.87–5.11)
RDW: 14.7 % (ref 11.5–15.5)
WBC: 5.6 10*3/uL (ref 4.0–10.5)

## 2016-07-30 LAB — BASIC METABOLIC PANEL
ANION GAP: 7 (ref 5–15)
BUN: 21 mg/dL — ABNORMAL HIGH (ref 6–20)
CALCIUM: 8.2 mg/dL — AB (ref 8.9–10.3)
CO2: 25 mmol/L (ref 22–32)
Chloride: 103 mmol/L (ref 101–111)
Creatinine, Ser: 1.09 mg/dL — ABNORMAL HIGH (ref 0.44–1.00)
GFR, EST NON AFRICAN AMERICAN: 52 mL/min — AB (ref >60)
Glucose, Bld: 99 mg/dL (ref 65–99)
Potassium: 3.5 mmol/L (ref 3.5–5.1)
SODIUM: 135 mmol/L (ref 135–145)

## 2016-07-30 LAB — CULTURE, BLOOD (ROUTINE X 2): CULTURE: NO GROWTH

## 2016-07-30 LAB — HEPARIN LEVEL (UNFRACTIONATED): HEPARIN UNFRACTIONATED: 0.31 [IU]/mL (ref 0.30–0.70)

## 2016-07-30 MED ORDER — RIVAROXABAN 15 MG PO TABS
15.0000 mg | ORAL_TABLET | Freq: Every day | ORAL | Status: DC
  Administered 2016-07-30: 15 mg via ORAL
  Filled 2016-07-30: qty 1

## 2016-07-30 NOTE — Progress Notes (Addendum)
ANTICOAGULATION CONSULT NOTE - Follow Up Consult  Pharmacy Consult for Heparin Indication: atrial fibrillation  No Known Allergies  Patient Measurements: Height: 5\' 1"  (154.9 cm) Weight: 113 lb 6.4 oz (51.4 kg) IBW/kg (Calculated) : 47.8 Heparin Dosing Weight:  52.7 kg  Vital Signs: Temp: 98.4 F (36.9 C) (01/23 0744) Temp Source: Oral (01/23 0744) BP: 145/64 (01/23 0744) Pulse Rate: 74 (01/23 0744)  Labs:  Recent Labs  07/28/16 2038 07/29/16 0001 07/29/16 0543 07/29/16 1240 07/29/16 1445 07/30/16 0428  HGB 13.4  --   --   --   --  11.9*  HCT 40.7  --   --   --   --  36.8  PLT 363  --   --   --   --  299  HEPARINUNFRC  --   --  0.18*  --  0.27* 0.31  CREATININE 0.98  --  0.91  --   --  1.09*  TROPONINI  --  0.11* 0.09* 0.06*  --   --     Estimated Creatinine Clearance: 39.3 mL/min (by C-G formula based on SCr of 1.09 mg/dL (H)).  Assessment: 66 year old female continues on heparin for Afib Heparin level therapeutic this AM CBC stable  PM: transitioning to Xarelto 15 mg daily  Goal of Therapy:  Heparin level 0.3-0.7 units/ml Monitor platelets by anticoagulation protocol: Yes   Plan:  Continue heparin at 1050 units / hr Follow up AM labs Follow up for transition to po agent  DC heparin --> Xarelto 15 mg daily  Thank you Anette Guarneri PharmD [phone] 07/30/2016,8:27 AM

## 2016-07-30 NOTE — Progress Notes (Signed)
PROGRESS NOTE  Victoria Burke  [id number] DOB: 06/17/1951 DOA: 07/28/2016 PCP: Dorena Dew, FNP Outpatient Specialists:  Subjective: Still complaining about shortness of breath, denies palpitation or chest pain. Heart rate was up and down since yesterday, still on Cardizem drip.  Brief Narrative:  Victoria Burke is a 66 y.o. female with medical history significant for cancer of the left breast status post excision and radiation, COPD, cocaine abuse, anxiety, and hypertension who presents to the emergency department with acute onset of dyspnea. Patient was admitted to the hospital from 07/24/2016 - 07/27/2016 for management of acute exacerbation and COPD, precipitated by influenza A and likely secondary bacterial pneumonia with blood culture positive for strep pneumoniae. She reports feeling much improved by time of hospital discharge and reports waking today with no respiratory distress. Later in the day, after smoking cigarettes and cocaine, patient developed acute onset of severe shortness of breath while at rest. She denies any associated chest pain or palpitations and denies any lower extremity swelling or tenderness. She has continued to cough since the recent hospitalization, but reports no worsening in this. She denies fevers or chills, but endorses a general malaise. Patient reports feeling as though she might die due to the inability to catch her breath and EMS was called. Patient was noted to be in atrial fibrillation with rapid rate on EMS monitor. She was treated with DuoNeb and 20 mg IV diltiazem en route.  Assessment & Plan:   Principal Problem:   Atrial fibrillation with rapid ventricular response (HCC) Active Problems:   Breast cancer of upper-outer quadrant of left female breast (HCC)   Hypokalemia   Acute pulmonary edema (HCC)   Cocaine abuse   Chronic obstructive pulmonary disease (HCC)   Essential hypertension   Generalized anxiety disorder   Acute respiratory  failure (HCC)   Hypomagnesemia   Bacteremia due to Streptococcus pneumoniae   Atrial fibrillation with RVR  - Atrial arrhythmias noted on prior EKG's, but not a fib  - Appears to go in and out of atrial fibrillation on cardiac monitor, with rate mainly in 120's-130's - CHADS-VASc may only be 2 (gender, HTN); she has been started on heparin infusion, switched to Xarelto.  - Diltiazem infusion continued, titrated as needed for goal HR 60-105  - 2-D echo from 07/30/15 showed left atrial dilatation, LVEF is 65-70. - TSH is normal - Likely precipitated by cocaine use and/or pulmonary disease, avoid beta blockers for now.  - Cardiology to evaluate  Acute pulmonary edema, acute respiratory failure  - Pt presents with markedly labored breathing, improved with BiPAP in ED  - There are crackles throughout on auscultation and CXR features cardiomegaly and diffuse pulmonary edema  - Likely secondary to rapid rate and/or uncontrolled HTN  - 2-D echo showed LVEF of 65-70% with mild LVH, cardiology eval, might need digoxin (concurrent CHF)  Hypertension with hypertensive urgency  - BP 180/100 range on admission, improved with diltiazem infusion and Lasix   - Remains hypertensive and will be treated with hydralazine and NTG for now  - Norvasc held on admission as she is on diltiazem infusion    Cocaine abuse  - Pt endorses smoking cocaine shortly prior to symptom-onset  - Counseled her briefly; will avoid beta-blockers, ask social work to consult   COPD  - No wheezing or obstructed breathing on admission  - Continue prn neb treatments.   Influenza A, strep pneumo bacteremia  - PCR positive for influenza A on 07/24/2016; she had been  started on empiric Tamiflu earlier that day - She completed 5 days treatment on night of admission, but given ongoing respiratory sxs, will continue for now; if respiratory sxs resolve with treatment of edema, could likely stop  - Discharged home on Vantin,  restarted Rocephin, repeat blood cultures.  Hypokalemia, hypomagnesemia  - Serum potassium 3.2 and magnesium 1.5 on admission  - She was treated with 30 mEq IV potassium, 30 mEq oral potassium, 2 g IV magnesium  - This is resolved with oral potassium supplementation.  Breast cancer - Left breast cancer s/p resection and radiation  - Continue Arimidex as tolerated    Anxiety  - Appears calm after receiving Ativan in ED  - Continue Buspar, low-dose prn Ativan    DVT prophylaxis: Heparin Drip Code Status: Full Code Family Communication:  Disposition Plan:  Diet: Diet Heart Room service appropriate? Yes; Fluid consistency: Thin; Fluid restriction: 1500 mL Fluid  Consultants:   None  Procedures:   None  Antimicrobials:   None   Objective: Vitals:   07/29/16 2039 07/30/16 0034 07/30/16 0525 07/30/16 0744  BP:  (!) 145/87 (!) 141/84 (!) 145/64  Pulse: 96 (!) 50 (!) 105 74  Resp: (!) 28 (!) 26 (!) 28 20  Temp:  99.1 F (37.3 C) 97.8 F (36.6 C) 98.4 F (36.9 C)  TempSrc:  Oral Oral Oral  SpO2: 93% 98% 92% 96%  Weight:   51.4 kg (113 lb 6.4 oz)   Height:        Intake/Output Summary (Last 24 hours) at 07/30/16 1051 Last data filed at 07/30/16 0900  Gross per 24 hour  Intake           1005.1 ml  Output              700 ml  Net            305.1 ml   Filed Weights   07/29/16 1246 07/30/16 0525  Weight: 52.7 kg (116 lb 1.6 oz) 51.4 kg (113 lb 6.4 oz)    Examination: General exam: Appears calm and comfortable  Respiratory system: Clear to auscultation. Respiratory effort normal. Cardiovascular system: S1 & S2 heard, RRR. No JVD, murmurs, rubs, gallops or clicks. No pedal edema. Gastrointestinal system: Abdomen is nondistended, soft and nontender. No organomegaly or masses felt. Normal bowel sounds heard. Central nervous system: Alert and oriented. No focal neurological deficits. Extremities: Symmetric 5 x 5 power. Skin: No rashes, lesions or  ulcers Psychiatry: Judgement and insight appear normal. Mood & affect appropriate.   Data Reviewed: I have personally reviewed following labs and imaging studies  CBC:  Recent Labs Lab 07/24/16 2030 07/27/16 0538 07/28/16 2038 07/30/16 0428  WBC 10.0 13.3* 9.8 5.6  NEUTROABS 8.7*  --  8.5*  --   HGB 13.9 12.2 13.4 11.9*  HCT 41.2 37.8 40.7 36.8  MCV 86.6 90.0 88.9 88.5  PLT 388 361 363 [id number]   Basic Metabolic Panel:  Recent Labs Lab 07/26/16 0501 07/27/16 0538 07/28/16 2038 07/29/16 0543 07/30/16 0428  NA 136 135 134* 136 135  K 3.9 4.2 3.2* 3.6 3.5  CL 107 108 103 106 103  CO2 21* 19* 21* 22 25  GLUCOSE 140* 194* 119* 109* 99  BUN 28* 32* 19 20 21*  CREATININE 1.22* 1.06* 0.98 0.91 1.09*  CALCIUM 8.3* 8.4* 9.1 8.3* 8.2*  MG 1.7  --  1.5* 2.3  --    GFR: Estimated Creatinine Clearance: 39.3 mL/min (by C-G formula based  on SCr of 1.09 mg/dL (H)). Liver Function Tests:  Recent Labs Lab 07/28/16 2038  AST 30  ALT 22  ALKPHOS 77  BILITOT 0.4  PROT 7.5  ALBUMIN 2.3*   No results for input(s): LIPASE, AMYLASE in the last 168 hours. No results for input(s): AMMONIA in the last 168 hours. Coagulation Profile:  Recent Labs Lab 07/24/16 2330  INR 1.02   Cardiac Enzymes:  Recent Labs Lab 07/29/16 0001 07/29/16 0543 07/29/16 1240  TROPONINI 0.11* 0.09* 0.06*   BNP (last 3 results) No results for input(s): PROBNP in the last 8760 hours. HbA1C: No results for input(s): HGBA1C in the last 72 hours. CBG:  Recent Labs Lab 07/25/16 1136 07/26/16 0732 07/27/16 0718  GLUCAP 171* 155* 130*   Lipid Profile: No results for input(s): CHOL, HDL, LDLCALC, TRIG, CHOLHDL, LDLDIRECT in the last 72 hours. Thyroid Function Tests:  Recent Labs  07/29/16 0001  TSH 2.167  FREET4 1.04   Anemia Panel: No results for input(s): VITAMINB12, FOLATE, FERRITIN, TIBC, IRON, RETICCTPCT in the last 72 hours. Urine analysis:    Component Value Date/Time   COLORURINE  YELLOW 07/24/2016 2100   APPEARANCEUR CLEAR 07/24/2016 2100   LABSPEC 1.013 07/24/2016 2100   PHURINE 5.0 07/24/2016 2100   GLUCOSEU NEGATIVE 07/24/2016 2100   HGBUR SMALL (A) 07/24/2016 2100   [place] NEGATIVE 07/24/2016 2100   KETONESUR NEGATIVE 07/24/2016 2100   PROTEINUR >=300 (A) 07/24/2016 2100   UROBILINOGEN 0.2 06/25/2016 1507   NITRITE NEGATIVE 07/24/2016 2100   LEUKOCYTESUR NEGATIVE 07/24/2016 2100   Sepsis Labs: @LABRCNTIP (procalcitonin:4,lacticidven:4)  ) Recent Results (from the past 240 hour(s))  Urine culture     Status: Abnormal   Collection Time: 07/24/16  9:00 PM  Result Value Ref Range Status   Specimen Description URINE, CLEAN CATCH  Final   Special Requests NONE  Final   Culture (A)  Final    <10,000 COLONIES/mL INSIGNIFICANT GROWTH Performed at [facility], [address], [place], [place] [address]    Report Status 07/26/2016 FINAL  Final  Blood Culture (routine x 2)     Status: None (Preliminary result)   Collection Time: 07/24/16  9:20 PM  Result Value Ref Range Status   Specimen Description RIGHT ANTECUBITAL  Final   Special Requests BOTTLES DRAWN AEROBIC AND ANAEROBIC 5CC  Final   Culture   Final    NO GROWTH 4 DAYS Performed at [facility], [place] [address], [place], [place] [address]    Report Status PENDING  Incomplete  Blood Culture (routine x 2)     Status: Abnormal   Collection Time: 07/24/16  9:22 PM  Result Value Ref Range Status   Specimen Description PORTA CATH  Final   Special Requests BOTTLES DRAWN AEROBIC AND ANAEROBIC 5CC  Final   Culture  Setup Time   Final    GRAM POSITIVE COCCI IN CHAINS IN PAIRS ANAEROBIC BOTTLE ONLY CRITICAL RESULT CALLED TO, READ BACK BY AND VERIFIED WITHSeleta Rhymes [facility] 2150 07/25/16 A BROWNING Performed at [facility], [place] [address] [place], [place] [address]    Culture STREPTOCOCCUS PNEUMONIAE (A)  Final   Report Status 07/27/2016 FINAL  Final   Organism ID, Bacteria  STREPTOCOCCUS PNEUMONIAE  Final      Susceptibility   Streptococcus pneumoniae - MIC*    ERYTHROMYCIN <=0.12 SENSITIVE Sensitive     LEVOFLOXACIN 1 SENSITIVE Sensitive     PENICILLIN <=0.06 SENSITIVE Sensitive     CEFTRIAXONE <=  0.12 SENSITIVE Sensitive     * STREPTOCOCCUS PNEUMONIAE  Blood Culture ID Panel (Reflexed)     Status: Abnormal   Collection Time: 07/24/16  9:22 PM  Result Value Ref Range Status   Enterococcus species NOT DETECTED NOT DETECTED Final   Listeria monocytogenes NOT DETECTED NOT DETECTED Final   Staphylococcus species NOT DETECTED NOT DETECTED Final   Staphylococcus aureus NOT DETECTED NOT DETECTED Final   Streptococcus species DETECTED (A) NOT DETECTED Final    Comment: CRITICAL RESULT CALLED TO, READ BACK BY AND VERIFIED WITH: E JACKSON PHARMD 2150 07/25/16 A BROWNING    Streptococcus agalactiae NOT DETECTED NOT DETECTED Final   Streptococcus pneumoniae DETECTED (A) NOT DETECTED Final    Comment: CRITICAL RESULT CALLED TO, READ BACK BY AND VERIFIED WITH: E JACKSON PHARMD 2150 07/25/16 A BROWNING    Streptococcus pyogenes NOT DETECTED NOT DETECTED Final   Acinetobacter baumannii NOT DETECTED NOT DETECTED Final   Enterobacteriaceae species NOT DETECTED NOT DETECTED Final   Enterobacter cloacae complex NOT DETECTED NOT DETECTED Final   Escherichia coli NOT DETECTED NOT DETECTED Final   Klebsiella oxytoca NOT DETECTED NOT DETECTED Final   Klebsiella pneumoniae NOT DETECTED NOT DETECTED Final   Proteus species NOT DETECTED NOT DETECTED Final   Serratia marcescens NOT DETECTED NOT DETECTED Final   Haemophilus influenzae NOT DETECTED NOT DETECTED Final   Neisseria meningitidis NOT DETECTED NOT DETECTED Final   Pseudomonas aeruginosa NOT DETECTED NOT DETECTED Final   Candida albicans NOT DETECTED NOT DETECTED Final   Candida glabrata NOT DETECTED NOT DETECTED Final   Candida krusei NOT DETECTED NOT DETECTED Final   Candida parapsilosis NOT DETECTED NOT DETECTED  Final   Candida tropicalis NOT DETECTED NOT DETECTED Final    Comment: Performed at [facility], [place] [address], [place], [place] [address]  Blood culture (routine x 2)     Status: None (Preliminary result)   Collection Time: 07/28/16  8:53 PM  Result Value Ref Range Status   Specimen Description BLOOD RIGHT ARM  Final   Special Requests BOTTLES DRAWN AEROBIC AND ANAEROBIC 5CC EA  Final   Culture NO GROWTH < 24 HOURS  Final   Report Status PENDING  Incomplete  Blood culture (routine x 2)     Status: None (Preliminary result)   Collection Time: 07/28/16  8:59 PM  Result Value Ref Range Status   Specimen Description BLOOD RIGHT WRIST  Final   Special Requests BOTTLES DRAWN AEROBIC AND ANAEROBIC 5CC EA  Final   Culture NO GROWTH < 24 HOURS  Final   Report Status PENDING  Incomplete  MRSA PCR Screening     Status: None   Collection Time: 07/29/16  7:01 AM  Result Value Ref Range Status   MRSA by PCR NEGATIVE NEGATIVE Final    Comment:        The GeneXpert MRSA Assay (FDA approved for NASAL specimens only), is one component of a comprehensive MRSA colonization surveillance program. It is not intended to diagnose MRSA infection nor to guide or monitor treatment for MRSA infections.      Invalid input(s): PROCALCITONIN, LACTICACIDVEN   Radiology Studies: Dg Chest Port 1 View  Result Date: 07/28/2016 CLINICAL DATA:  Initial evaluation for acute cough, shortness of breath. EXAM: PORTABLE CHEST 1 VIEW COMPARISON:  Prior radiograph from 07/26/2016. FINDINGS: Cardiomegaly, stable from previous. Mediastinal silhouette within normal limits. Aortic atherosclerosis noted. Lungs normally inflated. Diffuse pulmonary vascular congestion with indistinctness of the interstitial markings suggesting  pulmonary edema. Small bilateral pleural effusions present. More confluent bibasilar opacities favored to reflect edema and/ or atelectasis, although infiltrates not excluded. No pneumothorax. No  acute osseous abnormality. Surgical clips overlie the left axilla. IMPRESSION: 1. Cardiomegaly with moderate diffuse pulmonary edema and small bilateral pleural effusions. 2. More confluent patchy bibasilar opacities, favored to reflect atelectasis/edema, although superimposed infiltrates could be considered in the correct clinical setting. 3. Aortic atherosclerosis. Electronically Signed   By: Jeannine Boga M.D.   On: 07/28/2016 21:32        Scheduled Meds: . anastrozole  1 mg Oral Daily  . busPIRone  10 mg Oral BID  . gabapentin  300 mg Oral TID  . hydrALAZINE  10 mg Oral Q8H  . nitroGLYCERIN  0.5 inch Topical Q6H  . oseltamivir  30 mg Oral BID  . potassium chloride  30 mEq Oral BID  . saccharomyces boulardii  250 mg Oral BID   Continuous Infusions: . diltiazem (CARDIZEM) infusion 10 mg/hr (07/30/16 0527)  . heparin 1,050 Units/hr (07/29/16 2000)     LOS: 2 days    Time spent: 35 minutes    Xochilth Standish A, MD [facility] Pager [phone]  If 7PM-7AM, please contact night-coverage [email or web address] Password [facility] 07/30/2016, 10:51 AM
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
## 2016-07-31 LAB — BASIC METABOLIC PANEL
Anion gap: 6 (ref 5–15)
BUN: 23 mg/dL — ABNORMAL HIGH (ref 6–20)
CHLORIDE: 105 mmol/L (ref 101–111)
CO2: 24 mmol/L (ref 22–32)
CREATININE: 1.03 mg/dL — AB (ref 0.44–1.00)
Calcium: 8.3 mg/dL — ABNORMAL LOW (ref 8.9–10.3)
GFR calc non Af Amer: 56 mL/min — ABNORMAL LOW (ref >60)
Glucose, Bld: 124 mg/dL — ABNORMAL HIGH (ref 65–99)
POTASSIUM: 3.9 mmol/L (ref 3.5–5.1)
Sodium: 135 mmol/L (ref 135–145)

## 2016-07-31 LAB — GLUCOSE, CAPILLARY: Glucose-Capillary: 119 mg/dL — ABNORMAL HIGH (ref 65–99)

## 2016-07-31 MED ORDER — DILTIAZEM HCL ER 90 MG PO CP12
90.0000 mg | ORAL_CAPSULE | Freq: Two times a day (BID) | ORAL | Status: DC
  Administered 2016-07-31 – 2016-08-01 (×3): 90 mg via ORAL
  Filled 2016-07-31 (×3): qty 1

## 2016-07-31 MED ORDER — ENOXAPARIN SODIUM 40 MG/0.4ML ~~LOC~~ SOLN
40.0000 mg | SUBCUTANEOUS | Status: DC
  Administered 2016-07-31 – 2016-08-01 (×2): 40 mg via SUBCUTANEOUS
  Filled 2016-07-31 (×2): qty 0.4

## 2016-07-31 MED ORDER — FUROSEMIDE 10 MG/ML IJ SOLN
20.0000 mg | Freq: Two times a day (BID) | INTRAMUSCULAR | Status: DC
Start: 2016-07-31 — End: 2016-08-01
  Administered 2016-07-31 – 2016-08-01 (×3): 20 mg via INTRAVENOUS
  Filled 2016-07-31 (×3): qty 2

## 2016-07-31 MED ORDER — DEXTROSE 5 % IV SOLN
2.0000 g | INTRAVENOUS | Status: DC
  Administered 2016-07-31 – 2016-08-01 (×2): 2 g via INTRAVENOUS
  Filled 2016-07-31 (×3): qty 2

## 2016-07-31 MED ORDER — POTASSIUM CHLORIDE CRYS ER 20 MEQ PO TBCR
40.0000 meq | EXTENDED_RELEASE_TABLET | Freq: Two times a day (BID) | ORAL | Status: DC
  Administered 2016-07-31 – 2016-08-02 (×4): 40 meq via ORAL
  Filled 2016-07-31 (×5): qty 2

## 2016-07-31 MED ORDER — DEXTROSE 5 % IV SOLN
1.0000 g | INTRAVENOUS | Status: DC
  Filled 2016-07-31: qty 10

## 2016-07-31 MED ORDER — CEFPODOXIME PROXETIL 200 MG PO TABS: 200.0000 mg | ORAL_TABLET | Freq: Two times a day (BID) | ORAL | Status: DC

## 2016-07-31 NOTE — Progress Notes (Signed)
PROGRESS NOTE  Victoria Burke  [id number] DOB: 12/08/1950 DOA: 07/28/2016 PCP: Dorena Dew, FNP Outpatient Specialists:  Subjective: -still has shortness of breath, remains on Cardizem drip, HR improving  Brief Narrative:  Victoria Burke is a 66 y.o. female with medical history significant for cancer of the left breast status post excision and radiation, COPD, cocaine abuse, anxiety, and hypertension who presented to the emergency department with acute onset of dyspnea. Patient was admitted to the hospital from 07/24/2016 - 07/27/2016 for management of acute exacerbation and COPD, precipitated by influenza A and likely secondary bacterial pneumonia with blood culture positive for strep pneumoniae. She reports feeling much improved by time of hospital discharge and reports waking today with no respiratory distress. Later in the day, after smoking cigarettes and cocaine, patient developed acute onset of severe shortness of breath while at rest. She denies any associated chest pain or palpitations and denies any lower extremity swelling or tenderness. She has continued to cough since the recent hospitalization, but reports no worsening in this. She denies fevers or chills, but endorses a general malaise.   Assessment & Plan:   MAT / Atrial tachycardia - Atrial arrhythmias noted on prior EKG's, but not a fib  - Not Afib per Cards, xarelto stopped - transition from Cardizem gtt to PO - Diltiazem infusion continued, titrated as needed for goal HR 60-105  - 2-D echo from 07/30/15 showed left atrial dilatation, LVEF is 65-70. - TSH is normal - Likely precipitated by cocaine use and/or pulmonary disease, avoid beta blockers for now.   Acute pulmonary edema, acute respiratory failure  -due to CHF and pneumonia -s/p BiPAP in ED  - There are crackles throughout on auscultation and CXR features cardiomegaly and diffuse pulmonary edema  - Likely secondary to rapid rate and/or uncontrolled HTN    - 2-D echo showed LVEF of 65-70% with mild LVH, cardiology eval -lasix IV today  Hypertension with hypertensive urgency  - BP 180/100 range on admission, improved with diltiazem infusion and Lasix   - Remains hypertensive and will be treated with hydralazine and NTG for now  - Norvasc held on admission as she is on diltiazem infusion    Cocaine abuse  - Pt endorses smoking cocaine shortly prior to symptom-onset  - Counseled her briefly; will avoid beta-blockers,   COPD  - No wheezing or obstructed breathing on admission  - Continue prn neb treatments.   Influenza A, strep pneumo bacteremia  - PCR positive for influenza A on 07/24/2016; she had been started on empiric Tamiflu earlier that day - She completed 5 days treatment on night of admission,will stop -also given recent strep pneumonia and bacteremia restarted Ceftriaxone and resume Vantin at discharge to ensure 2weeks of therapy  Hypokalemia, hypomagnesemia  -replaced  Breast cancer - Left breast cancer s/p resection and radiation  - Continue Arimidex as tolerated    Anxiety  - Appears calm after receiving Ativan in ED  - Continue Buspar, low-dose prn Ativan   DVT prophylaxis: lovenox Code Status: Full Code Family Communication: none at bedside Disposition Plan: Home in 2days if stable  Consultants:   Cards  Procedures:   None  Antimicrobials:   Ceftriaxone   Objective: Vitals:   07/30/16 2107 07/31/16 0040 07/31/16 0404 07/31/16 0848  BP: (!) 155/85 (!) 149/89 (!) 145/81 (!) 153/86  Pulse: 79 (!) 59 68 81  Resp: (!) 29 (!) 22 (!) 24 (!) 22  Temp: 98.4 F (36.9 C) 98.8 F (37.1 C)  97.7 F (36.5 C) 98.2 F (36.8 C)  TempSrc: Oral Oral Oral Oral  SpO2: 96% 96% 98% 97%  Weight:   52.8 kg (116 lb 4.8 oz)   Height:        Intake/Output Summary (Last 24 hours) at 07/31/16 1326 Last data filed at 07/30/16 2000  Gross per 24 hour  Intake            709.5 ml  Output                0 ml  Net             709.5 ml   Filed Weights   07/29/16 1246 07/30/16 0525 07/31/16 0404  Weight: 52.7 kg (116 lb 1.6 oz) 51.4 kg (113 lb 6.4 oz) 52.8 kg (116 lb 4.8 oz)    Examination: General exam: AAOx3, mild distress Respiratory system: few basilar crackles Cardiovascular system: S1 & S2 heard, RRR. No JVD, murmurs, rubs, gallops or clicks. No pedal edema. Gastrointestinal system: Abdomen is nondistended, soft and nontender. No organomegaly or masses felt. Normal bowel sounds heard. Central nervous system: Alert and oriented. No focal neurological deficits. Extremities: Symmetric 5 x 5 power. Skin: No rashes, lesions or ulcers Psychiatry: Judgement and insight appear normal. Mood & affect appropriate.   Data Reviewed: I have personally reviewed following labs and imaging studies  CBC:  Recent Labs Lab 07/24/16 2030 07/27/16 0538 07/28/16 2038 07/30/16 0428  WBC 10.0 13.3* 9.8 5.6  NEUTROABS 8.7*  --  8.5*  --   HGB 13.9 12.2 13.4 11.9*  HCT 41.2 37.8 40.7 36.8  MCV 86.6 90.0 88.9 88.5  PLT 388 361 363 [id number]   Basic Metabolic Panel:  Recent Labs Lab 07/26/16 0501 07/27/16 0538 07/28/16 2038 07/29/16 0543 07/30/16 0428 07/31/16 0455  NA 136 135 134* 136 135 135  K 3.9 4.2 3.2* 3.6 3.5 3.9  CL 107 108 103 106 103 105  CO2 21* 19* 21* 22 25 24   GLUCOSE 140* 194* 119* 109* 99 124*  BUN 28* 32* 19 20 21* 23*  CREATININE 1.22* 1.06* 0.98 0.91 1.09* 1.03*  CALCIUM 8.3* 8.4* 9.1 8.3* 8.2* 8.3*  MG 1.7  --  1.5* 2.3  --   --    GFR: Estimated Creatinine Clearance: 41.6 mL/min (by C-G formula based on SCr of 1.03 mg/dL (H)). Liver Function Tests:  Recent Labs Lab 07/28/16 2038  AST 30  ALT 22  ALKPHOS 77  BILITOT 0.4  PROT 7.5  ALBUMIN 2.3*   No results for input(s): LIPASE, AMYLASE in the last 168 hours. No results for input(s): AMMONIA in the last 168 hours. Coagulation Profile:  Recent Labs Lab 07/24/16 2330  INR 1.02   Cardiac Enzymes:  Recent Labs Lab  07/29/16 0001 07/29/16 0543 07/29/16 1240  TROPONINI 0.11* 0.09* 0.06*   BNP (last 3 results) No results for input(s): PROBNP in the last 8760 hours. HbA1C: No results for input(s): HGBA1C in the last 72 hours. CBG:  Recent Labs Lab 07/25/16 1136 07/26/16 0732 07/27/16 0718  GLUCAP 171* 155* 130*   Lipid Profile: No results for input(s): CHOL, HDL, LDLCALC, TRIG, CHOLHDL, LDLDIRECT in the last 72 hours. Thyroid Function Tests:  Recent Labs  07/29/16 0001  TSH 2.167  FREET4 1.04   Anemia Panel: No results for input(s): VITAMINB12, FOLATE, FERRITIN, TIBC, IRON, RETICCTPCT in the last 72 hours. Urine analysis:    Component Value Date/Time   COLORURINE YELLOW 07/24/2016 2100   Golva  07/24/2016 2100   LABSPEC 1.013 07/24/2016 2100   PHURINE 5.0 07/24/2016 2100   GLUCOSEU NEGATIVE 07/24/2016 2100   HGBUR SMALL (A) 07/24/2016 2100   BILIRUBINUR NEGATIVE 07/24/2016 2100   KETONESUR NEGATIVE 07/24/2016 2100   PROTEINUR >=300 (A) 07/24/2016 2100   UROBILINOGEN 0.2 06/25/2016 1507   NITRITE NEGATIVE 07/24/2016 2100   LEUKOCYTESUR NEGATIVE 07/24/2016 2100   Sepsis Labs: @LABRCNTIP (procalcitonin:4,lacticidven:4)  ) Recent Results (from the past 240 hour(s))  Urine culture     Status: Abnormal   Collection Time: 07/24/16  9:00 PM  Result Value Ref Range Status   Specimen Description URINE, CLEAN CATCH  Final   Special Requests NONE  Final   Culture (A)  Final    <10,000 COLONIES/mL INSIGNIFICANT GROWTH Performed at [facility], [address] [place], [place] [address]    Report Status 07/26/2016 FINAL  Final  Blood Culture (routine x 2)     Status: None   Collection Time: 07/24/16  9:20 PM  Result Value Ref Range Status   Specimen Description RIGHT ANTECUBITAL  Final   Special Requests BOTTLES DRAWN AEROBIC AND ANAEROBIC 5CC  Final   Culture   Final    NO GROWTH 5 DAYS Performed at [facility], [place] [address], [place],   [address]    Report Status 07/30/2016 FINAL  Final  Blood Culture (routine x 2)     Status: Abnormal   Collection Time: 07/24/16  9:22 PM  Result Value Ref Range Status   Specimen Description PORTA CATH  Final   Special Requests BOTTLES DRAWN AEROBIC AND ANAEROBIC 5CC  Final   Culture  Setup Time   Final    GRAM POSITIVE COCCI IN CHAINS IN PAIRS ANAEROBIC BOTTLE ONLY CRITICAL RESULT CALLED TO, READ BACK BY AND VERIFIED WITHSeleta Rhymes [facility] 2150 07/25/16 A BROWNING Performed at [facility], [place] [address] [place],  [address]    Culture STREPTOCOCCUS PNEUMONIAE (A)  Final   Report Status 07/27/2016 FINAL  Final   Organism ID, Bacteria STREPTOCOCCUS PNEUMONIAE  Final      Susceptibility   Streptococcus pneumoniae - MIC*    ERYTHROMYCIN <=0.12 SENSITIVE Sensitive     LEVOFLOXACIN 1 SENSITIVE Sensitive     PENICILLIN <=0.06 SENSITIVE Sensitive     CEFTRIAXONE <=0.12 SENSITIVE Sensitive     * STREPTOCOCCUS PNEUMONIAE  Blood Culture ID Panel (Reflexed)     Status: Abnormal   Collection Time: 07/24/16  9:22 PM  Result Value Ref Range Status   Enterococcus species NOT DETECTED NOT DETECTED Final   Listeria monocytogenes NOT DETECTED NOT DETECTED Final   Staphylococcus species NOT DETECTED NOT DETECTED Final   Staphylococcus aureus NOT DETECTED NOT DETECTED Final   Streptococcus species DETECTED (A) NOT DETECTED Final    Comment: CRITICAL RESULT CALLED TO, READ BACK BY AND VERIFIED WITH: E JACKSON PHARMD 2150 07/25/16 A BROWNING    Streptococcus agalactiae NOT DETECTED NOT DETECTED Final   Streptococcus pneumoniae DETECTED (A) NOT DETECTED Final    Comment: CRITICAL RESULT CALLED TO, READ BACK BY AND VERIFIED WITH: E JACKSON PHARMD 2150 07/25/16 A BROWNING    Streptococcus pyogenes NOT DETECTED NOT DETECTED Final   Acinetobacter baumannii NOT DETECTED NOT DETECTED Final   Enterobacteriaceae species NOT DETECTED NOT DETECTED Final   Enterobacter cloacae complex NOT DETECTED  NOT DETECTED Final   Escherichia coli NOT DETECTED NOT DETECTED Final   Klebsiella oxytoca NOT DETECTED NOT DETECTED Final   Klebsiella  pneumoniae NOT DETECTED NOT DETECTED Final   Proteus species NOT DETECTED NOT DETECTED Final   Serratia marcescens NOT DETECTED NOT DETECTED Final   Haemophilus influenzae NOT DETECTED NOT DETECTED Final   Neisseria meningitidis NOT DETECTED NOT DETECTED Final   Pseudomonas aeruginosa NOT DETECTED NOT DETECTED Final   Candida albicans NOT DETECTED NOT DETECTED Final   Candida glabrata NOT DETECTED NOT DETECTED Final   Candida krusei NOT DETECTED NOT DETECTED Final   Candida parapsilosis NOT DETECTED NOT DETECTED Final   Candida tropicalis NOT DETECTED NOT DETECTED Final    Comment: Performed at [facility], [place] [address], [place], [place] [address]  Blood culture (routine x 2)     Status: None (Preliminary result)   Collection Time: 07/28/16  8:53 PM  Result Value Ref Range Status   Specimen Description BLOOD RIGHT ARM  Final   Special Requests BOTTLES DRAWN AEROBIC AND ANAEROBIC 5CC EA  Final   Culture NO GROWTH 2 DAYS  Final   Report Status PENDING  Incomplete  Blood culture (routine x 2)     Status: None (Preliminary result)   Collection Time: 07/28/16  8:59 PM  Result Value Ref Range Status   Specimen Description BLOOD RIGHT WRIST  Final   Special Requests BOTTLES DRAWN AEROBIC AND ANAEROBIC 5CC EA  Final   Culture NO GROWTH 2 DAYS  Final   Report Status PENDING  Incomplete  MRSA PCR Screening     Status: None   Collection Time: 07/29/16  7:01 AM  Result Value Ref Range Status   MRSA by PCR NEGATIVE NEGATIVE Final    Comment:        The GeneXpert MRSA Assay (FDA approved for NASAL specimens only), is one component of a comprehensive MRSA colonization surveillance program. It is not intended to diagnose MRSA infection nor to guide or monitor treatment for MRSA infections.      Invalid input(s): PROCALCITONIN,  LACTICACIDVEN   Radiology Studies: Dg Chest Port 1v Same Day  Result Date: 07/30/2016 CLINICAL DATA:  Shortness of breath and weakness. Flu-like symptoms. EXAM: PORTABLE CHEST 1 VIEW COMPARISON:  07/28/2016 and 07/26/2016 FINDINGS: There are patchy parenchymal densities in the mid and lower lungs bilaterally. Cannot exclude small pleural effusions, particularly at the left lung base. Atherosclerotic calcifications at the aortic arch. Heart size is within normal limits. IMPRESSION: Bilateral patchy parenchymal lung densities. Findings are concerning for bilateral pneumonia. Recommend follow up to ensure resolution. Electronically Signed   By: Markus Daft M.D.   On: 07/30/2016 12:05        Scheduled Meds: . anastrozole  1 mg Oral Daily  . busPIRone  10 mg Oral BID  . cefTRIAXone (ROCEPHIN)  IV  2 g Intravenous Q24H  . diltiazem  90 mg Oral Q12H  . enoxaparin (LOVENOX) injection  40 mg Subcutaneous Q24H  . furosemide  20 mg Intravenous Q12H  . gabapentin  300 mg Oral TID  . hydrALAZINE  10 mg Oral Q8H  . nitroGLYCERIN  0.5 inch Topical Q6H  . potassium chloride  40 mEq Oral BID  . saccharomyces boulardii  250 mg Oral BID   Continuous Infusions:    LOS: 3 days    Time spent: 38 minutes    Domenic Polite, MD [facility] Pager [phone]  If 7PM-7AM, please contact night-coverage [email or web address] Password [email or web address] 07/31/2016, 1:26 PM

## 2016-07-31 NOTE — Care Management (Addendum)
1636 07-31-16  Benefits Check completed for Xarelto. Patient has Medicaid and co pay will be $3.70 for each Rx. Pt uses [facility]. Pt states she will need additional resources for Drug Rehab. Currently uses [facility] and is in a [facility] that they provide for patient's enrolled in the rehab program. CM did provide pt with 30 day free Xarelto Card. CM will continue to monitor for additional needs.

## 2016-08-01 LAB — BASIC METABOLIC PANEL
Anion gap: 5 (ref 5–15)
BUN: 18 mg/dL (ref 6–20)
CHLORIDE: 106 mmol/L (ref 101–111)
CO2: 26 mmol/L (ref 22–32)
Calcium: 9.1 mg/dL (ref 8.9–10.3)
Creatinine, Ser: 1.03 mg/dL — ABNORMAL HIGH (ref 0.44–1.00)
GFR calc non Af Amer: 56 mL/min — ABNORMAL LOW (ref >60)
Glucose, Bld: 132 mg/dL — ABNORMAL HIGH (ref 65–99)
POTASSIUM: 5 mmol/L (ref 3.5–5.1)
SODIUM: 137 mmol/L (ref 135–145)

## 2016-08-01 LAB — CBC
HEMATOCRIT: 39.7 % (ref 36.0–46.0)
Hemoglobin: 12.8 g/dL (ref 12.0–15.0)
MCH: 29.3 pg (ref 26.0–34.0)
MCHC: 32.2 g/dL (ref 30.0–36.0)
MCV: 90.8 fL (ref 78.0–100.0)
Platelets: 353 10*3/uL (ref 150–400)
RBC: 4.37 MIL/uL (ref 3.87–5.11)
RDW: 14.8 % (ref 11.5–15.5)
WBC: 6.2 10*3/uL (ref 4.0–10.5)

## 2016-08-01 MED ORDER — BACITRACIN-NEOMYCIN-POLYMYXIN OINTMENT TUBE
TOPICAL_OINTMENT | Freq: Two times a day (BID) | CUTANEOUS | Status: DC | PRN
  Administered 2016-08-02: 12:00:00 via TOPICAL
  Filled 2016-08-01 (×2): qty 14.17

## 2016-08-01 MED ORDER — FUROSEMIDE 20 MG PO TABS
20.0000 mg | ORAL_TABLET | Freq: Every day | ORAL | Status: DC
Start: 2016-08-02 — End: 2016-08-02
  Administered 2016-08-02: 20 mg via ORAL
  Filled 2016-08-01: qty 1

## 2016-08-01 MED ORDER — DILTIAZEM HCL ER COATED BEADS 180 MG PO CP24
180.0000 mg | ORAL_CAPSULE | Freq: Every day | ORAL | Status: DC
  Administered 2016-08-01 – 2016-08-02 (×2): 180 mg via ORAL
  Filled 2016-08-01 (×2): qty 1

## 2016-08-01 NOTE — Progress Notes (Signed)
PROGRESS NOTE  Victoria Burke  [id number] DOB: [birth year] DOA: 07/28/2016 PCP: Dorena Dew, FNP Outpatient Specialists:  Subjective: -still has shortness of breath, remains on Cardizem drip, HR improving  Brief Narrative:  Victoria Burke is a 66 y.o. female with medical history significant for cancer of the left breast status post excision and radiation, COPD, cocaine abuse, anxiety, and hypertension who presented to the emergency department with acute onset of dyspnea. Patient was admitted to the hospital from 07/24/2016 - 07/27/2016 for management of acute exacerbation and COPD, precipitated by influenza A and likely secondary bacterial pneumonia with blood culture positive for strep pneumoniae. She reports feeling much improved by time of hospital discharge and reports waking today with no respiratory distress. Later in the day, after smoking cigarettes and cocaine, patient developed acute onset of severe shortness of breath while at rest.  Then found top have Resp failrue with MAT/Pulm edema   Assessment & Plan:   MAT / Atrial tachycardia - Atrial arrhythmias noted on prior EKG's, but not a fib  - Not Afib per Cards, xarelto stopped - transition to Cardizem CD - Diltiazem infusion continued, titrated as needed for goal HR 60-105  - 2-D echo from 07/30/15 showed left atrial dilatation, LVEF is 65-70. - TSH is normal - Likely precipitated by cocaine use and/or pulmonary disease, avoid beta blockers for now.   Acute pulmonary edema, acute respiratory failure  -due to CHF and pneumonia -s/p BiPAP in ED  - 2-D echo showed LVEF of 65-70% with mild LVH,  -change lasix to PO today  Hypertension with hypertensive urgency  - BP 180/100 range on admission, improved with diltiazem infusion and Lasix   - stable, improved, norvasc stopped  Cocaine abuse  - Pt endorses smoking cocaine shortly prior to symptom-onset  - Counseled her briefly; will avoid beta-blockers,   COPD  -  No wheezing or obstructed breathing on admission  - Continue prn neb treatments.   Influenza A, strep pneumo bacteremia  - PCR positive for influenza A on 07/24/2016; she had been started on empiric Tamiflu earlier that day - She completed 5 days treatment on night of admission,will stop -also given recent strep pneumonia and bacteremia restarted Ceftriaxone and resume Vantin at discharge to ensure 2weeks of therapy  Hypokalemia, hypomagnesemia  -replaced  Breast cancer - Left breast cancer s/p resection and radiation  - Continue Arimidex as tolerated    Anxiety  - Appears calm after receiving Ativan in ED  - Continue Buspar, low-dose prn Ativan   DVT prophylaxis: lovenox Code Status: Full Code Family Communication: none at bedside Disposition Plan: Home tomorrow if stable  Consultants:   Cards  Procedures:   None  Antimicrobials:   Ceftriaxone   Objective: Vitals:   08/01/16 0044 08/01/16 0521 08/01/16 0915 08/01/16 1133  BP: (!) 157/82 140/76 (!) 156/99 (!) 153/88  Pulse: (!) 50 (!) 52 81 77  Resp: (!) 26 (!) 22 20   Temp: 98.3 F (36.8 C) 98 F (36.7 C) 97.9 F (36.6 C) 98.6 F (37 C)  TempSrc: Oral Oral Oral Oral  SpO2: 95% 97% 97% 97%  Weight:  50.8 kg (111 lb 14.4 oz)    Height:        Intake/Output Summary (Last 24 hours) at 08/01/16 1156 Last data filed at 08/01/16 1059  Gross per 24 hour  Intake           485.33 ml  Output  1600 ml  Net         -1114.67 ml   Filed Weights   07/30/16 0525 07/31/16 0404 08/01/16 0521  Weight: 51.4 kg (113 lb 6.4 oz) 52.8 kg (116 lb 4.8 oz) 50.8 kg (111 lb 14.4 oz)    Examination: General exam: AAOx3, mild distress Respiratory system: few basilar crackles, improving Cardiovascular system: S1 & S2 heard, RRR. No JVD, murmurs, rubs, gallops or clicks. No pedal edema. Gastrointestinal system: Abdomen is nondistended, soft and nontender. No organomegaly or masses felt. Normal bowel sounds  heard. Central nervous system: Alert and oriented. No focal neurological deficits. Extremities: Symmetric 5 x 5 power. Skin: No rashes, lesions or ulcers Psychiatry: Judgement and insight appear normal. Mood & affect appropriate.   Data Reviewed: I have personally reviewed following labs and imaging studies  CBC:  Recent Labs Lab 07/27/16 0538 07/28/16 2038 07/30/16 0428 08/01/16 0508  WBC 13.3* 9.8 5.6 6.2  NEUTROABS  --  8.5*  --   --   HGB 12.2 13.4 11.9* 12.8  HCT 37.8 40.7 36.8 39.7  MCV 90.0 88.9 88.5 90.8  PLT 361 363 299 0000000   Basic Metabolic Panel:  Recent Labs Lab 07/26/16 0501  07/28/16 2038 07/29/16 0543 07/30/16 0428 07/31/16 0455 08/01/16 0508  NA 136  < > 134* 136 135 135 137  K 3.9  < > 3.2* 3.6 3.5 3.9 5.0  CL 107  < > 103 106 103 105 106  CO2 21*  < > 21* 22 25 24 26   GLUCOSE 140*  < > 119* 109* 99 124* 132*  BUN 28*  < > 19 20 21* 23* 18  CREATININE 1.22*  < > 0.98 0.91 1.09* 1.03* 1.03*  CALCIUM 8.3*  < > 9.1 8.3* 8.2* 8.3* 9.1  MG 1.7  --  1.5* 2.3  --   --   --   < > = values in this interval not displayed. GFR: Estimated Creatinine Clearance: 41.6 mL/min (by C-G formula based on SCr of 1.03 mg/dL (H)). Liver Function Tests:  Recent Labs Lab 07/28/16 2038  AST 30  ALT 22  ALKPHOS 77  BILITOT 0.4  PROT 7.5  ALBUMIN 2.3*   No results for input(s): LIPASE, AMYLASE in the last 168 hours. No results for input(s): AMMONIA in the last 168 hours. Coagulation Profile: No results for input(s): INR, PROTIME in the last 168 hours. Cardiac Enzymes:  Recent Labs Lab 07/29/16 0001 07/29/16 0543 07/29/16 1240  TROPONINI 0.11* 0.09* 0.06*   BNP (last 3 results) No results for input(s): PROBNP in the last 8760 hours. HbA1C: No results for input(s): HGBA1C in the last 72 hours. CBG:  Recent Labs Lab 07/26/16 0732 07/27/16 0718 07/31/16 1616  GLUCAP 155* 130* 119*   Lipid Profile: No results for input(s): CHOL, HDL, LDLCALC, TRIG,  CHOLHDL, LDLDIRECT in the last 72 hours. Thyroid Function Tests: No results for input(s): TSH, T4TOTAL, FREET4, T3FREE, THYROIDAB in the last 72 hours. Anemia Panel: No results for input(s): VITAMINB12, FOLATE, FERRITIN, TIBC, IRON, RETICCTPCT in the last 72 hours. Urine analysis:    Component Value Date/Time   COLORURINE YELLOW 07/24/2016 2100   APPEARANCEUR CLEAR 07/24/2016 2100   LABSPEC 1.013 07/24/2016 2100   PHURINE 5.0 07/24/2016 2100   GLUCOSEU NEGATIVE 07/24/2016 2100   HGBUR SMALL (A) 07/24/2016 2100   Bell NEGATIVE 07/24/2016 2100   KETONESUR NEGATIVE 07/24/2016 2100   PROTEINUR >=300 (A) 07/24/2016 2100   UROBILINOGEN 0.2 06/25/2016 1507   NITRITE  NEGATIVE 07/24/2016 2100   LEUKOCYTESUR NEGATIVE 07/24/2016 2100   Sepsis Labs: @LABRCNTIP (procalcitonin:4,lacticidven:4)  ) Recent Results (from the past 240 hour(s))  Urine culture     Status: Abnormal   Collection Time: 07/24/16  9:00 PM  Result Value Ref Range Status   Specimen Description URINE, CLEAN CATCH  Final   Special Requests NONE  Final   Culture (A)  Final    <10,000 COLONIES/mL INSIGNIFICANT GROWTH Performed at [facility], [address], [place], [place] [address]    Report Status 07/26/2016 FINAL  Final  Blood Culture (routine x 2)     Status: None   Collection Time: 07/24/16  9:20 PM  Result Value Ref Range Status   Specimen Description RIGHT ANTECUBITAL  Final   Special Requests BOTTLES DRAWN AEROBIC AND ANAEROBIC 5CC  Final   Culture   Final    NO GROWTH 5 DAYS Performed at [facility], [place] [address], [place], [place] [address]    Report Status 07/30/2016 FINAL  Final  Blood Culture (routine x 2)     Status: Abnormal   Collection Time: 07/24/16  9:22 PM  Result Value Ref Range Status   Specimen Description PORTA CATH  Final   Special Requests BOTTLES DRAWN AEROBIC AND ANAEROBIC 5CC  Final   Culture  Setup Time   Final    GRAM POSITIVE COCCI IN CHAINS IN  PAIRS ANAEROBIC BOTTLE ONLY CRITICAL RESULT CALLED TO, READ BACK BY AND VERIFIED WITHSeleta Rhymes [facility] 2150 07/25/16 A BROWNING Performed at [facility], [place] [address], [place], [place] [address]    Culture STREPTOCOCCUS PNEUMONIAE (A)  Final   Report Status 07/27/2016 FINAL  Final   Organism ID, Bacteria STREPTOCOCCUS PNEUMONIAE  Final      Susceptibility   Streptococcus pneumoniae - MIC*    ERYTHROMYCIN <=0.12 SENSITIVE Sensitive     LEVOFLOXACIN 1 SENSITIVE Sensitive     PENICILLIN <=0.06 SENSITIVE Sensitive     CEFTRIAXONE <=0.12 SENSITIVE Sensitive     * STREPTOCOCCUS PNEUMONIAE  Blood Culture ID Panel (Reflexed)     Status: Abnormal   Collection Time: 07/24/16  9:22 PM  Result Value Ref Range Status   Enterococcus species NOT DETECTED NOT DETECTED Final   Listeria monocytogenes NOT DETECTED NOT DETECTED Final   Staphylococcus species NOT DETECTED NOT DETECTED Final   Staphylococcus aureus NOT DETECTED NOT DETECTED Final   Streptococcus species DETECTED (A) NOT DETECTED Final    Comment: CRITICAL RESULT CALLED TO, READ BACK BY AND VERIFIED WITH: E JACKSON PHARMD 2150 07/25/16 A BROWNING    Streptococcus agalactiae NOT DETECTED NOT DETECTED Final   Streptococcus pneumoniae DETECTED (A) NOT DETECTED Final    Comment: CRITICAL RESULT CALLED TO, READ BACK BY AND VERIFIED WITH: E JACKSON PHARMD 2150 07/25/16 A BROWNING    Streptococcus pyogenes NOT DETECTED NOT DETECTED Final   Acinetobacter baumannii NOT DETECTED NOT DETECTED Final   Enterobacteriaceae species NOT DETECTED NOT DETECTED Final   Enterobacter cloacae complex NOT DETECTED NOT DETECTED Final   Escherichia coli NOT DETECTED NOT DETECTED Final   Klebsiella oxytoca NOT DETECTED NOT DETECTED Final   Klebsiella pneumoniae NOT DETECTED NOT DETECTED Final   Proteus species NOT DETECTED NOT DETECTED Final   Serratia marcescens NOT DETECTED NOT DETECTED Final   Haemophilus influenzae NOT DETECTED NOT DETECTED Final    Neisseria meningitidis NOT DETECTED NOT DETECTED Final   Pseudomonas aeruginosa NOT DETECTED NOT DETECTED Final   Candida albicans  NOT DETECTED NOT DETECTED Final   Candida glabrata NOT DETECTED NOT DETECTED Final   Candida krusei NOT DETECTED NOT DETECTED Final   Candida parapsilosis NOT DETECTED NOT DETECTED Final   Candida tropicalis NOT DETECTED NOT DETECTED Final    Comment: Performed at [facility], [place] [address], [place], [place] [address]  Blood culture (routine x 2)     Status: None (Preliminary result)   Collection Time: 07/28/16  8:53 PM  Result Value Ref Range Status   Specimen Description BLOOD RIGHT ARM  Final   Special Requests BOTTLES DRAWN AEROBIC AND ANAEROBIC 5CC EA  Final   Culture NO GROWTH 3 DAYS  Final   Report Status PENDING  Incomplete  Blood culture (routine x 2)     Status: None (Preliminary result)   Collection Time: 07/28/16  8:59 PM  Result Value Ref Range Status   Specimen Description BLOOD RIGHT WRIST  Final   Special Requests BOTTLES DRAWN AEROBIC AND ANAEROBIC 5CC EA  Final   Culture NO GROWTH 3 DAYS  Final   Report Status PENDING  Incomplete  MRSA PCR Screening     Status: None   Collection Time: 07/29/16  7:01 AM  Result Value Ref Range Status   MRSA by PCR NEGATIVE NEGATIVE Final    Comment:        The GeneXpert MRSA Assay (FDA approved for NASAL specimens only), is one component of a comprehensive MRSA colonization surveillance program. It is not intended to diagnose MRSA infection nor to guide or monitor treatment for MRSA infections.      Invalid input(s): PROCALCITONIN, LACTICACIDVEN   Radiology Studies: Dg Chest Port 1v Same Day  Result Date: 07/30/2016 CLINICAL DATA:  Shortness of breath and weakness. Flu-like symptoms. EXAM: PORTABLE CHEST 1 VIEW COMPARISON:  07/28/2016 and 07/26/2016 FINDINGS: There are patchy parenchymal densities in the mid and lower lungs bilaterally. Cannot exclude small pleural effusions,  particularly at the left lung base. Atherosclerotic calcifications at the aortic arch. Heart size is within normal limits. IMPRESSION: Bilateral patchy parenchymal lung densities. Findings are concerning for bilateral pneumonia. Recommend follow up to ensure resolution. Electronically Signed   By: Markus Daft M.D.   On: 07/30/2016 12:05        Scheduled Meds: . anastrozole  1 mg Oral Daily  . busPIRone  10 mg Oral BID  . cefTRIAXone (ROCEPHIN)  IV  2 g Intravenous Q24H  . diltiazem  90 mg Oral Q12H  . enoxaparin (LOVENOX) injection  40 mg Subcutaneous Q24H  . furosemide  20 mg Intravenous Q12H  . gabapentin  300 mg Oral TID  . hydrALAZINE  10 mg Oral Q8H  . nitroGLYCERIN  0.5 inch Topical Q6H  . potassium chloride  40 mEq Oral BID  . saccharomyces boulardii  250 mg Oral BID   Continuous Infusions:    LOS: 4 days    Time spent: 34 minutes    Domenic Polite, MD [facility] Pager [phone]  If 7PM-7AM, please contact night-coverage [email or web address] Password [facility] 08/01/2016, 11:56 AM

## 2016-08-02 LAB — CULTURE, BLOOD (ROUTINE X 2)
CULTURE: NO GROWTH
Culture: NO GROWTH

## 2016-08-02 LAB — BASIC METABOLIC PANEL
Anion gap: 9 (ref 5–15)
BUN: 18 mg/dL (ref 6–20)
CALCIUM: 9 mg/dL (ref 8.9–10.3)
CO2: 24 mmol/L (ref 22–32)
Chloride: 104 mmol/L (ref 101–111)
Creatinine, Ser: 1.03 mg/dL — ABNORMAL HIGH (ref 0.44–1.00)
GFR calc Af Amer: >60 mL/min (ref >60)
GFR calc non Af Amer: 56 mL/min — ABNORMAL LOW (ref >60)
GLUCOSE: 86 mg/dL (ref 65–99)
Potassium: 4.8 mmol/L (ref 3.5–5.1)
Sodium: 137 mmol/L (ref 135–145)

## 2016-08-02 LAB — GLUCOSE, CAPILLARY: Glucose-Capillary: 117 mg/dL — ABNORMAL HIGH (ref 65–99)

## 2016-08-02 MED ORDER — FUROSEMIDE 20 MG PO TABS: 20.0000 mg | ORAL_TABLET | Freq: Every day | ORAL | 0 refills | Status: DC

## 2016-08-02 MED ORDER — CEFPODOXIME PROXETIL 200 MG PO TABS
200.0000 mg | ORAL_TABLET | Freq: Two times a day (BID) | ORAL | Status: DC
  Administered 2016-08-02: 200 mg via ORAL
  Filled 2016-08-02: qty 1

## 2016-08-02 MED ORDER — DILTIAZEM HCL ER COATED BEADS 180 MG PO CP24: 180.0000 mg | ORAL_CAPSULE | Freq: Every day | ORAL | 1 refills | Status: DC

## 2016-08-02 NOTE — Progress Notes (Signed)
OT Cancellation Note  Patient Details Name: Victoria Burke MRN: [id number] DOB: 02/14/1951   Cancelled Treatment:    Reason Eval/Treat Not Completed: OT screened, no needs identified, will sign off. Per PT, pt independent with no acute OT needs identified. Will screen and sign off at this time. Please re-consult if needs change. Thank you for this referral.  Binnie Kand M.S., OTR/L Pager: [phone]  08/02/2016, 10:24 AM

## 2016-08-02 NOTE — Evaluation (Signed)
Physical Therapy Evaluation Patient Details Name: Victoria Burke MRN: [id number] DOB: 1951-05-28 Today's Date: 08/02/2016   History of Present Illness  66 y.o. female with medical history significant for cancer of the left breast status post excision and radiation, COPD, cocaine abuse, anxiety, and hypertension who presents to the emergency department with acute onset of dyspnea. Patient was admitted to the hospital from 07/24/2016 - 07/27/2016 for management of acute exacerbation and COPD, precipitated by influenza A and likely secondary bacterial pneumonia with blood culture positive for strep pneumoniae. Dx of acute respiratory failure, a fib.   Clinical Impression  Pt is independent with mobility. She ambulated 150' without an assistive device, no loss of balance. SaO2 92% on RA, 2/4 dyspnea with walking. From PT standpoint, she is ready to DC home. No further PT indicated, will sign off.     Follow Up Recommendations No PT follow up    Equipment Recommendations  None recommended by PT    Recommendations for Other Services       Precautions / Restrictions Precautions Precautions: None Precaution Comments: pt denies falls in past 1 year Restrictions Weight Bearing Restrictions: No      Mobility  Bed Mobility Overal bed mobility: Independent                Transfers Overall transfer level: Independent                  Ambulation/Gait Ambulation/Gait assistance: Independent Ambulation Distance (Feet): 150 Feet Assistive device: None Gait Pattern/deviations: WFL(Within Functional Limits)   Gait velocity interpretation: at or above normal speed for age/gender General Gait Details: steady, no LOB, SaO2 92% on RA, 2/4 dyspnea  Stairs            Wheelchair Mobility    Modified Rankin (Stroke Patients Only)       Balance Overall balance assessment: No apparent balance deficits (not formally assessed)                                           Pertinent Vitals/Pain Pain Assessment: No/denies pain    Home Living Family/patient expects to be discharged to:: Other (Comment) (hotel) Living Arrangements: Other (Comment) (roomate)                    Prior Function Level of Independence: Independent with assistive device(s)         Comments: uses cane prn when her hip is bothering her, she's not had chronic hip pain evaluated; independent bathing/dressing     Hand Dominance        Extremity/Trunk Assessment   Upper Extremity Assessment Upper Extremity Assessment: Overall WFL for tasks assessed    Lower Extremity Assessment Lower Extremity Assessment: Overall WFL for tasks assessed    Cervical / Trunk Assessment Cervical / Trunk Assessment: Normal  Communication   Communication: No difficulties  Cognition Arousal/Alertness: Awake/alert Behavior During Therapy: WFL for tasks assessed/performed Overall Cognitive Status: Within Functional Limits for tasks assessed                      General Comments      Exercises     Assessment/Plan    PT Assessment Patent does not need any further PT services  PT Problem List            PT Treatment Interventions  PT Goals (Current goals can be found in the Care Plan section)  Acute Rehab PT Goals Patient Stated Goal: wants to talk to social worker about finding a different living situation PT Goal Formulation: All assessment and education complete, DC therapy    Frequency     Barriers to discharge        Co-evaluation               End of Session Equipment Utilized During Treatment: Gait belt Activity Tolerance: Patient tolerated treatment well Patient left: in bed;with call bell/phone within reach Nurse Communication: Mobility status         Time: 1003-1017 PT Time Calculation (min) (ACUTE ONLY): 14 min   Charges:   PT Evaluation $PT Eval Low Complexity: 1 Procedure     PT G Codes:        Victoria Burke 08/02/2016, 10:27 AM [phone]

## 2016-08-02 NOTE — Discharge Summary (Signed)
Physician Discharge Summary  Victoria Burke [id number] DOB: 12/31/1950 DOA: 07/28/2016  PCP: Dorena Dew, FNP  Admit date: 07/28/2016 Discharge date: 08/02/2016  Time spent: 35 minutes  Recommendations for Outpatient Follow-up:  1. PCP Cammie Sickle NP in [place]   Discharge Diagnoses:  Principal Problem:   Atrial tachycardia   MAT   Acute hypoxic resp failure   Breast cancer of upper-outer quadrant of left female breast (HCC)   Hypokalemia   Acute pulmonary edema (HCC)   Cocaine abuse   Chronic obstructive pulmonary disease (HCC)   Essential hypertension   Generalized anxiety disorder   Acute respiratory failure (HCC)   Hypomagnesemia   Recent Bacteremia due to Streptococcus pneumoniae   Discharge Condition: stable  Diet recommendation: low sodium heart healthy  Filed Weights   07/31/16 0404 08/01/16 0521 08/02/16 0526  Weight: 52.8 kg (116 lb 4.8 oz) 50.8 kg (111 lb 14.4 oz) 49.9 kg (110 lb 1.6 oz)    History of present illness:  Victoria Burke a 66 y.o.femalewith medical history significant for cancer of the left breast status post excision and radiation, COPD, cocaine abuse, anxiety, and hypertension who presented to the emergency department with acute onset of dyspnea. Patient was admitted to the hospital from 07/24/2016 - 07/27/2016 for management of acute exacerbation and COPD, precipitated by influenza Aand likely secondary bacterial pneumonia with blood culture positive for strep pneumoniae. She reports feeling much improved by time of hospital discharge and reports waking todaywith no respiratory distress. Later in the day, after smoking cigarettes and cocaine, patient developed acute onset of severe shortness of breath while at rest.  Then found top have Resp failure with MAT/Pulm edema   Hospital Course:  MAT / Atrial tachycardia - Atrial arrhythmias noted on prior EKG's, but not a fib  - Not Afib per Cards, she was initially treated with  cardizem gtt and then transitioned to PO Cardizem CD -  2-D echo from 07/30/15 showed left atrial dilatation, LVEF is 65-70. - TSH is normal - Likely precipitated by cocaine use and/or pulmonary disease  Acute pulmonary edema, acute respiratory failure  -due to CHF and pneumonia -s/p BiPAP in ED  - 2-D echo showed LVEF of 65-70% with mild LVH,  -improved with diuresis, transitioned to PO lasix, given 39more days pf lasix at discharge doubt she will need this long term, will need quick FU with PCP and re-assessment  Hypertension with hypertensive urgency  - BP 180/100 range on admission, improved with diltiazem infusion and Lasix  - stable, improved, norvasc stopped, since started Cardizem  Cocaine abuse  - counseled  COPD  -stable, nebs PRN  Influenza A, strep pneumo bacteremia -last admission -PCR positive for influenza A on 07/24/2016; she had been started on empiric Tamiflu earlier that day - She completed 5 days treatment on night of admission, hence stopped this -also given recent strep pneumonia and bacteremia restarted Ceftriaxone and resume Vantin at discharge to complete therapy, stable afebrile, improved now  Hypokalemia, hypomagnesemia  -replaced  Breast cancer - Left breast cancer s/p resection and radiation  - Continue Arimidex  Anxiety  - Continue Buspar  Consultations:  cardiology  Discharge Exam: Vitals:   08/02/16 0526 08/02/16 0821  BP: (!) 155/84 (!) 163/92  Pulse: 67 74  Resp: (!) 29 (!) 23  Temp: 98 F (36.7 C) 98.9 F (37.2 C)    General: AAOx3 Cardiovascular: S!S2/RRR Respiratory: few basilar ronchi  Discharge Instructions   Discharge Instructions    Diet -  low sodium heart healthy    Complete by:  As directed    Increase activity slowly    Complete by:  As directed      Discharge Medication List as of 08/02/2016 11:53 AM    START taking these medications   Details  diltiazem (CARDIZEM CD) 180 MG 24 hr capsule Take 1  capsule (180 mg total) by mouth daily., Starting Fri 08/02/2016, Print    furosemide (LASIX) 20 MG tablet Take 1 tablet (20 mg total) by mouth daily. For 3days, Starting Fri 08/02/2016, Print      CONTINUE these medications which have NOT CHANGED   Details  albuterol (PROVENTIL HFA;VENTOLIN HFA) 108 (90 Base) MCG/ACT inhaler Inhale 1-2 puffs into the lungs every 6 (six) hours as needed for wheezing or shortness of breath., Starting Fri 07/05/2016, Normal    anastrozole (ARIMIDEX) 1 MG tablet TAKE 1 TABLET BY MOUTH EVERY DAY, Normal    busPIRone (BUSPAR) 10 MG tablet Take 1 tablet (10 mg total) by mouth 2 (two) times daily., Starting Tue 06/25/2016, Normal    cefpodoxime (VANTIN) 100 MG tablet Take 1 tablet (100 mg total) by mouth 2 (two) times daily., Starting Sat 07/27/2016, No Print    ergocalciferol (DRISDOL) 50000 units capsule Take 1 capsule (50,000 Units total) by mouth once a week., Starting Thu 06/27/2016, Normal    gabapentin (NEURONTIN) 300 MG capsule Take 1 capsule (300 mg total) by mouth 3 (three) times daily., Starting Tue 06/25/2016, Normal    hydrALAZINE (APRESOLINE) 10 MG tablet Take 1 tablet (10 mg total) by mouth 3 (three) times daily., Starting Fri 07/12/2016, Print    nicotine (NICODERM CQ) 14 mg/24hr patch Place 1 patch (14 mg total) onto the skin daily., Starting Fri 07/05/2016, Normal    saccharomyces boulardii (FLORASTOR) 250 MG capsule Take 1 capsule (250 mg total) by mouth 2 (two) times daily., Starting Sat 07/27/2016, No Print      STOP taking these medications     amLODipine (NORVASC) 10 MG tablet      oseltamivir (TAMIFLU) 30 MG capsule        No Known Allergies Follow-up Information    Hollis,Lachina M, FNP. Schedule an appointment as soon as possible for a visit in 1 week(s).   Specialty:  Family Medicine Contact information: [address] [place] [address] [phone]            The results of significant diagnostics from this  hospitalization (including imaging, microbiology, ancillary and laboratory) are listed below for reference.    Significant Diagnostic Studies: Dg Chest 2 View  Result Date: 07/24/2016 CLINICAL DATA:  Shortness of breath. EXAM: CHEST  2 VIEW COMPARISON:  09/20/2015. FINDINGS: Cardiomegaly. Interstitial and alveolar prominence consistent with pulmonary edema. Small effusions. Dense aortic calcification. No pneumothorax. No osseous findings. Surgical clips LEFT axilla. IMPRESSION: Cardiomegaly.  Early CHF.Similar appearance to priors. Electronically Signed   By: Staci Righter M.D.   On: 07/24/2016 20:59   Dg Chest Port 1 View  Result Date: 07/28/2016 CLINICAL DATA:  Initial evaluation for acute cough, shortness of breath. EXAM: PORTABLE CHEST 1 VIEW COMPARISON:  Prior radiograph from 07/26/2016. FINDINGS: Cardiomegaly, stable from previous. Mediastinal silhouette within normal limits. Aortic atherosclerosis noted. Lungs normally inflated. Diffuse pulmonary vascular congestion with indistinctness of the interstitial markings suggesting pulmonary edema. Small bilateral pleural effusions present. More confluent bibasilar opacities favored to reflect edema and/ or atelectasis, although infiltrates not excluded. No pneumothorax. No acute osseous abnormality. Surgical clips overlie  the left axilla. IMPRESSION: 1. Cardiomegaly with moderate diffuse pulmonary edema and small bilateral pleural effusions. 2. More confluent patchy bibasilar opacities, favored to reflect atelectasis/edema, although superimposed infiltrates could be considered in the correct clinical setting. 3. Aortic atherosclerosis. Electronically Signed   By: Jeannine Boga M.D.   On: 07/28/2016 21:32   Dg Chest Port 1 View  Result Date: 07/26/2016 CLINICAL DATA:  Shortness of breath, cough EXAM: PORTABLE CHEST 1 VIEW COMPARISON:  07/24/2016 FINDINGS: Bilateral diffuse mild interstitial thickening similar to the prior exam. Trace bilateral  pleural effusions. No focal consolidation. No pneumothorax. Stable cardiomegaly. The osseous structures are unremarkable. IMPRESSION: Mild CHF without significant interval change. Electronically Signed   By: Kathreen Devoid   On: 07/26/2016 19:08   Dg Chest Port 1v Same Day  Result Date: 07/30/2016 CLINICAL DATA:  Shortness of breath and weakness. Flu-like symptoms. EXAM: PORTABLE CHEST 1 VIEW COMPARISON:  07/28/2016 and 07/26/2016 FINDINGS: There are patchy parenchymal densities in the mid and lower lungs bilaterally. Cannot exclude small pleural effusions, particularly at the left lung base. Atherosclerotic calcifications at the aortic arch. Heart size is within normal limits. IMPRESSION: Bilateral patchy parenchymal lung densities. Findings are concerning for bilateral pneumonia. Recommend follow up to ensure resolution. Electronically Signed   By: Markus Daft M.D.   On: 07/30/2016 12:05    Microbiology: Recent Results (from the past 240 hour(s))  Urine culture     Status: Abnormal   Collection Time: 07/24/16  9:00 PM  Result Value Ref Range Status   Specimen Description URINE, CLEAN CATCH  Final   Special Requests NONE  Final   Culture (A)  Final    <10,000 COLONIES/mL INSIGNIFICANT GROWTH Performed at [facility], [address], [place], [place] [address]    Report Status 07/26/2016 FINAL  Final  Blood Culture (routine x 2)     Status: None   Collection Time: 07/24/16  9:20 PM  Result Value Ref Range Status   Specimen Description RIGHT ANTECUBITAL  Final   Special Requests BOTTLES DRAWN AEROBIC AND ANAEROBIC 5CC  Final   Culture   Final    NO GROWTH 5 DAYS Performed at [facility], [place] [address] [place], [place] [address]    Report Status 07/30/2016 FINAL  Final  Blood Culture (routine x 2)     Status: Abnormal   Collection Time: 07/24/16  9:22 PM  Result Value Ref Range Status   Specimen Description PORTA CATH  Final   Special Requests BOTTLES DRAWN AEROBIC AND  ANAEROBIC 5CC  Final   Culture  Setup Time   Final    GRAM POSITIVE COCCI IN CHAINS IN PAIRS ANAEROBIC BOTTLE ONLY CRITICAL RESULT CALLED TO, READ BACK BY AND VERIFIED WITHSeleta Rhymes [facility] 2150 07/25/16 A BROWNING Performed at [facility], [place] [address] [place], [place] [address]    Culture STREPTOCOCCUS PNEUMONIAE (A)  Final   Report Status 07/27/2016 FINAL  Final   Organism ID, Bacteria STREPTOCOCCUS PNEUMONIAE  Final      Susceptibility   Streptococcus pneumoniae - MIC*    ERYTHROMYCIN <=0.12 SENSITIVE Sensitive     LEVOFLOXACIN 1 SENSITIVE Sensitive     PENICILLIN <=0.06 SENSITIVE Sensitive     CEFTRIAXONE <=0.12 SENSITIVE Sensitive     * STREPTOCOCCUS PNEUMONIAE  Blood Culture ID Panel (Reflexed)     Status: Abnormal   Collection Time: 07/24/16  9:22 PM  Result Value Ref Range Status   Enterococcus species NOT  DETECTED NOT DETECTED Final   Listeria monocytogenes NOT DETECTED NOT DETECTED Final   Staphylococcus species NOT DETECTED NOT DETECTED Final   Staphylococcus aureus NOT DETECTED NOT DETECTED Final   Streptococcus species DETECTED (A) NOT DETECTED Final    Comment: CRITICAL RESULT CALLED TO, READ BACK BY AND VERIFIED WITH: E JACKSON PHARMD 2150 07/25/16 A BROWNING    Streptococcus agalactiae NOT DETECTED NOT DETECTED Final   Streptococcus pneumoniae DETECTED (A) NOT DETECTED Final    Comment: CRITICAL RESULT CALLED TO, READ BACK BY AND VERIFIED WITH: E JACKSON PHARMD 2150 07/25/16 A BROWNING    Streptococcus pyogenes NOT DETECTED NOT DETECTED Final   Acinetobacter baumannii NOT DETECTED NOT DETECTED Final   Enterobacteriaceae species NOT DETECTED NOT DETECTED Final   Enterobacter cloacae complex NOT DETECTED NOT DETECTED Final   Escherichia coli NOT DETECTED NOT DETECTED Final   Klebsiella oxytoca NOT DETECTED NOT DETECTED Final   Klebsiella pneumoniae NOT DETECTED NOT DETECTED Final   Proteus species NOT DETECTED NOT DETECTED Final   Serratia marcescens  NOT DETECTED NOT DETECTED Final   Haemophilus influenzae NOT DETECTED NOT DETECTED Final   Neisseria meningitidis NOT DETECTED NOT DETECTED Final   Pseudomonas aeruginosa NOT DETECTED NOT DETECTED Final   Candida albicans NOT DETECTED NOT DETECTED Final   Candida glabrata NOT DETECTED NOT DETECTED Final   Candida krusei NOT DETECTED NOT DETECTED Final   Candida parapsilosis NOT DETECTED NOT DETECTED Final   Candida tropicalis NOT DETECTED NOT DETECTED Final    Comment: Performed at [facility], [place] [address], [place], [place] [address]  Blood culture (routine x 2)     Status: None   Collection Time: 07/28/16  8:53 PM  Result Value Ref Range Status   Specimen Description BLOOD RIGHT ARM  Final   Special Requests BOTTLES DRAWN AEROBIC AND ANAEROBIC 5CC EA  Final   Culture NO GROWTH 5 DAYS  Final   Report Status 08/02/2016 FINAL  Final  Blood culture (routine x 2)     Status: None   Collection Time: 07/28/16  8:59 PM  Result Value Ref Range Status   Specimen Description BLOOD RIGHT WRIST  Final   Special Requests BOTTLES DRAWN AEROBIC AND ANAEROBIC 5CC EA  Final   Culture NO GROWTH 5 DAYS  Final   Report Status 08/02/2016 FINAL  Final  MRSA PCR Screening     Status: None   Collection Time: 07/29/16  7:01 AM  Result Value Ref Range Status   MRSA by PCR NEGATIVE NEGATIVE Final    Comment:        The GeneXpert MRSA Assay (FDA approved for NASAL specimens only), is one component of a comprehensive MRSA colonization surveillance program. It is not intended to diagnose MRSA infection nor to guide or monitor treatment for MRSA infections.      Labs: Basic Metabolic Panel:  Recent Labs Lab 07/28/16 2038 07/29/16 0543 07/30/16 0428 07/31/16 0455 08/01/16 0508 08/02/16 0437  NA 134* 136 135 135 137 137  K 3.2* 3.6 3.5 3.9 5.0 4.8  CL 103 106 103 105 106 104  CO2 21* 22 25 24 26 24   GLUCOSE 119* 109* 99 124* 132* 86  BUN 19 20 21* 23* 18 18  CREATININE 0.98 0.91  1.09* 1.03* 1.03* 1.03*  CALCIUM 9.1 8.3* 8.2* 8.3* 9.1 9.0  MG 1.5* 2.3  --   --   --   --    Liver Function Tests:  Recent Labs Lab 07/28/16  2038  AST 30  ALT 22  ALKPHOS 77  BILITOT 0.4  PROT 7.5  ALBUMIN 2.3*   No results for input(s): LIPASE, AMYLASE in the last 168 hours. No results for input(s): AMMONIA in the last 168 hours. CBC:  Recent Labs Lab 07/27/16 0538 07/28/16 2038 07/30/16 0428 08/01/16 0508  WBC 13.3* 9.8 5.6 6.2  NEUTROABS  --  8.5*  --   --   HGB 12.2 13.4 11.9* 12.8  HCT 37.8 40.7 36.8 39.7  MCV 90.0 88.9 88.5 90.8  PLT 361 363 299 353   Cardiac Enzymes:  Recent Labs Lab 07/29/16 0001 07/29/16 0543 07/29/16 1240  TROPONINI 0.11* 0.09* 0.06*   BNP: BNP (last 3 results)  Recent Labs  06/25/16 1457 07/24/16 2008 07/28/16 2038  BNP 713.5* 523.5* 1,460.6*    ProBNP (last 3 results) No results for input(s): PROBNP in the last 8760 hours.  CBG:  Recent Labs Lab 07/27/16 0718 07/31/16 1616 08/02/16 0817  GLUCAP 130* 119* 117*       SignedDomenic Polite MD.  [facility] 08/02/2016, 2:47 PM

## 2016-08-06 ENCOUNTER — Ambulatory Visit: Admitting: Family Medicine

## 2016-08-23 ENCOUNTER — Ambulatory Visit: Admitting: Family Medicine

## 2016-08-26 ENCOUNTER — Encounter (HOSPITAL_COMMUNITY): Payer: Self-pay

## 2016-08-26 ENCOUNTER — Inpatient Hospital Stay (HOSPITAL_COMMUNITY)
Admission: EM | Admit: 2016-08-26 | Discharge: 2016-08-31 | DRG: 291 | Disposition: A | Discharge status: Home / Self Care | Payer: Medicare HMO | Attending: Internal Medicine | Admitting: Internal Medicine

## 2016-08-26 ENCOUNTER — Emergency Department (HOSPITAL_COMMUNITY): Payer: Medicare HMO

## 2016-08-26 DIAGNOSIS — R079 Chest pain, unspecified: Secondary | ICD-10-CM

## 2016-08-26 DIAGNOSIS — I161 Hypertensive emergency: Secondary | ICD-10-CM

## 2016-08-26 DIAGNOSIS — N179 Acute kidney failure, unspecified: Secondary | ICD-10-CM | POA: Diagnosis present

## 2016-08-26 DIAGNOSIS — F32A Depression, unspecified: Secondary | ICD-10-CM | POA: Diagnosis present

## 2016-08-26 DIAGNOSIS — J439 Emphysema, unspecified: Secondary | ICD-10-CM | POA: Diagnosis present

## 2016-08-26 DIAGNOSIS — Z79899 Other long term (current) drug therapy: Secondary | ICD-10-CM

## 2016-08-26 DIAGNOSIS — R000 Tachycardia, unspecified: Secondary | ICD-10-CM | POA: Diagnosis not present

## 2016-08-26 DIAGNOSIS — I5043 Acute on chronic combined systolic (congestive) and diastolic (congestive) heart failure: Secondary | ICD-10-CM | POA: Diagnosis present

## 2016-08-26 DIAGNOSIS — Z8550 Personal history of malignant neoplasm of unspecified urinary tract organ: Secondary | ICD-10-CM | POA: Diagnosis not present

## 2016-08-26 DIAGNOSIS — Z8551 Personal history of malignant neoplasm of bladder: Secondary | ICD-10-CM | POA: Diagnosis not present

## 2016-08-26 DIAGNOSIS — D638 Anemia in other chronic diseases classified elsewhere: Secondary | ICD-10-CM | POA: Diagnosis present

## 2016-08-26 DIAGNOSIS — E43 Unspecified severe protein-calorie malnutrition: Secondary | ICD-10-CM | POA: Diagnosis present

## 2016-08-26 DIAGNOSIS — J9621 Acute and chronic respiratory failure with hypoxia: Secondary | ICD-10-CM | POA: Diagnosis not present

## 2016-08-26 DIAGNOSIS — K219 Gastro-esophageal reflux disease without esophagitis: Secondary | ICD-10-CM | POA: Diagnosis present

## 2016-08-26 DIAGNOSIS — I480 Paroxysmal atrial fibrillation: Secondary | ICD-10-CM | POA: Diagnosis present

## 2016-08-26 DIAGNOSIS — C50412 Malignant neoplasm of upper-outer quadrant of left female breast: Secondary | ICD-10-CM | POA: Diagnosis not present

## 2016-08-26 DIAGNOSIS — Z923 Personal history of irradiation: Secondary | ICD-10-CM | POA: Diagnosis not present

## 2016-08-26 DIAGNOSIS — Z9012 Acquired absence of left breast and nipple: Secondary | ICD-10-CM

## 2016-08-26 DIAGNOSIS — F1010 Alcohol abuse, uncomplicated: Secondary | ICD-10-CM | POA: Diagnosis present

## 2016-08-26 DIAGNOSIS — E785 Hyperlipidemia, unspecified: Secondary | ICD-10-CM | POA: Diagnosis present

## 2016-08-26 DIAGNOSIS — F17210 Nicotine dependence, cigarettes, uncomplicated: Secondary | ICD-10-CM | POA: Diagnosis present

## 2016-08-26 DIAGNOSIS — I4891 Unspecified atrial fibrillation: Secondary | ICD-10-CM | POA: Diagnosis not present

## 2016-08-26 DIAGNOSIS — E876 Hypokalemia: Secondary | ICD-10-CM | POA: Diagnosis present

## 2016-08-26 DIAGNOSIS — Z720 Tobacco use: Secondary | ICD-10-CM | POA: Diagnosis present

## 2016-08-26 DIAGNOSIS — F1410 Cocaine abuse, uncomplicated: Secondary | ICD-10-CM | POA: Diagnosis present

## 2016-08-26 DIAGNOSIS — I5021 Acute systolic (congestive) heart failure: Secondary | ICD-10-CM | POA: Diagnosis not present

## 2016-08-26 DIAGNOSIS — F329 Major depressive disorder, single episode, unspecified: Secondary | ICD-10-CM | POA: Diagnosis present

## 2016-08-26 DIAGNOSIS — J9620 Acute and chronic respiratory failure, unspecified whether with hypoxia or hypercapnia: Secondary | ICD-10-CM

## 2016-08-26 DIAGNOSIS — F411 Generalized anxiety disorder: Secondary | ICD-10-CM | POA: Diagnosis present

## 2016-08-26 DIAGNOSIS — I110 Hypertensive heart disease with heart failure: Secondary | ICD-10-CM | POA: Diagnosis not present

## 2016-08-26 DIAGNOSIS — R0602 Shortness of breath: Secondary | ICD-10-CM | POA: Diagnosis not present

## 2016-08-26 DIAGNOSIS — I5033 Acute on chronic diastolic (congestive) heart failure: Secondary | ICD-10-CM | POA: Diagnosis not present

## 2016-08-26 DIAGNOSIS — R0902 Hypoxemia: Secondary | ICD-10-CM

## 2016-08-26 DIAGNOSIS — Z6821 Body mass index (BMI) 21.0-21.9, adult: Secondary | ICD-10-CM

## 2016-08-26 DIAGNOSIS — J449 Chronic obstructive pulmonary disease, unspecified: Secondary | ICD-10-CM | POA: Diagnosis not present

## 2016-08-26 DIAGNOSIS — I10 Essential (primary) hypertension: Secondary | ICD-10-CM | POA: Diagnosis not present

## 2016-08-26 DIAGNOSIS — Z90710 Acquired absence of both cervix and uterus: Secondary | ICD-10-CM | POA: Diagnosis not present

## 2016-08-26 DIAGNOSIS — I509 Heart failure, unspecified: Secondary | ICD-10-CM | POA: Diagnosis present

## 2016-08-26 DIAGNOSIS — Z170 Estrogen receptor positive status [ER+]: Secondary | ICD-10-CM

## 2016-08-26 DIAGNOSIS — Z853 Personal history of malignant neoplasm of breast: Secondary | ICD-10-CM | POA: Diagnosis present

## 2016-08-26 DIAGNOSIS — M109 Gout, unspecified: Secondary | ICD-10-CM | POA: Diagnosis present

## 2016-08-26 HISTORY — DX: Unspecified atrial fibrillation: I48.91

## 2016-08-26 LAB — TROPONIN I

## 2016-08-26 LAB — I-STAT VENOUS BLOOD GAS, ED
ACID-BASE EXCESS: 1 mmol/L (ref 0.0–2.0)
BICARBONATE: 25.1 mmol/L (ref 20.0–28.0)
O2 Saturation: 92 %
PCO2 VEN: 35.3 mmHg — AB (ref 44.0–60.0)
PO2 VEN: 60 mmHg — AB (ref 32.0–45.0)
TCO2: 26 mmol/L (ref 0–100)
pH, Ven: 7.46 — ABNORMAL HIGH (ref 7.250–7.430)

## 2016-08-26 LAB — COMPREHENSIVE METABOLIC PANEL
ALK PHOS: 104 U/L (ref 38–126)
ALT: 20 U/L (ref 14–54)
ANION GAP: 10 (ref 5–15)
AST: 29 U/L (ref 15–41)
Albumin: 2.3 g/dL — ABNORMAL LOW (ref 3.5–5.0)
BUN: 16 mg/dL (ref 6–20)
CALCIUM: 9 mg/dL (ref 8.9–10.3)
CHLORIDE: 106 mmol/L (ref 101–111)
CO2: 24 mmol/L (ref 22–32)
CREATININE: 1.07 mg/dL — AB (ref 0.44–1.00)
GFR, EST NON AFRICAN AMERICAN: 53 mL/min — AB (ref >60)
Glucose, Bld: 93 mg/dL (ref 65–99)
Potassium: 4.2 mmol/L (ref 3.5–5.1)
SODIUM: 140 mmol/L (ref 135–145)
Total Bilirubin: 0.4 mg/dL (ref 0.3–1.2)
Total Protein: 7.8 g/dL (ref 6.5–8.1)

## 2016-08-26 LAB — CBC WITH DIFFERENTIAL/PLATELET
Basophils Absolute: 0 10*3/uL (ref 0.0–0.1)
Basophils Relative: 0 %
EOS ABS: 0.1 10*3/uL (ref 0.0–0.7)
EOS PCT: 1 %
HCT: 39.6 % (ref 36.0–46.0)
Hemoglobin: 12.7 g/dL (ref 12.0–15.0)
LYMPHS ABS: 1.3 10*3/uL (ref 0.7–4.0)
LYMPHS PCT: 22 %
MCH: 29.1 pg (ref 26.0–34.0)
MCHC: 32.1 g/dL (ref 30.0–36.0)
MCV: 90.6 fL (ref 78.0–100.0)
MONOS PCT: 11 %
Monocytes Absolute: 0.6 10*3/uL (ref 0.1–1.0)
Neutro Abs: 3.7 10*3/uL (ref 1.7–7.7)
Neutrophils Relative %: 66 %
PLATELETS: 355 10*3/uL (ref 150–400)
RBC: 4.37 MIL/uL (ref 3.87–5.11)
RDW: 15.5 % (ref 11.5–15.5)
WBC: 5.7 10*3/uL (ref 4.0–10.5)

## 2016-08-26 LAB — PROCALCITONIN: Procalcitonin: <0.1 ng/mL

## 2016-08-26 LAB — BRAIN NATRIURETIC PEPTIDE: B NATRIURETIC PEPTIDE 5: 843.9 pg/mL — AB (ref 0.0–100.0)

## 2016-08-26 LAB — I-STAT TROPONIN, ED: TROPONIN I, POC: 0.02 ng/mL (ref 0.00–0.08)

## 2016-08-26 MED ORDER — HYDROCODONE-ACETAMINOPHEN 5-325 MG PO TABS: 1.0000 | ORAL_TABLET | ORAL | Status: DC | PRN

## 2016-08-26 MED ORDER — ACETAMINOPHEN 650 MG RE SUPP: 650.0000 mg | Freq: Four times a day (QID) | RECTAL | Status: DC | PRN

## 2016-08-26 MED ORDER — BUSPIRONE HCL 10 MG PO TABS
10.0000 mg | ORAL_TABLET | Freq: Two times a day (BID) | ORAL | Status: DC
  Administered 2016-08-26 – 2016-08-31 (×10): 10 mg via ORAL
  Filled 2016-08-26 (×10): qty 1

## 2016-08-26 MED ORDER — GABAPENTIN 300 MG PO CAPS
300.0000 mg | ORAL_CAPSULE | Freq: Three times a day (TID) | ORAL | Status: DC
  Administered 2016-08-26 – 2016-08-31 (×15): 300 mg via ORAL
  Filled 2016-08-26 (×15): qty 1

## 2016-08-26 MED ORDER — ACETAMINOPHEN 325 MG PO TABS: 650.0000 mg | ORAL_TABLET | Freq: Four times a day (QID) | ORAL | Status: DC | PRN

## 2016-08-26 MED ORDER — ALBUTEROL SULFATE (2.5 MG/3ML) 0.083% IN NEBU: 3.0000 mL | INHALATION_SOLUTION | Freq: Four times a day (QID) | RESPIRATORY_TRACT | Status: DC | PRN

## 2016-08-26 MED ORDER — DILTIAZEM LOAD VIA INFUSION
10.0000 mg | Freq: Once | INTRAVENOUS | Status: AC
  Administered 2016-08-26: 10 mg via INTRAVENOUS
  Filled 2016-08-26: qty 10

## 2016-08-26 MED ORDER — ADULT MULTIVITAMIN W/MINERALS CH
1.0000 | ORAL_TABLET | Freq: Every day | ORAL | Status: DC
  Administered 2016-08-27 – 2016-08-31 (×5): 1 via ORAL
  Filled 2016-08-26 (×5): qty 1

## 2016-08-26 MED ORDER — BISACODYL 10 MG RE SUPP: 10.0000 mg | Freq: Every day | RECTAL | Status: DC | PRN

## 2016-08-26 MED ORDER — ONDANSETRON HCL 4 MG PO TABS: 4.0000 mg | ORAL_TABLET | Freq: Four times a day (QID) | ORAL | Status: DC | PRN

## 2016-08-26 MED ORDER — SENNOSIDES-DOCUSATE SODIUM 8.6-50 MG PO TABS: 1.0000 | ORAL_TABLET | Freq: Every evening | ORAL | Status: DC | PRN

## 2016-08-26 MED ORDER — FUROSEMIDE 10 MG/ML IJ SOLN
40.0000 mg | Freq: Two times a day (BID) | INTRAMUSCULAR | Status: DC
  Administered 2016-08-26 – 2016-08-28 (×4): 40 mg via INTRAVENOUS
  Filled 2016-08-26 (×4): qty 4

## 2016-08-26 MED ORDER — THIAMINE HCL 100 MG/ML IJ SOLN: 100.0000 mg | Freq: Every day | INTRAMUSCULAR | Status: DC

## 2016-08-26 MED ORDER — FUROSEMIDE 10 MG/ML IJ SOLN
40.0000 mg | Freq: Once | INTRAMUSCULAR | Status: AC
  Administered 2016-08-26: 40 mg via INTRAVENOUS
  Filled 2016-08-26: qty 4

## 2016-08-26 MED ORDER — DILTIAZEM HCL 100 MG IV SOLR
5.0000 mg/h | INTRAVENOUS | Status: DC
  Administered 2016-08-26: 15 mg/h via INTRAVENOUS
  Administered 2016-08-26: 20 mg/h via INTRAVENOUS
  Administered 2016-08-26: 5 mg/h via INTRAVENOUS
  Administered 2016-08-27: 15 mg/h via INTRAVENOUS
  Filled 2016-08-26 (×4): qty 100

## 2016-08-26 MED ORDER — GUAIFENESIN ER 600 MG PO TB12: 600.0000 mg | ORAL_TABLET | Freq: Two times a day (BID) | ORAL | Status: DC | PRN

## 2016-08-26 MED ORDER — MAGNESIUM CITRATE PO SOLN: 1.0000 | Freq: Once | ORAL | Status: DC | PRN

## 2016-08-26 MED ORDER — ONDANSETRON HCL 4 MG/2ML IJ SOLN: 4.0000 mg | Freq: Four times a day (QID) | INTRAMUSCULAR | Status: DC | PRN

## 2016-08-26 MED ORDER — LORAZEPAM 2 MG/ML IJ SOLN: 1.0000 mg | Freq: Four times a day (QID) | INTRAMUSCULAR | Status: AC | PRN

## 2016-08-26 MED ORDER — VITAMIN B-1 100 MG PO TABS
100.0000 mg | ORAL_TABLET | Freq: Every day | ORAL | Status: DC
  Administered 2016-08-27 – 2016-08-31 (×5): 100 mg via ORAL
  Filled 2016-08-26 (×5): qty 1

## 2016-08-26 MED ORDER — LORAZEPAM 1 MG PO TABS: 1.0000 mg | ORAL_TABLET | Freq: Four times a day (QID) | ORAL | Status: AC | PRN

## 2016-08-26 MED ORDER — FUROSEMIDE 10 MG/ML IJ SOLN: 20.0000 mg | Freq: Two times a day (BID) | INTRAMUSCULAR | Status: DC

## 2016-08-26 MED ORDER — FOLIC ACID 1 MG PO TABS
1.0000 mg | ORAL_TABLET | Freq: Every day | ORAL | Status: DC
Start: 2016-08-26 — End: 2016-08-31
  Administered 2016-08-27 – 2016-08-31 (×5): 1 mg via ORAL
  Filled 2016-08-26 (×5): qty 1
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
## 2016-08-26 NOTE — ED Notes (Signed)
Shawn Route, PA at bedside.  Pt was removed from bipap to eat and drink, ok per Dr Maryan Rued.

## 2016-08-26 NOTE — ED Notes (Signed)
Admitting at bedside 

## 2016-08-26 NOTE — ED Triage Notes (Signed)
Pt presents for evaluation of SOB starting last night. Pt denies CP, reports EMS called to home last night and decided not to come to hospital. Pt unable to verbalize medical hx.

## 2016-08-26 NOTE — ED Notes (Signed)
Pt started on bipap by Caryl Pina, RT.

## 2016-08-26 NOTE — Progress Notes (Signed)
Patient refusing Bipap at this time.  Found mildly tachypneic with complaint of mild SOB at rest.  Agreed to wear if dyspnea worsens or SpO2 begins to drop.

## 2016-08-26 NOTE — Progress Notes (Signed)
Patient transferred to room 3W23 from ED.
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
## 2016-08-26 NOTE — ED Notes (Signed)
Patient transported to X-ray 

## 2016-08-27 ENCOUNTER — Observation Stay (HOSPITAL_COMMUNITY): Payer: Medicare HMO

## 2016-08-27 ENCOUNTER — Encounter (HOSPITAL_COMMUNITY): Payer: Self-pay | Admitting: *Deleted

## 2016-08-27 DIAGNOSIS — I161 Hypertensive emergency: Secondary | ICD-10-CM | POA: Diagnosis present

## 2016-08-27 DIAGNOSIS — J449 Chronic obstructive pulmonary disease, unspecified: Secondary | ICD-10-CM | POA: Diagnosis not present

## 2016-08-27 DIAGNOSIS — D638 Anemia in other chronic diseases classified elsewhere: Secondary | ICD-10-CM | POA: Diagnosis present

## 2016-08-27 DIAGNOSIS — N179 Acute kidney failure, unspecified: Secondary | ICD-10-CM | POA: Diagnosis present

## 2016-08-27 DIAGNOSIS — Z8550 Personal history of malignant neoplasm of unspecified urinary tract organ: Secondary | ICD-10-CM | POA: Diagnosis not present

## 2016-08-27 DIAGNOSIS — I480 Paroxysmal atrial fibrillation: Secondary | ICD-10-CM | POA: Diagnosis present

## 2016-08-27 DIAGNOSIS — R0602 Shortness of breath: Secondary | ICD-10-CM | POA: Diagnosis not present

## 2016-08-27 DIAGNOSIS — I4891 Unspecified atrial fibrillation: Secondary | ICD-10-CM | POA: Diagnosis not present

## 2016-08-27 DIAGNOSIS — Z9012 Acquired absence of left breast and nipple: Secondary | ICD-10-CM | POA: Diagnosis not present

## 2016-08-27 DIAGNOSIS — F1410 Cocaine abuse, uncomplicated: Secondary | ICD-10-CM | POA: Diagnosis present

## 2016-08-27 DIAGNOSIS — I5033 Acute on chronic diastolic (congestive) heart failure: Secondary | ICD-10-CM | POA: Diagnosis not present

## 2016-08-27 DIAGNOSIS — E876 Hypokalemia: Secondary | ICD-10-CM | POA: Diagnosis present

## 2016-08-27 DIAGNOSIS — Z8551 Personal history of malignant neoplasm of bladder: Secondary | ICD-10-CM | POA: Diagnosis not present

## 2016-08-27 DIAGNOSIS — F17210 Nicotine dependence, cigarettes, uncomplicated: Secondary | ICD-10-CM | POA: Diagnosis present

## 2016-08-27 DIAGNOSIS — J9620 Acute and chronic respiratory failure, unspecified whether with hypoxia or hypercapnia: Secondary | ICD-10-CM | POA: Diagnosis not present

## 2016-08-27 DIAGNOSIS — F329 Major depressive disorder, single episode, unspecified: Secondary | ICD-10-CM | POA: Diagnosis present

## 2016-08-27 DIAGNOSIS — E43 Unspecified severe protein-calorie malnutrition: Secondary | ICD-10-CM | POA: Diagnosis present

## 2016-08-27 DIAGNOSIS — J9621 Acute and chronic respiratory failure with hypoxia: Secondary | ICD-10-CM | POA: Diagnosis present

## 2016-08-27 DIAGNOSIS — Z90710 Acquired absence of both cervix and uterus: Secondary | ICD-10-CM | POA: Diagnosis not present

## 2016-08-27 DIAGNOSIS — F411 Generalized anxiety disorder: Secondary | ICD-10-CM | POA: Diagnosis present

## 2016-08-27 DIAGNOSIS — I110 Hypertensive heart disease with heart failure: Secondary | ICD-10-CM | POA: Diagnosis present

## 2016-08-27 DIAGNOSIS — I509 Heart failure, unspecified: Secondary | ICD-10-CM | POA: Diagnosis present

## 2016-08-27 DIAGNOSIS — M109 Gout, unspecified: Secondary | ICD-10-CM | POA: Diagnosis present

## 2016-08-27 DIAGNOSIS — Z923 Personal history of irradiation: Secondary | ICD-10-CM | POA: Diagnosis not present

## 2016-08-27 DIAGNOSIS — I5043 Acute on chronic combined systolic (congestive) and diastolic (congestive) heart failure: Secondary | ICD-10-CM | POA: Diagnosis present

## 2016-08-27 DIAGNOSIS — Z79899 Other long term (current) drug therapy: Secondary | ICD-10-CM | POA: Diagnosis not present

## 2016-08-27 DIAGNOSIS — E785 Hyperlipidemia, unspecified: Secondary | ICD-10-CM | POA: Diagnosis present

## 2016-08-27 DIAGNOSIS — I10 Essential (primary) hypertension: Secondary | ICD-10-CM | POA: Diagnosis not present

## 2016-08-27 DIAGNOSIS — F1010 Alcohol abuse, uncomplicated: Secondary | ICD-10-CM | POA: Diagnosis present

## 2016-08-27 DIAGNOSIS — C50412 Malignant neoplasm of upper-outer quadrant of left female breast: Secondary | ICD-10-CM | POA: Diagnosis present

## 2016-08-27 DIAGNOSIS — K219 Gastro-esophageal reflux disease without esophagitis: Secondary | ICD-10-CM | POA: Diagnosis present

## 2016-08-27 LAB — CBC
HCT: 34.5 % — ABNORMAL LOW (ref 36.0–46.0)
Hemoglobin: 10.9 g/dL — ABNORMAL LOW (ref 12.0–15.0)
MCH: 28.5 pg (ref 26.0–34.0)
MCHC: 31.6 g/dL (ref 30.0–36.0)
MCV: 90.3 fL (ref 78.0–100.0)
PLATELETS: 362 10*3/uL (ref 150–400)
RBC: 3.82 MIL/uL — AB (ref 3.87–5.11)
RDW: 15.2 % (ref 11.5–15.5)
WBC: 7 10*3/uL (ref 4.0–10.5)

## 2016-08-27 LAB — COMPREHENSIVE METABOLIC PANEL
ALBUMIN: 2 g/dL — AB (ref 3.5–5.0)
ALT: 17 U/L (ref 14–54)
AST: 25 U/L (ref 15–41)
Alkaline Phosphatase: 88 U/L (ref 38–126)
Anion gap: 11 (ref 5–15)
BUN: 26 mg/dL — ABNORMAL HIGH (ref 6–20)
CHLORIDE: 102 mmol/L (ref 101–111)
CO2: 23 mmol/L (ref 22–32)
CREATININE: 1.45 mg/dL — AB (ref 0.44–1.00)
Calcium: 8.3 mg/dL — ABNORMAL LOW (ref 8.9–10.3)
GFR calc non Af Amer: 37 mL/min — ABNORMAL LOW (ref >60)
GFR, EST AFRICAN AMERICAN: 43 mL/min — AB (ref >60)
GLUCOSE: 127 mg/dL — AB (ref 65–99)
Potassium: 3.4 mmol/L — ABNORMAL LOW (ref 3.5–5.1)
SODIUM: 136 mmol/L (ref 135–145)
Total Bilirubin: 0.5 mg/dL (ref 0.3–1.2)
Total Protein: 6.8 g/dL (ref 6.5–8.1)

## 2016-08-27 LAB — PROTIME-INR
INR: 1.14
Prothrombin Time: 14.7 seconds (ref 11.4–15.2)

## 2016-08-27 LAB — TROPONIN I: Troponin I: <0.03 ng/mL (ref <0.03)

## 2016-08-27 MED ORDER — ENOXAPARIN SODIUM 60 MG/0.6ML ~~LOC~~ SOLN
50.0000 mg | SUBCUTANEOUS | Status: DC
  Administered 2016-08-27: 13:00:00 50 mg via SUBCUTANEOUS
  Filled 2016-08-27: qty 0.6

## 2016-08-27 MED ORDER — POTASSIUM CHLORIDE CRYS ER 20 MEQ PO TBCR
40.0000 meq | EXTENDED_RELEASE_TABLET | Freq: Once | ORAL | Status: AC
  Administered 2016-08-27: 40 meq via ORAL
  Filled 2016-08-27: qty 2

## 2016-08-27 MED ORDER — DILTIAZEM HCL 60 MG PO TABS
60.0000 mg | ORAL_TABLET | Freq: Three times a day (TID) | ORAL | Status: DC
  Administered 2016-08-27 – 2016-08-31 (×12): 60 mg via ORAL
  Filled 2016-08-27 (×12): qty 1

## 2016-08-27 MED ORDER — DILTIAZEM HCL 60 MG PO TABS: 60.0000 mg | ORAL_TABLET | Freq: Three times a day (TID) | ORAL | Status: DC
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
## 2016-08-27 NOTE — Progress Notes (Signed)
PROGRESS NOTE    [name]  [id number] DOB: [birth year] DOA: 08/26/2016 PCP: Dorena Dew, FNP   Outpatient Specialists:     Brief Narrative:  Victoria Burke is a 66 y.o. female who presents with acute respiratory failure likely secondary to CHF in the setting of chronic COPD though no home O2 requirement and recent pneumonia. We'll percuss tone in further evaluate possible infectious underlying etiology though doubt as patient does not endorse any symptoms concerning for pneumonia and is afebrile with no white count. Will increase patient's diuresis to 20 mg IV of Lasix twice daily. Patient's cardiac condition will be difficult to manage given her ongoing use of cocaine. Greatly appreciate cardiology evaluation and insight into management of her cardiac condition especially her A. fib with RVR. Patient will need to be titrated off IV diltiazem to oral. Will monitor closely from a cardiac standpoint for signs of ischemia.   Assessment & Plan:   Principal Problem:   Respiratory failure, acute-on-chronic (HCC) Active Problems:   Breast cancer of upper-outer quadrant of left female breast (HCC)   Gout   Depression   Tobacco abuse   Cocaine abuse   Chronic obstructive pulmonary disease (HCC)   Essential hypertension   Generalized anxiety disorder   Acute on chronic congestive heart failure (HCC)   Atrial fibrillation with rapid ventricular response (HCC)   Acute hypoxic respiratory failure likely secondary to acute on chronic diastolic CHF exacerbation and Afib with RVR  in th the setting of cocaine abuse, last consumed 2 days prior to admisision .  -course breath sounds L>R-- will get non-contrasted CT Scan of lungs-- xray not revealing of cause  -  Daily weights and strict I/O  - ACEI, Ca Ch Blocker and BB as per Cards  - Lasix  20 IV BId  -procalcitonin low  Atrial Fibrillation  -lovenox for blood thinner-- doubt patient would be reliable at home for NOAC -IV  cardizem being converted to PO cardizem   Hyperlipidemia Continue home statins  COPD with no signs of exacerbation    Continue home meds  -continues to smoke- cessation offered  Breast Cancer Not on Arimidex  Follow with Dr. Burr Medico as outpatient  GERD, no acute symptoms Continue PPI  Anemia of chronic disease, no acute issues noted   Tobacco and cocaine, moderate alcohol  abuse . -  Nicotine patch offered, patient declines  -  Counseled cessation for polysubstance CIWA   Anxiety and Depression Continue Xanax for anxiety and Buspar   for depression   Hypokalemia -replete   DVT prophylaxis:  Fully anticoagulated- lovenox   Code Status: Full Code   Family Communication:   Disposition Plan:     Consultants:   cards     Subjective: Some shortness of breath  Objective: Vitals:   08/26/16 2334 08/27/16 0010 08/27/16 0500 08/27/16 1100  BP:  110/72 122/86   Pulse:  75 86   Resp:  (!) 30 (!) 27   Temp:  98.1 F (36.7 C) 98.8 F (37.1 C)   TempSrc:  Oral Oral   SpO2: 95% 96% 95%   Weight:   52.1 kg (114 lb 14.4 oz)   Height:    5\' 1"  (1.549 m)    Intake/Output Summary (Last 24 hours) at 08/27/16 1122 Last data filed at 08/27/16 0900  Gross per 24 hour  Intake          1249.83 ml  Output  750 ml  Net           499.83 ml   Filed Weights   08/27/16 0500  Weight: 52.1 kg (114 lb 14.4 oz)    Examination:  General exam: Appears calm and comfortable  Respiratory system: diminished, no wheezing-- coarse breath sound in Left lung Cardiovascular system: irregular Gastrointestinal system: Abdomen is nondistended, soft and nontender. No organomegaly or masses felt. Normal bowel sounds heard. Central nervous system: Alert and oriented. No focal neurological deficits.     Data Reviewed: I have personally reviewed following labs and imaging studies  CBC:  Recent Labs Lab 08/26/16 1101 08/27/16 0210  WBC 5.7 7.0  NEUTROABS  3.7  --   HGB 12.7 10.9*  HCT 39.6 34.5*  MCV 90.6 90.3  PLT 355 [id number]   Basic Metabolic Panel:  Recent Labs Lab 08/26/16 1101 08/27/16 0210  NA 140 136  K 4.2 3.4*  CL 106 102  CO2 24 23  GLUCOSE 93 127*  BUN 16 26*  CREATININE 1.07* 1.45*  CALCIUM 9.0 8.3*   GFR: Estimated Creatinine Clearance: 29.2 mL/min (by C-G formula based on SCr of 1.45 mg/dL (H)). Liver Function Tests:  Recent Labs Lab 08/26/16 1101 08/27/16 0210  AST 29 25  ALT 20 17  ALKPHOS 104 88  BILITOT 0.4 0.5  PROT 7.8 6.8  ALBUMIN 2.3* 2.0*   No results for input(s): LIPASE, AMYLASE in the last 168 hours. No results for input(s): AMMONIA in the last 168 hours. Coagulation Profile:  Recent Labs Lab 08/27/16 0210  INR 1.14   Cardiac Enzymes:  Recent Labs Lab 08/26/16 1439 08/26/16 1853 08/27/16 0210  TROPONINI <0.03 <0.03 <0.03   BNP (last 3 results) No results for input(s): PROBNP in the last 8760 hours. HbA1C: No results for input(s): HGBA1C in the last 72 hours. CBG: No results for input(s): GLUCAP in the last 168 hours. Lipid Profile: No results for input(s): CHOL, HDL, LDLCALC, TRIG, CHOLHDL, LDLDIRECT in the last 72 hours. Thyroid Function Tests: No results for input(s): TSH, T4TOTAL, FREET4, T3FREE, THYROIDAB in the last 72 hours. Anemia Panel: No results for input(s): VITAMINB12, FOLATE, FERRITIN, TIBC, IRON, RETICCTPCT in the last 72 hours. Urine analysis:    Component Value Date/Time   COLORURINE YELLOW 07/24/2016 2100   APPEARANCEUR CLEAR 07/24/2016 2100   LABSPEC 1.013 07/24/2016 2100   PHURINE 5.0 07/24/2016 2100   GLUCOSEU NEGATIVE 07/24/2016 2100   HGBUR SMALL (A) 07/24/2016 2100   [place] NEGATIVE 07/24/2016 2100   KETONESUR NEGATIVE 07/24/2016 2100   PROTEINUR >=300 (A) 07/24/2016 2100   UROBILINOGEN 0.2 06/25/2016 1507   NITRITE NEGATIVE 07/24/2016 2100   LEUKOCYTESUR NEGATIVE 07/24/2016 2100     )No results found for this or any previous visit  (from the past 240 hour(s)).    Anti-infectives    None       Radiology Studies: Dg Chest 2 View  Result Date: 08/26/2016 CLINICAL DATA:  Short of breath. EXAM: CHEST  2 VIEW COMPARISON:  07/30/2016 FINDINGS: Cardiac enlargement.  Pulmonary vascular congestion. Bibasilar airspace disease with progression since the prior study. Small bilateral pleural effusions. Atherosclerotic aorta with calcification. IMPRESSION: Progression of bilateral airspace disease since the prior study. Progression of small bilateral effusions. This pattern could be seen with diffuse pneumonia versus congestive heart failure and edema. Electronically Signed   By: Franchot Gallo M.D.   On: 08/26/2016 10:44   Dg Chest Port 1 View  Result Date: 08/27/2016 CLINICAL DATA:  Exacerbation of CHF,  shortness of breath EXAM: PORTABLE CHEST 1 VIEW COMPARISON:  Chest x-ray of 08/26/2016 FINDINGS: The lungs are not well aerated. There are bibasilar opacities with probable small effusions and cardiomegaly. This pattern is most consistent with congestive heart failure although pneumonia involving the lung bases cannot be excluded and followup is recommended. Moderate thoracic aortic atherosclerosis is noted. No bony abnormality is seen. IMPRESSION: 1. Diminished aeration with probable increasing CHF and effusions. Cannot exclude basilar pneumonia. 2. Moderate thoracic aortic atherosclerosis. Electronically Signed   By: Ivar Drape M.D.   On: 08/27/2016 08:58        Scheduled Meds: . busPIRone  10 mg Oral BID  . diltiazem  60 mg Oral Q8H  . enoxaparin (LOVENOX) injection  50 mg Subcutaneous Q24H  . folic acid  1 mg Oral Daily  . furosemide  40 mg Intravenous BID  . gabapentin  300 mg Oral TID  . multivitamin with minerals  1 tablet Oral Daily  . thiamine  100 mg Oral Daily   Or  . thiamine  100 mg Intravenous Daily   Continuous Infusions:   LOS: 0 days    Time spent: 25 min    [place], DO [facility] Pager [phone]  If 7PM-7AM, please contact night-coverage [email or web address] Password [email or web address] 08/27/2016, 11:22 AM

## 2016-08-27 NOTE — Care Management Note (Signed)
Case Management Note  Patient Details  Name: Victoria Burke MRN: [id number] Date of Birth: 1951/05/15  Subjective/Objective:   resp failure, CHF, COPD                 Action/Plan: Discharge Planning: NCM lives with room-mate- Nadara Mode. Pt states she takes the bus to her MD appts. Pt may need home oxygen at dc. Will check walking oxygen sats prior to dc.   PCP Dorena Dew MD  Expected Discharge Date:                  Expected Discharge Plan:  Home/Self Care  In-House Referral:  NA  Discharge planning Services  CM Consult  Post Acute Care Choice:    Choice offered to:     DME Arranged:    DME Agency:     HH Arranged:    HH Agency:     Status of Service:  In process, will continue to follow  If discussed at Long Length of Stay Meetings, dates discussed:    Additional Comments:  Erenest Rasher, RN 08/27/2016, 2:23 PM

## 2016-08-27 NOTE — Progress Notes (Signed)
Progress Note  Patient Name: Victoria Burke Date of Encounter: 08/27/2016  Primary Cardiologist: Dr Marlou Porch  Patient Profile     66 y.o. female w/ hx  breast CA s/p radiation in 2013, COPD w/ recent admission for acute exacerbation in the setting of flu and bacterial PNA,GERD, h/o gastric ulcer, cocaine use,HTN and HLD. Admit after cocaine w/ tachycardia>>MAT, EF nl on echo. Admit 02/19 w/ SOB, CHF & COPD, now in atrial fib (recent cocaine use).   Subjective   Breathing better today, but still very short of breath.  Inpatient Medications    Scheduled Meds: . busPIRone  10 mg Oral BID  . folic acid  1 mg Oral Daily  . furosemide  40 mg Intravenous BID  . gabapentin  300 mg Oral TID  . multivitamin with minerals  1 tablet Oral Daily  . thiamine  100 mg Oral Daily   Or  . thiamine  100 mg Intravenous Daily   Continuous Infusions: . diltiazem (CARDIZEM) infusion 15 mg/hr (08/27/16 0620)   PRN Meds: acetaminophen **OR** acetaminophen, albuterol, bisacodyl, HYDROcodone-acetaminophen, LORazepam **OR** LORazepam, magnesium citrate, ondansetron **OR** ondansetron (ZOFRAN) IV, senna-docusate   Vital Signs    Vitals:   08/26/16 2117 08/26/16 2334 08/27/16 0010 08/27/16 0500  BP:   110/72 122/86  Pulse: 83  75 86  Resp: (!) 30  (!) 30 (!) 27  Temp:   98.1 F (36.7 C) 98.8 F (37.1 C)  TempSrc:   Oral Oral  SpO2: 97% 95% 96% 95%  Weight:    114 lb 14.4 oz (52.1 kg)    Intake/Output Summary (Last 24 hours) at 08/27/16 0957 Last data filed at 08/27/16 0900  Gross per 24 hour  Intake          1249.83 ml  Output              750 ml  Net           499.83 ml   Filed Weights   08/27/16 0500  Weight: 114 lb 14.4 oz (52.1 kg)    Telemetry    Atrial fib, RVR - Personally Reviewed  ECG    Atrial fib, RVR - Personally Reviewed  Physical Exam   General: Well developed, thin, ill-appearing, female appearing in no acute distress. Head: Normocephalic,  atraumatic.  Neck: Supple without bruits, JVD 10 cm. Lungs:  Resp regular and unlabored, rales bilaterally, L>R.  Decreased airflow   Heart: Irreg R&R, S1, S2, no S3, S4, soft murmur; no rub. Abdomen: Soft, non-tender, non-distended with normoactive bowel sounds. No hepatomegaly. No rebound/guarding. No obvious abdominal masses. Extremities: No clubbing, cyanosis, no edema. Distal pedal pulses are 2+ bilaterally. Neuro: Alert and oriented X 3. Moves all extremities spontaneously. Psych: Normal affect.  Labs    Hematology Recent Labs Lab 08/26/16 1101 08/27/16 0210  WBC 5.7 7.0  RBC 4.37 3.82*  HGB 12.7 10.9*  HCT 39.6 34.5*  MCV 90.6 90.3  MCH 29.1 28.5  MCHC 32.1 31.6  RDW 15.5 15.2  PLT 355 362    Chemistry Recent Labs Lab 08/26/16 1101 08/27/16 0210  NA 140 136  K 4.2 3.4*  CL 106 102  CO2 24 23  GLUCOSE 93 127*  BUN 16 26*  CREATININE 1.07* 1.45*  CALCIUM 9.0 8.3*  PROT 7.8 6.8  ALBUMIN 2.3* 2.0*  AST 29 25  ALT 20 17  ALKPHOS 104 88  BILITOT 0.4 0.5  GFRNONAA 53* 37*  GFRAA >60 43*  ANIONGAP 10  11     Cardiac Enzymes Recent Labs Lab 08/26/16 1439 08/26/16 1853 08/27/16 0210  TROPONINI <0.03 <0.03 <0.03    Recent Labs Lab 08/26/16 1112  TROPIPOC 0.02     BNP Recent Labs Lab 08/26/16 1101  BNP 843.9*     DDimer No results for input(s): DDIMER in the last 168 hours.   Radiology    Dg Chest 2 View  Result Date: 08/26/2016 CLINICAL DATA:  Short of breath. EXAM: CHEST  2 VIEW COMPARISON:  07/30/2016 FINDINGS: Cardiac enlargement.  Pulmonary vascular congestion. Bibasilar airspace disease with progression since the prior study. Small bilateral pleural effusions. Atherosclerotic aorta with calcification. IMPRESSION: Progression of bilateral airspace disease since the prior study. Progression of small bilateral effusions. This pattern could be seen with diffuse pneumonia versus congestive heart failure and edema. Electronically Signed   By:  Franchot Gallo M.D.   On: 08/26/2016 10:44   Dg Chest Port 1 View  Result Date: 08/27/2016 CLINICAL DATA:  Exacerbation of CHF, shortness of breath EXAM: PORTABLE CHEST 1 VIEW COMPARISON:  Chest x-ray of 08/26/2016 FINDINGS: The lungs are not well aerated. There are bibasilar opacities with probable small effusions and cardiomegaly. This pattern is most consistent with congestive heart failure although pneumonia involving the lung bases cannot be excluded and followup is recommended. Moderate thoracic aortic atherosclerosis is noted. No bony abnormality is seen. IMPRESSION: 1. Diminished aeration with probable increasing CHF and effusions. Cannot exclude basilar pneumonia. 2. Moderate thoracic aortic atherosclerosis. Electronically Signed   By: Ivar Drape M.D.   On: 08/27/2016 08:58     Cardiac Studies   Echo: 07/29/2016 - Left ventricle: The cavity size was normal. There was moderate concentric hypertrophy. Systolic function was vigorous. The estimated ejection fraction was in the range of 65% to 70%. Wall motion was normal; there were no regional wall motion abnormalities. The study was not technically sufficient to allow evaluation of LV diastolic dysfunction due to atrialfibrillation. - Aortic valve: Trileaflet; mildly thickened, mildly calcified leaflets. There was no regurgitation. - Mitral valve: Structurally normal valve. There was mildregurgitation. - Left atrium: The atrium was moderately dilated. - Right ventricle: The cavity size was normal. Wall thickness was normal. Systolic function was normal. - Right atrium: The atrium was mildly dilated. - Tricuspid valve: There was mild regurgitation. - Pulmonic valve: There was no regurgitation. - Pulmonary arteries: Systolic pressure was mildly increased. PA peak pressure: 39 mm Hg (S). - Inferior vena cava: The vessel was normal in size.  Patient Profile     66 y.o. female w/ hx breast CA s/p radiation in 2013,  COPD w/ recent admission for acute exacerbation in the setting of flu and bacterial PNA,GERD, h/o gastric ulcer, cocaine use,HTN and HLD. Admit after cocaine w/ tachycardia>>MAT, EF nl on echo. Admit 02/19 w/ SOB, CHF & COPD, now in atrial fib (recent cocaine use).   Assessment & Plan    Principal Problem:   Respiratory failure, acute-on-chronic ([place]) - Unclear contribution of COPD/CHF but feel both play a role.  - Dr Eliseo Squires will do CT to clarify  Active Problems:   Chronic obstructive pulmonary disease ([place]) - per IM    Acute on chronic congestive heart failure ([place]) - pt got Lasix 40 mg IV in the ER, on IV Lasix at 40 mg IV bid - follow I/O and weights on the Lasix  - daily BMET - may be able to change to PO Lasix in am.     Atrial fibrillation with  rapid ventricular response (HCC) - Cardizem now at 15 mg/hr, HR close to 100 at rest, will change to 60 mg q 8 hr and increase prn - Cocaine use undoubtedly contributing - RVR may be contributing to CHF - no BB w/ cocaine use.  - not long-term anticoag candidate due to compliance issues and drug use.  - Dr Eliseo Squires will add Lovenox or heparin while in-hospital - CHA2DS2VASc=4 (age x 1, female, CHF, HTN)  Otherwise, per IM.   Breast cancer of upper-outer quadrant of left female breast ([place])   Gout   Depression   Tobacco abuse   Cocaine abuse   Essential hypertension   Generalized anxiety disorder   Signed, Lenoard Aden 9:57 AM 08/27/2016 Pager: [phone]  Pt seen and examined  I agree with findings of R Barrett above   Pt 66 yo with CHF, Atrial fib and substance abuse  Presented yesterday with aifb with RVR and resp distress.   Pt is clinically improved today  Lungs still with rhonchi  Mild rales  Cardiiac exam  Irreg Irreg  No S3  Rates stll a little high   Transition to PO    Wll continue to follow     Dorris Carnes

## 2016-08-27 NOTE — Progress Notes (Signed)
Initial Nutrition Assessment  DOCUMENTATION CODES:   Severe malnutrition in context of chronic illness  INTERVENTION:    Snacks TID between meals to maximize oral intake.  NUTRITION DIAGNOSIS:   Malnutrition related to chronic illness as evidenced by severe depletion of muscle mass, severe depletion of body fat, percent weight loss (>10% weight loss in 6 months).  GOAL:   Patient will meet greater than or equal to 90% of their needs  MONITOR:   PO intake, I & O's  REASON FOR ASSESSMENT:   Malnutrition Screening Tool    ASSESSMENT:   66 y.o. female with hx of ongoing cocaine abuse who [facility] with acute respiratory failure likely secondary to CHF in the setting of chronic COPD though no home O2 requirement and recent pneumonia.  Patient reports poor appetite with poor intake and gradual weight loss over the past 6 months. She has lost 10.3% of her usual weight in the past 6 months. She has consumed 100% of meals today (breakfast & lunch). She requests snacks between meals, as she is now hungry.  Nutrition-Focused physical exam completed. Findings are severe fat depletion, severe muscle depletion, and no edema.  Patient with severe PCM related to chronic illness (COPD, CHF). Labs reviewed: potassium 3.4 Medications reviewed and include folic acid, lasix, MVI, thiamine.  Diet Order:  Diet Heart Room service appropriate? Yes; Fluid consistency: Thin  Skin:  Reviewed, no issues  Last BM:  2/18  Height:   Ht Readings from Last 1 Encounters:  08/27/16 5\' 1"  (1.549 m)    Weight:   Wt Readings from Last 1 Encounters:  08/27/16 114 lb 14.4 oz (52.1 kg)    Ideal Body Weight:  47.7 kg  BMI:  Body mass index is 21.71 kg/m.  Estimated Nutritional Needs:   Kcal:  1600-1800  Protein:  75-85 gm  Fluid:  1.6-1.8 L  EDUCATION NEEDS:   No education needs identified at this time  Molli Barrows, [place], [place], [place] Pager [phone] After Hours Pager  [phone]

## 2016-08-27 NOTE — Care Management Obs Status (Signed)
[place] NOTIFICATION   Patient Details  Name: Victoria Burke MRN: [id number] Date of Birth: [birth year]   Medicare Observation Status Notification Given:  Yes    Erenest Rasher, RN 08/27/2016, 2:26 PM

## 2016-08-28 ENCOUNTER — Inpatient Hospital Stay (HOSPITAL_COMMUNITY): Payer: Medicare HMO

## 2016-08-28 DIAGNOSIS — I10 Essential (primary) hypertension: Secondary | ICD-10-CM

## 2016-08-28 LAB — CBC
HCT: 35.7 % — ABNORMAL LOW (ref 36.0–46.0)
Hemoglobin: 11.3 g/dL — ABNORMAL LOW (ref 12.0–15.0)
MCH: 28.8 pg (ref 26.0–34.0)
MCHC: 31.7 g/dL (ref 30.0–36.0)
MCV: 91.1 fL (ref 78.0–100.0)
PLATELETS: 370 10*3/uL (ref 150–400)
RBC: 3.92 MIL/uL (ref 3.87–5.11)
RDW: 15.1 % (ref 11.5–15.5)
WBC: 6.1 10*3/uL (ref 4.0–10.5)

## 2016-08-28 LAB — BASIC METABOLIC PANEL
Anion gap: 10 (ref 5–15)
BUN: 20 mg/dL (ref 6–20)
CALCIUM: 8.5 mg/dL — AB (ref 8.9–10.3)
CHLORIDE: 99 mmol/L — AB (ref 101–111)
CO2: 25 mmol/L (ref 22–32)
CREATININE: 1.13 mg/dL — AB (ref 0.44–1.00)
GFR calc Af Amer: 58 mL/min — ABNORMAL LOW (ref >60)
GFR calc non Af Amer: 50 mL/min — ABNORMAL LOW (ref >60)
GLUCOSE: 101 mg/dL — AB (ref 65–99)
Potassium: 3.8 mmol/L (ref 3.5–5.1)
Sodium: 134 mmol/L — ABNORMAL LOW (ref 135–145)

## 2016-08-28 LAB — PROCALCITONIN: Procalcitonin: <0.1 ng/mL

## 2016-08-28 LAB — HIV ANTIBODY (ROUTINE TESTING W REFLEX): HIV SCREEN 4TH GENERATION: NONREACTIVE

## 2016-08-28 MED ORDER — FUROSEMIDE 10 MG/ML IJ SOLN
80.0000 mg | Freq: Once | INTRAMUSCULAR | Status: AC
  Administered 2016-08-28: 80 mg via INTRAVENOUS
  Filled 2016-08-28: qty 8

## 2016-08-28 MED ORDER — ENOXAPARIN SODIUM 60 MG/0.6ML ~~LOC~~ SOLN
50.0000 mg | Freq: Two times a day (BID) | SUBCUTANEOUS | Status: DC
  Administered 2016-08-28 – 2016-08-31 (×7): 50 mg via SUBCUTANEOUS
  Filled 2016-08-28 (×7): qty 0.6

## 2016-08-28 NOTE — Progress Notes (Signed)
PROGRESS NOTE    Victoria Burke  [id number] DOB: February 27, 1951 DOA: 08/26/2016 PCP: Dorena Dew, FNP   Outpatient Specialists:     Brief Narrative:  Victoria Burke is a 66 y.o. female who presents with acute respiratory failure likely secondary to CHF in the setting of chronic COPD though no home O2 requirement and recent pneumonia. We'll percuss tone in further evaluate possible infectious underlying etiology though doubt as patient does not endorse any symptoms concerning for pneumonia and is afebrile with no white count. Will increase patient's diuresis to 20 mg IV of Lasix twice daily. Patient's cardiac condition will be difficult to manage given her ongoing use of cocaine. Greatly appreciate cardiology evaluation and insight into management of her cardiac condition especially her A. fib with RVR. Patient will need to be titrated off IV diltiazem to oral. Will monitor closely from a cardiac standpoint for signs of ischemia.   Assessment & Plan:   Principal Problem:   Respiratory failure, acute-on-chronic (HCC) Active Problems:   Breast cancer of upper-outer quadrant of left female breast (HCC)   Gout   Depression   Tobacco abuse   Cocaine abuse   Chronic obstructive pulmonary disease (HCC)   Essential hypertension   Generalized anxiety disorder   Acute on chronic congestive heart failure (HCC)   Atrial fibrillation with rapid ventricular response (HCC)   CHF exacerbation (HCC)   Acute hypoxic respiratory failure likely secondary to acute on chronic diastolic CHF exacerbation and Afib with RVR  in th the setting of cocaine abuse, last consumed 2 days prior to admisision .  -course breath sounds L>R-- will get non-contrasted CT Scan of lungs-- xray not revealing of cause  -  Daily weights and strict I/O  - ACEI, Ca Ch Blocker and BB as per Cards  - Lasix  40 IV BID -procalcitonin low- do doubt infection  Atrial Fibrillation  -lovenox for blood thinner-- doubt patient  would be reliable at home for NOAC -IV cardizem converted to PO cardizem   Hyperlipidemia Continue home statins  COPD with no signs of exacerbation    Continue home meds  -continues to smoke- cessation offered  Breast Cancer Not on Arimidex  Follow with Dr. Burr Medico as outpatient  GERD, no acute symptoms Continue PPI  Anemia of chronic disease, no acute issues noted   Tobacco and cocaine, moderate alcohol  abuse . -  Nicotine patch offered, patient declines  -  Counseled cessation for polysubstance CIWA   Anxiety and Depression Continue Xanax for anxiety and Buspar   for depression   Hypokalemia -replete  AKI -Cr improved  DVT prophylaxis:  Fully anticoagulated- lovenox   Code Status: Full Code   Family Communication: Called son, no answer  Disposition Plan:     Consultants:   cards     Subjective: Thinks her breathing has improved  Objective: Vitals:   08/27/16 2300 08/28/16 0402 08/28/16 0405 08/28/16 0808  BP: 129/79  132/83 (!) 143/76  Pulse: 62  74 63  Resp: (!) 29  (!) 27 (!) 23  Temp: 98.4 F (36.9 C)  98.5 F (36.9 C) 98.2 F (36.8 C)  TempSrc: Oral  Oral Oral  SpO2: 96%  (!) 88% (!) 88%  Weight:  50.7 kg (111 lb 11.2 oz)    Height:        Intake/Output Summary (Last 24 hours) at 08/28/16 1041 Last data filed at 08/28/16 0809  Gross per 24 hour  Intake  632 ml  Output              600 ml  Net               32 ml   Filed Weights   08/27/16 0500 08/28/16 0402  Weight: 52.1 kg (114 lb 14.4 oz) 50.7 kg (111 lb 11.2 oz)    Examination:  General exam: Appears calm and comfortable  Respiratory system: diminished, no wheezing-- crackles in bases b/l Cardiovascular system: irregular Gastrointestinal system: Abdomen is nondistended, soft and nontender. No organomegaly or masses felt. Normal bowel sounds heard. Central nervous system: Alert and oriented. No focal neurological deficits.     Data Reviewed: I  have personally reviewed following labs and imaging studies  CBC:  Recent Labs Lab 08/26/16 1101 08/27/16 0210 08/28/16 0529  WBC 5.7 7.0 6.1  NEUTROABS 3.7  --   --   HGB 12.7 10.9* 11.3*  HCT 39.6 34.5* 35.7*  MCV 90.6 90.3 91.1  PLT 355 362 0000000   Basic Metabolic Panel:  Recent Labs Lab 08/26/16 1101 08/27/16 0210 08/28/16 0529  NA 140 136 134*  K 4.2 3.4* 3.8  CL 106 102 99*  CO2 24 23 25   GLUCOSE 93 127* 101*  BUN 16 26* 20  CREATININE 1.07* 1.45* 1.13*  CALCIUM 9.0 8.3* 8.5*   GFR: Estimated Creatinine Clearance: 37.5 mL/min (by C-G formula based on SCr of 1.13 mg/dL (H)). Liver Function Tests:  Recent Labs Lab 08/26/16 1101 08/27/16 0210  AST 29 25  ALT 20 17  ALKPHOS 104 88  BILITOT 0.4 0.5  PROT 7.8 6.8  ALBUMIN 2.3* 2.0*   No results for input(s): LIPASE, AMYLASE in the last 168 hours. No results for input(s): AMMONIA in the last 168 hours. Coagulation Profile:  Recent Labs Lab 08/27/16 0210  INR 1.14   Cardiac Enzymes:  Recent Labs Lab 08/26/16 1439 08/26/16 1853 08/27/16 0210  TROPONINI <0.03 <0.03 <0.03   BNP (last 3 results) No results for input(s): PROBNP in the last 8760 hours. HbA1C: No results for input(s): HGBA1C in the last 72 hours. CBG: No results for input(s): GLUCAP in the last 168 hours. Lipid Profile: No results for input(s): CHOL, HDL, LDLCALC, TRIG, CHOLHDL, LDLDIRECT in the last 72 hours. Thyroid Function Tests: No results for input(s): TSH, T4TOTAL, FREET4, T3FREE, THYROIDAB in the last 72 hours. Anemia Panel: No results for input(s): VITAMINB12, FOLATE, FERRITIN, TIBC, IRON, RETICCTPCT in the last 72 hours. Urine analysis:    Component Value Date/Time   COLORURINE YELLOW 07/24/2016 2100   APPEARANCEUR CLEAR 07/24/2016 2100   LABSPEC 1.013 07/24/2016 2100   PHURINE 5.0 07/24/2016 2100   GLUCOSEU NEGATIVE 07/24/2016 2100   HGBUR SMALL (A) 07/24/2016 2100   [place] NEGATIVE 07/24/2016 2100    KETONESUR NEGATIVE 07/24/2016 2100   PROTEINUR >=300 (A) 07/24/2016 2100   UROBILINOGEN 0.2 06/25/2016 1507   NITRITE NEGATIVE 07/24/2016 2100   LEUKOCYTESUR NEGATIVE 07/24/2016 2100     )No results found for this or any previous visit (from the past 240 hour(s)).    Anti-infectives    None       Radiology Studies: Ct Chest Wo Contrast  Result Date: 08/27/2016 CLINICAL DATA:  66 year old female with history of hypoxia. Shortness of breath. EXAM: CT CHEST WITHOUT CONTRAST TECHNIQUE: Multidetector CT imaging of the chest was performed following the standard protocol without IV contrast. COMPARISON:  Chest CT 09/20/2015. FINDINGS: Cardiovascular: Heart size is mildly enlarged. Small amount of pericardial fluid and/or  thickening. No pericardial calcification. There is aortic atherosclerosis, as well as atherosclerosis of the great vessels of the mediastinum and the coronary arteries, including calcified atherosclerotic plaque in the left anterior descending and right coronary arteries. Calcifications of the aortic valve (mild). Mediastinum/Nodes: Multiple enlarged mediastinal lymph nodes. The largest of these include a 1.9 cm short axis prevascular lymph node (image 65 of series 3) and a 1.6 cm short axis low right paratracheal lymph node (image 71 of series 3). Esophagus is unremarkable in appearance. Several enlarged right axillary lymph nodes are also noted measuring up to 11 mm in short axis. Surgical clips in the left axilla, presumably from prior lymph node dissection. Lungs/Pleura: Moderate to large right and small left pleural effusions lying dependently. There are extensive dependent opacities in the lower lobes of the lungs bilaterally which are predominantly related to passive subsegmental atelectasis, although underlying airspace consolidation is also suspected (poorly evaluated on today's noncontrast CT, particularly in the right lower lobe. Examination) Mild diffuse bronchial wall  thickening with mild centrilobular and paraseptal emphysema. Mild diffuse ground-glass attenuation with some interlobular septal thickening, suggestive of a background of mild interstitial pulmonary edema. Upper Abdomen: Left adrenal gland is incompletely visualize, but appears diffusely enlarged and low-attenuation, indicative of adenomatous hyperplasia. Aortic atherosclerosis. Musculoskeletal: There are no aggressive appearing lytic or blastic lesions noted in the visualized portions of the skeleton. IMPRESSION: 1. The appearance of the chest suggests underlying congestive heart failure. This is based on the presence of cardiomegaly, bilateral pleural effusions and what appears to be pulmonary edema. 2. In addition, there are areas of atelectasis in the lung bases bilaterally. The possibility of infectious airspace consolidation, particularly in the right lower lobe, should also be considered if the patient is exhibiting signs are symptoms of pneumonia. 3. Mediastinal and right axillary lymphadenopathy slightly increased compared to prior study 09/20/2015. Given the chronicity of this, this is favored to be benign, however, clinical correlation for signs and symptoms of lymphoproliferative disorder is suggested. 4. Aortic atherosclerosis, in addition to 2 vessel coronary artery disease. Please note that although the presence of coronary artery calcium documents the presence of coronary artery disease, the severity of this disease and any potential stenosis cannot be assessed on this non-gated CT examination. Assessment for potential risk factor modification, dietary therapy or pharmacologic therapy may be warranted, if clinically indicated. 5. Additional incidental findings, as above. Electronically Signed   By: Vinnie Langton M.D.   On: 08/27/2016 13:17   Dg Chest Port 1 View  Result Date: 08/27/2016 CLINICAL DATA:  Exacerbation of CHF, shortness of breath EXAM: PORTABLE CHEST 1 VIEW COMPARISON:  Chest  x-ray of 08/26/2016 FINDINGS: The lungs are not well aerated. There are bibasilar opacities with probable small effusions and cardiomegaly. This pattern is most consistent with congestive heart failure although pneumonia involving the lung bases cannot be excluded and followup is recommended. Moderate thoracic aortic atherosclerosis is noted. No bony abnormality is seen. IMPRESSION: 1. Diminished aeration with probable increasing CHF and effusions. Cannot exclude basilar pneumonia. 2. Moderate thoracic aortic atherosclerosis. Electronically Signed   By: Ivar Drape M.D.   On: 08/27/2016 08:58        Scheduled Meds: . busPIRone  10 mg Oral BID  . diltiazem  60 mg Oral Q8H  . enoxaparin (LOVENOX) injection  50 mg Subcutaneous Q12H  . folic acid  1 mg Oral Daily  . furosemide  40 mg Intravenous BID  . gabapentin  300 mg Oral TID  .  multivitamin with minerals  1 tablet Oral Daily  . thiamine  100 mg Oral Daily   Or  . thiamine  100 mg Intravenous Daily   Continuous Infusions:   LOS: 1 day    Time spent: 25 min    [place], DO [facility] Pager [phone]  If 7PM-7AM, please contact night-coverage [email or web address] Password [facility] 08/28/2016, 10:41 AM

## 2016-08-28 NOTE — Progress Notes (Signed)
Offered Pt a bath, PT stated she would like to wait until later.

## 2016-08-28 NOTE — Progress Notes (Signed)
ANTICOAGULATION CONSULT NOTE - Follow Up Consult  Pharmacy Consult for Lovenox  Indication: atrial fibrillation  No Known Allergies  Patient Measurements: Height: 5\' 1"  (154.9 cm) Weight: 111 lb 11.2 oz (50.7 kg) IBW/kg (Calculated) : 47.8 Lovenox Dosing Weight: 50.7 kg  Vital Signs: Temp: 98.2 F (36.8 C) (02/21 0808) Temp Source: Oral (02/21 0808) BP: 143/76 (02/21 0808) Pulse Rate: 63 (02/21 0808)  Labs:  Recent Labs  08/26/16 1101 08/26/16 1439 08/26/16 1853 08/27/16 0210 08/28/16 0529  HGB 12.7  --   --  10.9* 11.3*  HCT 39.6  --   --  34.5* 35.7*  PLT 355  --   --  362 370  LABPROT  --   --   --  14.7  --   INR  --   --   --  1.14  --   CREATININE 1.07*  --   --  1.45* 1.13*  TROPONINI  --  <0.03 <0.03 <0.03  --     Estimated Creatinine Clearance: 37.5 mL/min (by C-G formula based on SCr of 1.13 mg/dL (H)).  Assessment:   66 yr old female started on Lovenox for atrial fibrillation on 08/27/16.   Creatinine up to 1.45 yesterday, so begun on q24h regimen.  Creatininde back down to 1.13 today, now quailfies for q12h dosing.  Goal of Therapy:  Anti-Xa level 0.6-1 units/ml 4hrs after LMWH dose given Monitor platelets by anticoagulation protocol: Yes   Plan:   Lovenox 50 mg sq adjusted from q24h to q12hrs this morning.  Follow renal function, intermittent CBC and anticoagulation plan.  Arty Baumgartner, [place] Pager: [phone] 08/28/2016,11:49 AM

## 2016-08-28 NOTE — Progress Notes (Signed)
Progress Note  Patient Name: RUTHANN ROATH Date of Encounter: 08/28/2016  Primary Cardiologist: Marlou Porch    Subjective   L chest hurts to breath  Breathing is fair    Inpatient Medications    Scheduled Meds: . busPIRone  10 mg Oral BID  . diltiazem  60 mg Oral Q8H  . enoxaparin (LOVENOX) injection  50 mg Subcutaneous Q12H  . folic acid  1 mg Oral Daily  . furosemide  40 mg Intravenous BID  . gabapentin  300 mg Oral TID  . multivitamin with minerals  1 tablet Oral Daily  . thiamine  100 mg Oral Daily   Or  . thiamine  100 mg Intravenous Daily   Continuous Infusions:  PRN Meds: acetaminophen **OR** acetaminophen, albuterol, bisacodyl, HYDROcodone-acetaminophen, LORazepam **OR** LORazepam, magnesium citrate, ondansetron **OR** ondansetron (ZOFRAN) IV, senna-docusate   Vital Signs    Vitals:   08/27/16 2300 08/28/16 0402 08/28/16 0405 08/28/16 0808  BP: 129/79  132/83 (!) 143/76  Pulse: 62  74 63  Resp: (!) 29  (!) 27 (!) 23  Temp: 98.4 F (36.9 C)  98.5 F (36.9 C) 98.2 F (36.8 C)  TempSrc: Oral  Oral Oral  SpO2: 96%  (!) 88% (!) 88%  Weight:  111 lb 11.2 oz (50.7 kg)    Height:        Intake/Output Summary (Last 24 hours) at 08/28/16 1105 Last data filed at 08/28/16 0809  Gross per 24 hour  Intake              632 ml  Output              600 ml  Net               32 ml   Filed Weights   08/27/16 0500 08/28/16 0402  Weight: 114 lb 14.4 oz (52.1 kg) 111 lb 11.2 oz (50.7 kg)    Telemetry    Atrial fib    ECG     Physical Exam   GEN: No acute distress.   Neck: JVP is increased  Cardiac:  Irreg irreg  , no murmurs, rubs, or gallops.  Respiratory:  Rales at bases   GI: Soft, nontender, non-distended  MS: No edema; No deformity. Neuro:  Nonfocal  Psych: Normal affect   Labs    Chemistry Recent Labs Lab 08/26/16 1101 08/27/16 0210 08/28/16 0529  NA 140 136 134*  K 4.2 3.4* 3.8  CL 106 102 99*  CO2 24 23 25   GLUCOSE 93 127* 101*  BUN  16 26* 20  CREATININE 1.07* 1.45* 1.13*  CALCIUM 9.0 8.3* 8.5*  PROT 7.8 6.8  --   ALBUMIN 2.3* 2.0*  --   AST 29 25  --   ALT 20 17  --   ALKPHOS 104 88  --   BILITOT 0.4 0.5  --   GFRNONAA 53* 37* 50*  GFRAA >60 43* 58*  ANIONGAP 10 11 10      Hematology Recent Labs Lab 08/26/16 1101 08/27/16 0210 08/28/16 0529  WBC 5.7 7.0 6.1  RBC 4.37 3.82* 3.92  HGB 12.7 10.9* 11.3*  HCT 39.6 34.5* 35.7*  MCV 90.6 90.3 91.1  MCH 29.1 28.5 28.8  MCHC 32.1 31.6 31.7  RDW 15.5 15.2 15.1  PLT 355 362 370    Cardiac Enzymes Recent Labs Lab 08/26/16 1439 08/26/16 1853 08/27/16 0210  TROPONINI <0.03 <0.03 <0.03    Recent Labs Lab 08/26/16 1112  TROPIPOC 0.02  BNP Recent Labs Lab 08/26/16 1101  BNP 843.9*     DDimer No results for input(s): DDIMER in the last 168 hours.   Radiology    Ct Chest Wo Contrast  Result Date: 08/27/2016 CLINICAL DATA:  66 year old female with history of hypoxia. Shortness of breath. EXAM: CT CHEST WITHOUT CONTRAST TECHNIQUE: Multidetector CT imaging of the chest was performed following the standard protocol without IV contrast. COMPARISON:  Chest CT 09/20/2015. FINDINGS: Cardiovascular: Heart size is mildly enlarged. Small amount of pericardial fluid and/or thickening. No pericardial calcification. There is aortic atherosclerosis, as well as atherosclerosis of the great vessels of the mediastinum and the coronary arteries, including calcified atherosclerotic plaque in the left anterior descending and right coronary arteries. Calcifications of the aortic valve (mild). Mediastinum/Nodes: Multiple enlarged mediastinal lymph nodes. The largest of these include a 1.9 cm short axis prevascular lymph node (image 65 of series 3) and a 1.6 cm short axis low right paratracheal lymph node (image 71 of series 3). Esophagus is unremarkable in appearance. Several enlarged right axillary lymph nodes are also noted measuring up to 11 mm in short axis. Surgical  clips in the left axilla, presumably from prior lymph node dissection. Lungs/Pleura: Moderate to large right and small left pleural effusions lying dependently. There are extensive dependent opacities in the lower lobes of the lungs bilaterally which are predominantly related to passive subsegmental atelectasis, although underlying airspace consolidation is also suspected (poorly evaluated on today's noncontrast CT, particularly in the right lower lobe. Examination) Mild diffuse bronchial wall thickening with mild centrilobular and paraseptal emphysema. Mild diffuse ground-glass attenuation with some interlobular septal thickening, suggestive of a background of mild interstitial pulmonary edema. Upper Abdomen: Left adrenal gland is incompletely visualize, but appears diffusely enlarged and low-attenuation, indicative of adenomatous hyperplasia. Aortic atherosclerosis. Musculoskeletal: There are no aggressive appearing lytic or blastic lesions noted in the visualized portions of the skeleton. IMPRESSION: 1. The appearance of the chest suggests underlying congestive heart failure. This is based on the presence of cardiomegaly, bilateral pleural effusions and what appears to be pulmonary edema. 2. In addition, there are areas of atelectasis in the lung bases bilaterally. The possibility of infectious airspace consolidation, particularly in the right lower lobe, should also be considered if the patient is exhibiting signs are symptoms of pneumonia. 3. Mediastinal and right axillary lymphadenopathy slightly increased compared to prior study 09/20/2015. Given the chronicity of this, this is favored to be benign, however, clinical correlation for signs and symptoms of lymphoproliferative disorder is suggested. 4. Aortic atherosclerosis, in addition to 2 vessel coronary artery disease. Please note that although the presence of coronary artery calcium documents the presence of coronary artery disease, the severity of this  disease and any potential stenosis cannot be assessed on this non-gated CT examination. Assessment for potential risk factor modification, dietary therapy or pharmacologic therapy may be warranted, if clinically indicated. 5. Additional incidental findings, as above. Electronically Signed   By: Vinnie Langton M.D.   On: 08/27/2016 13:17   Dg Chest Port 1 View  Result Date: 08/27/2016 CLINICAL DATA:  Exacerbation of CHF, shortness of breath EXAM: PORTABLE CHEST 1 VIEW COMPARISON:  Chest x-ray of 08/26/2016 FINDINGS: The lungs are not well aerated. There are bibasilar opacities with probable small effusions and cardiomegaly. This pattern is most consistent with congestive heart failure although pneumonia involving the lung bases cannot be excluded and followup is recommended. Moderate thoracic aortic atherosclerosis is noted. No bony abnormality is seen. IMPRESSION: 1. Diminished aeration with  probable increasing CHF and effusions. Cannot exclude basilar pneumonia. 2. Moderate thoracic aortic atherosclerosis. Electronically Signed   By: Ivar Drape M.D.   On: 08/27/2016 08:58    Cardiac Studies    Patient Profile     66 y.o. female hx of breast CA(s/p XRT), COPD (recent admit for exacerbation/infection), GERD, cocaine use , HTN and HL  Admited with SOB, CHF, COPD  New Afib   Assessment & Plan   1  Acute on chronic diastolic CHF    Echo in Jan 2018 LVEF 65 to 70%    Vol still appers increae  Iwould increase lasix for 0ne dose  Follow respons    2   Atrial fibrillation  CHADSVASC of 4.  HR is improved   Not an anticoag candidate though due to noncompliance, drug use    3  Chest pain  Repeat CXR  Pleuriitc    Signed, Dorris Carnes, MD  08/28/2016, 11:05 AM

## 2016-08-29 DIAGNOSIS — I5043 Acute on chronic combined systolic (congestive) and diastolic (congestive) heart failure: Secondary | ICD-10-CM

## 2016-08-29 LAB — CBC
HCT: 38.5 % (ref 36.0–46.0)
Hemoglobin: 12.3 g/dL (ref 12.0–15.0)
MCH: 28.7 pg (ref 26.0–34.0)
MCHC: 31.9 g/dL (ref 30.0–36.0)
MCV: 90 fL (ref 78.0–100.0)
Platelets: 419 10*3/uL — ABNORMAL HIGH (ref 150–400)
RBC: 4.28 MIL/uL (ref 3.87–5.11)
RDW: 14.4 % (ref 11.5–15.5)
WBC: 6.8 10*3/uL (ref 4.0–10.5)

## 2016-08-29 LAB — BASIC METABOLIC PANEL
Anion gap: 9 (ref 5–15)
BUN: 22 mg/dL — AB (ref 6–20)
CHLORIDE: 98 mmol/L — AB (ref 101–111)
CO2: 27 mmol/L (ref 22–32)
CREATININE: 1.25 mg/dL — AB (ref 0.44–1.00)
Calcium: 8.8 mg/dL — ABNORMAL LOW (ref 8.9–10.3)
GFR calc Af Amer: 51 mL/min — ABNORMAL LOW (ref >60)
GFR calc non Af Amer: 44 mL/min — ABNORMAL LOW (ref >60)
GLUCOSE: 118 mg/dL — AB (ref 65–99)
POTASSIUM: 3.4 mmol/L — AB (ref 3.5–5.1)
SODIUM: 134 mmol/L — AB (ref 135–145)

## 2016-08-29 MED ORDER — POTASSIUM CHLORIDE CRYS ER 20 MEQ PO TBCR
40.0000 meq | EXTENDED_RELEASE_TABLET | Freq: Once | ORAL | Status: AC
Start: 2016-08-29 — End: 2016-08-29
  Administered 2016-08-29: 40 meq via ORAL
  Filled 2016-08-29: qty 2

## 2016-08-29 MED ORDER — DEXTROSE 5 % IV SOLN
1.0000 g | INTRAVENOUS | Status: DC
  Administered 2016-08-29 – 2016-08-30 (×2): 1 g via INTRAVENOUS
  Filled 2016-08-29 (×3): qty 10

## 2016-08-29 MED ORDER — DEXTROSE 5 % IV SOLN
500.0000 mg | INTRAVENOUS | Status: DC
  Administered 2016-08-29 – 2016-08-30 (×2): 500 mg via INTRAVENOUS
  Filled 2016-08-29 (×2): qty 500

## 2016-08-29 MED ORDER — FUROSEMIDE 10 MG/ML IJ SOLN
80.0000 mg | Freq: Two times a day (BID) | INTRAMUSCULAR | Status: DC
  Administered 2016-08-29 – 2016-08-30 (×3): 80 mg via INTRAVENOUS
  Filled 2016-08-29 (×3): qty 8

## 2016-08-29 NOTE — Progress Notes (Signed)
Progress Note  Patient Name: Victoria Burke Date of Encounter: 08/29/2016  Primary Cardiologist: Dr Marlou Porch  Patient Profile     66 y.o. female w/ hx breast CA s/p radiation in 2013, COPD w/ recent admission for acute exacerbation in the setting of flu and bacterial PNA,GERD, h/o gastric ulcer, cocaine use,HTN and HLD. Admit after cocaine w/ tachycardia>>MAT, EF nl on echo. Admit 02/19 w/ SOB, CHF & COPD, now in atrial fib (recent cocaine use).   Subjective   Not breathing much better  Inpatient Medications    Scheduled Meds: . busPIRone  10 mg Oral BID  . diltiazem  60 mg Oral Q8H  . enoxaparin (LOVENOX) injection  50 mg Subcutaneous Q12H  . folic acid  1 mg Oral Daily  . gabapentin  300 mg Oral TID  . multivitamin with minerals  1 tablet Oral Daily  . thiamine  100 mg Oral Daily   Continuous Infusions:  PRN Meds: acetaminophen **OR** acetaminophen, albuterol, bisacodyl, HYDROcodone-acetaminophen, LORazepam **OR** LORazepam, magnesium citrate, ondansetron **OR** ondansetron (ZOFRAN) IV, senna-docusate   Vital Signs    Vitals:   08/28/16 2342 08/28/16 2348 08/29/16 0400 08/29/16 0743  BP:  (!) 148/78 136/80 (!) 147/71  Pulse:   63   Resp:   (!) 22 (!) 22  Temp: (!) 100.5 F (38.1 C)  98.5 F (36.9 C)   TempSrc: Oral  Oral   SpO2:   96% 95%  Weight:   110 lb 3.7 oz (50 kg)   Height:        Intake/Output Summary (Last 24 hours) at 08/29/16 0747 Last data filed at 08/28/16 2300  Gross per 24 hour  Intake              360 ml  Output             2600 ml  Net            -2240 ml   Filed Weights   08/27/16 0500 08/28/16 0402 08/29/16 0400  Weight: 114 lb 14.4 oz (52.1 kg) 111 lb 11.2 oz (50.7 kg) 110 lb 3.7 oz (50 kg)    Telemetry    In/out SR, atrial ectopy and atrial fib - Personally Reviewed  ECG    n/a - Personally Reviewed  Physical Exam   General: Well developed, thin, female appearing in no acute distress. Head: Normocephalic,  atraumatic.  Neck: Supple without bruits, JVD 10 cm. Lungs:  Resp regular and unlabored, bibasilar rales and rhonchi. Heart: Irreg R&R, S1, S2, no S3, S4, or murmur; no rub. Abdomen: Soft, non-tender, non-distended with normoactive bowel sounds. No hepatomegaly. No rebound/guarding. No obvious abdominal masses. Extremities: No clubbing, cyanosis, no edema. Distal pedal pulses are 2+ bilaterally. Neuro: Alert and oriented X 3. Moves all extremities spontaneously. Psych: Normal affect.  Labs    Hematology Recent Labs Lab 08/27/16 0210 08/28/16 0529 08/29/16 0325  WBC 7.0 6.1 6.8  RBC 3.82* 3.92 4.28  HGB 10.9* 11.3* 12.3  HCT 34.5* 35.7* 38.5  MCV 90.3 91.1 90.0  MCH 28.5 28.8 28.7  MCHC 31.6 31.7 31.9  RDW 15.2 15.1 14.4  PLT 362 370 419*    Chemistry Recent Labs Lab 08/26/16 1101 08/27/16 0210 08/28/16 0529 08/29/16 0325  NA 140 136 134* 134*  K 4.2 3.4* 3.8 3.4*  CL 106 102 99* 98*  CO2 24 23 25 27   GLUCOSE 93 127* 101* 118*  BUN 16 26* 20 22*  CREATININE 1.07* 1.45* 1.13* 1.25*  CALCIUM 9.0 8.3* 8.5* 8.8*  PROT 7.8 6.8  --   --   ALBUMIN 2.3* 2.0*  --   --   AST 29 25  --   --   ALT 20 17  --   --   ALKPHOS 104 88  --   --   BILITOT 0.4 0.5  --   --   GFRNONAA 53* 37* 50* 44*  GFRAA >60 43* 58* 51*  ANIONGAP 10 11 10 9      Cardiac Enzymes Recent Labs Lab 08/26/16 1439 08/26/16 1853 08/27/16 0210  TROPONINI <0.03 <0.03 <0.03    Recent Labs Lab 08/26/16 1112  TROPIPOC 0.02     BNP Recent Labs Lab 08/26/16 1101  BNP 843.9*     Radiology    Dg Chest 2 View Result Date: 08/28/2016 CLINICAL DATA:  One month of chest tightness, left-sided chest pain, and shortness of breath. Recent flu and pneumonia. History of ihypertension, current smoker. EXAM: CHEST  2 VIEW COMPARISON:  Chest X ray and chest CT scan of August 27, 2016. FINDINGS: The lungs are reasonably well inflated. There are bilateral pleural effusions greatest on the right. There is  perihilar and infrahilar atelectasis or pneumonia. There is thickening of the minor fissure. The interstitial markings of both lungs are increased in the mid and upper lobes in this is stable. The cardiac silhouette remains enlarged. The pulmonary vascularity is less engorged and more distinct. There is calcification in the wall of the thoracic aorta. The bony thorax exhibits no acute abnormality. IMPRESSION: Bibasilar pneumonia with small bilateral pleural effusions. CHF with mild pulmonary interstitial edema. There has been slight overall improvement in the appearance of the chest since yesterday's study. Thoracic aortic atherosclerosis. Electronically Signed   By: David  Martinique M.D.   On: 08/28/2016 15:03   Dg Chest 2 View Result Date: 08/26/2016 CLINICAL DATA:  Short of breath. EXAM: CHEST  2 VIEW COMPARISON:  07/30/2016 FINDINGS: Cardiac enlargement.  Pulmonary vascular congestion. Bibasilar airspace disease with progression since the prior study. Small bilateral pleural effusions. Atherosclerotic aorta with calcification. IMPRESSION: Progression of bilateral airspace disease since the prior study. Progression of small bilateral effusions. This pattern could be seen with diffuse pneumonia versus congestive heart failure and edema. Electronically Signed   By: Franchot Gallo M.D.   On: 08/26/2016 10:44   Ct Chest Wo Contrast Result Date: 08/27/2016 CLINICAL DATA:  66 year old female with history of hypoxia. Shortness of breath. EXAM: CT CHEST WITHOUT CONTRAST TECHNIQUE: Multidetector CT imaging of the chest was performed following the standard protocol without IV contrast. COMPARISON:  Chest CT 09/20/2015. FINDINGS: Cardiovascular: Heart size is mildly enlarged. Small amount of pericardial fluid and/or thickening. No pericardial calcification. There is aortic atherosclerosis, as well as atherosclerosis of the great vessels of the mediastinum and the coronary arteries, including calcified atherosclerotic  plaque in the left anterior descending and right coronary arteries. Calcifications of the aortic valve (mild). Mediastinum/Nodes: Multiple enlarged mediastinal lymph nodes. The largest of these include a 1.9 cm short axis prevascular lymph node (image 65 of series 3) and a 1.6 cm short axis low right paratracheal lymph node (image 71 of series 3). Esophagus is unremarkable in appearance. Several enlarged right axillary lymph nodes are also noted measuring up to 11 mm in short axis. Surgical clips in the left axilla, presumably from prior lymph node dissection. Lungs/Pleura: Moderate to large right and small left pleural effusions lying dependently. There are extensive dependent opacities in the lower lobes of  the lungs bilaterally which are predominantly related to passive subsegmental atelectasis, although underlying airspace consolidation is also suspected (poorly evaluated on today's noncontrast CT, particularly in the right lower lobe. Examination) Mild diffuse bronchial wall thickening with mild centrilobular and paraseptal emphysema. Mild diffuse ground-glass attenuation with some interlobular septal thickening, suggestive of a background of mild interstitial pulmonary edema. Upper Abdomen: Left adrenal gland is incompletely visualize, but appears diffusely enlarged and low-attenuation, indicative of adenomatous hyperplasia. Aortic atherosclerosis. Musculoskeletal: There are no aggressive appearing lytic or blastic lesions noted in the visualized portions of the skeleton. IMPRESSION: 1. The appearance of the chest suggests underlying congestive heart failure. This is based on the presence of cardiomegaly, bilateral pleural effusions and what appears to be pulmonary edema. 2. In addition, there are areas of atelectasis in the lung bases bilaterally. The possibility of infectious airspace consolidation, particularly in the right lower lobe, should also be considered if the patient is exhibiting signs are  symptoms of pneumonia. 3. Mediastinal and right axillary lymphadenopathy slightly increased compared to prior study 09/20/2015. Given the chronicity of this, this is favored to be benign, however, clinical correlation for signs and symptoms of lymphoproliferative disorder is suggested. 4. Aortic atherosclerosis, in addition to 2 vessel coronary artery disease. Please note that although the presence of coronary artery calcium documents the presence of coronary artery disease, the severity of this disease and any potential stenosis cannot be assessed on this non-gated CT examination. Assessment for potential risk factor modification, dietary therapy or pharmacologic therapy may be warranted, if clinically indicated. 5. Additional incidental findings, as above. Electronically Signed   By: Vinnie Langton M.D.   On: 08/27/2016 13:17   Dg Chest Port 1 View Result Date: 08/27/2016 CLINICAL DATA:  Exacerbation of CHF, shortness of breath EXAM: PORTABLE CHEST 1 VIEW COMPARISON:  Chest x-ray of 08/26/2016 FINDINGS: The lungs are not well aerated. There are bibasilar opacities with probable small effusions and cardiomegaly. This pattern is most consistent with congestive heart failure although pneumonia involving the lung bases cannot be excluded and followup is recommended. Moderate thoracic aortic atherosclerosis is noted. No bony abnormality is seen. IMPRESSION: 1. Diminished aeration with probable increasing CHF and effusions. Cannot exclude basilar pneumonia. 2. Moderate thoracic aortic atherosclerosis. Electronically Signed   By: Ivar Drape M.D.   On: 08/27/2016 08:58     Cardiac Studies   None since echo  Patient Profile     66 y.o. female w/ hx breast CA s/p radiation in 2013, COPD w/ recent admission for acute exacerbation in the setting of flu and bacterial PNA,GERD, h/o gastric ulcer, cocaine use,HTN and HLD. Admit after cocaine w/ tachycardia>>MAT, EF nl on echo. Admit 02/19 w/ SOB, CHF & COPD,  now in atrial fib (recent cocaine use).   Assessment & Plan     1  Acute on chronic diastolic CHF    Echo in Jan 2018 LVEF 65 to 70%, I/O neg 1.8 L since admit but record is incomplete. Wt down to 110 lb. Got Lasix 80 mg IV 01/21  Vol still appers increased. Continue IV Lasix.  Follow response    2   Rhythm  Current with SR with PACs(frequent)  Rates [place]  3  Chest pain  PNA and CHF on CXR,  Pleuriitc pain, Painfult with palpitation of chest No further eval planned.  Otherwise, per IM Principal Problem:   Respiratory failure, acute-on-chronic ([place]) Active Problems:   Chronic obstructive pulmonary disease (HCC)   Acute on chronic congestive heart  failure (HCC)   Atrial fibrillation with rapid ventricular response (H[facility])   Breast cancer of upper-outer quadrant of left female breast ([place])   Gout   Depression   Tobacco abuse   Cocaine abuse   Essential hypertension   Generalized anxiety disorder   CHF exacerbation ([place])    Signed, Lenoard Aden 7:47 AM 08/29/2016 Pager: [phone]  Pt seen and examined  I have amended note above by R Barrett Pt diruesed some yesterday  Volume is improved some   Lungs with rhonchi, rales at bases  Cardiac:  RRR with freq skips  Chest  Tender  Ext without edema  REcommendations  WOuld continue IV lasix Follow I/O  And Cr.    Dorris Carnes

## 2016-08-29 NOTE — Plan of Care (Addendum)
Problem: Safety: Goal: Ability to remain free from injury will improve Outcome: Progressing Pt bed in lowest position, call light in reach and pt demonstrated use and verbalized that she would call before getting up. Pt refused socks and said she would " put them on by herself later." Will continue to monitor pt and perform hourly rounding.   Problem: Health Behavior/Discharge Planning: Goal: Ability to manage health-related needs will improve Outcome: Not Progressing Pt refused oral care. Explained benefits of oral care. Pt encouraged to perform and call when ready and that I would set-up and assist her if she would like. Pt said she would call if she changed her mind. Will continue to encourage pt.

## 2016-08-29 NOTE — Progress Notes (Signed)
PROGRESS NOTE    Victoria Burke  [id number] DOB: Jun 06, 1951 DOA: 08/26/2016 PCP: Dorena Dew, FNP   Outpatient Specialists:     Brief Narrative:  Victoria Burke is a 65 y.o. female who presents with acute respiratory failure likely secondary to CHF +/- pneumonia in the setting of chronic COPD with continues tobacco use though no home O2 requirement and recent pneumonia. Patient's cardiac condition will be difficult to manage given her ongoing use of cocaine.    Assessment & Plan:   Principal Problem:   Respiratory failure, acute-on-chronic (HCC) Active Problems:   Breast cancer of upper-outer quadrant of left female breast ([place])   Gout   Depression   Tobacco abuse   Cocaine abuse   Chronic obstructive pulmonary disease (HCC)   Essential hypertension   Generalized anxiety disorder   Acute on chronic congestive heart failure (HCC)   Atrial fibrillation with rapid ventricular response (HCC)   CHF exacerbation (HCC)   Acute hypoxic respiratory failure likely secondary to acute on chronic diastolic CHF exacerbation and Afib with RVR  in the setting of cocaine abuse, last consumed 2 days prior to admisision .  -CT Scan shows volume overload continues -  Daily weights and strict I/O  - ACEI, Ca Ch Blocker and BB as per Cards  - Lasix  IV -patient had fever yesterday so will start IV abx  Atrial Fibrillation  -lovenox for blood thinner-- doubt patient would be reliable at home for NOAC -IV cardizem converted to PO cardizem   Hyperlipidemia Continue home statins  COPD with no signs of exacerbation    Continue home meds  -continues to smoke- cessation offered  Breast Cancer Not on Arimidex  Follow with Dr. Burr Medico as outpatient  GERD, no acute symptoms Continue PPI  Anemia of chronic disease, no acute issues noted   Tobacco and cocaine, moderate alcohol  abuse . -  Nicotine patch offered, patient declines  -  Counseled cessation for polysubstance CIWA    Anxiety and Depression Continue Xanax for anxiety and Buspar  for depression   Hypokalemia -repleted -monitor with the continues use of IV lasix  AKI -Cr stable -monitor renal function with continued diuresis with IV lasix  DVT prophylaxis:  Fully anticoagulated- lovenox   Code Status: Full Code   Family Communication: Called son, no answer 2/21  Disposition Plan:  Home once diuresed and off O2   Consultants:   cards     Subjective: Thinks her breathing has improved  Objective: Vitals:   08/28/16 2342 08/28/16 2348 08/29/16 0400 08/29/16 0743  BP:  (!) 148/78 136/80 (!) 147/71  Pulse:   63   Resp:   (!) 22 (!) 22  Temp: (!) 100.5 F (38.1 C)  98.5 F (36.9 C) 98.4 F (36.9 C)  TempSrc: Oral  Oral Oral  SpO2:   96% 95%  Weight:   50 kg (110 lb 3.7 oz)   Height:        Intake/Output Summary (Last 24 hours) at 08/29/16 0915 Last data filed at 08/28/16 2300  Gross per 24 hour  Intake              240 ml  Output             1800 ml  Net            -1560 ml   Filed Weights   08/27/16 0500 08/28/16 0402 08/29/16 0400  Weight: 52.1 kg (114 lb 14.4 oz) 50.7 kg (111  lb 11.2 oz) 50 kg (110 lb 3.7 oz)    Examination:  General exam: Appears calm and comfortable  Respiratory system: diminished, no wheezing-- crackles in bases b/l Cardiovascular system: irregular Gastrointestinal system: Abdomen is nondistended, soft and nontender. No organomegaly or masses felt. Normal bowel sounds heard. Central nervous system: Alert and oriented. No focal neurological deficits. Flat affect    Data Reviewed: I have personally reviewed following labs and imaging studies  CBC:  Recent Labs Lab 08/26/16 1101 08/27/16 0210 08/28/16 0529 08/29/16 0325  WBC 5.7 7.0 6.1 6.8  NEUTROABS 3.7  --   --   --   HGB 12.7 10.9* 11.3* 12.3  HCT 39.6 34.5* 35.7* 38.5  MCV 90.6 90.3 91.1 90.0  PLT 355 362 370 [id number]*   Basic Metabolic Panel:  Recent Labs Lab  08/26/16 1101 08/27/16 0210 08/28/16 0529 08/29/16 0325  NA 140 136 134* 134*  K 4.2 3.4* 3.8 3.4*  CL 106 102 99* 98*  CO2 24 23 25 27   GLUCOSE 93 127* 101* 118*  BUN 16 26* 20 22*  CREATININE 1.07* 1.45* 1.13* 1.25*  CALCIUM 9.0 8.3* 8.5* 8.8*   GFR: Estimated Creatinine Clearance: 33.9 mL/min (by C-G formula based on SCr of 1.25 mg/dL (H)). Liver Function Tests:  Recent Labs Lab 08/26/16 1101 08/27/16 0210  AST 29 25  ALT 20 17  ALKPHOS 104 88  BILITOT 0.4 0.5  PROT 7.8 6.8  ALBUMIN 2.3* 2.0*   No results for input(s): LIPASE, AMYLASE in the last 168 hours. No results for input(s): AMMONIA in the last 168 hours. Coagulation Profile:  Recent Labs Lab 08/27/16 0210  INR 1.14   Cardiac Enzymes:  Recent Labs Lab 08/26/16 1439 08/26/16 1853 08/27/16 0210  TROPONINI <0.03 <0.03 <0.03   BNP (last 3 results) No results for input(s): PROBNP in the last 8760 hours. HbA1C: No results for input(s): HGBA1C in the last 72 hours. CBG: No results for input(s): GLUCAP in the last 168 hours. Lipid Profile: No results for input(s): CHOL, HDL, LDLCALC, TRIG, CHOLHDL, LDLDIRECT in the last 72 hours. Thyroid Function Tests: No results for input(s): TSH, T4TOTAL, FREET4, T3FREE, THYROIDAB in the last 72 hours. Anemia Panel: No results for input(s): VITAMINB12, FOLATE, FERRITIN, TIBC, IRON, RETICCTPCT in the last 72 hours. Urine analysis:    Component Value Date/Time   COLORURINE YELLOW 07/24/2016 2100   APPEARANCEUR CLEAR 07/24/2016 2100   LABSPEC 1.013 07/24/2016 2100   PHURINE 5.0 07/24/2016 2100   GLUCOSEU NEGATIVE 07/24/2016 2100   HGBUR SMALL (A) 07/24/2016 2100   [place] NEGATIVE 07/24/2016 2100   KETONESUR NEGATIVE 07/24/2016 2100   PROTEINUR >=300 (A) 07/24/2016 2100   UROBILINOGEN 0.2 06/25/2016 1507   NITRITE NEGATIVE 07/24/2016 2100   LEUKOCYTESUR NEGATIVE 07/24/2016 2100     )No results found for this or any previous visit (from the past 240  hour(s)).    Anti-infectives    Start     Dose/Rate Route Frequency Ordered Stop   08/29/16 0900  cefTRIAXone (ROCEPHIN) 1 g in dextrose 5 % 50 mL IVPB     1 g 100 mL/hr over 30 Minutes Intravenous Every 24 hours 08/29/16 0803     08/29/16 0900  azithromycin (ZITHROMAX) 500 mg in dextrose 5 % 250 mL IVPB     500 mg 250 mL/hr over 60 Minutes Intravenous Every 24 hours 08/29/16 0803         Radiology Studies: Dg Chest 2 View  Result Date: 08/28/2016 CLINICAL DATA:  One month of chest tightness, left-sided chest pain, and shortness of breath. Recent flu and pneumonia. History of ihypertension, current smoker. EXAM: CHEST  2 VIEW COMPARISON:  Chest X ray and chest CT scan of August 27, 2016. FINDINGS: The lungs are reasonably well inflated. There are bilateral pleural effusions greatest on the right. There is perihilar and infrahilar atelectasis or pneumonia. There is thickening of the minor fissure. The interstitial markings of both lungs are increased in the mid and upper lobes in this is stable. The cardiac silhouette remains enlarged. The pulmonary vascularity is less engorged and more distinct. There is calcification in the wall of the thoracic aorta. The bony thorax exhibits no acute abnormality. IMPRESSION: Bibasilar pneumonia with small bilateral pleural effusions. CHF with mild pulmonary interstitial edema. There has been slight overall improvement in the appearance of the chest since yesterday's study. Thoracic aortic atherosclerosis. Electronically Signed   By: David  Martinique M.D.   On: 08/28/2016 15:03   Ct Chest Wo Contrast  Result Date: 08/27/2016 CLINICAL DATA:  66 year old female with history of hypoxia. Shortness of breath. EXAM: CT CHEST WITHOUT CONTRAST TECHNIQUE: Multidetector CT imaging of the chest was performed following the standard protocol without IV contrast. COMPARISON:  Chest CT 09/20/2015. FINDINGS: Cardiovascular: Heart size is mildly enlarged. Small amount of  pericardial fluid and/or thickening. No pericardial calcification. There is aortic atherosclerosis, as well as atherosclerosis of the great vessels of the mediastinum and the coronary arteries, including calcified atherosclerotic plaque in the left anterior descending and right coronary arteries. Calcifications of the aortic valve (mild). Mediastinum/Nodes: Multiple enlarged mediastinal lymph nodes. The largest of these include a 1.9 cm short axis prevascular lymph node (image 65 of series 3) and a 1.6 cm short axis low right paratracheal lymph node (image 71 of series 3). Esophagus is unremarkable in appearance. Several enlarged right axillary lymph nodes are also noted measuring up to 11 mm in short axis. Surgical clips in the left axilla, presumably from prior lymph node dissection. Lungs/Pleura: Moderate to large right and small left pleural effusions lying dependently. There are extensive dependent opacities in the lower lobes of the lungs bilaterally which are predominantly related to passive subsegmental atelectasis, although underlying airspace consolidation is also suspected (poorly evaluated on today's noncontrast CT, particularly in the right lower lobe. Examination) Mild diffuse bronchial wall thickening with mild centrilobular and paraseptal emphysema. Mild diffuse ground-glass attenuation with some interlobular septal thickening, suggestive of a background of mild interstitial pulmonary edema. Upper Abdomen: Left adrenal gland is incompletely visualize, but appears diffusely enlarged and low-attenuation, indicative of adenomatous hyperplasia. Aortic atherosclerosis. Musculoskeletal: There are no aggressive appearing lytic or blastic lesions noted in the visualized portions of the skeleton. IMPRESSION: 1. The appearance of the chest suggests underlying congestive heart failure. This is based on the presence of cardiomegaly, bilateral pleural effusions and what appears to be pulmonary edema. 2. In  addition, there are areas of atelectasis in the lung bases bilaterally. The possibility of infectious airspace consolidation, particularly in the right lower lobe, should also be considered if the patient is exhibiting signs are symptoms of pneumonia. 3. Mediastinal and right axillary lymphadenopathy slightly increased compared to prior study 09/20/2015. Given the chronicity of this, this is favored to be benign, however, clinical correlation for signs and symptoms of lymphoproliferative disorder is suggested. 4. Aortic atherosclerosis, in addition to 2 vessel coronary artery disease. Please note that although the presence of coronary artery calcium documents the presence of coronary artery disease, the severity of  this disease and any potential stenosis cannot be assessed on this non-gated CT examination. Assessment for potential risk factor modification, dietary therapy or pharmacologic therapy may be warranted, if clinically indicated. 5. Additional incidental findings, as above. Electronically Signed   By: Vinnie Langton M.D.   On: 08/27/2016 13:17        Scheduled Meds: . azithromycin  500 mg Intravenous Q24H  . busPIRone  10 mg Oral BID  . cefTRIAXone (ROCEPHIN)  IV  1 g Intravenous Q24H  . diltiazem  60 mg Oral Q8H  . enoxaparin (LOVENOX) injection  50 mg Subcutaneous Q12H  . folic acid  1 mg Oral Daily  . furosemide  80 mg Intravenous BID  . gabapentin  300 mg Oral TID  . multivitamin with minerals  1 tablet Oral Daily  . thiamine  100 mg Oral Daily   Continuous Infusions:   LOS: 2 days    Time spent: 25 min    [place], DO [facility] Pager [phone]  If 7PM-7AM, please contact night-coverage [email or web address] Password [email or web address] 08/29/2016, 9:15 AM

## 2016-08-30 ENCOUNTER — Other Ambulatory Visit: Payer: Self-pay

## 2016-08-30 LAB — BASIC METABOLIC PANEL
Anion gap: 12 (ref 5–15)
BUN: 27 mg/dL — ABNORMAL HIGH (ref 6–20)
CHLORIDE: 96 mmol/L — AB (ref 101–111)
CO2: 26 mmol/L (ref 22–32)
Calcium: 9 mg/dL (ref 8.9–10.3)
Creatinine, Ser: 1.33 mg/dL — ABNORMAL HIGH (ref 0.44–1.00)
GFR, EST AFRICAN AMERICAN: 47 mL/min — AB (ref >60)
GFR, EST NON AFRICAN AMERICAN: 41 mL/min — AB (ref >60)
Glucose, Bld: 110 mg/dL — ABNORMAL HIGH (ref 65–99)
POTASSIUM: 3.5 mmol/L (ref 3.5–5.1)
SODIUM: 134 mmol/L — AB (ref 135–145)

## 2016-08-30 LAB — PROCALCITONIN

## 2016-08-30 LAB — MAGNESIUM: MAGNESIUM: 1.6 mg/dL — AB (ref 1.7–2.4)

## 2016-08-30 MED ORDER — LORAZEPAM 2 MG/ML IJ SOLN
0.5000 mg | Freq: Once | INTRAMUSCULAR | Status: AC
  Administered 2016-08-30: 0.5 mg via INTRAVENOUS
  Filled 2016-08-30: qty 1

## 2016-08-30 MED ORDER — AZITHROMYCIN 250 MG PO TABS
500.0000 mg | ORAL_TABLET | Freq: Every day | ORAL | Status: DC
  Administered 2016-08-31: 500 mg via ORAL
  Filled 2016-08-30: qty 2

## 2016-08-30 MED ORDER — POTASSIUM CHLORIDE CRYS ER 20 MEQ PO TBCR
40.0000 meq | EXTENDED_RELEASE_TABLET | Freq: Every day | ORAL | Status: DC
  Administered 2016-08-30: 40 meq via ORAL
  Filled 2016-08-30: qty 2

## 2016-08-30 MED ORDER — FUROSEMIDE 40 MG PO TABS
60.0000 mg | ORAL_TABLET | Freq: Every day | ORAL | Status: DC
  Filled 2016-08-30: qty 1

## 2016-08-30 MED ORDER — MAGNESIUM SULFATE 2 GM/50ML IV SOLN
2.0000 g | Freq: Once | INTRAVENOUS | Status: AC
  Administered 2016-08-30: 2 g via INTRAVENOUS
  Filled 2016-08-30: qty 50

## 2016-08-30 NOTE — Progress Notes (Signed)
PROGRESS NOTE    [name]  [id number] DOB: [birth year] DOA: 08/26/2016 PCP: Dorena Dew, FNP   Outpatient Specialists:     Brief Narrative:  Victoria Burke is a 66 y.o. female who presents with acute respiratory failure likely secondary to CHF +/- pneumonia in the setting of chronic COPD with continues tobacco use though no home O2 requirement and recent pneumonia. Patient's cardiac condition will be difficult to manage given her ongoing use of cocaine.    Assessment & Plan:   Principal Problem:   Respiratory failure, acute-on-chronic (HCC) Active Problems:   Breast cancer of upper-outer quadrant of left female breast ([place])   Gout   Depression   Tobacco abuse   Cocaine abuse   Chronic obstructive pulmonary disease (HCC)   Essential hypertension   Generalized anxiety disorder   Acute on chronic congestive heart failure (HCC)   Atrial fibrillation with rapid ventricular response (HCC)   CHF exacerbation (HCC)   Acute on chronic combined systolic and diastolic CHF (congestive heart failure) (HCC)   Acute hypoxic respiratory failure likely secondary to acute on chronic diastolic CHF exacerbation and Afib with RVR  in the setting of cocaine abuse, last consumed 2 days prior to admisision .  -CT Scan shows volume overload continues -  Daily weights and strict I/O  - ACEI, Ca Ch Blocker and BB as per Cards  - Lasix  IV -patient had fever yesterday so started IV abx  Atrial Fibrillation  -lovenox for blood thinner-- doubt patient would be reliable at home for NOAC -IV cardizem converted to PO cardizem   Hyperlipidemia Continue home statins  COPD with no signs of exacerbation    Continue home meds  -continues to smoke- cessation offered  Breast Cancer Not on Arimidex  Follow with Dr. Burr Medico as outpatient  GERD, no acute symptoms Continue PPI  Anemia of chronic disease, no acute issues noted   Tobacco and cocaine, moderate alcohol  abuse . -   Nicotine patch offered, patient declines  -  Counseled cessation for polysubstance CIWA   Anxiety and Depression Continue Xanax for anxiety and Buspar  for depression   Hypokalemia -repleted -monitor with the continues use of IV lasix  AKI -Cr stable -monitor renal function with continued diuresis with IV lasix  DVT prophylaxis:  Fully anticoagulated- lovenox   Code Status: Full Code   Family Communication: Called son, no answer 2/21  Disposition Plan:  Home once diuresed    Consultants:   cards     Subjective: Off O2 Nursing to ambulate  Objective: Vitals:   08/29/16 2018 08/29/16 2310 08/30/16 0300 08/30/16 0835  BP:  (!) 149/87 120/84 131/80  Pulse:  73 74 (!) 56  Resp:   (!) 29 (!) 26  Temp: 98.9 F (37.2 C)  98.7 F (37.1 C) 98.1 F (36.7 C)  TempSrc: Oral  Oral Oral  SpO2:   92% 91%  Weight:   48.8 kg (107 lb 8 oz)   Height:        Intake/Output Summary (Last 24 hours) at 08/30/16 1157 Last data filed at 08/30/16 1000  Gross per 24 hour  Intake              839 ml  Output             1700 ml  Net             -861 ml   Filed Weights   08/28/16 0402 08/29/16 0400 08/30/16 0300  Weight: 50.7 kg (111 lb 11.2 oz) 50 kg (110 lb 3.7 oz) 48.8 kg (107 lb 8 oz)    Examination:  General exam: Appears calm and comfortable  Respiratory system: coarse breath sounds that cleared with cough Cardiovascular system: irregular Gastrointestinal system: Abdomen is nondistended, soft and nontender. No organomegaly or masses felt. Normal bowel sounds heard. Central nervous system: Alert and oriented. No focal neurological deficits. Flat affect    Data Reviewed: I have personally reviewed following labs and imaging studies  CBC:  Recent Labs Lab 08/26/16 1101 08/27/16 0210 08/28/16 0529 08/29/16 0325  WBC 5.7 7.0 6.1 6.8  NEUTROABS 3.7  --   --   --   HGB 12.7 10.9* 11.3* 12.3  HCT 39.6 34.5* 35.7* 38.5  MCV 90.6 90.3 91.1 90.0  PLT 355 362  370 [id number]*   Basic Metabolic Panel:  Recent Labs Lab 08/26/16 1101 08/27/16 0210 08/28/16 0529 08/29/16 0325 08/30/16 0426 08/30/16 0836  NA 140 136 134* 134* 134*  --   K 4.2 3.4* 3.8 3.4* 3.5  --   CL 106 102 99* 98* 96*  --   CO2 24 23 25 27 26   --   GLUCOSE 93 127* 101* 118* 110*  --   BUN 16 26* 20 22* 27*  --   CREATININE 1.07* 1.45* 1.13* 1.25* 1.33*  --   CALCIUM 9.0 8.3* 8.5* 8.8* 9.0  --   MG  --   --   --   --   --  1.6*   GFR: Estimated Creatinine Clearance: 31.8 mL/min (by C-G formula based on SCr of 1.33 mg/dL (H)). Liver Function Tests:  Recent Labs Lab 08/26/16 1101 08/27/16 0210  AST 29 25  ALT 20 17  ALKPHOS 104 88  BILITOT 0.4 0.5  PROT 7.8 6.8  ALBUMIN 2.3* 2.0*   No results for input(s): LIPASE, AMYLASE in the last 168 hours. No results for input(s): AMMONIA in the last 168 hours. Coagulation Profile:  Recent Labs Lab 08/27/16 0210  INR 1.14   Cardiac Enzymes:  Recent Labs Lab 08/26/16 1439 08/26/16 1853 08/27/16 0210  TROPONINI <0.03 <0.03 <0.03   BNP (last 3 results) No results for input(s): PROBNP in the last 8760 hours. HbA1C: No results for input(s): HGBA1C in the last 72 hours. CBG: No results for input(s): GLUCAP in the last 168 hours. Lipid Profile: No results for input(s): CHOL, HDL, LDLCALC, TRIG, CHOLHDL, LDLDIRECT in the last 72 hours. Thyroid Function Tests: No results for input(s): TSH, T4TOTAL, FREET4, T3FREE, THYROIDAB in the last 72 hours. Anemia Panel: No results for input(s): VITAMINB12, FOLATE, FERRITIN, TIBC, IRON, RETICCTPCT in the last 72 hours. Urine analysis:    Component Value Date/Time   COLORURINE YELLOW 07/24/2016 2100   APPEARANCEUR CLEAR 07/24/2016 2100   LABSPEC 1.013 07/24/2016 2100   PHURINE 5.0 07/24/2016 2100   GLUCOSEU NEGATIVE 07/24/2016 2100   HGBUR SMALL (A) 07/24/2016 2100   [place] NEGATIVE 07/24/2016 2100   KETONESUR NEGATIVE 07/24/2016 2100   PROTEINUR >=300 (A) 07/24/2016  2100   UROBILINOGEN 0.2 06/25/2016 1507   NITRITE NEGATIVE 07/24/2016 2100   LEUKOCYTESUR NEGATIVE 07/24/2016 2100     )No results found for this or any previous visit (from the past 240 hour(s)).    Anti-infectives    Start     Dose/Rate Route Frequency Ordered Stop   08/29/16 0900  cefTRIAXone (ROCEPHIN) 1 g in dextrose 5 % 50 mL IVPB     1 g 100 mL/hr over  30 Minutes Intravenous Every 24 hours 08/29/16 0803     08/29/16 0900  azithromycin (ZITHROMAX) 500 mg in dextrose 5 % 250 mL IVPB     500 mg 250 mL/hr over 60 Minutes Intravenous Every 24 hours 08/29/16 0803         Radiology Studies: Dg Chest 2 View  Result Date: 08/28/2016 CLINICAL DATA:  One month of chest tightness, left-sided chest pain, and shortness of breath. Recent flu and pneumonia. History of ihypertension, current smoker. EXAM: CHEST  2 VIEW COMPARISON:  Chest X ray and chest CT scan of August 27, 2016. FINDINGS: The lungs are reasonably well inflated. There are bilateral pleural effusions greatest on the right. There is perihilar and infrahilar atelectasis or pneumonia. There is thickening of the minor fissure. The interstitial markings of both lungs are increased in the mid and upper lobes in this is stable. The cardiac silhouette remains enlarged. The pulmonary vascularity is less engorged and more distinct. There is calcification in the wall of the thoracic aorta. The bony thorax exhibits no acute abnormality. IMPRESSION: Bibasilar pneumonia with small bilateral pleural effusions. CHF with mild pulmonary interstitial edema. There has been slight overall improvement in the appearance of the chest since yesterday's study. Thoracic aortic atherosclerosis. Electronically Signed   By: David  Martinique M.D.   On: 08/28/2016 15:03        Scheduled Meds: . azithromycin  500 mg Intravenous Q24H  . busPIRone  10 mg Oral BID  . cefTRIAXone (ROCEPHIN)  IV  1 g Intravenous Q24H  . diltiazem  60 mg Oral Q8H  . enoxaparin  (LOVENOX) injection  50 mg Subcutaneous Q12H  . folic acid  1 mg Oral Daily  . furosemide  80 mg Intravenous BID  . gabapentin  300 mg Oral TID  . multivitamin with minerals  1 tablet Oral Daily  . potassium chloride  40 mEq Oral Daily  . thiamine  100 mg Oral Daily   Continuous Infusions:   LOS: 3 days    Time spent: 25 min    [place], DO [facility] Pager [phone]  If 7PM-7AM, please contact night-coverage [email or web address] Password [facility] 08/30/2016, 11:57 AM

## 2016-08-30 NOTE — Plan of Care (Signed)
Problem: Safety: Goal: Ability to remain free from injury will improve Outcome: Progressing Fall risk bundle in place. Call light within reach and pt demonstrated use. Bed alarm on. Pt asked to call before getting up however, the pt has not been compliant with this and has gotten up several times despite re-direction. Will continue to monitor pt, encourage her and perform hourly rounding.   Problem: Health Behavior/Discharge Planning: Goal: Ability to manage health-related needs will improve Outcome: Progressing While performing hourly rounding I observed the pt going through her purse and holding 5 medication bottles. I notified the charge nurse, Colletta [place], and we went into the pt's room and explained that home medications were not allowed and that they had to be counted and brought to pharmacy for safe keeping. We explained the reasoning and safety concerns. Pt verbalized that she agreed and signed the required form (copy in chart). Pt stated she has not been taking them while in the hospital. Will continue to monitor pt and continue to encourage compliance with medication regime.

## 2016-08-30 NOTE — Progress Notes (Signed)
Progress Note  Patient Name: Victoria Burke Date of Encounter: 08/30/2016  Primary Cardiologist: Dr Marlou Porch  Patient Profile     66 y.o. female w/ hx breast CA s/p radiation in 2013, COPD w/ recent admission for acute exacerbation in the setting of flu and bacterial PNA,GERD, h/o gastric ulcer, cocaine use,HTN and HLD. Admit after cocaine w/ tachycardia>>MAT, EF nl on echo. Admit 02/19 w/ SOB, CHF & COPD, now in atrial fib (recent cocaine use).   Subjective   Breathing much better, Sats 94% RA, still SOB w/ minimal activity  Inpatient Medications    Scheduled Meds: . azithromycin  500 mg Intravenous Q24H  . busPIRone  10 mg Oral BID  . cefTRIAXone (ROCEPHIN)  IV  1 g Intravenous Q24H  . diltiazem  60 mg Oral Q8H  . enoxaparin (LOVENOX) injection  50 mg Subcutaneous Q12H  . folic acid  1 mg Oral Daily  . furosemide  80 mg Intravenous BID  . gabapentin  300 mg Oral TID  . multivitamin with minerals  1 tablet Oral Daily  . thiamine  100 mg Oral Daily   Continuous Infusions:  PRN Meds: acetaminophen **OR** acetaminophen, albuterol, bisacodyl, HYDROcodone-acetaminophen, magnesium citrate, ondansetron **OR** ondansetron (ZOFRAN) IV, senna-docusate   Vital Signs    Vitals:   08/29/16 1656 08/29/16 2018 08/29/16 2310 08/30/16 0300  BP: 130/70  (!) 149/87 120/84  Pulse:   73 74  Resp:    (!) 29  Temp: 98 F (36.7 C) 98.9 F (37.2 C)  98.7 F (37.1 C)  TempSrc: Oral Oral  Oral  SpO2:    92%  Weight:    107 lb 8 oz (48.8 kg)  Height:        Intake/Output Summary (Last 24 hours) at 08/30/16 0824 Last data filed at 08/29/16 1400  Gross per 24 hour  Intake              599 ml  Output              850 ml  Net             -251 ml   Filed Weights   08/28/16 0402 08/29/16 0400 08/30/16 0300  Weight: 111 lb 11.2 oz (50.7 kg) 110 lb 3.7 oz (50 kg) 107 lb 8 oz (48.8 kg)    Telemetry    Mostly SR overnight, ventricular ectopy including bigeminy, atrial ectopy and  atrial fib - Personally Reviewed  ECG    n/a - Personally Reviewed  Physical Exam   General: Well developed, thin, female appearing in no acute distress. Head: Normocephalic, atraumatic.  Neck: Supple without bruits, JVD 10 cm. Lungs:  Resp regular and unlabored, improving bibasilar rales and rhonchi. Heart: Irreg R&R (2nd PVCs), S1, S2, no S3, S4, or murmur; no rub. Abdomen: Soft, non-tender, non-distended with normoactive bowel sounds. No hepatomegaly. No rebound/guarding. No obvious abdominal masses. Extremities: No clubbing, cyanosis, no edema. Distal pedal pulses are 2+ bilaterally. Neuro: Alert and oriented X 3. Moves all extremities spontaneously. Psych: Normal affect.  Labs    Hematology  Recent Labs Lab 08/27/16 0210 08/28/16 0529 08/29/16 0325  WBC 7.0 6.1 6.8  RBC 3.82* 3.92 4.28  HGB 10.9* 11.3* 12.3  HCT 34.5* 35.7* 38.5  MCV 90.3 91.1 90.0  MCH 28.5 28.8 28.7  MCHC 31.6 31.7 31.9  RDW 15.2 15.1 14.4  PLT 362 370 419*    Chemistry  Recent Labs Lab 08/26/16 1101 08/27/16 0210 08/28/16 0529 08/29/16 0325  08/30/16 0426  NA 140 136 134* 134* 134*  K 4.2 3.4* 3.8 3.4* 3.5  CL 106 102 99* 98* 96*  CO2 24 23 25 27 26   GLUCOSE 93 127* 101* 118* 110*  BUN 16 26* 20 22* 27*  CREATININE 1.07* 1.45* 1.13* 1.25* 1.33*  CALCIUM 9.0 8.3* 8.5* 8.8* 9.0  PROT 7.8 6.8  --   --   --   ALBUMIN 2.3* 2.0*  --   --   --   AST 29 25  --   --   --   ALT 20 17  --   --   --   ALKPHOS 104 88  --   --   --   BILITOT 0.4 0.5  --   --   --   GFRNONAA 53* 37* 50* 44* 41*  GFRAA >60 43* 58* 51* 47*  ANIONGAP 10 11 10 9 12      Cardiac Enzymes  Recent Labs Lab 08/26/16 1439 08/26/16 1853 08/27/16 0210  TROPONINI <0.03 <0.03 <0.03     Recent Labs Lab 08/26/16 1112  TROPIPOC 0.02     BNP  Recent Labs Lab 08/26/16 1101  BNP 843.9*     Radiology    Dg Chest 2 View Result Date: 08/28/2016 CLINICAL DATA:  One month of chest tightness, left-sided chest  pain, and shortness of breath. Recent flu and pneumonia. History of ihypertension, current smoker. EXAM: CHEST  2 VIEW COMPARISON:  Chest X ray and chest CT scan of August 27, 2016. FINDINGS: The lungs are reasonably well inflated. There are bilateral pleural effusions greatest on the right. There is perihilar and infrahilar atelectasis or pneumonia. There is thickening of the minor fissure. The interstitial markings of both lungs are increased in the mid and upper lobes in this is stable. The cardiac silhouette remains enlarged. The pulmonary vascularity is less engorged and more distinct. There is calcification in the wall of the thoracic aorta. The bony thorax exhibits no acute abnormality. IMPRESSION: Bibasilar pneumonia with small bilateral pleural effusions. CHF with mild pulmonary interstitial edema. There has been slight overall improvement in the appearance of the chest since yesterday's study. Thoracic aortic atherosclerosis. Electronically Signed   By: David  Martinique M.D.   On: 08/28/2016 15:03   Dg Chest 2 View Result Date: 08/26/2016 CLINICAL DATA:  Short of breath. EXAM: CHEST  2 VIEW COMPARISON:  07/30/2016 FINDINGS: Cardiac enlargement.  Pulmonary vascular congestion. Bibasilar airspace disease with progression since the prior study. Small bilateral pleural effusions. Atherosclerotic aorta with calcification. IMPRESSION: Progression of bilateral airspace disease since the prior study. Progression of small bilateral effusions. This pattern could be seen with diffuse pneumonia versus congestive heart failure and edema. Electronically Signed   By: Franchot Gallo M.D.   On: 08/26/2016 10:44   Ct Chest Wo Contrast Result Date: 08/27/2016 CLINICAL DATA:  66 year old female with history of hypoxia. Shortness of breath. EXAM: CT CHEST WITHOUT CONTRAST TECHNIQUE: Multidetector CT imaging of the chest was performed following the standard protocol without IV contrast. COMPARISON:  Chest CT 09/20/2015.  FINDINGS: Cardiovascular: Heart size is mildly enlarged. Small amount of pericardial fluid and/or thickening. No pericardial calcification. There is aortic atherosclerosis, as well as atherosclerosis of the great vessels of the mediastinum and the coronary arteries, including calcified atherosclerotic plaque in the left anterior descending and right coronary arteries. Calcifications of the aortic valve (mild). Mediastinum/Nodes: Multiple enlarged mediastinal lymph nodes. The largest of these include a 1.9 cm short axis prevascular lymph node (  image 65 of series 3) and a 1.6 cm short axis low right paratracheal lymph node (image 71 of series 3). Esophagus is unremarkable in appearance. Several enlarged right axillary lymph nodes are also noted measuring up to 11 mm in short axis. Surgical clips in the left axilla, presumably from prior lymph node dissection. Lungs/Pleura: Moderate to large right and small left pleural effusions lying dependently. There are extensive dependent opacities in the lower lobes of the lungs bilaterally which are predominantly related to passive subsegmental atelectasis, although underlying airspace consolidation is also suspected (poorly evaluated on today's noncontrast CT, particularly in the right lower lobe. Examination) Mild diffuse bronchial wall thickening with mild centrilobular and paraseptal emphysema. Mild diffuse ground-glass attenuation with some interlobular septal thickening, suggestive of a background of mild interstitial pulmonary edema. Upper Abdomen: Left adrenal gland is incompletely visualize, but appears diffusely enlarged and low-attenuation, indicative of adenomatous hyperplasia. Aortic atherosclerosis. Musculoskeletal: There are no aggressive appearing lytic or blastic lesions noted in the visualized portions of the skeleton. IMPRESSION: 1. The appearance of the chest suggests underlying congestive heart failure. This is based on the presence of cardiomegaly,  bilateral pleural effusions and what appears to be pulmonary edema. 2. In addition, there are areas of atelectasis in the lung bases bilaterally. The possibility of infectious airspace consolidation, particularly in the right lower lobe, should also be considered if the patient is exhibiting signs are symptoms of pneumonia. 3. Mediastinal and right axillary lymphadenopathy slightly increased compared to prior study 09/20/2015. Given the chronicity of this, this is favored to be benign, however, clinical correlation for signs and symptoms of lymphoproliferative disorder is suggested. 4. Aortic atherosclerosis, in addition to 2 vessel coronary artery disease. Please note that although the presence of coronary artery calcium documents the presence of coronary artery disease, the severity of this disease and any potential stenosis cannot be assessed on this non-gated CT examination. Assessment for potential risk factor modification, dietary therapy or pharmacologic therapy may be warranted, if clinically indicated. 5. Additional incidental findings, as above. Electronically Signed   By: Vinnie Langton M.D.   On: 08/27/2016 13:17   Dg Chest Port 1 View Result Date: 08/27/2016 CLINICAL DATA:  Exacerbation of CHF, shortness of breath EXAM: PORTABLE CHEST 1 VIEW COMPARISON:  Chest x-ray of 08/26/2016 FINDINGS: The lungs are not well aerated. There are bibasilar opacities with probable small effusions and cardiomegaly. This pattern is most consistent with congestive heart failure although pneumonia involving the lung bases cannot be excluded and followup is recommended. Moderate thoracic aortic atherosclerosis is noted. No bony abnormality is seen. IMPRESSION: 1. Diminished aeration with probable increasing CHF and effusions. Cannot exclude basilar pneumonia. 2. Moderate thoracic aortic atherosclerosis. Electronically Signed   By: Ivar Drape M.D.   On: 08/27/2016 08:58     Cardiac Studies   None since  echo  Patient Profile     66 y.o. female w/ hx breast CA s/p radiation in 2013, COPD w/ recent admission for acute exacerbation in the setting of flu and bacterial PNA,GERD, h/o gastric ulcer, cocaine use,HTN and HLD. Admit after cocaine w/ tachycardia>>MAT, EF nl on echo. Admit 02/19 w/ SOB, CHF & COPD, now in atrial fib (recent cocaine use).   Assessment & Plan     1  Acute on chronic diastolic CHF    Echo in Jan 2018 LVEF 65 to 70%, I/O neg 2.0 L since admit but record is incomplete. Wt down to 107 lb. Got Lasix 80 mg IV 01/21  Vol still appers increased. Though she is ablve to lie flaContinue IV Lasix this am, may be able to change to po in am.   2   Rhythm  Current with SR with PVCs(frequent)  Rates OK; with frequent PVCs, will ck Mg and supp K+  3  Chest pain  PNA and CHF on CXR,  Pleuriitc pain, Painfult with palpitation of chest No further eval planned.  Otherwise, per IM Principal Problem:   Respiratory failure, acute-on-chronic (HCC) Active Problems:   Chronic obstructive pulmonary disease (HCC)   Acute on chronic congestive heart failure (HCC)   Atrial fibrillation with rapid ventricular response (HCC)   Breast cancer of upper-outer quadrant of left female breast (HCC)   Gout   Depression   Tobacco abuse   Cocaine abuse   Essential hypertension   Generalized anxiety disorder   CHF exacerbation (HCC)   Acute on chronic combined systolic and diastolic CHF (congestive heart failure) ([place])    Signed, Rosaria Ferries , PA-C 8:24 AM 08/30/2016 Pager: [phone]  PT seen and exmained  I agree with findings as noted above by R Barrett Pt sleeping comfortably  Lungs with rales at bases.  Cardiac exam:  RRR with skips  Ext wthout edema   WOuld switch to PO lasix in AM   Prob d/c tomorrow with close f/u as outpt.  She has not been seen in cardiology before  WIll make sure this is arranged.   Dorris Carnes

## 2016-08-31 DIAGNOSIS — C50412 Malignant neoplasm of upper-outer quadrant of left female breast: Secondary | ICD-10-CM

## 2016-08-31 LAB — CBC
HEMATOCRIT: 38.6 % (ref 36.0–46.0)
Hemoglobin: 12.5 g/dL (ref 12.0–15.0)
MCH: 28.9 pg (ref 26.0–34.0)
MCHC: 32.4 g/dL (ref 30.0–36.0)
MCV: 89.1 fL (ref 78.0–100.0)
Platelets: 417 10*3/uL — ABNORMAL HIGH (ref 150–400)
RBC: 4.33 MIL/uL (ref 3.87–5.11)
RDW: 14.1 % (ref 11.5–15.5)
WBC: 6.1 10*3/uL (ref 4.0–10.5)

## 2016-08-31 LAB — COMPREHENSIVE METABOLIC PANEL
ALT: 93 U/L — ABNORMAL HIGH (ref 14–54)
AST: 158 U/L — AB (ref 15–41)
Albumin: 2.1 g/dL — ABNORMAL LOW (ref 3.5–5.0)
Alkaline Phosphatase: 89 U/L (ref 38–126)
Anion gap: 11 (ref 5–15)
BUN: 30 mg/dL — AB (ref 6–20)
CHLORIDE: 100 mmol/L — AB (ref 101–111)
CO2: 24 mmol/L (ref 22–32)
CREATININE: 1.29 mg/dL — AB (ref 0.44–1.00)
Calcium: 9.2 mg/dL (ref 8.9–10.3)
GFR calc Af Amer: 49 mL/min — ABNORMAL LOW (ref >60)
GFR, EST NON AFRICAN AMERICAN: 42 mL/min — AB (ref >60)
Glucose, Bld: 96 mg/dL (ref 65–99)
POTASSIUM: 4.2 mmol/L (ref 3.5–5.1)
SODIUM: 135 mmol/L (ref 135–145)
Total Bilirubin: 0.2 mg/dL — ABNORMAL LOW (ref 0.3–1.2)
Total Protein: 7.8 g/dL (ref 6.5–8.1)

## 2016-08-31 MED ORDER — FUROSEMIDE 40 MG PO TABS
40.0000 mg | ORAL_TABLET | Freq: Every day | ORAL | Status: DC
  Administered 2016-08-31: 40 mg via ORAL
  Filled 2016-08-31: qty 1

## 2016-08-31 MED ORDER — DILTIAZEM HCL ER COATED BEADS 180 MG PO CP24
180.0000 mg | ORAL_CAPSULE | Freq: Every day | ORAL | Status: DC
  Administered 2016-08-31: 180 mg via ORAL
  Filled 2016-08-31: qty 1

## 2016-08-31 MED ORDER — DILTIAZEM HCL ER COATED BEADS 180 MG PO CP24: 180.0000 mg | ORAL_CAPSULE | Freq: Every day | ORAL | 1 refills | Status: DC

## 2016-08-31 MED ORDER — POTASSIUM CHLORIDE CRYS ER 20 MEQ PO TBCR
20.0000 meq | EXTENDED_RELEASE_TABLET | Freq: Every day | ORAL | Status: DC
  Administered 2016-08-31: 20 meq via ORAL
  Filled 2016-08-31: qty 1

## 2016-08-31 MED ORDER — FUROSEMIDE 40 MG PO TABS: 40.0000 mg | ORAL_TABLET | Freq: Every day | ORAL | 1 refills | Status: DC

## 2016-08-31 MED ORDER — POTASSIUM CHLORIDE CRYS ER 20 MEQ PO TBCR: 20.0000 meq | EXTENDED_RELEASE_TABLET | Freq: Every day | ORAL | 1 refills | Status: DC

## 2016-08-31 MED ORDER — ASPIRIN 81 MG PO TBEC: 81.0000 mg | DELAYED_RELEASE_TABLET | Freq: Every day | ORAL | 1 refills | Status: AC

## 2016-08-31 MED ORDER — ASPIRIN EC 81 MG PO TBEC
81.0000 mg | DELAYED_RELEASE_TABLET | Freq: Every day | ORAL | Status: DC
  Administered 2016-08-31: 81 mg via ORAL
  Filled 2016-08-31: qty 1

## 2016-08-31 NOTE — Progress Notes (Signed)
Progress Note  Patient Name: Victoria Burke Date of Encounter: 08/31/2016  Primary Cardiologist: Dr Marlou Porch  Patient Profile     66 y.o. female w/ hx breast CA s/p radiation in 2013, COPD w/ recent admission for acute exacerbation in the setting of flu and bacterial PNA,GERD, h/o gastric ulcer, cocaine use,HTN and HLD. Admit after cocaine w/ tachycardia>>MAT, EF nl on echo. Admit 02/19 w/ SOB, CHF & COPD, now in atrial fib (recent cocaine use).   Subjective   Denies dyspnea or chest pain  Inpatient Medications    Scheduled Meds: . azithromycin  500 mg Oral Daily  . busPIRone  10 mg Oral BID  . cefTRIAXone (ROCEPHIN)  IV  1 g Intravenous Q24H  . diltiazem  60 mg Oral Q8H  . enoxaparin (LOVENOX) injection  50 mg Subcutaneous Q12H  . folic acid  1 mg Oral Daily  . furosemide  60 mg Oral Daily  . gabapentin  300 mg Oral TID  . multivitamin with minerals  1 tablet Oral Daily  . potassium chloride  40 mEq Oral Daily  . thiamine  100 mg Oral Daily   Continuous Infusions:  PRN Meds: acetaminophen **OR** acetaminophen, albuterol, bisacodyl, HYDROcodone-acetaminophen, magnesium citrate, ondansetron **OR** ondansetron (ZOFRAN) IV, senna-docusate   Vital Signs    Vitals:   08/30/16 2146 08/31/16 0012 08/31/16 0438 08/31/16 0736  BP: (!) 137/91 136/78 (!) 138/97 131/81  Pulse: 73 64 73 72  Resp: 17 (!) 23 (!) 23 (!) 22  Temp: 97.9 F (36.6 C) 98 F (36.7 C) 98 F (36.7 C) 97.9 F (36.6 C)  TempSrc:    Oral  SpO2: 94% 92% 98% 95%  Weight:   108 lb 14.4 oz (49.4 kg)   Height:        Intake/Output Summary (Last 24 hours) at 08/31/16 0809 Last data filed at 08/31/16 0300  Gross per 24 hour  Intake             1260 ml  Output              850 ml  Net              410 ml   Filed Weights   08/29/16 0400 08/30/16 0300 08/31/16 0438  Weight: 110 lb 3.7 oz (50 kg) 107 lb 8 oz (48.8 kg) 108 lb 14.4 oz (49.4 kg)    Telemetry    Sinus with PVCs - Personally  Reviewed  Physical Exam   General: Well developed, thin, female appearing in no acute distress. Head: Normal Neck: Supple Lungs:  CTA Heart: RRR Abdomen: Soft, non-tender, non-distended Extremities: No edema Neuro: Grossly intact   Labs    Hematology  Recent Labs Lab 08/28/16 0529 08/29/16 0325 08/31/16 0305  WBC 6.1 6.8 6.1  RBC 3.92 4.28 4.33  HGB 11.3* 12.3 12.5  HCT 35.7* 38.5 38.6  MCV 91.1 90.0 89.1  MCH 28.8 28.7 28.9  MCHC 31.7 31.9 32.4  RDW 15.1 14.4 14.1  PLT 370 419* 417*    Chemistry  Recent Labs Lab 08/26/16 1101 08/27/16 0210  08/29/16 0325 08/30/16 0426 08/31/16 0305  NA 140 136  < > 134* 134* 135  K 4.2 3.4*  < > 3.4* 3.5 4.2  CL 106 102  < > 98* 96* 100*  CO2 24 23  < > 27 26 24   GLUCOSE 93 127*  < > 118* 110* 96  BUN 16 26*  < > 22* 27* 30*  CREATININE 1.07*  1.45*  < > 1.25* 1.33* 1.29*  CALCIUM 9.0 8.3*  < > 8.8* 9.0 9.2  PROT 7.8 6.8  --   --   --  7.8  ALBUMIN 2.3* 2.0*  --   --   --  2.1*  AST 29 25  --   --   --  158*  ALT 20 17  --   --   --  93*  ALKPHOS 104 88  --   --   --  89  BILITOT 0.4 0.5  --   --   --  0.2*  GFRNONAA 53* 37*  < > 44* 41* 42*  GFRAA >60 43*  < > 51* 47* 49*  ANIONGAP 10 11  < > 9 12 11   < > = values in this interval not displayed.   Cardiac Enzymes  Recent Labs Lab 08/26/16 1439 08/26/16 1853 08/27/16 0210  TROPONINI <0.03 <0.03 <0.03     Recent Labs Lab 08/26/16 1112  TROPIPOC 0.02     BNP  Recent Labs Lab 08/26/16 1101  BNP 843.9*     Radiology    Dg Chest 2 View Result Date: 08/28/2016 CLINICAL DATA:  One month of chest tightness, left-sided chest pain, and shortness of breath. Recent flu and pneumonia. History of ihypertension, current smoker. EXAM: CHEST  2 VIEW COMPARISON:  Chest X ray and chest CT scan of August 27, 2016. FINDINGS: The lungs are reasonably well inflated. There are bilateral pleural effusions greatest on the right. There is perihilar and infrahilar  atelectasis or pneumonia. There is thickening of the minor fissure. The interstitial markings of both lungs are increased in the mid and upper lobes in this is stable. The cardiac silhouette remains enlarged. The pulmonary vascularity is less engorged and more distinct. There is calcification in the wall of the thoracic aorta. The bony thorax exhibits no acute abnormality. IMPRESSION: Bibasilar pneumonia with small bilateral pleural effusions. CHF with mild pulmonary interstitial edema. There has been slight overall improvement in the appearance of the chest since yesterday's study. Thoracic aortic atherosclerosis. Electronically Signed   By: David  Martinique M.D.   On: 08/28/2016 15:03   Dg Chest 2 View Result Date: 08/26/2016 CLINICAL DATA:  Short of breath. EXAM: CHEST  2 VIEW COMPARISON:  07/30/2016 FINDINGS: Cardiac enlargement.  Pulmonary vascular congestion. Bibasilar airspace disease with progression since the prior study. Small bilateral pleural effusions. Atherosclerotic aorta with calcification. IMPRESSION: Progression of bilateral airspace disease since the prior study. Progression of small bilateral effusions. This pattern could be seen with diffuse pneumonia versus congestive heart failure and edema. Electronically Signed   By: Franchot Gallo M.D.   On: 08/26/2016 10:44   Ct Chest Wo Contrast Result Date: 08/27/2016 CLINICAL DATA:  66 year old female with history of hypoxia. Shortness of breath. EXAM: CT CHEST WITHOUT CONTRAST TECHNIQUE: Multidetector CT imaging of the chest was performed following the standard protocol without IV contrast. COMPARISON:  Chest CT 09/20/2015. FINDINGS: Cardiovascular: Heart size is mildly enlarged. Small amount of pericardial fluid and/or thickening. No pericardial calcification. There is aortic atherosclerosis, as well as atherosclerosis of the great vessels of the mediastinum and the coronary arteries, including calcified atherosclerotic plaque in the left anterior  descending and right coronary arteries. Calcifications of the aortic valve (mild). Mediastinum/Nodes: Multiple enlarged mediastinal lymph nodes. The largest of these include a 1.9 cm short axis prevascular lymph node (image 65 of series 3) and a 1.6 cm short axis low right paratracheal lymph node (image 71  of series 3). Esophagus is unremarkable in appearance. Several enlarged right axillary lymph nodes are also noted measuring up to 11 mm in short axis. Surgical clips in the left axilla, presumably from prior lymph node dissection. Lungs/Pleura: Moderate to large right and small left pleural effusions lying dependently. There are extensive dependent opacities in the lower lobes of the lungs bilaterally which are predominantly related to passive subsegmental atelectasis, although underlying airspace consolidation is also suspected (poorly evaluated on today's noncontrast CT, particularly in the right lower lobe. Examination) Mild diffuse bronchial wall thickening with mild centrilobular and paraseptal emphysema. Mild diffuse ground-glass attenuation with some interlobular septal thickening, suggestive of a background of mild interstitial pulmonary edema. Upper Abdomen: Left adrenal gland is incompletely visualize, but appears diffusely enlarged and low-attenuation, indicative of adenomatous hyperplasia. Aortic atherosclerosis. Musculoskeletal: There are no aggressive appearing lytic or blastic lesions noted in the visualized portions of the skeleton. IMPRESSION: 1. The appearance of the chest suggests underlying congestive heart failure. This is based on the presence of cardiomegaly, bilateral pleural effusions and what appears to be pulmonary edema. 2. In addition, there are areas of atelectasis in the lung bases bilaterally. The possibility of infectious airspace consolidation, particularly in the right lower lobe, should also be considered if the patient is exhibiting signs are symptoms of pneumonia. 3.  Mediastinal and right axillary lymphadenopathy slightly increased compared to prior study 09/20/2015. Given the chronicity of this, this is favored to be benign, however, clinical correlation for signs and symptoms of lymphoproliferative disorder is suggested. 4. Aortic atherosclerosis, in addition to 2 vessel coronary artery disease. Please note that although the presence of coronary artery calcium documents the presence of coronary artery disease, the severity of this disease and any potential stenosis cannot be assessed on this non-gated CT examination. Assessment for potential risk factor modification, dietary therapy or pharmacologic therapy may be warranted, if clinically indicated. 5. Additional incidental findings, as above. Electronically Signed   By: Vinnie Langton M.D.   On: 08/27/2016 13:17   Dg Chest Port 1 View Result Date: 08/27/2016 CLINICAL DATA:  Exacerbation of CHF, shortness of breath EXAM: PORTABLE CHEST 1 VIEW COMPARISON:  Chest x-ray of 08/26/2016 FINDINGS: The lungs are not well aerated. There are bibasilar opacities with probable small effusions and cardiomegaly. This pattern is most consistent with congestive heart failure although pneumonia involving the lung bases cannot be excluded and followup is recommended. Moderate thoracic aortic atherosclerosis is noted. No bony abnormality is seen. IMPRESSION: 1. Diminished aeration with probable increasing CHF and effusions. Cannot exclude basilar pneumonia. 2. Moderate thoracic aortic atherosclerosis. Electronically Signed   By: Ivar Drape M.D.   On: 08/27/2016 08:58     Patient Profile     66 y.o. female w/ hx breast CA s/p radiation in 2013, COPD w/ recent admission for acute exacerbation in the setting of flu and bacterial PNA,GERD, h/o gastric ulcer, cocaine use,HTN and HLD. Admit after cocaine w/ tachycardia>>MAT, EF nl on echo. Admit 02/19 w/ SOB, CHF & COPD, in atrial fib (recent cocaine use); converted to sinus.    Assessment & Plan     1  Acute on chronic diastolic CHF    Echo in Jan 2018 LVEF 65 to 70%. Volume status improved; change lasix to 40 mg daily and Kcl to 20 meq daily. Will need BMET one week after DC.  2  Paroxysmal atrial fibrillation-Patient remains in sinus rhythm and converted spontaneously. Change Cardizem to 180 mg daily. Given substance abuse she is  not a candidate for anticoagulation long-term. Will add aspirin.   3  Chest pain -enzymes negative; no plans for further ischemia eval.  4  Substance abuse-pt counseled on [place].   Cardiology will sign off. Pt should fu wit Dr Marlou Porch following DC; please call with questions.  Signed, Kirk Ruths , MD 8:09 AM 08/31/2016

## 2016-08-31 NOTE — Discharge Summary (Signed)
Physician Discharge Summary  Victoria Burke [id number] DOB: Dec 06, 1950 DOA: 08/26/2016  PCP: Dorena Dew, FNP  Admit date: 08/26/2016 Discharge date: 08/31/2016  Admitted From: Home Disposition: Home  Recommendations for Outpatient Follow-up:  1. Follow up with PCP in 1-2 weeks 2. Follow-up with cardiology as scheduled  Home Health: none Equipment/Devices: none  Discharge Condition: Stable CODE STATUS: Full code Diet recommendation: heart healthy  HPI: Per Victoria Burke, Victoria Burke is a 66 y.o. female with medical history significant for Afib not on anticoagulation, CHF, cocaine abuse among other med issues listed below, and multiple hospitalization since Jan 2018 (CHF, PNA, Fluid overload)  over the last presenting with progressive shortness of breath since last night following last consumption of cocaine on Sunday. On presentation, she was to keep naked, using accessory muscle [place], and significant witnesses and rales were noted.Denies rhinorrhea or hemoptysis. Denies fevers, chills, night sweats or mucositis  Denies any chest pain, chest wall pain or palpitations.Denies any sick contacts or recent long distance travel. Denies any abdominal pain. Has decreased appetite due to current symptoms. denies nausea or vomiting. Denies dizziness or vertigo. No lower extremity swelling. No confusion was reported. Denies any vision changes, double vision or headaches. She continues to smoke one pack a day of cigarettes. She also partakes marijuana. She reports moderate alcohol intake 12 pack a weekend. She lives alone.  Hospital Course: Discharge Diagnoses:  Principal Problem:   Respiratory failure, acute-on-chronic ([employer]) Active Problems:   Breast cancer of upper-outer quadrant of left female breast ([place])   Gout   Depression   Tobacco abuse   Cocaine abuse   Chronic obstructive pulmonary disease (HCC)   Essential hypertension   Generalized anxiety disorder   Acute on  chronic congestive heart failure (HCC)   Atrial fibrillation with rapid ventricular response (HCC)   CHF exacerbation (HCC)   Acute on chronic combined systolic and diastolic CHF (congestive heart failure) (HCC)   Acute hypoxic respiratory failure likely secondary toacute on chronic diastolic CHFexacerbation and Afib with RVR in the setting of cocaine abuse,last consumed 2 days prior to admission. CT Scan shows volume overload, cardiology was consulted to follow patient while hospitalized.  She was diuresed with IV Lasix with improvement in her fluid status, her weight is improved from 114 on admission 108, she was on room air and breathing comfortably.  Atrial Fibrillation -converted to sinus rhythm, continue diltiazem per cardiology, she is not a candidate for long-term anticoagulation due to noncompliance and ongoing polysubstance abuse, place patient on aspirin discharge. Score greater than 2. Hyperlipidemia - Continue home statins COPD with no signs of exacerbation - Continue home meds, continues to smoke- cessation offered Breast Cancer - Follow with Dr. Burr Medico as outpatient GERD,no acute symptoms Anemia of chronic disease, no acute issues noted Tobacco and cocaine, moderate alcohol abuse -Nicotine patch offered, patient declines, sounseled cessation for polysubstance.  Has slight elevation of her LFTs, likely due to cocaine, will need to be monitored as an outpatient in a week when she will have repeat blood work. Anxiety and Depression - Continue Xanax for anxiety and Busparfor depression  Hypokalemia -on home supplementation while on Lasix AKI -Cr stable   Discharge Instructions  Allergies as of 08/31/2016   No Known Allergies     Medication List    STOP taking these medications   cefpodoxime 100 MG tablet Commonly known as:  VANTIN   lisinopril-hydrochlorothiazide 20-12.5 MG tablet Commonly known as:  PRINZIDE,ZESTORETIC     TAKE  these medications   albuterol 108  (90 Base) MCG/ACT inhaler Commonly known as:  PROVENTIL HFA;VENTOLIN HFA Inhale 1-2 puffs into the lungs every 6 (six) hours as needed for wheezing or shortness of breath.   anastrozole 1 MG tablet Commonly known as:  ARIMIDEX TAKE 1 TABLET BY MOUTH EVERY DAY   aspirin 81 MG EC tablet Take 1 tablet (81 mg total) by mouth daily. Start taking on:  09/01/2016   busPIRone 10 MG tablet Commonly known as:  BUSPAR Take 1 tablet (10 mg total) by mouth 2 (two) times daily.   diltiazem 180 MG 24 hr capsule Commonly known as:  CARDIZEM CD Take 1 capsule (180 mg total) by mouth daily.   ergocalciferol 50000 units capsule Commonly known as:  DRISDOL Take 1 capsule (50,000 Units total) by mouth once a week.   furosemide 40 MG tablet Commonly known as:  LASIX Take 1 tablet (40 mg total) by mouth daily. Start taking on:  09/01/2016 What changed:  medication strength  how much to take  additional instructions   gabapentin 300 MG capsule Commonly known as:  NEURONTIN Take 1 capsule (300 mg total) by mouth 3 (three) times daily.   hydrALAZINE 10 MG tablet Commonly known as:  APRESOLINE Take 1 tablet (10 mg total) by mouth 3 (three) times daily.   nicotine 14 mg/24hr patch Commonly known as:  NICODERM CQ Place 1 patch (14 mg total) onto the skin daily.   potassium chloride SA 20 MEQ tablet Commonly known as:  K-DUR,KLOR-CON Take 1 tablet (20 mEq total) by mouth daily. Start taking on:  09/01/2016   saccharomyces boulardii 250 MG capsule Commonly known as:  FLORASTOR Take 1 capsule (250 mg total) by mouth 2 (two) times daily.      Follow-up Information    Candee Furbish, MD. Schedule an appointment as soon as possible for a visit in 2 week(s).   Specialty:  Cardiology Contact information: [id number] [place] [address] [phone]        Dorena Dew, FNP. Schedule an appointment as soon as possible for a visit in 1 week(s).   Specialty:  Family  Medicine Contact information: [address] [place] [address] [phone]          No Known Allergies  Consultations:  Cardiology   Procedures/Studies:  2D echo Study Conclusions - Left ventricle: The cavity size was normal. There was moderate concentric hypertrophy. Systolic function was vigorous. The estimated ejection fraction was in the range of 65% to 70%. Wall motion was normal; there were no regional wall motion abnormalities. The study was not technically sufficient to allow evaluation of LV diastolic dysfunction due to atrial fibrillation. - Aortic valve: Trileaflet; mildly thickened, mildly calcified leaflets. There was no regurgitation. - Mitral valve: Structurally normal valve. There was mild regurgitation. - Left atrium: The atrium was moderately dilated. - Right ventricle: The cavity size was normal. Wall thickness was normal. Systolic function was normal. - Right atrium: The atrium was mildly dilated. - Tricuspid valve: There was mild regurgitation. - Pulmonic valve: There was no regurgitation. - Pulmonary arteries: Systolic pressure was mildly increased. PA peak pressure: 39 mm Hg (S). - Inferior vena cava: The vessel was normal in size.  Dg Chest 2 View  Result Date: 08/28/2016 CLINICAL DATA:  One month of chest tightness, left-sided chest pain, and shortness of breath. Recent flu and pneumonia. History of ihypertension, current smoker. EXAM: CHEST  2 VIEW COMPARISON:  Chest X ray and chest CT scan of August 27, 2016. FINDINGS: The lungs are reasonably well inflated. There are bilateral pleural effusions greatest on the right. There is perihilar and infrahilar atelectasis or pneumonia. There is thickening of the minor fissure. The interstitial markings of both lungs are increased in the mid and upper lobes in this is stable. The cardiac silhouette remains enlarged. The pulmonary vascularity is less engorged and more distinct. There is calcification  in the wall of the thoracic aorta. The bony thorax exhibits no acute abnormality. IMPRESSION: Bibasilar pneumonia with small bilateral pleural effusions. CHF with mild pulmonary interstitial edema. There has been slight overall improvement in the appearance of the chest since yesterday's study. Thoracic aortic atherosclerosis. Electronically Signed   By: David  Martinique M.D.   On: 08/28/2016 15:03   Dg Chest 2 View  Result Date: 08/26/2016 CLINICAL DATA:  Short of breath. EXAM: CHEST  2 VIEW COMPARISON:  07/30/2016 FINDINGS: Cardiac enlargement.  Pulmonary vascular congestion. Bibasilar airspace disease with progression since the prior study. Small bilateral pleural effusions. Atherosclerotic aorta with calcification. IMPRESSION: Progression of bilateral airspace disease since the prior study. Progression of small bilateral effusions. This pattern could be seen with diffuse pneumonia versus congestive heart failure and edema. Electronically Signed   By: Franchot Gallo M.D.   On: 08/26/2016 10:44   Ct Chest Wo Contrast  Result Date: 08/27/2016 CLINICAL DATA:  66 year old female with history of hypoxia. Shortness of breath. EXAM: CT CHEST WITHOUT CONTRAST TECHNIQUE: Multidetector CT imaging of the chest was performed following the standard protocol without IV contrast. COMPARISON:  Chest CT 09/20/2015. FINDINGS: Cardiovascular: Heart size is mildly enlarged. Small amount of pericardial fluid and/or thickening. No pericardial calcification. There is aortic atherosclerosis, as well as atherosclerosis of the great vessels of the mediastinum and the coronary arteries, including calcified atherosclerotic plaque in the left anterior descending and right coronary arteries. Calcifications of the aortic valve (mild). Mediastinum/Nodes: Multiple enlarged mediastinal lymph nodes. The largest of these include a 1.9 cm short axis prevascular lymph node (image 65 of series 3) and a 1.6 cm short axis low right paratracheal  lymph node (image 71 of series 3). Esophagus is unremarkable in appearance. Several enlarged right axillary lymph nodes are also noted measuring up to 11 mm in short axis. Surgical clips in the left axilla, presumably from prior lymph node dissection. Lungs/Pleura: Moderate to large right and small left pleural effusions lying dependently. There are extensive dependent opacities in the lower lobes of the lungs bilaterally which are predominantly related to passive subsegmental atelectasis, although underlying airspace consolidation is also suspected (poorly evaluated on today's noncontrast CT, particularly in the right lower lobe. Examination) Mild diffuse bronchial wall thickening with mild centrilobular and paraseptal emphysema. Mild diffuse ground-glass attenuation with some interlobular septal thickening, suggestive of a background of mild interstitial pulmonary edema. Upper Abdomen: Left adrenal gland is incompletely visualize, but appears diffusely enlarged and low-attenuation, indicative of adenomatous hyperplasia. Aortic atherosclerosis. Musculoskeletal: There are no aggressive appearing lytic or blastic lesions noted in the visualized portions of the skeleton. IMPRESSION: 1. The appearance of the chest suggests underlying congestive heart failure. This is based on the presence of cardiomegaly, bilateral pleural effusions and what appears to be pulmonary edema. 2. In addition, there are areas of atelectasis in the lung bases bilaterally. The possibility of infectious airspace consolidation, particularly in the right lower lobe, should also be considered if the patient is exhibiting signs are symptoms of pneumonia. 3. Mediastinal and right  axillary lymphadenopathy slightly increased compared to prior study 09/20/2015. Given the chronicity of this, this is favored to be benign, however, clinical correlation for signs and symptoms of lymphoproliferative disorder is suggested. 4. Aortic atherosclerosis, in  addition to 2 vessel coronary artery disease. Please note that although the presence of coronary artery calcium documents the presence of coronary artery disease, the severity of this disease and any potential stenosis cannot be assessed on this non-gated CT examination. Assessment for potential risk factor modification, dietary therapy or pharmacologic therapy may be warranted, if clinically indicated. 5. Additional incidental findings, as above. Electronically Signed   By: Vinnie Langton M.D.   On: 08/27/2016 13:17   Dg Chest Port 1 View  Result Date: 08/27/2016 CLINICAL DATA:  Exacerbation of CHF, shortness of breath EXAM: PORTABLE CHEST 1 VIEW COMPARISON:  Chest x-ray of 08/26/2016 FINDINGS: The lungs are not well aerated. There are bibasilar opacities with probable small effusions and cardiomegaly. This pattern is most consistent with congestive heart failure although pneumonia involving the lung bases cannot be excluded and followup is recommended. Moderate thoracic aortic atherosclerosis is noted. No bony abnormality is seen. IMPRESSION: 1. Diminished aeration with probable increasing CHF and effusions. Cannot exclude basilar pneumonia. 2. Moderate thoracic aortic atherosclerosis. Electronically Signed   By: Ivar Drape M.D.   On: 08/27/2016 08:58     Subjective: - no chest pain, shortness of breath, no abdominal pain, nausea or vomiting.   Discharge Exam: Vitals:   08/31/16 0438 08/31/16 0736  BP: (!) 138/97 131/81  Pulse: 73 72  Resp: (!) 23 (!) 22  Temp: 98 F (36.7 C) 97.9 F (36.6 C)   Vitals:   08/30/16 2146 08/31/16 0012 08/31/16 0438 08/31/16 0736  BP: (!) 137/91 136/78 (!) 138/97 131/81  Pulse: 73 64 73 72  Resp: 17 (!) 23 (!) 23 (!) 22  Temp: 97.9 F (36.6 C) 98 F (36.7 C) 98 F (36.7 C) 97.9 F (36.6 C)  TempSrc:    Oral  SpO2: 94% 92% 98% 95%  Weight:   49.4 kg (108 lb 14.4 oz)   Height:        General: Pt is alert, awake, not in acute  distress Cardiovascular: RRR, S1/S2 +, no rubs, no gallops Respiratory: CTA bilaterally, no wheezing, no rhonchi Abdominal: Soft, NT, ND, bowel sounds + Extremities: no edema, no cyanosis    The results of significant diagnostics from this hospitalization (including imaging, microbiology, ancillary and laboratory) are listed below for reference.     Microbiology: No results found for this or any previous visit (from the past 240 hour(s)).   Labs: BNP (last 3 results)  Recent Labs  07/24/16 2008 07/28/16 2038 08/26/16 1101  BNP 523.5* 1,460.6* 123456*   Basic Metabolic Panel:  Recent Labs Lab 08/27/16 0210 08/28/16 0529 08/29/16 0325 08/30/16 0426 08/30/16 0836 08/31/16 0305  NA 136 134* 134* 134*  --  135  K 3.4* 3.8 3.4* 3.5  --  4.2  CL 102 99* 98* 96*  --  100*  CO2 23 25 27 26   --  24  GLUCOSE 127* 101* 118* 110*  --  96  BUN 26* 20 22* 27*  --  30*  CREATININE 1.45* 1.13* 1.25* 1.33*  --  1.29*  CALCIUM 8.3* 8.5* 8.8* 9.0  --  9.2  MG  --   --   --   --  1.6*  --    Liver Function Tests:  Recent Labs Lab 08/26/16 1101 08/27/16  0210 08/31/16 0305  AST 29 25 158*  ALT 20 17 93*  ALKPHOS 104 88 89  BILITOT 0.4 0.5 0.2*  PROT 7.8 6.8 7.8  ALBUMIN 2.3* 2.0* 2.1*   CBC:  Recent Labs Lab 08/26/16 1101 08/27/16 0210 08/28/16 0529 08/29/16 0325 08/31/16 0305  WBC 5.7 7.0 6.1 6.8 6.1  NEUTROABS 3.7  --   --   --   --   HGB 12.7 10.9* 11.3* 12.3 12.5  HCT 39.6 34.5* 35.7* 38.5 38.6  MCV 90.6 90.3 91.1 90.0 89.1  PLT 355 362 370 419* 417*   Cardiac Enzymes:  Recent Labs Lab 08/26/16 1439 08/26/16 1853 08/27/16 0210  TROPONINI <0.03 <0.03 <0.03   Urinalysis    Component Value Date/Time   COLORURINE YELLOW 07/24/2016 2100   APPEARANCEUR CLEAR 07/24/2016 2100   LABSPEC 1.013 07/24/2016 2100   PHURINE 5.0 07/24/2016 2100   GLUCOSEU NEGATIVE 07/24/2016 2100   HGBUR SMALL (A) 07/24/2016 2100   BILIRUBINUR NEGATIVE 07/24/2016 2100    KETONESUR NEGATIVE 07/24/2016 2100   PROTEINUR >=300 (A) 07/24/2016 2100   UROBILINOGEN 0.2 06/25/2016 1507   NITRITE NEGATIVE 07/24/2016 2100   LEUKOCYTESUR NEGATIVE 07/24/2016 2100    Time coordinating discharge: 35 minutes  SIGNED:  Marzetta Board, MD  [facility] 08/31/2016, 1:26 PM Pager [phone]  If 7PM-7AM, please contact night-coverage [email or web address] Password [email or web address]

## 2016-08-31 NOTE — Discharge Instructions (Signed)
Follow with Hollis, Lachina M, FNP in 5-7 days ° °Please get a complete blood count and chemistry panel checked by your Primary MD at your next visit, and again as instructed by your Primary MD. Please get your medications reviewed and adjusted by your Primary MD. ° °Please request your Primary MD to go over all Hospital Tests and Procedure/Radiological results at the follow up, please get all Hospital records sent to your Prim MD by signing hospital release before you go home. ° °If you had Pneumonia of Lung problems at the Hospital: °Please get a 2 view Chest X ray done in 6-8 weeks after hospital discharge or sooner if instructed by your Primary MD. ° °If you have Congestive Heart Failure: °Please call your Cardiologist or Primary MD anytime you have any of the following symptoms:  °1) 3 pound weight gain in 24 hours or 5 pounds in 1 week  °2) shortness of breath, with or without a dry hacking cough  °3) swelling in the hands, feet or stomach  °4) if you have to sleep on extra pillows at night in order to breathe ° °Follow cardiac low salt diet and 1.5 lit/day fluid restriction. ° °If you have diabetes °Accuchecks 4 times/day, Once in AM empty stomach and then before each meal. °Log in all results and show them to your primary doctor at your next visit. °If any glucose reading is under 80 or above 300 call your primary MD immediately. ° °If you have Seizure/Convulsions/Epilepsy: °Please do not drive, operate heavy machinery, participate in activities at heights or participate in high speed sports until you have seen by Primary MD or a Neurologist and advised to do so again. ° °If you had Gastrointestinal Bleeding: °Please ask your Primary MD to check a complete blood count within one week of discharge or at your next visit. Your endoscopic/colonoscopic biopsies that are pending at the time of discharge, will also need to followed by your Primary MD. ° °Get Medicines reviewed and adjusted. °Please take all your  medications with you for your next visit with your Primary MD ° °Please request your Primary MD to go over all hospital tests and procedure/radiological results at the follow up, please ask your Primary MD to get all Hospital records sent to his/her office. ° °If you experience worsening of your admission symptoms, develop shortness of breath, life threatening emergency, suicidal or homicidal thoughts you must seek medical attention immediately by calling 911 or calling your MD immediately  if symptoms less severe. ° °You must read complete instructions/literature along with all the possible adverse reactions/side effects for all the Medicines you take and that have been prescribed to you. Take any new Medicines after you have completely understood and accpet all the possible adverse reactions/side effects.  ° °Do not drive or operate heavy machinery when taking Pain medications.  ° °Do not take more than prescribed Pain, Sleep and Anxiety Medications ° °Special Instructions: If you have smoked or chewed Tobacco  in the last 2 yrs please stop smoking, stop any regular Alcohol  and or any Recreational drug use. ° °Wear Seat belts while driving. ° °Please note °You were cared for by a hospitalist during your hospital stay. If you have any questions about your discharge medications or the care you received while you were in the hospital after you are discharged, you can call the unit and asked to speak with the hospitalist on call if the hospitalist that took care of you is not available.   you are discharged, your primary care physician will handle any further medical issues. Please note that NO REFILLS for any discharge medications will be authorized once you are discharged, as it is imperative that you return to your primary care physician (or establish a relationship with a primary care physician if you do not have one) for your aftercare needs so that they can reassess your need for medications and monitor your  lab values.  You can reach the hospitalist office at phone [phone] or fax [phone]   If you do not have a primary care physician, you can call [phone] for a physician referral.  Activity: As tolerated with Full fall precautions use walker/cane & assistance as needed  Diet: heart healthy  Disposition Home

## 2016-08-31 NOTE — Plan of Care (Signed)
Problem: Activity: Goal: Risk for activity intolerance will decrease Pt ambulating in hall throughout day and night shift

## 2016-09-02 ENCOUNTER — Other Ambulatory Visit: Payer: Self-pay | Admitting: Family Medicine

## 2016-09-02 DIAGNOSIS — F411 Generalized anxiety disorder: Secondary | ICD-10-CM

## 2016-09-04 ENCOUNTER — Ambulatory Visit: Admitting: Family Medicine
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
## 2016-09-09 ENCOUNTER — Ambulatory Visit: Admitting: Cardiology

## 2016-09-10 ENCOUNTER — Ambulatory Visit: Payer: Medicare HMO | Admitting: Cardiology
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
## 2016-09-11 ENCOUNTER — Ambulatory Visit: Payer: Medicare HMO | Admitting: Family Medicine

## 2016-09-11 ENCOUNTER — Encounter: Payer: Self-pay | Admitting: Cardiology

## 2016-10-15 ENCOUNTER — Inpatient Hospital Stay (HOSPITAL_COMMUNITY)
Admission: EM | Admit: 2016-10-15 | Discharge: 2016-10-18 | DRG: 309 | Disposition: A | Discharge status: Home / Self Care | Payer: Medicare Other | Attending: Internal Medicine | Admitting: Internal Medicine

## 2016-10-15 ENCOUNTER — Inpatient Hospital Stay (HOSPITAL_COMMUNITY): Payer: Medicare Other

## 2016-10-15 ENCOUNTER — Encounter (HOSPITAL_COMMUNITY): Payer: Self-pay

## 2016-10-15 DIAGNOSIS — I5032 Chronic diastolic (congestive) heart failure: Secondary | ICD-10-CM | POA: Diagnosis present

## 2016-10-15 DIAGNOSIS — Z9119 Patient's noncompliance with other medical treatment and regimen: Secondary | ICD-10-CM

## 2016-10-15 DIAGNOSIS — I700 Atherosclerosis of aorta: Secondary | ICD-10-CM | POA: Diagnosis present

## 2016-10-15 DIAGNOSIS — F411 Generalized anxiety disorder: Secondary | ICD-10-CM | POA: Diagnosis present

## 2016-10-15 DIAGNOSIS — N182 Chronic kidney disease, stage 2 (mild): Secondary | ICD-10-CM | POA: Diagnosis present

## 2016-10-15 DIAGNOSIS — Z8711 Personal history of peptic ulcer disease: Secondary | ICD-10-CM | POA: Diagnosis not present

## 2016-10-15 DIAGNOSIS — R55 Syncope and collapse: Secondary | ICD-10-CM

## 2016-10-15 DIAGNOSIS — F17210 Nicotine dependence, cigarettes, uncomplicated: Secondary | ICD-10-CM | POA: Diagnosis present

## 2016-10-15 DIAGNOSIS — I5033 Acute on chronic diastolic (congestive) heart failure: Secondary | ICD-10-CM | POA: Diagnosis not present

## 2016-10-15 DIAGNOSIS — Z853 Personal history of malignant neoplasm of breast: Secondary | ICD-10-CM

## 2016-10-15 DIAGNOSIS — I130 Hypertensive heart and chronic kidney disease with heart failure and stage 1 through stage 4 chronic kidney disease, or unspecified chronic kidney disease: Secondary | ICD-10-CM | POA: Diagnosis present

## 2016-10-15 DIAGNOSIS — Z720 Tobacco use: Secondary | ICD-10-CM | POA: Diagnosis not present

## 2016-10-15 DIAGNOSIS — R071 Chest pain on breathing: Secondary | ICD-10-CM | POA: Diagnosis not present

## 2016-10-15 DIAGNOSIS — T405X1A Poisoning by cocaine, accidental (unintentional), initial encounter: Secondary | ICD-10-CM | POA: Diagnosis present

## 2016-10-15 DIAGNOSIS — R000 Tachycardia, unspecified: Secondary | ICD-10-CM

## 2016-10-15 DIAGNOSIS — Z923 Personal history of irradiation: Secondary | ICD-10-CM

## 2016-10-15 DIAGNOSIS — Z79899 Other long term (current) drug therapy: Secondary | ICD-10-CM

## 2016-10-15 DIAGNOSIS — F1410 Cocaine abuse, uncomplicated: Secondary | ICD-10-CM | POA: Diagnosis not present

## 2016-10-15 DIAGNOSIS — K219 Gastro-esophageal reflux disease without esophagitis: Secondary | ICD-10-CM | POA: Diagnosis present

## 2016-10-15 DIAGNOSIS — I10 Essential (primary) hypertension: Secondary | ICD-10-CM

## 2016-10-15 DIAGNOSIS — I248 Other forms of acute ischemic heart disease: Secondary | ICD-10-CM | POA: Diagnosis present

## 2016-10-15 DIAGNOSIS — I4891 Unspecified atrial fibrillation: Secondary | ICD-10-CM | POA: Diagnosis not present

## 2016-10-15 DIAGNOSIS — R739 Hyperglycemia, unspecified: Secondary | ICD-10-CM | POA: Diagnosis present

## 2016-10-15 DIAGNOSIS — Z79811 Long term (current) use of aromatase inhibitors: Secondary | ICD-10-CM

## 2016-10-15 DIAGNOSIS — J449 Chronic obstructive pulmonary disease, unspecified: Secondary | ICD-10-CM | POA: Diagnosis present

## 2016-10-15 DIAGNOSIS — R079 Chest pain, unspecified: Secondary | ICD-10-CM | POA: Diagnosis not present

## 2016-10-15 DIAGNOSIS — E785 Hyperlipidemia, unspecified: Secondary | ICD-10-CM | POA: Diagnosis present

## 2016-10-15 DIAGNOSIS — E876 Hypokalemia: Secondary | ICD-10-CM | POA: Diagnosis not present

## 2016-10-15 LAB — URINALYSIS, ROUTINE W REFLEX MICROSCOPIC
BACTERIA UA: NONE SEEN
BILIRUBIN URINE: NEGATIVE
Glucose, UA: 50 mg/dL — AB
Hgb urine dipstick: NEGATIVE
KETONES UR: NEGATIVE mg/dL
LEUKOCYTES UA: NEGATIVE
Nitrite: NEGATIVE
PH: 5 (ref 5.0–8.0)
Specific Gravity, Urine: >1.046 — ABNORMAL HIGH (ref 1.005–1.030)

## 2016-10-15 LAB — CBG MONITORING, ED: GLUCOSE-CAPILLARY: 170 mg/dL — AB (ref 65–99)

## 2016-10-15 LAB — TROPONIN I
TROPONIN I: 0.31 ng/mL — AB (ref <0.03)
Troponin I: 0.11 ng/mL (ref <0.03)
Troponin I: 0.21 ng/mL (ref <0.03)

## 2016-10-15 LAB — RAPID URINE DRUG SCREEN, HOSP PERFORMED
Amphetamines: NOT DETECTED
Barbiturates: NOT DETECTED
Benzodiazepines: NOT DETECTED
Cocaine: POSITIVE — AB
OPIATES: NOT DETECTED
TETRAHYDROCANNABINOL: POSITIVE — AB

## 2016-10-15 LAB — BRAIN NATRIURETIC PEPTIDE: B Natriuretic Peptide: 651.5 pg/mL — ABNORMAL HIGH (ref 0.0–100.0)

## 2016-10-15 LAB — BASIC METABOLIC PANEL
Anion gap: 9 (ref 5–15)
BUN: 12 mg/dL (ref 6–20)
CHLORIDE: 106 mmol/L (ref 101–111)
CO2: 21 mmol/L — AB (ref 22–32)
CREATININE: 1.17 mg/dL — AB (ref 0.44–1.00)
Calcium: 8.5 mg/dL — ABNORMAL LOW (ref 8.9–10.3)
GFR calc non Af Amer: 48 mL/min — ABNORMAL LOW (ref >60)
GFR, EST AFRICAN AMERICAN: 55 mL/min — AB (ref >60)
Glucose, Bld: 242 mg/dL — ABNORMAL HIGH (ref 65–99)
POTASSIUM: 3.2 mmol/L — AB (ref 3.5–5.1)
SODIUM: 136 mmol/L (ref 135–145)

## 2016-10-15 LAB — CBC
HEMATOCRIT: 41.5 % (ref 36.0–46.0)
Hemoglobin: 13.8 g/dL (ref 12.0–15.0)
MCH: 29.3 pg (ref 26.0–34.0)
MCHC: 33.3 g/dL (ref 30.0–36.0)
MCV: 88.1 fL (ref 78.0–100.0)
Platelets: 349 10*3/uL (ref 150–400)
RBC: 4.71 MIL/uL (ref 3.87–5.11)
RDW: 13.9 % (ref 11.5–15.5)
WBC: 6.3 10*3/uL (ref 4.0–10.5)

## 2016-10-15 LAB — MAGNESIUM: MAGNESIUM: 1.6 mg/dL — AB (ref 1.7–2.4)

## 2016-10-15 LAB — HEPARIN LEVEL (UNFRACTIONATED): Heparin Unfractionated: 0.43 IU/mL (ref 0.30–0.70)

## 2016-10-15 LAB — MRSA PCR SCREENING: MRSA by PCR: NEGATIVE

## 2016-10-15 MED ORDER — POTASSIUM CHLORIDE CRYS ER 20 MEQ PO TBCR: 40.0000 meq | EXTENDED_RELEASE_TABLET | Freq: Every day | ORAL | Status: DC

## 2016-10-15 MED ORDER — SODIUM CHLORIDE 0.9% FLUSH
3.0000 mL | Freq: Two times a day (BID) | INTRAVENOUS | Status: DC
  Administered 2016-10-16 – 2016-10-17 (×4): 3 mL via INTRAVENOUS

## 2016-10-15 MED ORDER — FUROSEMIDE 40 MG PO TABS: 40.0000 mg | ORAL_TABLET | Freq: Every day | ORAL | Status: DC

## 2016-10-15 MED ORDER — POTASSIUM CHLORIDE CRYS ER 20 MEQ PO TBCR: 40.0000 meq | EXTENDED_RELEASE_TABLET | Freq: Once | ORAL | Status: DC

## 2016-10-15 MED ORDER — ANASTROZOLE 1 MG PO TABS
1.0000 mg | ORAL_TABLET | Freq: Every day | ORAL | Status: DC
  Administered 2016-10-16 – 2016-10-18 (×3): 1 mg via ORAL
  Filled 2016-10-15 (×3): qty 1

## 2016-10-15 MED ORDER — ALBUTEROL SULFATE (2.5 MG/3ML) 0.083% IN NEBU
2.5000 mg | INHALATION_SOLUTION | Freq: Four times a day (QID) | RESPIRATORY_TRACT | Status: DC | PRN
  Administered 2016-10-16: 2.5 mg via RESPIRATORY_TRACT
  Filled 2016-10-15: qty 3

## 2016-10-15 MED ORDER — ONDANSETRON HCL 4 MG PO TABS: 4.0000 mg | ORAL_TABLET | Freq: Four times a day (QID) | ORAL | Status: DC | PRN

## 2016-10-15 MED ORDER — DILTIAZEM HCL-DEXTROSE 100-5 MG/100ML-% IV SOLN (PREMIX)
5.0000 mg/h | INTRAVENOUS | Status: DC
  Administered 2016-10-15: 5 mg/h via INTRAVENOUS
  Filled 2016-10-15: qty 100

## 2016-10-15 MED ORDER — ASPIRIN EC 81 MG PO TBEC: 81.0000 mg | DELAYED_RELEASE_TABLET | Freq: Every day | ORAL | Status: DC

## 2016-10-15 MED ORDER — DILTIAZEM LOAD VIA INFUSION
20.0000 mg | Freq: Once | INTRAVENOUS | Status: AC
  Administered 2016-10-15: 20 mg via INTRAVENOUS
  Filled 2016-10-15: qty 20

## 2016-10-15 MED ORDER — NICOTINE 14 MG/24HR TD PT24
14.0000 mg | MEDICATED_PATCH | Freq: Every day | TRANSDERMAL | Status: DC
  Filled 2016-10-15 (×3): qty 1

## 2016-10-15 MED ORDER — HEPARIN BOLUS VIA INFUSION
3000.0000 [IU] | Freq: Once | INTRAVENOUS | Status: AC
  Administered 2016-10-15: 3000 [IU] via INTRAVENOUS
  Filled 2016-10-15: qty 3000

## 2016-10-15 MED ORDER — ACETAMINOPHEN 650 MG RE SUPP: 650.0000 mg | Freq: Four times a day (QID) | RECTAL | Status: DC | PRN

## 2016-10-15 MED ORDER — ONDANSETRON HCL 4 MG/2ML IJ SOLN: 4.0000 mg | Freq: Four times a day (QID) | INTRAMUSCULAR | Status: DC | PRN

## 2016-10-15 MED ORDER — HEPARIN (PORCINE) IN NACL 100-0.45 UNIT/ML-% IJ SOLN
900.0000 [IU]/h | INTRAMUSCULAR | Status: DC
  Administered 2016-10-15: 900 [IU]/h via INTRAVENOUS
  Filled 2016-10-15: qty 250

## 2016-10-15 MED ORDER — BUSPIRONE HCL 10 MG PO TABS
10.0000 mg | ORAL_TABLET | Freq: Two times a day (BID) | ORAL | Status: DC
  Administered 2016-10-15 – 2016-10-18 (×6): 10 mg via ORAL
  Filled 2016-10-15 (×6): qty 1

## 2016-10-15 MED ORDER — ACETAMINOPHEN 325 MG PO TABS
650.0000 mg | ORAL_TABLET | Freq: Four times a day (QID) | ORAL | Status: DC | PRN
  Administered 2016-10-15: 650 mg via ORAL
  Filled 2016-10-15: qty 2

## 2016-10-15 MED ORDER — HYDRALAZINE HCL 10 MG PO TABS
10.0000 mg | ORAL_TABLET | Freq: Three times a day (TID) | ORAL | Status: DC
  Administered 2016-10-15: 10 mg via ORAL
  Filled 2016-10-15: qty 1

## 2016-10-15 MED ORDER — DILTIAZEM HCL ER COATED BEADS 240 MG PO CP24
240.0000 mg | ORAL_CAPSULE | Freq: Every day | ORAL | Status: DC
  Administered 2016-10-16 – 2016-10-18 (×3): 240 mg via ORAL
  Filled 2016-10-15 (×3): qty 1

## 2016-10-15 MED ORDER — IOPAMIDOL (ISOVUE-370) INJECTION 76%
INTRAVENOUS | Status: AC
  Filled 2016-10-15: qty 100

## 2016-10-15 MED ORDER — POTASSIUM CHLORIDE CRYS ER 20 MEQ PO TBCR
40.0000 meq | EXTENDED_RELEASE_TABLET | Freq: Once | ORAL | Status: AC
  Administered 2016-10-15: 40 meq via ORAL
  Filled 2016-10-15: qty 2

## 2016-10-15 MED ORDER — DILTIAZEM HCL-DEXTROSE 100-5 MG/100ML-% IV SOLN (PREMIX)
5.0000 mg/h | INTRAVENOUS | Status: DC
  Administered 2016-10-16: 7.5 mg/h via INTRAVENOUS
  Filled 2016-10-15 (×2): qty 100

## 2016-10-15 MED ORDER — GABAPENTIN 300 MG PO CAPS
300.0000 mg | ORAL_CAPSULE | Freq: Three times a day (TID) | ORAL | Status: DC
  Administered 2016-10-15 – 2016-10-18 (×8): 300 mg via ORAL
  Filled 2016-10-15: qty 1
  Filled 2016-10-15 (×2): qty 3
  Filled 2016-10-15 (×5): qty 1
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
## 2016-10-15 NOTE — Progress Notes (Signed)
ANTICOAGULATION CONSULT NOTE - Follow Up Consult  Pharmacy Consult for Heparin  Indication: atrial fibrillation  No Known Allergies  Patient Measurements: Height: 5\' 2"  (157.5 cm) Weight: 120 lb (54.4 kg) IBW/kg (Calculated) : 50.1  Vital Signs: Temp: 98.3 F (36.8 C) (04/10 2000) Temp Source: Oral (04/10 2000) BP: 107/94 (04/10 2132) Pulse Rate: 50 (04/10 2000)  Labs:  Recent Labs  10/15/16 1116 10/15/16 1815 10/15/16 2258  HGB 13.8  --   --   HCT 41.5  --   --   PLT 349  --   --   HEPARINUNFRC  --   --  0.43  CREATININE 1.17*  --   --   TROPONINI 0.11* 0.31* 0.21*    Estimated Creatinine Clearance: 37.9 mL/min (A) (by C-G formula based on SCr of 1.17 mg/dL (H)).  Assessment: 66 y/o F on heparin for afib, initial heparin level is therapeutic  Goal of Therapy:  Heparin level 0.3-0.7 units/ml Monitor platelets by anticoagulation protocol: Yes   Plan:  -Cont heparin 900 units/hr -Confirmatory HL with AM labs  [name] 10/15/2016,11:54 PM

## 2016-10-15 NOTE — ED Notes (Addendum)
pts ekg showed afib rvr, kept in triage with staff on monitor waiting for room
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
## 2016-10-15 NOTE — ED Notes (Signed)
cbg was 170

## 2016-10-15 NOTE — ED Notes (Signed)
Patient at CT
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
## 2016-10-15 NOTE — ED Triage Notes (Signed)
Pt presents to the ed with complaints of dizziness with standing for the last week. Denies any pain or any other symptoms. Patient is alert and oriented x 4 and ambulatory. States she feels wobbly when she walks.

## 2016-10-15 NOTE — ED Notes (Signed)
Attempted report x1. 

## 2016-10-15 NOTE — ED Notes (Signed)
Pt stated she could give a urine sample, after informing the pt that she would have to use a bedpan because of her heart rate pt stated she would not use a bedpan, and if anybody wanted a sample she would get up and walk. RN aware and talking with pt.

## 2016-10-16 DIAGNOSIS — Z720 Tobacco use: Secondary | ICD-10-CM

## 2016-10-16 DIAGNOSIS — R079 Chest pain, unspecified: Secondary | ICD-10-CM

## 2016-10-16 DIAGNOSIS — R000 Tachycardia, unspecified: Secondary | ICD-10-CM

## 2016-10-16 DIAGNOSIS — E876 Hypokalemia: Secondary | ICD-10-CM

## 2016-10-16 DIAGNOSIS — R071 Chest pain on breathing: Secondary | ICD-10-CM

## 2016-10-16 LAB — BASIC METABOLIC PANEL
Anion gap: 9 (ref 5–15)
BUN: 21 mg/dL — ABNORMAL HIGH (ref 6–20)
CO2: 22 mmol/L (ref 22–32)
Calcium: 8.4 mg/dL — ABNORMAL LOW (ref 8.9–10.3)
Chloride: 105 mmol/L (ref 101–111)
Creatinine, Ser: 1.14 mg/dL — ABNORMAL HIGH (ref 0.44–1.00)
GFR calc Af Amer: 57 mL/min — ABNORMAL LOW (ref >60)
GFR calc non Af Amer: 49 mL/min — ABNORMAL LOW (ref >60)
Glucose, Bld: 216 mg/dL — ABNORMAL HIGH (ref 65–99)
POTASSIUM: 3.2 mmol/L — AB (ref 3.5–5.1)
Sodium: 136 mmol/L (ref 135–145)

## 2016-10-16 LAB — CBC
HEMATOCRIT: 38.2 % (ref 36.0–46.0)
HEMOGLOBIN: 12.3 g/dL (ref 12.0–15.0)
MCH: 28.5 pg (ref 26.0–34.0)
MCHC: 32.2 g/dL (ref 30.0–36.0)
MCV: 88.4 fL (ref 78.0–100.0)
Platelets: 298 10*3/uL (ref 150–400)
RBC: 4.32 MIL/uL (ref 3.87–5.11)
RDW: 14.1 % (ref 11.5–15.5)
WBC: 6.4 10*3/uL (ref 4.0–10.5)

## 2016-10-16 LAB — HEPARIN LEVEL (UNFRACTIONATED): HEPARIN UNFRACTIONATED: 0.45 [IU]/mL (ref 0.30–0.70)

## 2016-10-16 LAB — HEMOGLOBIN A1C
HEMOGLOBIN A1C: 6 % — AB (ref 4.8–5.6)
Mean Plasma Glucose: 126 mg/dL

## 2016-10-16 LAB — TROPONIN I: Troponin I: 0.14 ng/mL (ref <0.03)

## 2016-10-16 MED ORDER — FUROSEMIDE 10 MG/ML IJ SOLN
40.0000 mg | Freq: Every day | INTRAMUSCULAR | Status: DC
  Administered 2016-10-16 – 2016-10-17 (×2): 40 mg via INTRAVENOUS
  Filled 2016-10-16 (×2): qty 4

## 2016-10-16 MED ORDER — LEVALBUTEROL HCL 0.63 MG/3ML IN NEBU: 0.6300 mg | INHALATION_SOLUTION | RESPIRATORY_TRACT | Status: DC | PRN

## 2016-10-16 MED ORDER — RIVAROXABAN 20 MG PO TABS: 20.0000 mg | ORAL_TABLET | Freq: Every day | ORAL | Status: DC

## 2016-10-16 MED ORDER — HYDRALAZINE HCL 25 MG PO TABS
25.0000 mg | ORAL_TABLET | Freq: Three times a day (TID) | ORAL | Status: DC
Start: 2016-10-16 — End: 2016-10-18
  Administered 2016-10-16 – 2016-10-18 (×7): 25 mg via ORAL
  Filled 2016-10-16 (×7): qty 1

## 2016-10-16 MED ORDER — DILTIAZEM HCL-DEXTROSE 100-5 MG/100ML-% IV SOLN (PREMIX): 5.0000 mg/h | INTRAVENOUS | Status: DC

## 2016-10-16 MED ORDER — DILTIAZEM HCL 25 MG/5ML IV SOLN
5.0000 mg | Freq: Once | INTRAVENOUS | Status: DC
  Filled 2016-10-16: qty 5

## 2016-10-16 MED ORDER — MAGNESIUM SULFATE 2 GM/50ML IV SOLN
2.0000 g | Freq: Once | INTRAVENOUS | Status: AC
  Administered 2016-10-16: 2 g via INTRAVENOUS
  Filled 2016-10-16: qty 50

## 2016-10-16 MED ORDER — POTASSIUM CHLORIDE CRYS ER 20 MEQ PO TBCR
40.0000 meq | EXTENDED_RELEASE_TABLET | Freq: Two times a day (BID) | ORAL | Status: DC
  Administered 2016-10-16 – 2016-10-17 (×3): 40 meq via ORAL
  Filled 2016-10-16 (×3): qty 2

## 2016-10-16 MED ORDER — RIVAROXABAN 20 MG PO TABS
20.0000 mg | ORAL_TABLET | Freq: Every day | ORAL | Status: DC
  Administered 2016-10-16: 20 mg via ORAL
  Filled 2016-10-16 (×2): qty 1

## 2016-10-16 MED ORDER — DILTIAZEM HCL ER COATED BEADS 120 MG PO CP24
120.0000 mg | ORAL_CAPSULE | Freq: Once | ORAL | Status: AC
  Administered 2016-10-16: 120 mg via ORAL
  Filled 2016-10-16: qty 1

## 2016-10-16 NOTE — Progress Notes (Signed)
Victoria Burke  WPY:[id number] DOB: 1951-06-19 DOA: 10/15/2016 PCP: Dorena Dew, FNP    Brief Narrative:  66 y.o. 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  Shew was found to be in Afib in the ED.  She was hospitalized in Feb 2018 with acute on chronic respiratory failure secondary to a CHF exacerbation.  Patient was diagnosed with atrial fibrillation during that hospitalization but due to noncompliance and ongoing polysubstance abuse she was deemed not a good candidate for anticoagulation and discharged on aspirin.  Subjective: The patient is resting comfortably in bed.  She denies shortness of breath fevers chills nausea vomiting or abdominal pain.  She continues to have some pleuritic type pain in the lateral left lower chest area with deep breaths.  Assessment & Plan:  Atrial fibrillation with RVR  missed outpatient follow-up with cardiology - CHADS2vasc is 4 - now on oral cardizem and Xarelto - CM to assist w/ obtaining Xarelto as outpt (may be challenging) - HR not yet ideal - further titration of meds may be required - keep in SDU over night in case return to IV gtt is required   Chest pain with elevated troponin suspect due to recent cocaine use - CTa chest negative for PE - pain pleuritic in nature - trop not signif elevated most c/w demand related to tachycardia   Hypokalemia Cont to replace and follow  Hypomagnesemia Replace to goal of 2.0  Chronic diastolic CHF Last TTE 0/5397 w/ EF 65-70%  Ongoing cocaine and tobacco abuse Counseled strongly on cessation of both   Uncontrolled hypertension BP now well controlled on home med regimen, suggesting noncompliance as outpt    Chronic kidney disease stage II crt stable at baseline   COPD Well compensated at this time   Hyperglycemia  Elevated blood glucose - A1c 07/2015 was 6.2 - repeat this admit 6.0 - not yet true DM   History of breast cancer in remission Continue anastrozole  DVT prophylaxis: Xarelto  Code Status: FULL CODE Family Communication: no family present at time of exam  Disposition Plan:   Consultants:  Cardiology   Procedures: none  Antimicrobials:  none  Objective: Blood pressure 112/86, pulse 90, temperature 98.3 F (36.8 C), temperature source Oral, resp. rate (!) 27, height 5\' 2"  (1.575 m), weight 52 kg (114 lb 10.2 oz), SpO2 93 %.  Intake/Output Summary (Last 24 hours) at 10/16/16 1346 Last data filed at 10/16/16 1019  Gross per 24 hour  Intake            295.7 ml  Output              950 ml  Net           -654.3 ml   Filed Weights   10/15/16 1109 10/16/16 0600  Weight: 54.4 kg (120 lb) 52 kg (114 lb 10.2 oz)    Examination: General: No acute respiratory distress Lungs: Clear to auscultation bilaterally without wheezes or crackles Cardiovascular: Irreg irreg - rate ~110bpm - no M  Abdomen: Nontender, nondistended, soft, bowel sounds positive, no rebound, no ascites, no appreciable mass Extremities: No significant cyanosis, clubbing, or edema bilateral lower extremities  CBC:  Recent Labs Lab 10/15/16 1116 10/16/16 0614  WBC 6.3 6.4  HGB 13.8 12.3  HCT 41.5 38.2  MCV 88.1 88.4  PLT 349 159   Basic Metabolic Panel:  Recent Labs Lab 10/15/16 1116 10/15/16 1815 10/16/16 0614  NA 136  --  136  K 3.2*  --  3.2*  CL 106  --  105  CO2 21*  --  22  GLUCOSE 242*  --  216*  BUN 12  --  21*  CREATININE 1.17*  --  1.14*  CALCIUM 8.5*  --  8.4*  MG  --  1.6*  --    GFR: Estimated Creatinine Clearance: 38.9 mL/min (A) (by C-G formula based on SCr of 1.14 mg/dL (H)).  Liver Function Tests: No results for input(s): AST, ALT, ALKPHOS, BILITOT, PROT,  ALBUMIN in the last 168 hours. No results for input(s): LIPASE, AMYLASE in the last 168 hours. No results for input(s): AMMONIA in the last 168 hours.  Coagulation Profile: No results for input(s): INR, PROTIME in the last 168 hours.  Cardiac Enzymes:  Recent Labs Lab 10/15/16 1116 10/15/16 1815 10/15/16 2258 10/16/16 0614  TROPONINI 0.11* 0.31* 0.21* 0.14*    HbA1C: Hgb A1c MFr Bld  Date/Time Value Ref Range Status  10/15/2016 11:16 AM 6.0 (H) 4.8 - 5.6 % Final    Comment:    (NOTE)         Pre-diabetes: 5.7 - 6.4         Diabetes: >6.4         Glycemic control for adults with diabetes: <7.0   07/20/2015 04:36 AM 6.2 (H) 4.8 - 5.6 % Final    Comment:    (NOTE)         Pre-diabetes: 5.7 - 6.4         Diabetes: >6.4         Glycemic control for adults with diabetes: <7.0     CBG:  Recent Labs Lab 10/15/16 1248  [place] 170*    Recent Results (from the past 240 hour(s))  MRSA PCR Screening     Status: None   Collection Time: 10/15/16  6:38 PM  Result Value Ref Range Status   MRSA by PCR NEGATIVE NEGATIVE Final    Comment:        The GeneXpert MRSA Assay (FDA approved for NASAL specimens only), is one component of a comprehensive MRSA colonization surveillance program. It is not intended to diagnose MRSA infection nor to guide or monitor treatment for MRSA infections.      Scheduled Meds: . anastrozole  1 mg Oral Daily  . busPIRone  10 mg Oral BID  . diltiazem  240 mg Oral Daily  . furosemide  40 mg Intravenous Daily  . gabapentin  300 mg Oral TID  . hydrALAZINE  25 mg Oral TID  . nicotine  14 mg Transdermal Daily  . potassium chloride  40 mEq Oral Once  . potassium chloride SA  40 mEq Oral BID  . rivaroxaban  20 mg Oral Q supper  . sodium chloride flush  3 mL Intravenous Q12H     LOS: 1 day   Cherene Altes, MD [facility] Office  [phone] Pager - Text Page per Amion as per below:  On-Call/Text Page:      [email or web address]       password [email or web address]  If 7PM-7AM, please contact night-coverage [email or web address] Password [email or web address] 10/16/2016, 1:46 PM

## 2016-10-16 NOTE — Progress Notes (Signed)
Patient cardiac rhythm  Afib rate 120-140s Bp  138/87 Dr Meda Coffee made aware. See new orders

## 2016-10-16 NOTE — Care Management Note (Signed)
Case Management Note  Patient Details  Name: Victoria Burke MRN: [id number] Date of Birth: [birth year]  Subjective/Objective:   Pt admitted for A fib                 Action/Plan:   PTA independent.  CM provided free 30 day Xarellto card, submitted benefit check that yielded copay of $3.70 with current insurance.  CM contacted pharmacy [facility] choice [facility] [address] unable to reach anyone as voicemail was from January.  CM instructed pt to pick up medication from a larger chain pharmacy - pt chose [facility] on [address].  Pt denied barriers to returning home at discharge.  CSW consulted for current substance abuse.  CM will continue to follow for discharge needs  Expected Discharge Date:  10/18/16               Expected Discharge Plan:  Home/Self Care  In-House Referral:     Discharge planning Services  CM Consult, Medication Assistance  Post Acute Care Choice:    Choice offered to:     DME Arranged:    DME Agency:     HH Arranged:    HH Agency:     Status of Service:  In process, will continue to follow  If discussed at Long Length of Stay Meetings, dates discussed:    Additional Comments:  Maryclare Labrador, RN 10/16/2016, 3:54 PM

## 2016-10-16 NOTE — Progress Notes (Addendum)
Patient Name: Victoria Burke Date of Encounter: 10/16/2016  Primary Cardiologist: Dr. Cliffton Asters Problem List     Principal Problem:   Atrial fibrillation with RVR [facility]) Active Problems:   Hypokalemia   Tobacco abuse   Cocaine abuse   Chronic obstructive pulmonary disease (HCC)   Essential hypertension   Generalized anxiety disorder   Chest pain in adult     Subjective   No complaints this am  Inpatient Medications    Scheduled Meds: . anastrozole  1 mg Oral Daily  . aspirin EC  81 mg Oral Daily  . busPIRone  10 mg Oral BID  . diltiazem  240 mg Oral Daily  . furosemide  40 mg Oral Daily  . gabapentin  300 mg Oral TID  . hydrALAZINE  10 mg Oral TID  . nicotine  14 mg Transdermal Daily  . potassium chloride  40 mEq Oral Once  . potassium chloride SA  40 mEq Oral Daily  . sodium chloride flush  3 mL Intravenous Q12H   Continuous Infusions: . diltiazem (CARDIZEM) infusion 10 mg/hr (10/15/16 1350)  . diltiazem (CARDIZEM) infusion 10 mg/hr (10/16/16 0600)  . heparin 900 Units/hr (10/15/16 1732)   PRN Meds: acetaminophen **OR** acetaminophen, albuterol, ondansetron **OR** ondansetron (ZOFRAN) IV   Vital Signs    Vitals:   10/16/16 0400 10/16/16 0430 10/16/16 0500 10/16/16 0600  BP: 131/82  (!) 151/107 (!) 139/117  Pulse: (!) 50  (!) 136 (!) 45  Resp: (!) 25  (!) 27 (!) 28  Temp:  97.5 F (36.4 C)    TempSrc:  Oral    SpO2: 94%  92% 96%  Weight:    114 lb 10.2 oz (52 kg)  Height:        Intake/Output Summary (Last 24 hours) at 10/16/16 0821 Last data filed at 10/16/16 0430  Gross per 24 hour  Intake             55.7 ml  Output              600 ml  Net           -544.3 ml   Filed Weights   10/15/16 1109 10/16/16 0600  Weight: 120 lb (54.4 kg) 114 lb 10.2 oz (52 kg)    Physical Exam    GEN: Well nourished, well developed, in no acute distress.  HEENT: Grossly normal.  Neck: Supple, no JVD, carotid bruits, or masses. Cardiac: irregularly  irregular, no murmurs, rubs, or gallops. No clubbing, cyanosis, edema.  Radials/DP/PT 2+ and equal bilaterally.  Respiratory:  Respirations regular and unlabored, clear to auscultation bilaterally. GI: Soft, nontender, nondistended, BS + x 4. MS: no deformity or atrophy. Skin: warm and dry, no rash. Neuro:  Strength and sensation are intact. Psych: AAOx3.  Normal affect.  Labs    CBC  Recent Labs  10/15/16 1116 10/16/16 0614  WBC 6.3 6.4  HGB 13.8 12.3  HCT 41.5 38.2  MCV 88.1 88.4  PLT 349 735   Basic Metabolic Panel  Recent Labs  10/15/16 1116 10/15/16 1815 10/16/16 0614  NA 136  --  136  K 3.2*  --  3.2*  CL 106  --  105  CO2 21*  --  22  GLUCOSE 242*  --  216*  BUN 12  --  21*  CREATININE 1.17*  --  1.14*  CALCIUM 8.5*  --  8.4*  MG  --  1.6*  --  Liver Function Tests No results for input(s): AST, ALT, ALKPHOS, BILITOT, PROT, ALBUMIN in the last 72 hours. No results for input(s): LIPASE, AMYLASE in the last 72 hours. Cardiac Enzymes  Recent Labs  10/15/16 1815 10/15/16 2258 10/16/16 0614  TROPONINI 0.31* 0.21* 0.14*   BNP Invalid input(s): POCBNP D-Dimer No results for input(s): DDIMER in the last 72 hours. Hemoglobin A1C  Recent Labs  10/15/16 1116  HGBA1C 6.0*   Fasting Lipid Panel No results for input(s): CHOL, HDL, LDLCALC, TRIG, CHOLHDL, LDLDIRECT in the last 72 hours. Thyroid Function Tests No results for input(s): TSH, T4TOTAL, T3FREE, THYROIDAB in the last 72 hours.  Invalid input(s): FREET3  Telemetry    Atrial fibrillation with RVR at 105bpm with PVCs - Personally Reviewed  ECG    Atrial fibrillation with RVR - Personally Reviewed  Radiology    Ct Angio Chest Pe W Or Wo Contrast  Result Date: 10/15/2016 CLINICAL DATA:  Chest pain, dizziness and tachycardia over the last week. EXAM: CT ANGIOGRAPHY CHEST WITH CONTRAST TECHNIQUE: Multidetector CT imaging of the chest was performed using the standard protocol during bolus  administration of intravenous contrast. Multiplanar CT image reconstructions and MIPs were obtained to evaluate the vascular anatomy. CONTRAST:  100 cc Isovue 370 COMPARISON:  Radiography 08/28/2016.  CT 08/27/2016. FINDINGS: Cardiovascular: Pulmonary arterial opacification is excellent. There are no pulmonary emboli. There is aortic atherosclerosis. Coronary artery calcification is present. The heart is at the upper limits of normal in size. Small amount of pericardial fluid. Mediastinum/Nodes: No hilar or mediastinal mass. Lungs/Pleura: Moderate size pleural effusion on the right layering dependently with dependent pulmonary atelectasis. No pleural fluid on the left. Emphysema with peripheral blebs. There may be very mild interstitial edema, but no advanced edema. The lungs are otherwise clear besides the dependent atelectasis. Upper Abdomen: Negative Musculoskeletal: Several minimal superior endplate deformities, unchanged from the previous study. No acute finding suspected. Review of the MIP images confirms the above findings. IMPRESSION: No pulmonary emboli. Moderate right effusion layering dependently with dependent pulmonary atelectasis. Underlying emphysema. Question mild interstitial edema. Electronically Signed   By: Nelson Chimes M.D.   On: 10/15/2016 17:40    Cardiac Studies   2D echo 07/2016 Study Conclusions  - Left ventricle: The cavity size was normal. There was moderate   concentric hypertrophy. Systolic function was vigorous. The   estimated ejection fraction was in the range of 65% to 70%. Wall   motion was normal; there were no regional wall motion   abnormalities. The study was not technically sufficient to allow   evaluation of LV diastolic dysfunction due to atrial   fibrillation. - Aortic valve: Trileaflet; mildly thickened, mildly calcified   leaflets. There was no regurgitation. - Mitral valve: Structurally normal valve. There was mild   regurgitation. - Left atrium: The  atrium was moderately dilated. - Right ventricle: The cavity size was normal. Wall thickness was   normal. Systolic function was normal. - Right atrium: The atrium was mildly dilated. - Tricuspid valve: There was mild regurgitation. - Pulmonic valve: There was no regurgitation. - Pulmonary arteries: Systolic pressure was mildly increased. PA   peak pressure: 39 mm Hg (S). - Inferior vena cava: The vessel was normal in size.  Patient Profile     Victoria Burke is a 66 y.o. female who is being seen today for the evaluation of atrial fibrillation with rapid ventricular response at the request of Dr. Ashok Cordia. The patient has a past medical history significant for  breast CA s/p radiation in 2013, COPD,GERD, h/o gastric ulcer, cocaine use,HTN and HLD.  She has been feeling weak and tired for a couple of weeks or more. She was recently incarcerated for about a month being released last Thursday. She was given her meds in the facility and she reports compliance with her meds at home although she is not able to tell me what she takes. She reports her last use of cocaine was last week after she was released. She continues to smoke about 1/3 PPD. She reports that she drinks one beer occasionally, not more than 1 or 2 a week or less. Today she noted chest pain on deep breathing. She has no exertional type chest discomfort. She has chronic shortness of breath with just walking around the house which is not worse recently. She has occasional palpitations a few times a week. She has no orthhopnea, swelling, PND. No respiratory symptoms of cough, congestion, wheezing.    Assessment & Plan    Atrial fibrillation with rapid ventricular response -Pt has had MAT/afib during recent hospitalizations in January and February and out patient follow up had been arranged, however, she did not keep the appointments. Pt has had recent stressors related to being incarcerated for about a month.  -Was on Xarelto at one point  but that was stopped on last hospitalization due to concern about compliance -currently on diltiazem drip at 10 mg/hr with moderate improvement in rate. 100-105bpm.  Will change to Cardizem CD 240mg  daily (she is on 10mg /hr currently). -Avoid BB use due to cocaine use -CHA2DS2/VAS Stroke Risk Score is 4 (HTN, Age , DM, female). Pt is not a good candidate for coumadin due to non compliance and substance abuse. Will stop Heparin gtt and switch to Xarelto 20 mg daily -Will consult case management to look into obtaining Xarelto. -I had a long discussion with patient in regards to stroke risk if not compliant with her Xarelto and she understands.  chest pain  -with deep breathing, non-exertional, tachycadia and mildly elevated troponin. CTA chest negative for PE but did show some pleural fluid. -Troponin 0.11>0.31>0.21>0.14, mildly elevated consistent with demand ischemia in setting of tachycardia and CHF.  - CP is pleuritic and not c/w ACS.  2D echo with normal LVF in January. -d/c IV Heparin gtt  Cocaine use -Pt reports last use was late last week  Acute on chronic diastolic dysfunction -LV EF was 65-70% in 07/2016 -mild interstitial edema and pleural effusions on CTA and BNP elevated at 651 - likely related to afib with RVR.   - Lasix 40mg  IV daily - follow renal function and potassium  Hypertension -BP labile this am and elevated as high as 139/179mmHg.  -Home meds include diltiazem 180 mg daily, lasix 40 mg daily, hydralazine 10 mg tid. Questionable compliance.  -increase Hydralazine to 25mg  TID.   Chronic renal insufficiency -SCr 1.17 (baseline 1.03-1.45) -follow closely while diuresing  Hypokalemia -K+ 3.2. 40 mEq KCl given orally.  -Replete this am and increase to 85meq BID  Tobacco use -1/3 PPD. Advise cessation.  I have spent a total of 35 minutes with patient reviewing CT scan, 2D echo , telemetry, EKGs, labs and examining patient as well as establishing an assessment  and plan that was discussed with the patient.  > 50% of time was spent in direct patient care.    Signed, Fransico Him, MD  10/16/2016, 8:21 AM

## 2016-10-16 NOTE — Progress Notes (Signed)
ANTICOAGULATION CONSULT NOTE - Follow Up Consult  Pharmacy Consult for Heparin >>xarelto Indication: atrial fibrillation  No Known Allergies  Patient Measurements: Height: 5\' 2"  (157.5 cm) Weight: 114 lb 10.2 oz (52 kg) IBW/kg (Calculated) : 50.1  Vital Signs: Temp: 98.5 F (36.9 C) (04/11 0844) Temp Source: Oral (04/11 0844) BP: 128/112 (04/11 0844) Pulse Rate: 96 (04/11 0844)  Labs:  Recent Labs  10/15/16 1116 10/15/16 1815 10/15/16 2258 10/16/16 0614  HGB 13.8  --   --  12.3  HCT 41.5  --   --  38.2  PLT 349  --   --  298  HEPARINUNFRC  --   --  0.43 0.45  CREATININE 1.17*  --   --  1.14*  TROPONINI 0.11* 0.31* 0.21* 0.14*    Estimated Creatinine Clearance: 38.9 mL/min (A) (by C-G formula based on SCr of 1.14 mg/dL (H)).  Assessment: 66 y/o F on heparin for afib, heparin level is therapeutic this morning. New orders to change to xarelto. She has been prescribed this before but it was stopped d/t compliance concerns.   Hgb down slightly, no bleeding issues noted.  Goal of Therapy:  Heparin level 0.3-0.7 units/ml Monitor platelets by anticoagulation protocol: Yes   Plan:  -stop heparin this morning and transition to xarelto 20mg  daily  Erin Hearing PharmD., BCPS Clinical Pharmacist Pager [phone] 10/16/2016 8:58 AM

## 2016-10-17 ENCOUNTER — Inpatient Hospital Stay (HOSPITAL_COMMUNITY): Payer: Medicare Other

## 2016-10-17 DIAGNOSIS — R079 Chest pain, unspecified: Secondary | ICD-10-CM

## 2016-10-17 DIAGNOSIS — I5033 Acute on chronic diastolic (congestive) heart failure: Secondary | ICD-10-CM

## 2016-10-17 LAB — BASIC METABOLIC PANEL
Anion gap: 6 (ref 5–15)
BUN: 20 mg/dL (ref 6–20)
CALCIUM: 8.5 mg/dL — AB (ref 8.9–10.3)
CHLORIDE: 109 mmol/L (ref 101–111)
CO2: 21 mmol/L — ABNORMAL LOW (ref 22–32)
CREATININE: 1.12 mg/dL — AB (ref 0.44–1.00)
GFR, EST AFRICAN AMERICAN: 58 mL/min — AB (ref >60)
GFR, EST NON AFRICAN AMERICAN: 50 mL/min — AB (ref >60)
Glucose, Bld: 105 mg/dL — ABNORMAL HIGH (ref 65–99)
Potassium: 4.4 mmol/L (ref 3.5–5.1)
SODIUM: 136 mmol/L (ref 135–145)

## 2016-10-17 LAB — CBC
HCT: 37.8 % (ref 36.0–46.0)
Hemoglobin: 12.1 g/dL (ref 12.0–15.0)
MCH: 28.3 pg (ref 26.0–34.0)
MCHC: 32 g/dL (ref 30.0–36.0)
MCV: 88.5 fL (ref 78.0–100.0)
PLATELETS: 326 10*3/uL (ref 150–400)
RBC: 4.27 MIL/uL (ref 3.87–5.11)
RDW: 14.2 % (ref 11.5–15.5)
WBC: 6.3 10*3/uL (ref 4.0–10.5)

## 2016-10-17 LAB — MAGNESIUM: MAGNESIUM: 1.9 mg/dL (ref 1.7–2.4)

## 2016-10-17 MED ORDER — METOPROLOL TARTRATE 50 MG PO TABS
50.0000 mg | ORAL_TABLET | ORAL | Status: AC
  Administered 2016-10-17: 50 mg via ORAL
  Filled 2016-10-17: qty 1

## 2016-10-17 MED ORDER — NITROGLYCERIN 0.4 MG SL SUBL
SUBLINGUAL_TABLET | SUBLINGUAL | Status: AC
  Administered 2016-10-17: 0.8 mg
  Filled 2016-10-17: qty 2

## 2016-10-17 MED ORDER — FUROSEMIDE 40 MG PO TABS
40.0000 mg | ORAL_TABLET | Freq: Every day | ORAL | Status: DC
  Administered 2016-10-18: 40 mg via ORAL
  Filled 2016-10-17: qty 1

## 2016-10-17 MED ORDER — METOPROLOL TARTRATE 5 MG/5ML IV SOLN
INTRAVENOUS | Status: AC
  Administered 2016-10-17: 5 mg
  Filled 2016-10-17: qty 5

## 2016-10-17 MED ORDER — DILTIAZEM HCL 30 MG PO TABS: 30.0000 mg | ORAL_TABLET | Freq: Once | ORAL | Status: DC

## 2016-10-17 MED ORDER — POTASSIUM CHLORIDE CRYS ER 20 MEQ PO TBCR
40.0000 meq | EXTENDED_RELEASE_TABLET | Freq: Every day | ORAL | Status: DC
  Administered 2016-10-18: 40 meq via ORAL
  Filled 2016-10-17: qty 2

## 2016-10-17 MED ORDER — NITROGLYCERIN 0.4 MG SL SUBL
SUBLINGUAL_TABLET | SUBLINGUAL | Status: AC
  Filled 2016-10-17: qty 2

## 2016-10-17 MED ORDER — METOPROLOL TARTRATE 5 MG/5ML IV SOLN
INTRAVENOUS | Status: AC
  Filled 2016-10-17: qty 5

## 2016-10-17 MED ORDER — IOPAMIDOL (ISOVUE-370) INJECTION 76%
INTRAVENOUS | Status: AC
  Filled 2016-10-17: qty 100

## 2016-10-17 NOTE — Progress Notes (Signed)
[place] TEAM 1 - Stepdown/ICU TEAM  Victoria Burke  FVC:[id number] DOB: December 27, 1950 DOA: 10/15/2016 PCP: Dorena Dew, FNP    Brief Narrative:  66 y.o. 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  Shew was found to be in Afib in the ED.  She was hospitalized in Feb 2018 with acute on chronic respiratory failure secondary to a CHF exacerbation.  Patient was diagnosed with atrial fibrillation during that hospitalization but due to noncompliance and ongoing polysubstance abuse she was deemed not a good candidate for anticoagulation and discharged on aspirin.  Subjective: The patient is resting comfortably. No complaints.   Assessment & Plan:  Atrial fibrillation with RVR, improved   - missed outpatient follow-up with cardiology - CHADS2vasc is 4  -Continue Cardizem 240 mg daily. One beta blocker use due to cocaine use -She's not a good candidate for anticoagulation given high risk from her polysubstance use and medical noncompliance. She understands the risk and I have counseled her against illicit drug use. -Can follow-up outpatient with cardiology and if her drug abuse behavior changes at that point she can be considered for anticoagulation  Chest pain with elevated troponin suspect due to recent cocaine use - CTa chest negative for PE - pain pleuritic in nature  -Cardiac CT ordered by cardiology. Follow-up  Hypokalemia & Hypopomagnesemia; resolved  Replace to goal of K >4 and Mg >2  Chronic diastolic CHF -restart home lasix 40mg  daily  Ongoing cocaine and tobacco abuse Counseled strongly on cessation of both   Uncontrolled hypertension BP now well controlled on home med regimen, suggesting noncompliance as outpt    Chronic kidney disease stage II crt stable at baseline   COPD Well compensated at this time   Hyperglycemia  Elevated blood glucose - A1c 07/2015 was 6.2 - repeat this admit 6.0 - not yet true DM   History of breast cancer in remission Continue anastrozole  DVT prophylaxis: Xarelto  Code Status: FULL CODE Family Communication: no family present at time of exam  Disposition Plan: Discharge 24 hrs. Tx to Tele today.   Consultants:  Cardiology   Procedures: none  Antimicrobials:  none  Objective: Blood pressure (!) 154/86, pulse 78, temperature 97.5 F (36.4 C), temperature source Oral, resp. rate (!) 29, height 5\' 2"  (1.575 m), weight 52.4 kg (115 lb 9.6 oz), SpO2 94 %.  Intake/Output Summary (Last 24 hours) at 10/17/16 1420 Last data filed at 10/16/16 2000  Gross per 24 hour  Intake              120 ml  Output                0 ml  Net              120 ml   Filed Weights   10/15/16 1109 10/16/16 0600 10/17/16 0400  Weight: 54.4 kg (120 lb) 52 kg (114 lb 10.2 oz) 52.4 kg (115 lb 9.6 oz)    Examination: General: No acute respiratory distress Lungs: Clear to auscultation bilaterally without wheezes or crackles Cardiovascular: Irreg irreg - rate ~110bpm - no M  Abdomen: Nontender, nondistended, soft, bowel sounds positive, no rebound, no ascites, no appreciable mass Extremities: No significant cyanosis, clubbing, or edema bilateral lower extremities  CBC:  Recent Labs Lab  10/15/16 1116 10/16/16 0614 10/17/16 0209  WBC 6.3 6.4 6.3  HGB 13.8 12.3 12.1  HCT 41.5 38.2 37.8  MCV 88.1 88.4 88.5  PLT 349 298 102   Basic Metabolic Panel:  Recent Labs Lab 10/15/16 1116 10/15/16 1815 10/16/16 0614 10/17/16 0209  NA 136  --  136 136  K 3.2*  --  3.2* 4.4  CL 106  --  105 109  CO2 21*  --  22 21*  GLUCOSE 242*  --  216* 105*  BUN 12  --  21* 20  CREATININE 1.17*  --  1.14* 1.12*  CALCIUM 8.5*  --  8.4* 8.5*  MG  --  1.6*  --  1.9   GFR: Estimated  Creatinine Clearance: 39.6 mL/min (A) (by C-G formula based on SCr of 1.12 mg/dL (H)).  Liver Function Tests: No results for input(s): AST, ALT, ALKPHOS, BILITOT, PROT, ALBUMIN in the last 168 hours. No results for input(s): LIPASE, AMYLASE in the last 168 hours. No results for input(s): AMMONIA in the last 168 hours.  Coagulation Profile: No results for input(s): INR, PROTIME in the last 168 hours.  Cardiac Enzymes:  Recent Labs Lab 10/15/16 1116 10/15/16 1815 10/15/16 2258 10/16/16 0614  TROPONINI 0.11* 0.31* 0.21* 0.14*    HbA1C: Hgb A1c MFr Bld  Date/Time Value Ref Range Status  10/15/2016 11:16 AM 6.0 (H) 4.8 - 5.6 % Final    Comment:    (NOTE)         Pre-diabetes: 5.7 - 6.4         Diabetes: >6.4         Glycemic control for adults with diabetes: <7.0   07/20/2015 04:36 AM 6.2 (H) 4.8 - 5.6 % Final    Comment:    (NOTE)         Pre-diabetes: 5.7 - 6.4         Diabetes: >6.4         Glycemic control for adults with diabetes: <7.0     CBG:  Recent Labs Lab 10/15/16 1248  [place] 170*    Recent Results (from the past 240 hour(s))  MRSA PCR Screening     Status: None   Collection Time: 10/15/16  6:38 PM  Result Value Ref Range Status   MRSA by PCR NEGATIVE NEGATIVE Final    Comment:        The GeneXpert MRSA Assay (FDA approved for NASAL specimens only), is one component of a comprehensive MRSA colonization surveillance program. It is not intended to diagnose MRSA infection nor to guide or monitor treatment for MRSA infections.      Scheduled Meds: . anastrozole  1 mg Oral Daily  . busPIRone  10 mg Oral BID  . diltiazem  240 mg Oral Daily  . furosemide  40 mg Oral Daily  . gabapentin  300 mg Oral TID  . hydrALAZINE  25 mg Oral TID  . iopamidol      . metoprolol      . nicotine  14 mg Transdermal Daily  . nitroGLYCERIN      . potassium chloride  40 mEq Oral Once  . [START ON 10/18/2016] potassium chloride SA  40 mEq Oral Daily  . sodium  chloride flush  3 mL Intravenous Q12H     LOS: 2 days   Gerlean Ren, MD [facility] Office  [phone] Pager - Text Page per Shea Evans as per below:  On-Call/Text Page:      [email or web address]  password [email or web address]  If 7PM-7AM, please contact night-coverage [email or web address] Password [email or web address] 10/17/2016, 2:20 PM

## 2016-10-17 NOTE — Progress Notes (Addendum)
Patient Name: Victoria Burke Date of Encounter: 10/17/2016  Primary Cardiologist: Dr. [facility] Problem List     Principal Problem:   Atrial fibrillation with RVR [facility]) Active Problems:   Hypokalemia   Tobacco abuse   Cocaine abuse   Chronic obstructive pulmonary disease (HCC)   Essential hypertension   Generalized anxiety disorder   Chest pain in adult   Chest pain     Subjective   Denies chest pain, SOB or palpitations  Inpatient Medications    Scheduled Meds: . anastrozole  1 mg Oral Daily  . busPIRone  10 mg Oral BID  . diltiazem  240 mg Oral Daily  . furosemide  40 mg Intravenous Daily  . gabapentin  300 mg Oral TID  . hydrALAZINE  25 mg Oral TID  . nicotine  14 mg Transdermal Daily  . potassium chloride  40 mEq Oral Once  . potassium chloride SA  40 mEq Oral BID  . rivaroxaban  20 mg Oral Q supper  . sodium chloride flush  3 mL Intravenous Q12H   Continuous Infusions:  PRN Meds: acetaminophen **OR** acetaminophen, levalbuterol, ondansetron **OR** ondansetron (ZOFRAN) IV   Vital Signs    Vitals:   10/16/16 2307 10/17/16 0000 10/17/16 0400 10/17/16 0745  BP: 125/82 119/67 123/89   Pulse: (!) 51 68 96   Resp: (!) 33 (!) 25 (!) 27   Temp: 98.6 F (37 C)  98.1 F (36.7 C) 98.2 F (36.8 C)  TempSrc: Oral  Oral Oral  SpO2: 96% 96% 96%   Weight:   115 lb 9.6 oz (52.4 kg)   Height:        Intake/Output Summary (Last 24 hours) at 10/17/16 0839 Last data filed at 10/16/16 2000  Gross per 24 hour  Intake              360 ml  Output              350 ml  Net               10 ml   Filed Weights   10/15/16 1109 10/16/16 0600 10/17/16 0400  Weight: 120 lb (54.4 kg) 114 lb 10.2 oz (52 kg) 115 lb 9.6 oz (52.4 kg)    Physical Exam    GEN: WD, WN in NAD HEENT: normal Neck: no JVD or bruits Cardiac:irregularly irregular with no M/R/G Respiratory: CTA bilaterally GI: soft, NT, ND with active BS MS: no deformity or edema Skin: warm and  dry Neuro:  A&O x 3 Psych: normal affect  Labs    CBC  Recent Labs  10/16/16 0614 10/17/16 0209  WBC 6.4 6.3  HGB 12.3 12.1  HCT 38.2 37.8  MCV 88.4 88.5  PLT 298 681   Basic Metabolic Panel  Recent Labs  10/15/16 1815 10/16/16 0614 10/17/16 0209  NA  --  136 136  K  --  3.2* 4.4  CL  --  105 109  CO2  --  22 21*  GLUCOSE  --  216* 105*  BUN  --  21* 20  CREATININE  --  1.14* 1.12*  CALCIUM  --  8.4* 8.5*  MG 1.6*  --  1.9   Liver Function Tests No results for input(s): AST, ALT, ALKPHOS, BILITOT, PROT, ALBUMIN in the last 72 hours. No results for input(s): LIPASE, AMYLASE in the last 72 hours. Cardiac Enzymes  Recent Labs  10/15/16 1815 10/15/16 2258 10/16/16 0614  TROPONINI 0.31*  0.21* 0.14*   BNP Invalid input(s): POCBNP D-Dimer No results for input(s): DDIMER in the last 72 hours. Hemoglobin A1C  Recent Labs  10/15/16 1116  HGBA1C 6.0*   Fasting Lipid Panel No results for input(s): CHOL, HDL, LDLCALC, TRIG, CHOLHDL, LDLDIRECT in the last 72 hours. Thyroid Function Tests No results for input(s): TSH, T4TOTAL, T3FREE, THYROIDAB in the last 72 hours.  Invalid input(s): FREET3  Telemetry    Atrial fibrillation with RVR at 105bpm with PVCs - Personally Reviewed  ECG    Atrial fibrillation with RVR - Personally Reviewed  Radiology    Ct Angio Chest Pe W Or Wo Contrast  Result Date: 10/15/2016 CLINICAL DATA:  Chest pain, dizziness and tachycardia over the last week. EXAM: CT ANGIOGRAPHY CHEST WITH CONTRAST TECHNIQUE: Multidetector CT imaging of the chest was performed using the standard protocol during bolus administration of intravenous contrast. Multiplanar CT image reconstructions and MIPs were obtained to evaluate the vascular anatomy. CONTRAST:  100 cc Isovue 370 COMPARISON:  Radiography 08/28/2016.  CT 08/27/2016. FINDINGS: Cardiovascular: Pulmonary arterial opacification is excellent. There are no pulmonary emboli. There is aortic  atherosclerosis. Coronary artery calcification is present. The heart is at the upper limits of normal in size. Small amount of pericardial fluid. Mediastinum/Nodes: No hilar or mediastinal mass. Lungs/Pleura: Moderate size pleural effusion on the right layering dependently with dependent pulmonary atelectasis. No pleural fluid on the left. Emphysema with peripheral blebs. There may be very mild interstitial edema, but no advanced edema. The lungs are otherwise clear besides the dependent atelectasis. Upper Abdomen: Negative Musculoskeletal: Several minimal superior endplate deformities, unchanged from the previous study. No acute finding suspected. Review of the MIP images confirms the above findings. IMPRESSION: No pulmonary emboli. Moderate right effusion layering dependently with dependent pulmonary atelectasis. Underlying emphysema. Question mild interstitial edema. Electronically Signed   By: Nelson Chimes M.D.   On: 10/15/2016 17:40    Cardiac Studies   2D echo 07/2016 Study Conclusions  - Left ventricle: The cavity size was normal. There was moderate   concentric hypertrophy. Systolic function was vigorous. The   estimated ejection fraction was in the range of 65% to 70%. Wall   motion was normal; there were no regional wall motion   abnormalities. The study was not technically sufficient to allow   evaluation of LV diastolic dysfunction due to atrial   fibrillation. - Aortic valve: Trileaflet; mildly thickened, mildly calcified   leaflets. There was no regurgitation. - Mitral valve: Structurally normal valve. There was mild   regurgitation. - Left atrium: The atrium was moderately dilated. - Right ventricle: The cavity size was normal. Wall thickness was   normal. Systolic function was normal. - Right atrium: The atrium was mildly dilated. - Tricuspid valve: There was mild regurgitation. - Pulmonic valve: There was no regurgitation. - Pulmonary arteries: Systolic pressure was mildly  increased. PA   peak pressure: 39 mm Hg (S). - Inferior vena cava: The vessel was normal in size.  Patient Profile     Victoria Burke is a 66 y.o. female who is being seen today for the evaluation of atrial fibrillation with rapid ventricular response at the request of Dr. Ashok Cordia. The patient has a past medical history significant for breast CA s/p radiation in 2013, COPD,GERD, h/o gastric ulcer, cocaine use,HTN and HLD.  She has been feeling weak and tired for a couple of weeks or more. She was recently incarcerated for about a month being released last Thursday. She was given  her meds in the facility and she reports compliance with her meds at home although she is not able to tell me what she takes. She reports her last use of cocaine was last week after she was released. She continues to smoke about 1/3 PPD. She reports that she drinks one beer occasionally, not more than 1 or 2 a week or less. Today she noted chest pain on deep breathing. She has no exertional type chest discomfort. She has chronic shortness of breath with just walking around the house which is not worse recently. She has occasional palpitations a few times a week. She has no orthhopnea, swelling, PND. No respiratory symptoms of cough, congestion, wheezing.    Assessment & Plan    Atrial fibrillation with rapid ventricular response -Pt has had MAT/afib during recent hospitalizations in January and February and out patient follow up had been arranged, however, she did not keep the appointments. Pt has had recent stressors related to being incarcerated for about a month.  -Was on Xarelto at one point but that was stopped on last hospitalization due to concern about compliance -currently on Cardizem CD 240mg  daily and back in NSR. -Avoid BB use due to cocaine use -CHA2DS2/VAS Stroke Risk Score is 4 (HTN, Age , DM, female). Pt is not a good candidate for coumadin due to non compliance and substance abuse. After discussion with  colleagues, I feel her risk going forward with anticoagulation out weighs the benefits.  She has polysubstance abuse and if she became intoxicated with either drugs or alcohol and fell her risk of ICH would be high.  Therefore do not recommend longterm anticoagulation.  She understands the risks involved with this.  If, moving forward, she changes her lifestyle and stops drug use, then could consider anticoagulation. -Will stop Xarelto.  chest pain  -with deep breathing, non-exertional, tachycadia and mildly elevated troponin. CTA chest negative for PE but did show some pleural fluid. -Troponin 0.11>0.31>0.21>0.14, mildly elevated consistent with demand ischemia in setting of tachycardia and CHF.  - CP is pleuritic and not c/w ACS.  2D echo with normal LVF in January. - no further ischemic workup at this time - she does have coronary artery calcifications on chest CT - given her noncompliance with followup will get a coronary CTA today to rule out significant CAD.    Cocaine use -Pt reports last use was late last week -encouraged to quit  Acute on chronic diastolic dysfunction - LV EF was 65-70% in 07/2016 - mild interstitial edema and pleural effusions on CTA and BNP elevated at 651 - likely related to afib with RVR.   - Currently on Lasix 40mg  IV daily - will change back to 40mg  PO which is home dose - I&O's net neg 534cc.   - follow renal function and potassium  Hypertension -Continue diltiazem 240 mg daily -increased Hydralazine yesterday to 25mg  TID and BP much improved. -no BB due to cocaine use   Chronic renal insufficiency -SCr 1.12 (baseline 1.03-1.45) -follow closely while diuresing  Hypokalemia -K+ 3.2 yetserday and increased Kdur to 17meq BID -K+ 4.4 today - will cut back to 17meq daily as we are changing back to PO lasix  Tobacco use -1/3 PPD. Advise cessation.  No further recs at this time.  If coronary CTA with no obstructive CAD then will sign off.  Call with  any questions.  Followup with Dr. Marlou Porch  Signed, Fransico Him, MD  10/17/2016, 8:39 AM

## 2016-10-17 NOTE — Progress Notes (Signed)
Pt going for Cardiac CT. Metoprolol 50mg  Po was ordered. This was questioned with the ordering MD as per Dr. Radford Pax notes pt not to get any Beta Blocker due to her history. Ordering MD stated he wanted it to be given. Dr. Theodosia Blender PA has also been made aware of this. Hal Neer RN BSN MSN 10/17/2016 @10 .33am

## 2016-10-18 MED ORDER — HYDRALAZINE HCL 25 MG PO TABS: 25.0000 mg | ORAL_TABLET | Freq: Three times a day (TID) | ORAL | 0 refills | Status: DC

## 2016-10-18 MED ORDER — DILTIAZEM HCL ER COATED BEADS 240 MG PO CP24: 240.0000 mg | ORAL_CAPSULE | Freq: Every day | ORAL | 0 refills | Status: DC

## 2016-10-18 NOTE — Discharge Summary (Signed)
Physician Discharge Summary  Victoria Burke ZOX:[id number] DOB: 1951-06-12 DOA: 10/15/2016  PCP: Dorena Dew, FNP  Admit date: 10/15/2016 Discharge date: 10/18/2016  Admitted From: home Disposition:  home  Recommendations for Outpatient Follow-up:  1. Follow up with PCP in 1-2 weeks 2. Follow up with Dr Marlou Porch in 2 weeks.  3. Counseled to refrain from using Illicit drugs, alcohol and tobacco 4. Cardizem and Hydralazine dose increased.   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 She was found to be atrial fibrillation likely secondary to medical noncompliance and drug use. She was also recently admitted here in February 2018 for acute on chronic respiratory failure from CHF exacerbation. During her stay she was started on Cardizem drip at first later switched to oral Cardizem which was increased to 20 and 40 mg daily. Beta blocker use was avoided due to history of cocaine use. Cardiology was consulted who was following along. Her dose of hydralazine was increased to 25 mg 3 times a day due to elevated blood pressure which could be secondary to drug use as well. Her urine drug screen was positive for cocaine and marijuana. Coronary CTA was done which showed less than 30% calcified disease in proximal and mid RCA along with LAD with calcium score of 142. It was determined she was stable and okay to follow-up outpatient with Dr. Luther Parody. At this time she is not a good candidate for anticoagulation given her active drug use and increase fall risk. We have counseled her and  advised her once she stops using drugs we will consider starting her on anticoagulation. Today she has reached maximum benefit from in hospital stay and is stable to be discharged with outpatient follow-up with recommendations as mentioned above.  Discharge Diagnoses:  Principal Problem:   Atrial fibrillation with RVR (HCC) Active Problems:   Hypokalemia   Tobacco abuse   Cocaine abuse   Chronic obstructive pulmonary disease (HCC)   Essential hypertension   Generalized anxiety disorder   Chest pain in adult   Chest pain  Atrial fibrillation with RVR, resolved  - missed outpatient follow-up with cardiology - CHADS2vasc is 4  -Continue Cardizem 240 mg daily. One beta blocker use due to cocaine use -She's not a good candidate for anticoagulation given high risk from her polysubstance use and medical noncompliance. She understands the risk and I have counseled her against illicit drug use. -Can follow-up outpatient with cardiology and if her drug abuse behavior changes at that point she can be considered for anticoagulation  Chest painwith elevated troponin; resolved  suspect due to recent cocaine use - CTa chest negative for PE - pain pleuritic in nature  -Cardiac CT done. Results as stated above.   Hypokalemia & Hypopomagnesemia; resolved  Replace to goal of K >4 and Mg >2  Chronic diastolic CHF -restart home lasix 40mg  daily  Ongoing cocaine and tobacco abuse Counseled strongly on cessation of both   Uncontrolled hypertension; improved at the time of discharge.  BP now well controlled on home med regimen, suggesting noncompliance as outpt   Chronic kidney disease stage II crt stable at baseline   COPD Well compensated at this  time   Hyperglycemia  Elevated blood glucose - A1c 07/2015 was 6.2 - repeat this admit 6.0 - not yet true DM   History of breast cancer in remission Continue anastrozole  Discharge Instructions   Allergies as of 10/18/2016   No Known  Allergies     Medication List    TAKE these medications   albuterol 108 (90 Base) MCG/ACT inhaler Commonly known as:  PROVENTIL HFA;VENTOLIN HFA Inhale 1-2 puffs into the lungs every 6 (six) hours as needed for wheezing or shortness of breath.   anastrozole 1 MG tablet Commonly known as:  ARIMIDEX TAKE 1 TABLET BY MOUTH EVERY DAY   aspirin 81 MG EC tablet Take 1 tablet (81 mg total) by mouth daily.   busPIRone 10 MG tablet Commonly known as:  BUSPAR Take 1 tablet (10 mg total) by mouth 2 (two) times daily.   diltiazem 240 MG 24 hr capsule Commonly known as:  CARDIZEM CD Take 1 capsule (240 mg total) by mouth daily. Start taking on:  10/19/2016 What changed:  medication strength  how much to take   ergocalciferol 50000 units capsule Commonly known as:  DRISDOL Take 1 capsule (50,000 Units total) by mouth once a week.   furosemide 40 MG tablet Commonly known as:  LASIX Take 1 tablet (40 mg total) by mouth daily.   gabapentin 300 MG capsule Commonly known as:  NEURONTIN Take 1 capsule (300 mg total) by mouth 3 (three) times daily.   hydrALAZINE 25 MG tablet Commonly known as:  APRESOLINE Take 1 tablet (25 mg total) by mouth 3 (three) times daily. What changed:  medication strength  how much to take   nicotine 14 mg/24hr patch Commonly known as:  NICODERM CQ Place 1 patch (14 mg total) onto the skin daily.   potassium chloride SA 20 MEQ tablet Commonly known as:  K-DUR,KLOR-CON Take 1 tablet (20 mEq total) by mouth daily.   saccharomyces boulardii 250 MG capsule Commonly known as:  FLORASTOR Take 1 capsule (250 mg total) by mouth 2 (two) times daily.      Follow-up Information    Hollis,Lachina M, FNP Follow up in 1 week(s).   Specialty:  Family Medicine Contact information: [address]. [place] [address] [phone]        Mark Skains, MD Follow up in 2 week(s).   Specialty:  Cardiology Contact information: [address] [place] [address] [phone]          No Known Allergies  Consultations:  Cardiology.    Procedures/Studies: Ct Angio Chest Pe W Or Wo Contrast  Result Date: 10/15/2016 CLINICAL DATA:  Chest pain, dizziness and tachycardia over the last week. EXAM: CT ANGIOGRAPHY CHEST WITH CONTRAST TECHNIQUE: Multidetector CT imaging of the chest was performed using the standard protocol during bolus administration of intravenous contrast. Multiplanar CT image reconstructions and MIPs were obtained to evaluate the vascular anatomy. CONTRAST:  100 cc Isovue 370 COMPARISON:  Radiography 08/28/2016.  CT 08/27/2016. FINDINGS: Cardiovascular: Pulmonary arterial opacification is excellent. There are no pulmonary emboli. There is aortic atherosclerosis. Coronary artery calcification is present. The heart is at the upper limits of normal in size. Small amount of pericardial fluid. Mediastinum/Nodes: No hilar or mediastinal mass. Lungs/Pleura: Moderate size pleural effusion on the right layering dependently with dependent pulmonary atelectasis. No pleural fluid on the left. Emphysema with peripheral blebs. There may be very mild interstitial edema, but no advanced edema. The lungs are otherwise  clear besides the dependent atelectasis. Upper Abdomen: Negative Musculoskeletal: Several minimal superior endplate deformities, unchanged from the previous study. No acute finding suspected. Review of the MIP images confirms the above findings. IMPRESSION: No pulmonary emboli. Moderate right effusion layering dependently with dependent pulmonary atelectasis. Underlying emphysema. Question mild interstitial edema. Electronically Signed   By: Nelson Chimes M.D.   On: 10/15/2016 17:40   Ct Coronary Morph W/cta Cor W/score W/ca W/cm &/or Wo/cm  Addendum Date: 10/17/2016   ADDENDUM REPORT: 10/17/2016 12:47 CLINICAL DATA:  Chest pain EXAM: Cardiac CTA MEDICATIONS: Sub lingual nitro. 4mg  and lopressor 10mg  iv and  50 mg PO TECHNIQUE: The patient was scanned on a Siemens 948 slice scanner. Gantry rotation speed was 270 msecs. Collimation was .42mm. A 100 kV prospective scan was triggered in the ascending thoracic aorta at 111 HU's with 5% padding centered around 78% of the R-R interval. Average HR during the scan was 68 bpm. The patient had significant ectopy both PAC;s and PVC;s The 3D data set was interpreted on a dedicated work station using MPR, MIP and VRT modes. A total of 80cc of contrast was used. FINDINGS: Non-cardiac: See separate report from [facility]. Bilateral pleural effusions with aortic atherosclerosis and possible ILD Calcium Score:  Calcium detected in proximal/mid RCA and LAD Coronary Arteries: Right dominant with no anomalies LM:  Normal LAD:  Less than 30% calcified stenosis in proximal and mid LAD D1:  Normal D2:  Normal Circumflex:  Normal OM1:  Normal OM2:  Normal RCA:  Less than 30% calcified disease in proximal and mid RCA PDA:  Normal PLA:  Normal IMPRESSION: 1) Ectopy degraded study but diagnostic 2) Less than 30% calcified disease in proximal/mid RCA and LAD 3) Calcium score 142 88th percentile for age and sex matched controls 4) Moderate aortic atherosclerosis 5) Bilateral pleural effusions 6) Possible ILD see comments from radiology [place] Electronically Signed   By: Jenkins Rouge M.D.   On: 10/17/2016 12:47   Result Date: 10/17/2016 EXAM: OVER-READ INTERPRETATION  CT CHEST The following report is an over-read performed by radiologist Dr. Rebekah Chesterfield [facility] on 10/17/2016. This over-read does not include interpretation of cardiac or coronary anatomy or pathology. The coronary calcium score/coronary CTA interpretation by the cardiologist is attached. COMPARISON:  Chest CT 08/27/2016. FINDINGS: Within the visualized portions of the thorax there are no suspicious appearing pulmonary nodules or masses. Throughout the visualized lungs there is a background of  mild diffuse ground-glass attenuation and patchy areas of septal thickening, including some areas of subpleural reticulation (most evident in the subpleural aspect of the left upper lobe anterolaterally). No pneumothorax. Moderate right-sided pleural effusion lying dependently with associated passive subsegmental atelectasis in the right lower lobe. Additional areas of subsegmental atelectasis and/or scarring in the left lower lobe with trace left pleural effusion. No lymphadenopathy noted in the visualized portions of the thorax. Aortic atherosclerosis. There are no aggressive appearing lytic or blastic lesions noted in the visualized portions of the skeleton. IMPRESSION: 1. Moderate right pleural effusion and trace left pleural effusion. 2. Atelectasis and/or scarring in the basal segments of the left lower lobe. Dependent subsegmental atelectasis in the right lower lobe. 3. The appearance of the lungs is concerning for potential interstitial lung disease. Further evaluation with nonemergent high-resolution chest CT is suggested in the near future to better evaluate these findings, preferably after resolution of the patient's acute illness. 4. Aortic atherosclerosis. Electronically Signed: By: Vinnie Langton M.D. On: 10/17/2016 12:27  Subjective:   Discharge Exam: Vitals:   10/18/16 0712 10/18/16 0900  BP:  (!) 151/90  Pulse:    Resp:    Temp: 98.2 F (36.8 C)    Vitals:   10/18/16 0020 10/18/16 0443 10/18/16 0712 10/18/16 0900  BP: (!) 141/97 (!) 159/95  (!) 151/90  Pulse: 67 77    Resp: 18 (!) 24    Temp: 98.2 F (36.8 C) 98.1 F (36.7 C) 98.2 F (36.8 C)   TempSrc: Oral Oral Oral   SpO2: 96% 97%    Weight:  52.7 kg (116 lb 3.2 oz)    Height:        General: Pt is alert, awake, not in acute distress Cardiovascular: RRR, S1/S2 +, no rubs, no gallops Respiratory: CTA bilaterally, no wheezing, no rhonchi Abdominal: Soft, NT, ND, bowel sounds + Extremities: no edema, no  cyanosis    The results of significant diagnostics from this hospitalization (including imaging, microbiology, ancillary and laboratory) are listed below for reference.     Microbiology: Recent Results (from the past 240 hour(s))  MRSA PCR Screening     Status: None   Collection Time: 10/15/16  6:38 PM  Result Value Ref Range Status   MRSA by PCR NEGATIVE NEGATIVE Final    Comment:        The GeneXpert MRSA Assay (FDA approved for NASAL specimens only), is one component of a comprehensive MRSA colonization surveillance program. It is not intended to diagnose MRSA infection nor to guide or monitor treatment for MRSA infections.      Labs: BNP (last 3 results)  Recent Labs  07/28/16 2038 08/26/16 1101 10/15/16 1815  BNP 1,460.6* 843.9* 950.9*   Basic Metabolic Panel:  Recent Labs Lab 10/15/16 1116 10/15/16 1815 10/16/16 0614 10/17/16 0209  NA 136  --  136 136  K 3.2*  --  3.2* 4.4  CL 106  --  105 109  CO2 21*  --  22 21*  GLUCOSE 242*  --  216* 105*  BUN 12  --  21* 20  CREATININE 1.17*  --  1.14* 1.12*  CALCIUM 8.5*  --  8.4* 8.5*  MG  --  1.6*  --  1.9   Liver Function Tests: No results for input(s): AST, ALT, ALKPHOS, BILITOT, PROT, ALBUMIN in the last 168 hours. No results for input(s): LIPASE, AMYLASE in the last 168 hours. No results for input(s): AMMONIA in the last 168 hours. CBC:  Recent Labs Lab 10/15/16 1116 10/16/16 0614 10/17/16 0209  WBC 6.3 6.4 6.3  HGB 13.8 12.3 12.1  HCT 41.5 38.2 37.8  MCV 88.1 88.4 88.5  PLT 349 298 326   Cardiac Enzymes:  Recent Labs Lab 10/15/16 1116 10/15/16 1815 10/15/16 2258 10/16/16 0614  TROPONINI 0.11* 0.31* 0.21* 0.14*   BNP: Invalid input(s): POCBNP CBG:  Recent Labs Lab 10/15/16 1248  GLUCAP 170*   D-Dimer No results for input(s): DDIMER in the last 72 hours. Hgb A1c  Recent Labs  10/15/16 1116  HGBA1C 6.0*   Lipid Profile No results for input(s): CHOL, HDL, LDLCALC, TRIG,  CHOLHDL, LDLDIRECT in the last 72 hours. Thyroid function studies No results for input(s): TSH, T4TOTAL, T3FREE, THYROIDAB in the last 72 hours.  Invalid input(s): FREET3 Anemia work up No results for input(s): VITAMINB12, FOLATE, FERRITIN, TIBC, IRON, RETICCTPCT in the last 72 hours. Urinalysis    Component Value Date/Time   COLORURINE YELLOW 10/15/2016 2143   APPEARANCEUR CLEAR 10/15/2016 2143   LABSPEC >  1.046 (H) 10/15/2016 2143   PHURINE 5.0 10/15/2016 2143   GLUCOSEU 50 (A) 10/15/2016 2143   HGBUR NEGATIVE 10/15/2016 2143   BILIRUBINUR NEGATIVE 10/15/2016 2143   KETONESUR NEGATIVE 10/15/2016 2143   PROTEINUR >=300 (A) 10/15/2016 2143   UROBILINOGEN 0.2 06/25/2016 1507   NITRITE NEGATIVE 10/15/2016 2143   LEUKOCYTESUR NEGATIVE 10/15/2016 2143   Sepsis Labs Invalid input(s): PROCALCITONIN,  WBC,  LACTICIDVEN Microbiology Recent Results (from the past 240 hour(s))  MRSA PCR Screening     Status: None   Collection Time: 10/15/16  6:38 PM  Result Value Ref Range Status   MRSA by PCR NEGATIVE NEGATIVE Final    Comment:        The GeneXpert MRSA Assay (FDA approved for NASAL specimens only), is one component of a comprehensive MRSA colonization surveillance program. It is not intended to diagnose MRSA infection nor to guide or monitor treatment for MRSA infections.      Time coordinating discharge: Over 30 minutes  SIGNED:   Damita Lack, MD  [facility] 10/18/2016, 10:58 AM Pager   If 7PM-7AM, please contact night-coverage [email or web address] Password [email or web address]

## 2016-10-18 NOTE — Progress Notes (Signed)
Coronary CTA showed less than 30% calcified disease in proximal/mid RCA and LAD with calcium score 142 which is 88th percentile for age and sex matched controls.  There was Moderate aortic atherosclerosis, Bilateral pleural effusions and Possible ILD see comments from radiology.  Noncardiac CP.  Will sign off.  No further recs at this time. Please have patient followup with Dr. Marlou Porch.

## 2016-10-21 ENCOUNTER — Telehealth: Payer: Self-pay

## 2016-10-21 DIAGNOSIS — B1920 Unspecified viral hepatitis C without hepatic coma: Secondary | ICD-10-CM

## 2016-10-21 NOTE — Telephone Encounter (Signed)
I spoke with the patient and confirmed that Victoria Burke is her PCP.  She stated that she saw her last in Jan 2018 and has an appointment next week. Duane Lope ([place])

## 2016-10-31 ENCOUNTER — Ambulatory Visit: Payer: Medicare HMO | Admitting: Family Medicine

## 2016-10-31 ENCOUNTER — Telehealth: Payer: Self-pay | Admitting: Hematology

## 2016-10-31 ENCOUNTER — Encounter: Payer: Self-pay | Admitting: Family Medicine

## 2016-10-31 ENCOUNTER — Telehealth: Payer: Self-pay | Admitting: *Deleted

## 2016-10-31 ENCOUNTER — Ambulatory Visit (INDEPENDENT_AMBULATORY_CARE_PROVIDER_SITE_OTHER): Payer: Medicare Other | Admitting: Family Medicine

## 2016-10-31 VITALS — BP 168/92 | HR 72 | Temp 97.7°F | Resp 18 | Ht 62.0 in | Wt 114.0 lb

## 2016-10-31 DIAGNOSIS — E559 Vitamin D deficiency, unspecified: Secondary | ICD-10-CM | POA: Diagnosis not present

## 2016-10-31 DIAGNOSIS — R7303 Prediabetes: Secondary | ICD-10-CM | POA: Diagnosis not present

## 2016-10-31 DIAGNOSIS — I10 Essential (primary) hypertension: Secondary | ICD-10-CM | POA: Diagnosis not present

## 2016-10-31 DIAGNOSIS — F329 Major depressive disorder, single episode, unspecified: Secondary | ICD-10-CM | POA: Diagnosis not present

## 2016-10-31 DIAGNOSIS — F411 Generalized anxiety disorder: Secondary | ICD-10-CM | POA: Diagnosis not present

## 2016-10-31 DIAGNOSIS — F1410 Cocaine abuse, uncomplicated: Secondary | ICD-10-CM | POA: Diagnosis not present

## 2016-10-31 DIAGNOSIS — Z720 Tobacco use: Secondary | ICD-10-CM | POA: Diagnosis not present

## 2016-10-31 DIAGNOSIS — Z23 Encounter for immunization: Secondary | ICD-10-CM

## 2016-10-31 DIAGNOSIS — I4891 Unspecified atrial fibrillation: Secondary | ICD-10-CM

## 2016-10-31 DIAGNOSIS — Z1159 Encounter for screening for other viral diseases: Secondary | ICD-10-CM

## 2016-10-31 DIAGNOSIS — I5043 Acute on chronic combined systolic (congestive) and diastolic (congestive) heart failure: Secondary | ICD-10-CM | POA: Diagnosis not present

## 2016-10-31 DIAGNOSIS — F32A Depression, unspecified: Secondary | ICD-10-CM

## 2016-10-31 DIAGNOSIS — G4700 Insomnia, unspecified: Secondary | ICD-10-CM

## 2016-10-31 LAB — BASIC METABOLIC PANEL
BUN: 23 mg/dL (ref 7–25)
CO2: 21 mmol/L (ref 20–31)
Calcium: 8.8 mg/dL (ref 8.6–10.4)
Chloride: 107 mmol/L (ref 98–110)
Creat: 1.24 mg/dL — ABNORMAL HIGH (ref 0.50–0.99)
Glucose, Bld: 86 mg/dL (ref 65–99)
POTASSIUM: 4.3 mmol/L (ref 3.5–5.3)
SODIUM: 140 mmol/L (ref 135–146)

## 2016-10-31 LAB — POCT URINALYSIS DIP (DEVICE)
Bilirubin Urine: NEGATIVE
GLUCOSE, UA: NEGATIVE mg/dL
Hgb urine dipstick: NEGATIVE
KETONES UR: NEGATIVE mg/dL
LEUKOCYTES UA: NEGATIVE
Nitrite: NEGATIVE
Protein, ur: >=300 mg/dL — AB
SPECIFIC GRAVITY, URINE: 1.025 (ref 1.005–1.030)
Urobilinogen, UA: 0.2 mg/dL (ref 0.0–1.0)
pH: 6 (ref 5.0–8.0)

## 2016-10-31 LAB — HEPATITIS C ANTIBODY: HCV AB: REACTIVE — AB

## 2016-10-31 MED ORDER — TRAZODONE HCL 50 MG PO TABS: 25.0000 mg | ORAL_TABLET | Freq: Every evening | ORAL | 0 refills | Status: DC | PRN

## 2016-10-31 NOTE — Telephone Encounter (Signed)
Confirmed 5/1 appt at 10 per LOS

## 2016-10-31 NOTE — Telephone Encounter (Signed)
Spoke with Mickel Baas, RN for Cammie Sickle, NP from [facility].  Asked Mickel Baas to fax any recent medical records, including recent mammogram and/or biopsy results to Dr. Ernestina Penna office for review.  Linton Flemings direct fax number to nurses desk. Schedule message sent.
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
## 2016-10-31 NOTE — Patient Instructions (Addendum)
Cardilogy: Continue cardiazem  [facility]-May 10th at 10 am. Please be aware of 10 minute grace period  Oncology:  Left breast cancer-Patient has been lost to follow up. [place]. Will call back to schedule an appointment   Insomnia:   Will start a trial of Trazadone 25 mg at bedtime. Will refrain from drinking alcohol, illicit drugs, or operating machinery while taking medication.  Practice good sleep hygiene.  Stick to a sleep schedule, even on weekends. Exercise is great, but not too late in the day Avoid alcoholic drinks before bed Avoid large meals and beverages late before bed Don't take naps after 3 pm. Keep power naps less than 1 hour.  Relax before bed.  Take a hot bath before bed.  Have a good sleeping environment. Get rid of anything in your bedroom that might distract you from sleep.  Adopt good sleeping posture.

## 2016-10-31 NOTE — Telephone Encounter (Signed)
-----   Message from Truitt Merle, MD sent at 10/31/2016  1:41 PM EDT ----- Meredeth Ide,  Please call [place] and get her recent medical records, I do not see any recent mammogram or biopsy results in epic  Please schedule her to see me next Tuesday as 30 mins f/u  Thanks  Krista Blue  ----- Message ----- From: Lorin Picket Sent: 10/31/2016  10:26 AM To: Arlice Colt Pod [address]  [place] [facility] called and wanted this patient to be seen again for breast cancer.  I was not sure if she needed a referal because she has not been here since 2016.  Smith Robert said she would be happy to send a referral and her call back number is [phone]

## 2016-11-01 ENCOUNTER — Other Ambulatory Visit: Payer: Self-pay | Admitting: Family Medicine

## 2016-11-01 LAB — VITAMIN D 25 HYDROXY (VIT D DEFICIENCY, FRACTURES): Vit D, 25-Hydroxy: 24 ng/mL — ABNORMAL LOW (ref 30–100)

## 2016-11-02 IMAGING — CR DG CHEST 2V
2 series · 2 of 2 positions shown · non-contrast
Comparison: 12/22/2012

CLINICAL DATA: Progressive shortness of breath.  Hypertension.

EXAM:
CHEST  2 VIEW

[w chest pa]
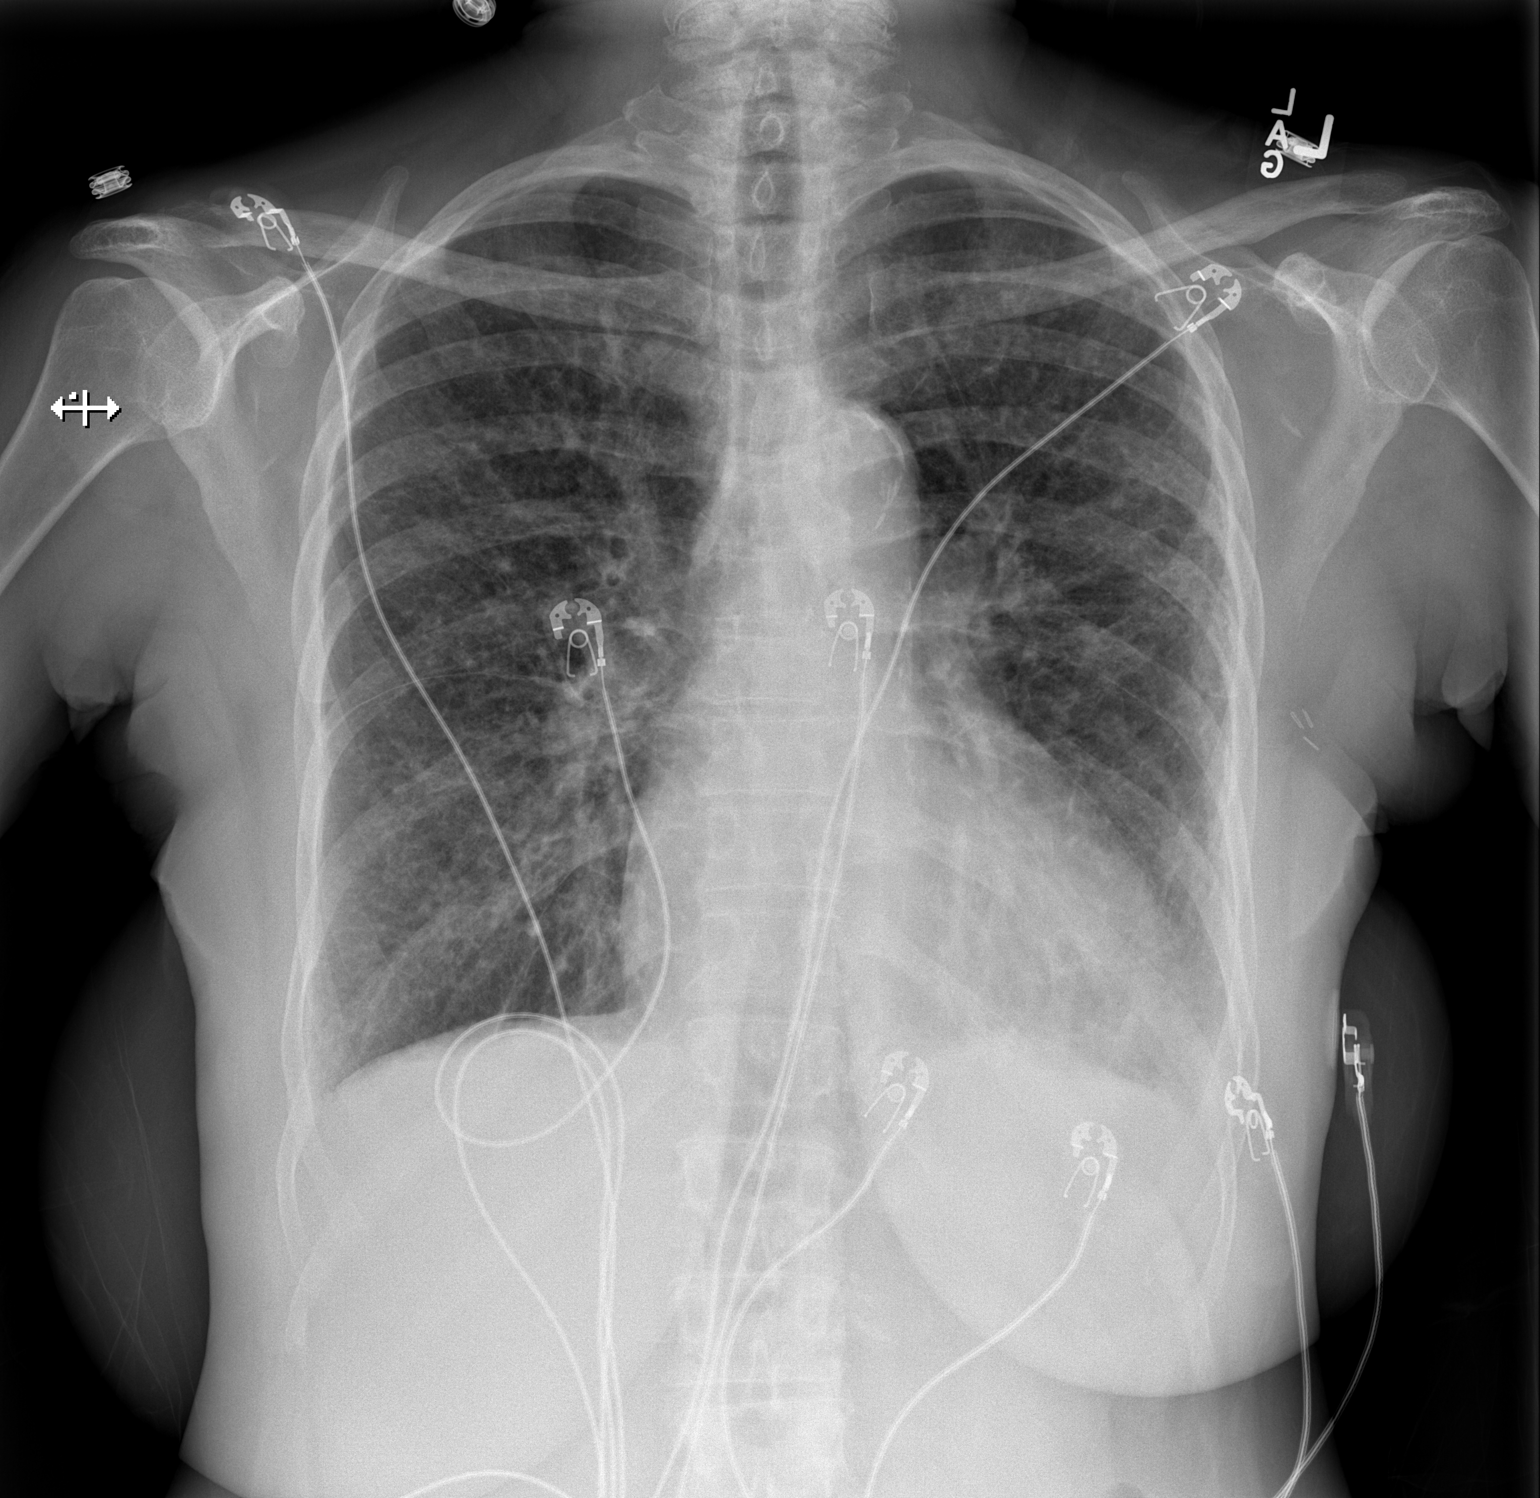

[w chest lat]
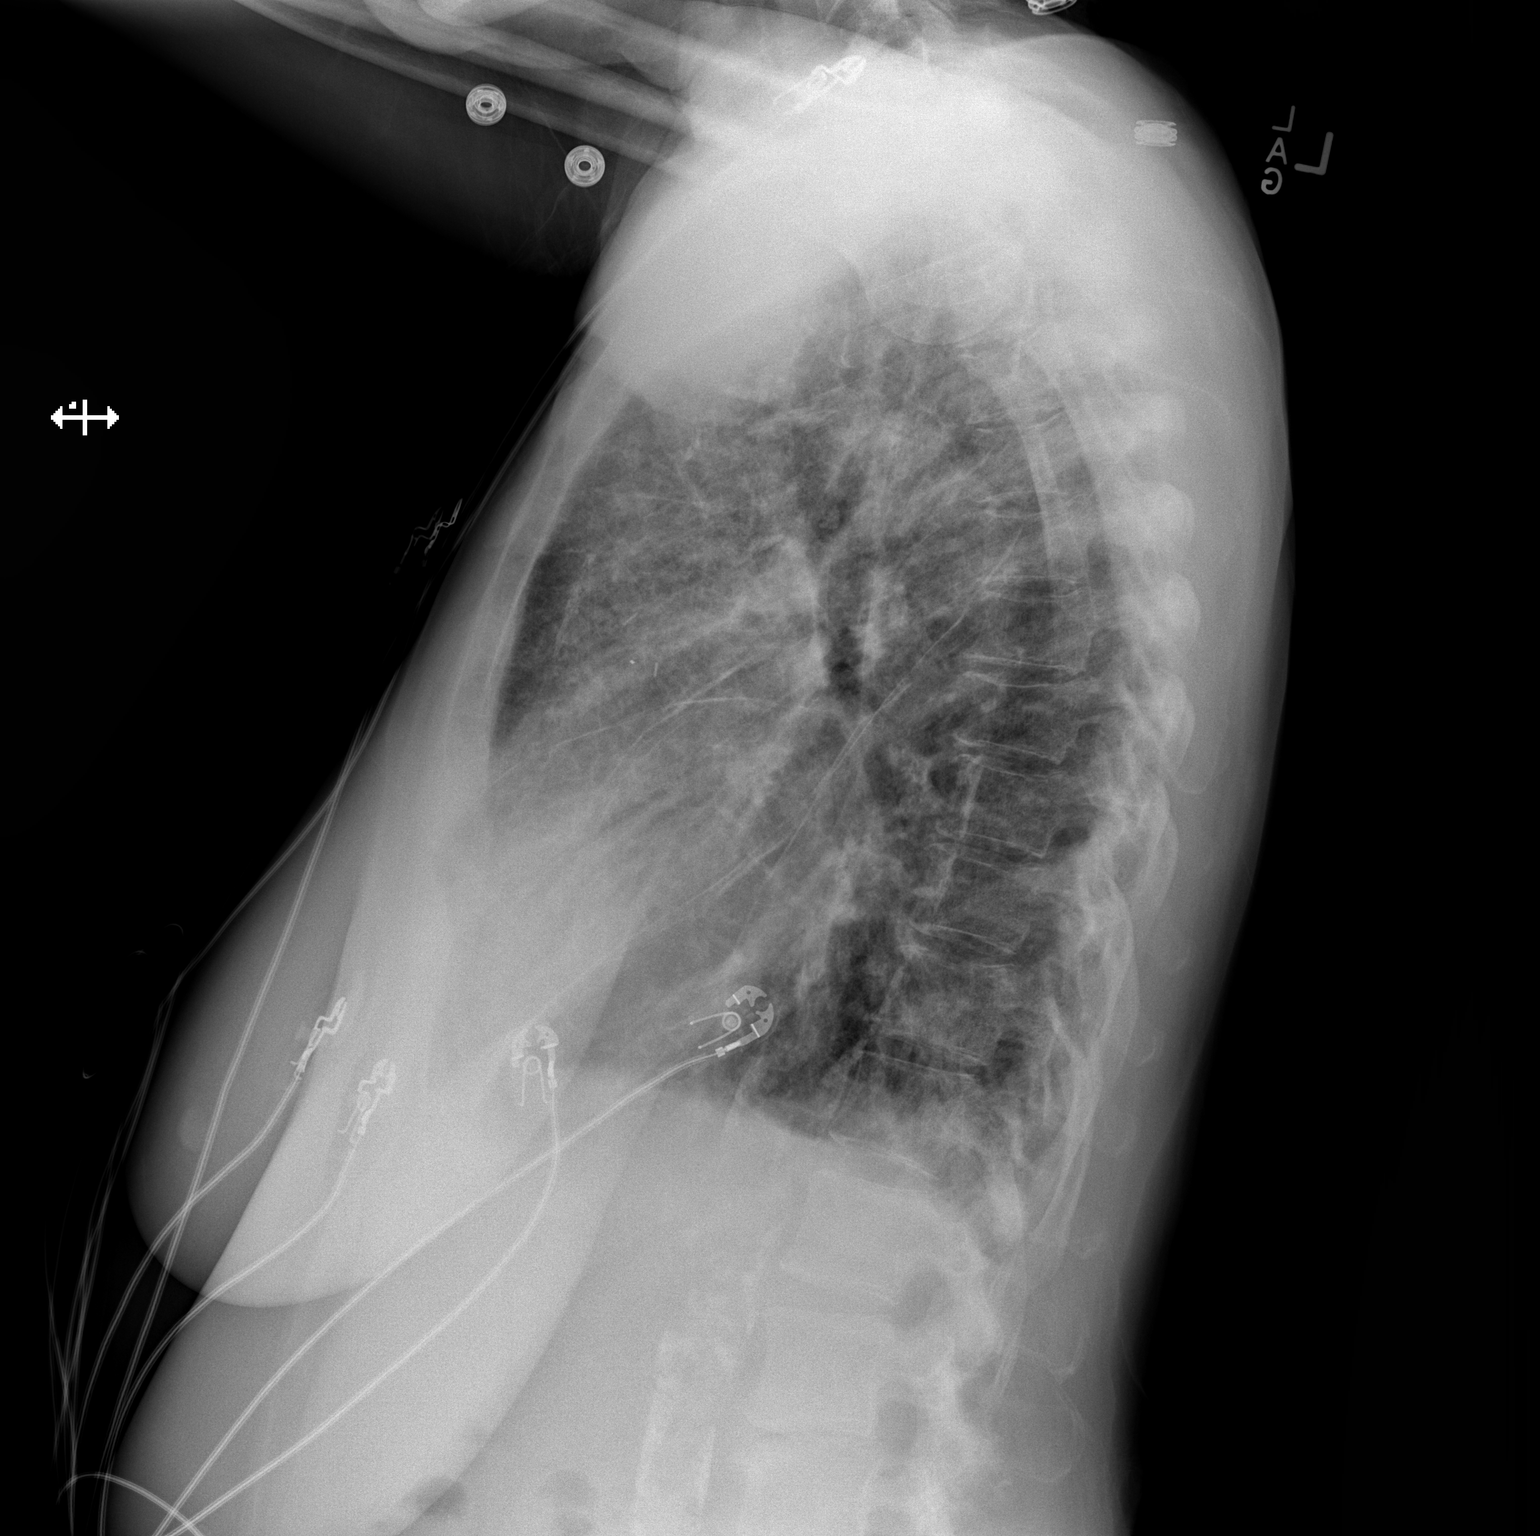

[2 of 2 positions shown; findings below may reference images not displayed]

FINDINGS: Mild cardiomegaly is unchanged. Diffuse reticular opacities most
consistent with pulmonary edema, new from prior exam. Small left
pleural effusion. No confluent airspace disease. No pneumothorax. No
acute osseous abnormalities. Diffuse atherosclerosis of the aorta.
IMPRESSION: Pulmonary edema with cardiomegaly and small left pleural effusion.
Findings most consistent with CHF.

## 2016-11-04 LAB — HEPATITIS C RNA QUANTITATIVE
HCV QUANT: 324000 [IU]/mL — AB
HCV Quantitative Log: 5.51 Log IU/mL — ABNORMAL HIGH

## 2016-11-04 NOTE — Telephone Encounter (Signed)
[name], a 66 year old female with a history of HTN, CHF, left breast CA, and polysubstance abuse presented on 10/31/2016 for a follow up. Patient was routinely screened for hepatitis C with reflex. Patient had positive antibodies and hepatitis was confirmed by quantitative PCR. Will send a referral to infectious disease for further work up and evaluation.    Donia Pounds  MSN, FNP-C [facility] [address] [place], [place] [address] [phone]

## 2016-11-05 ENCOUNTER — Ambulatory Visit (HOSPITAL_BASED_OUTPATIENT_CLINIC_OR_DEPARTMENT_OTHER): Payer: Medicare Other | Admitting: Hematology

## 2016-11-05 ENCOUNTER — Encounter: Payer: Self-pay | Admitting: Hematology

## 2016-11-05 ENCOUNTER — Other Ambulatory Visit: Payer: Self-pay | Admitting: Hematology

## 2016-11-05 ENCOUNTER — Telehealth: Payer: Self-pay | Admitting: Oncology

## 2016-11-05 ENCOUNTER — Ambulatory Visit (HOSPITAL_BASED_OUTPATIENT_CLINIC_OR_DEPARTMENT_OTHER): Payer: Medicare Other

## 2016-11-05 VITALS — BP 139/87 | HR 81 | Temp 97.8°F | Resp 16 | Ht 62.0 in | Wt 112.1 lb

## 2016-11-05 DIAGNOSIS — I4891 Unspecified atrial fibrillation: Secondary | ICD-10-CM | POA: Diagnosis not present

## 2016-11-05 DIAGNOSIS — C50912 Malignant neoplasm of unspecified site of left female breast: Secondary | ICD-10-CM | POA: Diagnosis not present

## 2016-11-05 DIAGNOSIS — B1920 Unspecified viral hepatitis C without hepatic coma: Secondary | ICD-10-CM

## 2016-11-05 DIAGNOSIS — C50412 Malignant neoplasm of upper-outer quadrant of left female breast: Secondary | ICD-10-CM

## 2016-11-05 DIAGNOSIS — I509 Heart failure, unspecified: Secondary | ICD-10-CM

## 2016-11-05 DIAGNOSIS — Z853 Personal history of malignant neoplasm of breast: Secondary | ICD-10-CM

## 2016-11-05 DIAGNOSIS — Z170 Estrogen receptor positive status [ER+]: Secondary | ICD-10-CM | POA: Diagnosis not present

## 2016-11-05 DIAGNOSIS — I10 Essential (primary) hypertension: Secondary | ICD-10-CM | POA: Diagnosis not present

## 2016-11-05 DIAGNOSIS — E2839 Other primary ovarian failure: Secondary | ICD-10-CM

## 2016-11-05 MED ORDER — ANASTROZOLE 1 MG PO TABS: 1.0000 mg | ORAL_TABLET | Freq: Every day | ORAL | 5 refills | Status: DC
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
## 2016-11-05 NOTE — Telephone Encounter (Signed)
Gave patient avs report and appointments for July and mammo/bone density at [facility] 5/15.

## 2016-11-06 LAB — HEPATIC FUNCTION PANEL
ALBUMIN: 2.8 g/dL — AB (ref 3.6–4.8)
ALT: 28 IU/L (ref 0–32)
AST (SGOT): 44 IU/L — ABNORMAL HIGH (ref 0–40)
Alkaline Phosphatase, S: 117 IU/L (ref 39–117)
BILIRUBIN, DIRECT: 0.07 mg/dL (ref 0.00–0.40)
TOTAL PROTEIN: 7.5 g/dL (ref 6.0–8.5)

## 2016-11-14 ENCOUNTER — Ambulatory Visit (INDEPENDENT_AMBULATORY_CARE_PROVIDER_SITE_OTHER): Payer: Medicare Other | Admitting: Cardiology

## 2016-11-14 ENCOUNTER — Encounter: Payer: Self-pay | Admitting: Cardiology

## 2016-11-14 VITALS — BP 142/88 | HR 58 | Ht 62.0 in | Wt 111.2 lb

## 2016-11-14 DIAGNOSIS — I480 Paroxysmal atrial fibrillation: Secondary | ICD-10-CM | POA: Diagnosis not present

## 2016-11-14 DIAGNOSIS — F1920 Other psychoactive substance dependence, uncomplicated: Secondary | ICD-10-CM | POA: Diagnosis not present

## 2016-11-14 NOTE — Patient Instructions (Signed)
Medication Instructions:  The current medical regimen is effective;  continue present plan and medications.  Follow-Up: Follow up in 6 months with Truitt Merle, NP.  You will receive a letter in the mail 2 months before you are due.  Please call us when you receive this letter to schedule your follow up appointment.  Follow up in 1 year with Dr. Marlou Porch.  You will receive a letter in the mail 2 months before you are due.  Please call us when you receive this letter to schedule your follow up appointment.  Thank you for choosing [facility]!!
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
## 2016-11-19 ENCOUNTER — Other Ambulatory Visit: Payer: Self-pay | Admitting: Family Medicine

## 2016-11-19 ENCOUNTER — Other Ambulatory Visit: Payer: Medicare Other

## 2016-11-19 DIAGNOSIS — E559 Vitamin D deficiency, unspecified: Secondary | ICD-10-CM

## 2016-12-04 ENCOUNTER — Ambulatory Visit: Payer: Medicare Other | Admitting: Family Medicine

## 2016-12-06 ENCOUNTER — Ambulatory Visit: Payer: Medicare Other | Admitting: Family Medicine

## 2016-12-20 ENCOUNTER — Other Ambulatory Visit: Payer: Self-pay | Admitting: Family Medicine

## 2016-12-20 DIAGNOSIS — G4700 Insomnia, unspecified: Secondary | ICD-10-CM

## 2017-01-28 ENCOUNTER — Telehealth: Payer: Self-pay | Admitting: Hematology

## 2017-01-28 NOTE — Telephone Encounter (Signed)
R/s appt per sch message - patient son is aware of appt time and date.

## 2017-01-30 ENCOUNTER — Other Ambulatory Visit: Payer: Self-pay | Admitting: Family Medicine

## 2017-01-30 DIAGNOSIS — F411 Generalized anxiety disorder: Secondary | ICD-10-CM

## 2017-01-30 DIAGNOSIS — I10 Essential (primary) hypertension: Secondary | ICD-10-CM

## 2017-02-03 ENCOUNTER — Encounter (HOSPITAL_COMMUNITY): Payer: Self-pay

## 2017-02-03 ENCOUNTER — Ambulatory Visit: Payer: Medicare Other | Admitting: Hematology

## 2017-02-03 ENCOUNTER — Emergency Department (HOSPITAL_COMMUNITY): Payer: Medicare Other

## 2017-02-03 ENCOUNTER — Other Ambulatory Visit: Payer: Medicare Other

## 2017-02-03 ENCOUNTER — Inpatient Hospital Stay (HOSPITAL_COMMUNITY)
Admission: EM | Admit: 2017-02-03 | Discharge: 2017-02-10 | DRG: 190 | Disposition: A | Discharge status: Home / Self Care | Payer: Medicare Other | Attending: Family Medicine | Admitting: Family Medicine

## 2017-02-03 DIAGNOSIS — Z720 Tobacco use: Secondary | ICD-10-CM | POA: Diagnosis not present

## 2017-02-03 DIAGNOSIS — F329 Major depressive disorder, single episode, unspecified: Secondary | ICD-10-CM | POA: Diagnosis present

## 2017-02-03 DIAGNOSIS — I5032 Chronic diastolic (congestive) heart failure: Secondary | ICD-10-CM

## 2017-02-03 DIAGNOSIS — J810 Acute pulmonary edema: Secondary | ICD-10-CM

## 2017-02-03 DIAGNOSIS — F1410 Cocaine abuse, uncomplicated: Secondary | ICD-10-CM | POA: Diagnosis present

## 2017-02-03 DIAGNOSIS — T380X5A Adverse effect of glucocorticoids and synthetic analogues, initial encounter: Secondary | ICD-10-CM | POA: Diagnosis present

## 2017-02-03 DIAGNOSIS — J90 Pleural effusion, not elsewhere classified: Secondary | ICD-10-CM | POA: Diagnosis not present

## 2017-02-03 DIAGNOSIS — I480 Paroxysmal atrial fibrillation: Secondary | ICD-10-CM | POA: Diagnosis present

## 2017-02-03 DIAGNOSIS — I5033 Acute on chronic diastolic (congestive) heart failure: Secondary | ICD-10-CM | POA: Diagnosis present

## 2017-02-03 DIAGNOSIS — Z853 Personal history of malignant neoplasm of breast: Secondary | ICD-10-CM

## 2017-02-03 DIAGNOSIS — J9601 Acute respiratory failure with hypoxia: Secondary | ICD-10-CM | POA: Diagnosis present

## 2017-02-03 DIAGNOSIS — N3289 Other specified disorders of bladder: Secondary | ICD-10-CM | POA: Diagnosis present

## 2017-02-03 DIAGNOSIS — C50412 Malignant neoplasm of upper-outer quadrant of left female breast: Secondary | ICD-10-CM

## 2017-02-03 DIAGNOSIS — B1920 Unspecified viral hepatitis C without hepatic coma: Secondary | ICD-10-CM | POA: Diagnosis present

## 2017-02-03 DIAGNOSIS — G4700 Insomnia, unspecified: Secondary | ICD-10-CM | POA: Diagnosis present

## 2017-02-03 DIAGNOSIS — J441 Chronic obstructive pulmonary disease with (acute) exacerbation: Secondary | ICD-10-CM | POA: Diagnosis not present

## 2017-02-03 DIAGNOSIS — E785 Hyperlipidemia, unspecified: Secondary | ICD-10-CM | POA: Diagnosis present

## 2017-02-03 DIAGNOSIS — I493 Ventricular premature depolarization: Secondary | ICD-10-CM | POA: Diagnosis present

## 2017-02-03 DIAGNOSIS — G8929 Other chronic pain: Secondary | ICD-10-CM | POA: Diagnosis present

## 2017-02-03 DIAGNOSIS — I472 Ventricular tachycardia: Secondary | ICD-10-CM | POA: Diagnosis present

## 2017-02-03 DIAGNOSIS — I509 Heart failure, unspecified: Secondary | ICD-10-CM

## 2017-02-03 DIAGNOSIS — E876 Hypokalemia: Secondary | ICD-10-CM | POA: Diagnosis present

## 2017-02-03 DIAGNOSIS — Z923 Personal history of irradiation: Secondary | ICD-10-CM

## 2017-02-03 DIAGNOSIS — R05 Cough: Secondary | ICD-10-CM

## 2017-02-03 DIAGNOSIS — N183 Chronic kidney disease, stage 3 (moderate): Secondary | ICD-10-CM | POA: Diagnosis present

## 2017-02-03 DIAGNOSIS — N179 Acute kidney failure, unspecified: Secondary | ICD-10-CM | POA: Diagnosis present

## 2017-02-03 DIAGNOSIS — Z79811 Long term (current) use of aromatase inhibitors: Secondary | ICD-10-CM

## 2017-02-03 DIAGNOSIS — F17210 Nicotine dependence, cigarettes, uncomplicated: Secondary | ICD-10-CM | POA: Diagnosis present

## 2017-02-03 DIAGNOSIS — Z8551 Personal history of malignant neoplasm of bladder: Secondary | ICD-10-CM

## 2017-02-03 DIAGNOSIS — R739 Hyperglycemia, unspecified: Secondary | ICD-10-CM | POA: Diagnosis present

## 2017-02-03 DIAGNOSIS — Z8711 Personal history of peptic ulcer disease: Secondary | ICD-10-CM

## 2017-02-03 DIAGNOSIS — I130 Hypertensive heart and chronic kidney disease with heart failure and stage 1 through stage 4 chronic kidney disease, or unspecified chronic kidney disease: Secondary | ICD-10-CM | POA: Diagnosis present

## 2017-02-03 DIAGNOSIS — K219 Gastro-esophageal reflux disease without esophagitis: Secondary | ICD-10-CM | POA: Diagnosis present

## 2017-02-03 DIAGNOSIS — R059 Cough, unspecified: Secondary | ICD-10-CM

## 2017-02-03 DIAGNOSIS — I4891 Unspecified atrial fibrillation: Secondary | ICD-10-CM

## 2017-02-03 HISTORY — DX: Chronic diastolic (congestive) heart failure: I50.32

## 2017-02-03 LAB — BASIC METABOLIC PANEL
ANION GAP: 10 (ref 5–15)
BUN: 8 mg/dL (ref 6–20)
CHLORIDE: 104 mmol/L (ref 101–111)
CO2: 25 mmol/L (ref 22–32)
Calcium: 9.4 mg/dL (ref 8.9–10.3)
Creatinine, Ser: 1.08 mg/dL — ABNORMAL HIGH (ref 0.44–1.00)
GFR calc Af Amer: >60 mL/min (ref >60)
GFR calc non Af Amer: 53 mL/min — ABNORMAL LOW (ref >60)
GLUCOSE: 108 mg/dL — AB (ref 65–99)
POTASSIUM: 3.8 mmol/L (ref 3.5–5.1)
Sodium: 139 mmol/L (ref 135–145)

## 2017-02-03 LAB — RAPID URINE DRUG SCREEN, HOSP PERFORMED
AMPHETAMINES: NOT DETECTED
BARBITURATES: NOT DETECTED
BENZODIAZEPINES: NOT DETECTED
COCAINE: POSITIVE — AB
Opiates: NOT DETECTED
Tetrahydrocannabinol: NOT DETECTED

## 2017-02-03 LAB — CBC
HEMATOCRIT: 47.3 % — AB (ref 36.0–46.0)
Hemoglobin: 15 g/dL (ref 12.0–15.0)
MCH: 28.1 pg (ref 26.0–34.0)
MCHC: 31.7 g/dL (ref 30.0–36.0)
MCV: 88.6 fL (ref 78.0–100.0)
Platelets: 342 10*3/uL (ref 150–400)
RBC: 5.34 MIL/uL — ABNORMAL HIGH (ref 3.87–5.11)
RDW: 14.7 % (ref 11.5–15.5)
WBC: 3.3 10*3/uL — ABNORMAL LOW (ref 4.0–10.5)

## 2017-02-03 LAB — I-STAT TROPONIN, ED: Troponin i, poc: 0.05 ng/mL (ref 0.00–0.08)

## 2017-02-03 LAB — BRAIN NATRIURETIC PEPTIDE: B NATRIURETIC PEPTIDE 5: 1567.3 pg/mL — AB (ref 0.0–100.0)

## 2017-02-03 LAB — PROCALCITONIN

## 2017-02-03 MED ORDER — LEVOFLOXACIN IN D5W 250 MG/50ML IV SOLN
250.0000 mg | INTRAVENOUS | Status: DC
  Filled 2017-02-03: qty 50

## 2017-02-03 MED ORDER — DILTIAZEM HCL ER COATED BEADS 240 MG PO CP24
240.0000 mg | ORAL_CAPSULE | Freq: Every day | ORAL | Status: DC
  Administered 2017-02-03 – 2017-02-10 (×8): 240 mg via ORAL
  Filled 2017-02-03 (×8): qty 1

## 2017-02-03 MED ORDER — SODIUM CHLORIDE 0.9 % IV BOLUS (SEPSIS)
1000.0000 mL | Freq: Once | INTRAVENOUS | Status: AC
  Administered 2017-02-03: 1000 mL via INTRAVENOUS

## 2017-02-03 MED ORDER — FUROSEMIDE 40 MG PO TABS
40.0000 mg | ORAL_TABLET | Freq: Every day | ORAL | Status: DC
Start: 2017-02-03 — End: 2017-02-04
  Administered 2017-02-03: 40 mg via ORAL
  Filled 2017-02-03: qty 1

## 2017-02-03 MED ORDER — LISINOPRIL 20 MG PO TABS: 20.0000 mg | ORAL_TABLET | Freq: Every day | ORAL | Status: DC

## 2017-02-03 MED ORDER — ALBUTEROL SULFATE HFA 108 (90 BASE) MCG/ACT IN AERS: 1.0000 | INHALATION_SPRAY | Freq: Four times a day (QID) | RESPIRATORY_TRACT | Status: DC | PRN

## 2017-02-03 MED ORDER — AMLODIPINE BESYLATE 10 MG PO TABS
10.0000 mg | ORAL_TABLET | Freq: Every day | ORAL | Status: DC
  Administered 2017-02-04 – 2017-02-10 (×7): 10 mg via ORAL
  Filled 2017-02-03 (×7): qty 1

## 2017-02-03 MED ORDER — FUROSEMIDE 10 MG/ML IJ SOLN
40.0000 mg | Freq: Once | INTRAMUSCULAR | Status: AC
  Administered 2017-02-03: 40 mg via INTRAVENOUS
  Filled 2017-02-03: qty 4

## 2017-02-03 MED ORDER — HYDRALAZINE HCL 25 MG PO TABS
25.0000 mg | ORAL_TABLET | Freq: Three times a day (TID) | ORAL | Status: DC
  Administered 2017-02-03 – 2017-02-10 (×21): 25 mg via ORAL
  Filled 2017-02-03 (×21): qty 1

## 2017-02-03 MED ORDER — ACETAMINOPHEN 650 MG RE SUPP: 650.0000 mg | Freq: Four times a day (QID) | RECTAL | Status: DC | PRN

## 2017-02-03 MED ORDER — BUSPIRONE HCL 10 MG PO TABS
10.0000 mg | ORAL_TABLET | Freq: Two times a day (BID) | ORAL | Status: DC
  Administered 2017-02-03 – 2017-02-10 (×14): 10 mg via ORAL
  Filled 2017-02-03 (×14): qty 1

## 2017-02-03 MED ORDER — LEVOFLOXACIN IN D5W 500 MG/100ML IV SOLN
500.0000 mg | Freq: Once | INTRAVENOUS | Status: AC
  Administered 2017-02-03: 500 mg via INTRAVENOUS
  Filled 2017-02-03: qty 100

## 2017-02-03 MED ORDER — ONDANSETRON HCL 4 MG PO TABS: 4.0000 mg | ORAL_TABLET | Freq: Four times a day (QID) | ORAL | Status: DC | PRN

## 2017-02-03 MED ORDER — METHYLPREDNISOLONE SODIUM SUCC 125 MG IJ SOLR
60.0000 mg | Freq: Two times a day (BID) | INTRAMUSCULAR | Status: DC
  Administered 2017-02-03 – 2017-02-05 (×4): 60 mg via INTRAVENOUS
  Filled 2017-02-03 (×5): qty 2

## 2017-02-03 MED ORDER — HEPARIN SODIUM (PORCINE) 5000 UNIT/ML IJ SOLN: 5000.0000 [IU] | Freq: Three times a day (TID) | INTRAMUSCULAR | Status: DC

## 2017-02-03 MED ORDER — DILTIAZEM HCL 100 MG IV SOLR: 5.0000 mg/h | INTRAVENOUS | Status: DC

## 2017-02-03 MED ORDER — NICOTINE POLACRILEX 2 MG MT GUM: 2.0000 mg | CHEWING_GUM | OROMUCOSAL | Status: DC | PRN

## 2017-02-03 MED ORDER — ALBUTEROL (5 MG/ML) CONTINUOUS INHALATION SOLN
10.0000 mg/h | INHALATION_SOLUTION | Freq: Once | RESPIRATORY_TRACT | Status: DC
  Filled 2017-02-03: qty 20

## 2017-02-03 MED ORDER — METOPROLOL TARTRATE 5 MG/5ML IV SOLN
5.0000 mg | Freq: Once | INTRAVENOUS | Status: AC
  Administered 2017-02-03: 5 mg via INTRAVENOUS
  Filled 2017-02-03: qty 5

## 2017-02-03 MED ORDER — IPRATROPIUM-ALBUTEROL 0.5-2.5 (3) MG/3ML IN SOLN
3.0000 mL | RESPIRATORY_TRACT | Status: DC
  Administered 2017-02-03 – 2017-02-04 (×2): 3 mL via RESPIRATORY_TRACT
  Filled 2017-02-03 (×2): qty 3

## 2017-02-03 MED ORDER — METHYLPREDNISOLONE SODIUM SUCC 125 MG IJ SOLR
125.0000 mg | Freq: Once | INTRAMUSCULAR | Status: AC
  Administered 2017-02-03: 125 mg via INTRAVENOUS
  Filled 2017-02-03: qty 2

## 2017-02-03 MED ORDER — ASPIRIN EC 81 MG PO TBEC
81.0000 mg | DELAYED_RELEASE_TABLET | Freq: Every day | ORAL | Status: DC
  Administered 2017-02-04 – 2017-02-10 (×7): 81 mg via ORAL
  Filled 2017-02-03 (×7): qty 1

## 2017-02-03 MED ORDER — ALBUTEROL SULFATE (2.5 MG/3ML) 0.083% IN NEBU: 2.5000 mg | INHALATION_SOLUTION | RESPIRATORY_TRACT | Status: DC | PRN

## 2017-02-03 MED ORDER — ALBUTEROL SULFATE (2.5 MG/3ML) 0.083% IN NEBU
10.0000 mg | INHALATION_SOLUTION | Freq: Once | RESPIRATORY_TRACT | Status: AC
  Administered 2017-02-03: 10 mg via RESPIRATORY_TRACT

## 2017-02-03 MED ORDER — TRAZODONE HCL 50 MG PO TABS
25.0000 mg | ORAL_TABLET | Freq: Every evening | ORAL | Status: DC | PRN
  Administered 2017-02-03 – 2017-02-09 (×5): 25 mg via ORAL
  Filled 2017-02-03 (×5): qty 1

## 2017-02-03 MED ORDER — ALBUTEROL SULFATE (2.5 MG/3ML) 0.083% IN NEBU
INHALATION_SOLUTION | RESPIRATORY_TRACT | Status: AC
  Filled 2017-02-03: qty 12

## 2017-02-03 MED ORDER — ANASTROZOLE 1 MG PO TABS
1.0000 mg | ORAL_TABLET | Freq: Every day | ORAL | Status: DC
  Administered 2017-02-04 – 2017-02-10 (×7): 1 mg via ORAL
  Filled 2017-02-03 (×7): qty 1

## 2017-02-03 MED ORDER — ONDANSETRON HCL 4 MG/2ML IJ SOLN: 4.0000 mg | Freq: Four times a day (QID) | INTRAMUSCULAR | Status: DC | PRN

## 2017-02-03 MED ORDER — LISINOPRIL-HYDROCHLOROTHIAZIDE 20-12.5 MG PO TABS: 1.0000 | ORAL_TABLET | Freq: Every day | ORAL | Status: DC

## 2017-02-03 MED ORDER — HYDROCHLOROTHIAZIDE 12.5 MG PO CAPS
12.5000 mg | ORAL_CAPSULE | Freq: Every day | ORAL | Status: DC
  Filled 2017-02-03: qty 1

## 2017-02-03 MED ORDER — ASPIRIN 81 MG PO TBEC: 81.0000 mg | DELAYED_RELEASE_TABLET | Freq: Every day | ORAL | Status: DC

## 2017-02-03 MED ORDER — GABAPENTIN 300 MG PO CAPS
300.0000 mg | ORAL_CAPSULE | Freq: Three times a day (TID) | ORAL | Status: DC
  Administered 2017-02-03 – 2017-02-04 (×4): 300 mg via ORAL
  Filled 2017-02-03 (×4): qty 1

## 2017-02-03 MED ORDER — ACETAMINOPHEN 325 MG PO TABS
650.0000 mg | ORAL_TABLET | Freq: Four times a day (QID) | ORAL | Status: DC | PRN
  Administered 2017-02-03 – 2017-02-07 (×4): 650 mg via ORAL
  Filled 2017-02-03 (×4): qty 2

## 2017-02-03 MED ORDER — ENOXAPARIN SODIUM 60 MG/0.6ML ~~LOC~~ SOLN
1.0000 mg/kg | Freq: Once | SUBCUTANEOUS | Status: AC
  Administered 2017-02-03: 15:00:00 50 mg via SUBCUTANEOUS
  Filled 2017-02-03: qty 0.6

## 2017-02-03 MED ORDER — GUAIFENESIN ER 600 MG PO TB12
1200.0000 mg | ORAL_TABLET | Freq: Two times a day (BID) | ORAL | Status: DC
  Administered 2017-02-03 – 2017-02-10 (×15): 1200 mg via ORAL
  Filled 2017-02-03 (×15): qty 2

## 2017-02-03 MED ORDER — DILTIAZEM LOAD VIA INFUSION: 20.0000 mg | Freq: Once | INTRAVENOUS | Status: DC

## 2017-02-03 MED ORDER — HYDRALAZINE HCL 20 MG/ML IJ SOLN
10.0000 mg | Freq: Once | INTRAMUSCULAR | Status: AC
  Administered 2017-02-03: 10 mg via INTRAVENOUS
  Filled 2017-02-03: qty 1

## 2017-02-03 NOTE — ED Notes (Signed)
Pt has returned from xray

## 2017-02-03 NOTE — ED Triage Notes (Signed)
Pt sates she has had SOB X2 days. Pt has congested cough present. She states hx of COPD. Skin warm and dry. SPO2 91% on room air.

## 2017-02-03 NOTE — Care Management Obs Status (Signed)
MEDICARE OBSERVATION STATUS NOTIFICATION   Patient Details  Name: Victoria Burke MRN: [id number] Date of Birth: Jun 19, 1951   Medicare Observation Status Notification Given:  Yes    Corky Crafts, RN 02/03/2017, 3:13 PM

## 2017-02-03 NOTE — ED Notes (Signed)
Pt informed a urine sample is needed and instructed to use call bell when she has to urinate.

## 2017-02-03 NOTE — ED Notes (Signed)
Respiratory notified of continuous neb. 

## 2017-02-03 NOTE — ED Notes (Signed)
pts HR noted to be fluctuating from 111-150s showing afib. New EKG printed and shown to Dr. Gilford Raid, [place]. This RN also to page admitting

## 2017-02-03 NOTE — ED Notes (Signed)
Pt placed on 2L [place]

## 2017-02-03 NOTE — ED Notes (Signed)
Pt transported to xray at bedside

## 2017-02-03 NOTE — ED Notes (Signed)
Continuous neb has completed and pt states she feels better.

## 2017-02-03 NOTE — Progress Notes (Signed)
Pharmacy Antibiotic Note  Victoria Burke is a 66 y.o. female admitted on 02/03/2017 with COPD exacerbation.  Pharmacy has been consulted for levaquin dosing. Pt is afebrile and WBC is low. Scr is WNL at 1.08.   Plan: Levaquin 500mg  IV x 1 then 250mg  IV Q24H F/u renal fxn, C&S, clinical status   Height: 5\' 2"  (157.5 cm) Weight: 111 lb 1.8 oz (50.4 kg) IBW/kg (Calculated) : 50.1  Temp (24hrs), Avg:97.8 F (36.6 C), Min:97.8 F (36.6 C), Max:97.8 F (36.6 C)   Recent Labs Lab 02/03/17 1046  WBC 3.3*  CREATININE 1.08*    Estimated Creatinine Clearance: 41.1 mL/min (A) (by C-G formula based on SCr of 1.08 mg/dL (H)).    No Known Allergies  Antimicrobials this admission: Levaquin 7/30>>  Dose adjustments this admission: N/A  Microbiology results: Pending  Thank you for allowing pharmacy to be a part of this patient's care.  Foxx Klarich, Rande Lawman 02/03/2017 1:24 PM

## 2017-02-03 NOTE — ED Notes (Signed)
This RN called 3E and spoke with the dept director and informed her the patient may have to be admitted to SDU due to HR and afib. Will monitor patient for 30 mins after lopressor and cardizem meds given, per Dr. Marily Memos.  Melissa, the charge nurse for the ED also aware.
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
## 2017-02-03 NOTE — ED Notes (Signed)
Report attempted x2

## 2017-02-03 NOTE — ED Notes (Signed)
Pt made aware of need for urine sample, pt stated she is unable to go at this time but will call out when she is ready.
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
## 2017-02-04 ENCOUNTER — Inpatient Hospital Stay (HOSPITAL_COMMUNITY): Payer: Medicare Other

## 2017-02-04 DIAGNOSIS — R739 Hyperglycemia, unspecified: Secondary | ICD-10-CM | POA: Diagnosis present

## 2017-02-04 DIAGNOSIS — T380X5A Adverse effect of glucocorticoids and synthetic analogues, initial encounter: Secondary | ICD-10-CM | POA: Diagnosis present

## 2017-02-04 DIAGNOSIS — K219 Gastro-esophageal reflux disease without esophagitis: Secondary | ICD-10-CM | POA: Diagnosis present

## 2017-02-04 DIAGNOSIS — J9601 Acute respiratory failure with hypoxia: Secondary | ICD-10-CM | POA: Diagnosis present

## 2017-02-04 DIAGNOSIS — F1920 Other psychoactive substance dependence, uncomplicated: Secondary | ICD-10-CM

## 2017-02-04 DIAGNOSIS — I130 Hypertensive heart and chronic kidney disease with heart failure and stage 1 through stage 4 chronic kidney disease, or unspecified chronic kidney disease: Secondary | ICD-10-CM | POA: Diagnosis present

## 2017-02-04 DIAGNOSIS — E876 Hypokalemia: Secondary | ICD-10-CM | POA: Diagnosis present

## 2017-02-04 DIAGNOSIS — I5033 Acute on chronic diastolic (congestive) heart failure: Secondary | ICD-10-CM | POA: Diagnosis present

## 2017-02-04 DIAGNOSIS — I480 Paroxysmal atrial fibrillation: Secondary | ICD-10-CM

## 2017-02-04 DIAGNOSIS — Z8551 Personal history of malignant neoplasm of bladder: Secondary | ICD-10-CM | POA: Diagnosis not present

## 2017-02-04 DIAGNOSIS — I493 Ventricular premature depolarization: Secondary | ICD-10-CM | POA: Diagnosis present

## 2017-02-04 DIAGNOSIS — N183 Chronic kidney disease, stage 3 (moderate): Secondary | ICD-10-CM | POA: Diagnosis present

## 2017-02-04 DIAGNOSIS — F1410 Cocaine abuse, uncomplicated: Secondary | ICD-10-CM | POA: Diagnosis present

## 2017-02-04 DIAGNOSIS — Z853 Personal history of malignant neoplasm of breast: Secondary | ICD-10-CM | POA: Diagnosis not present

## 2017-02-04 DIAGNOSIS — G4700 Insomnia, unspecified: Secondary | ICD-10-CM | POA: Diagnosis present

## 2017-02-04 DIAGNOSIS — I340 Nonrheumatic mitral (valve) insufficiency: Secondary | ICD-10-CM | POA: Diagnosis not present

## 2017-02-04 DIAGNOSIS — B1920 Unspecified viral hepatitis C without hepatic coma: Secondary | ICD-10-CM | POA: Diagnosis present

## 2017-02-04 DIAGNOSIS — J441 Chronic obstructive pulmonary disease with (acute) exacerbation: Secondary | ICD-10-CM | POA: Diagnosis present

## 2017-02-04 DIAGNOSIS — F17210 Nicotine dependence, cigarettes, uncomplicated: Secondary | ICD-10-CM | POA: Diagnosis present

## 2017-02-04 DIAGNOSIS — E785 Hyperlipidemia, unspecified: Secondary | ICD-10-CM | POA: Diagnosis present

## 2017-02-04 DIAGNOSIS — J90 Pleural effusion, not elsewhere classified: Secondary | ICD-10-CM | POA: Diagnosis not present

## 2017-02-04 DIAGNOSIS — I472 Ventricular tachycardia: Secondary | ICD-10-CM | POA: Diagnosis present

## 2017-02-04 DIAGNOSIS — I509 Heart failure, unspecified: Secondary | ICD-10-CM | POA: Diagnosis not present

## 2017-02-04 DIAGNOSIS — G8929 Other chronic pain: Secondary | ICD-10-CM | POA: Diagnosis present

## 2017-02-04 DIAGNOSIS — N179 Acute kidney failure, unspecified: Secondary | ICD-10-CM | POA: Diagnosis present

## 2017-02-04 DIAGNOSIS — Z79811 Long term (current) use of aromatase inhibitors: Secondary | ICD-10-CM | POA: Diagnosis not present

## 2017-02-04 DIAGNOSIS — F329 Major depressive disorder, single episode, unspecified: Secondary | ICD-10-CM | POA: Diagnosis present

## 2017-02-04 LAB — RESPIRATORY PANEL BY PCR
Adenovirus: NOT DETECTED
BORDETELLA PERTUSSIS-RVPCR: NOT DETECTED
CHLAMYDOPHILA PNEUMONIAE-RVPPCR: NOT DETECTED
Coronavirus 229E: NOT DETECTED
Coronavirus HKU1: NOT DETECTED
Coronavirus NL63: NOT DETECTED
Coronavirus OC43: NOT DETECTED
INFLUENZA A-RVPPCR: NOT DETECTED
INFLUENZA B-RVPPCR: NOT DETECTED
METAPNEUMOVIRUS-RVPPCR: NOT DETECTED
Mycoplasma pneumoniae: NOT DETECTED
PARAINFLUENZA VIRUS 2-RVPPCR: NOT DETECTED
PARAINFLUENZA VIRUS 3-RVPPCR: NOT DETECTED
PARAINFLUENZA VIRUS 4-RVPPCR: NOT DETECTED
Parainfluenza Virus 1: NOT DETECTED
RESPIRATORY SYNCYTIAL VIRUS-RVPPCR: NOT DETECTED
RHINOVIRUS / ENTEROVIRUS - RVPPCR: DETECTED — AB

## 2017-02-04 LAB — CBC
HCT: 38 % (ref 36.0–46.0)
Hemoglobin: 12.4 g/dL (ref 12.0–15.0)
MCH: 28.6 pg (ref 26.0–34.0)
MCHC: 32.6 g/dL (ref 30.0–36.0)
MCV: 87.6 fL (ref 78.0–100.0)
PLATELETS: 391 10*3/uL (ref 150–400)
RBC: 4.34 MIL/uL (ref 3.87–5.11)
RDW: 14.4 % (ref 11.5–15.5)
WBC: 3.9 10*3/uL — ABNORMAL LOW (ref 4.0–10.5)

## 2017-02-04 LAB — BASIC METABOLIC PANEL
Anion gap: 12 (ref 5–15)
BUN: 23 mg/dL — AB (ref 6–20)
CALCIUM: 8.2 mg/dL — AB (ref 8.9–10.3)
CO2: 20 mmol/L — ABNORMAL LOW (ref 22–32)
CREATININE: 1.74 mg/dL — AB (ref 0.44–1.00)
Chloride: 106 mmol/L (ref 101–111)
GFR, EST AFRICAN AMERICAN: 34 mL/min — AB (ref >60)
GFR, EST NON AFRICAN AMERICAN: 30 mL/min — AB (ref >60)
Glucose, Bld: 225 mg/dL — ABNORMAL HIGH (ref 65–99)
Potassium: 3.1 mmol/L — ABNORMAL LOW (ref 3.5–5.1)
SODIUM: 138 mmol/L (ref 135–145)

## 2017-02-04 LAB — STREP PNEUMONIAE URINARY ANTIGEN: STREP PNEUMO URINARY ANTIGEN: NEGATIVE

## 2017-02-04 LAB — MRSA PCR SCREENING: MRSA BY PCR: NEGATIVE

## 2017-02-04 LAB — MAGNESIUM: MAGNESIUM: 1.5 mg/dL — AB (ref 1.7–2.4)

## 2017-02-04 MED ORDER — IPRATROPIUM-ALBUTEROL 0.5-2.5 (3) MG/3ML IN SOLN
3.0000 mL | Freq: Three times a day (TID) | RESPIRATORY_TRACT | Status: DC
  Administered 2017-02-04 – 2017-02-09 (×16): 3 mL via RESPIRATORY_TRACT
  Filled 2017-02-04 (×16): qty 3

## 2017-02-04 MED ORDER — POTASSIUM CHLORIDE CRYS ER 20 MEQ PO TBCR
40.0000 meq | EXTENDED_RELEASE_TABLET | Freq: Once | ORAL | Status: AC
  Administered 2017-02-04: 40 meq via ORAL
  Filled 2017-02-04: qty 2

## 2017-02-04 MED ORDER — GABAPENTIN 100 MG PO CAPS
200.0000 mg | ORAL_CAPSULE | Freq: Three times a day (TID) | ORAL | Status: DC
  Administered 2017-02-04 – 2017-02-10 (×17): 200 mg via ORAL
  Filled 2017-02-04 (×17): qty 2

## 2017-02-04 MED ORDER — DOXYCYCLINE HYCLATE 100 MG PO TABS
100.0000 mg | ORAL_TABLET | Freq: Two times a day (BID) | ORAL | Status: AC
  Administered 2017-02-04 – 2017-02-09 (×12): 100 mg via ORAL
  Filled 2017-02-04 (×13): qty 1

## 2017-02-04 MED ORDER — MAGNESIUM SULFATE 2 GM/50ML IV SOLN
2.0000 g | Freq: Once | INTRAVENOUS | Status: AC
  Administered 2017-02-05: 2 g via INTRAVENOUS
  Filled 2017-02-04: qty 50

## 2017-02-04 MED ORDER — HEPARIN SODIUM (PORCINE) 5000 UNIT/ML IJ SOLN
5000.0000 [IU] | Freq: Three times a day (TID) | INTRAMUSCULAR | Status: DC
  Administered 2017-02-04 – 2017-02-10 (×18): 5000 [IU] via SUBCUTANEOUS
  Filled 2017-02-04 (×15): qty 1

## 2017-02-04 NOTE — Progress Notes (Signed)
SATURATION QUALIFICATIONS: (This note is used to comply with regulatory documentation for home oxygen)  Patient Saturations on Room Air at Rest = 98%  Patient Saturations on Room Air while Ambulating = 86%  Patient Saturations on 2 Liters of oxygen while Ambulating = 97%  Please briefly explain why patient needs home oxygen:

## 2017-02-04 NOTE — Progress Notes (Signed)
Inpatient Diabetes Program Recommendations  AACE/ADA: New Consensus Statement on Inpatient Glycemic Control (2015)  Target Ranges:  Prepandial:   less than 140 mg/dL      Peak postprandial:   less than 180 mg/dL (1-2 hours)      Critically ill patients:  140 - 180 mg/dL   Results for CHARLEI, RAMSARAN (MRN [id number]) as of 02/04/2017 10:46  Ref. Range 02/03/2017 10:46 02/04/2017 03:43  Glucose Latest Ref Range: 65 - 99 mg/dL 108 (H) 225 (H)   Review of Glycemic Control  Diabetes history: No Outpatient Diabetes medications: NA Current orders for Inpatient glycemic control: None  Inpatient Diabetes Program Recommendations: Correction (SSI): While inpatient and ordered steroids, please consider ordering CBGs with Novolog correction scale ACHS.  Thanks, Barnie Alderman, RN, MSN, CDE Diabetes Coordinator Inpatient Diabetes Program [phone] (Team Pager from 8am to 5pm)

## 2017-02-04 NOTE — Progress Notes (Addendum)
PROGRESS NOTE  Victoria Burke MEQ:[id number] DOB: 19-Jun-1951 DOA: 02/03/2017 PCP: Dorena Dew, FNP  Brief summary:  Pt presenting w/ 3 day h/o progressive SOB. Initially intermittent but now constant. Associated w/ wheezing and productive cough w/ white sputum. Home inhalers w/o benefit. Denies fevers, CP, Palpitations, n/v/diarrhea, abdominal pain, dysuria, frequency, back pain, LE swelling, unintentional wt gain or loss. Pt states that the night before admission she needed to sleep sitting up.     HPI/Recap of past 24 hours:  Continue to  cough with whitish sputum, + wheezing  No fever, denies chest pain, no edema  Assessment/Plan: Active Problems:   Breast cancer of upper-outer quadrant of left female breast (HCC)   Bladder mass   Acute pulmonary edema (HCC)   COPD exacerbation (HCC)   Tobacco abuse   Acute respiratory failure with hypoxemia (HCC)   Chronic diastolic CHF (congestive heart failure) (HCC)   Acute respiratory failure with hypoxia. 84% on RA in ED. -Likely from COPD exacerbation w/ possible mild CHF.   - respiratory viral panel + Rhinovirus,  Urine strep pneuno antigen negative, mrsa screening negative, sputum culture pending collection, Procalcitonin < 0.1 -continue duonebs,  IV solumedrol, d/c levaquin (cr worsening), likely viral infection ,but will cover with doxycycline -wean o2 /steroids as tolerated  Pleural effusion: R>L -She does not have edema, cr elevated after lasix. Clinically dry -Review previous ct /cta chest, patient started to have bilateral pleural effusion R>L since 08/2016 -will repeat ct chest, likely will benefit from US guided thoracentesis if enough to tap. Will need to send for pathology in addition to cell count/culture if able to tap due to h/o breast cancer (lost follow up from 2016 till 11/2016).    Chronic Diastolic CHF:  -Last Echo from 07/2016 w/ 29% EF and diastolic dysfunction noted. No LE edema, Wt gain. Medical  non-compliance w/ diuretic. CXR w/ possible CHF findings. - BNP elevated at 1567, troponin negative,  - lasix 40 IV x2 given by EDP. Cr elevated, clinically does not look fluids overloaded. Despite elevated bnp and ? chf on cxr -hold lasix on 7/31, Start home Lasix 40 PO from 8/1 - I/O, Daily weights. Repeat echocardiogram.  AKI on CKD III -Cr 1.08 on presentation, cr 1.74 today -Will hold lasix, hold lisinopril, d/c levaquin. -Get ua, repeat bmp  -Renal dosing meds  Hypokalemia/hypomagnesemia: replace k/mag  NSVT: continue ccb, keep k>4, mag >2. Echo pending, continue tele.  PAF:   -Currently sinus with frequent pvc's. No anticoagulation because of compliance. Polysubstance abuse per cardiology office note. - Continue with diltiazem. Avoiding beta blocker because of cocaine use.  HTN: taking medications sporadicaly -currently on norvasc/hydralazine, hold lisinopril, d/c HCTZ ( should not be on lasix and hctz)  Hepatitis C: not treated , likely due to current polysubstance abuse  H/o Breast cancer and bladder Cancer: - continue Arimidex (restarted in 11/2016, lost follow up from 2016 till 11/2016)  Chronic pain: - continue neurontin (renal dosing)  Depression:  continue Buspar  Insomnia: - continue trazodone  Tobacco use: 2-3 cigarettes per day - Nicotine replacement gum prn ordered on admission  Polysubstance abuse: cocaine/alcohol Report self referred to outpatient treatment uds on admission +cocaine Last drink reported a few weeks ago  DVT prophylaxis: Hep  Code Status: full  Family Communication: none  Disposition Plan: pending improvement   Consults called: none    Procedures:  none  Antibiotics:  levaquin on 7/30  Doxycycline from 7/31   Objective: BP 132/83 (  BP Location: Right Arm)   Pulse 66   Temp 98.9 F (37.2 C) (Oral)   Resp 18   Ht 5\' 2"  (1.575 m)   Wt 51.8 kg (114 lb 3.2 oz)   SpO2 100%   BMI 20.89 kg/m   Intake/Output  Summary (Last 24 hours) at 02/04/17 0747 Last data filed at 02/04/17 0424  Gross per 24 hour  Intake             1000 ml  Output             1100 ml  Net             -100 ml   Filed Weights   02/03/17 1307 02/03/17 1632 02/04/17 0400  Weight: 50.4 kg (111 lb 1.8 oz) 51.1 kg (112 lb 10.5 oz) 51.8 kg (114 lb 3.2 oz)    Exam:   General:  NAD  Cardiovascular: RRR  Respiratory: diffuse bilateral wheezing  Abdomen: Soft/ND/NT, positive BS  Musculoskeletal: No Edema  Neuro: aaox3  Data Reviewed: Basic Metabolic Panel:  Recent Labs Lab 02/03/17 1046 02/04/17 0343  NA 139 138  K 3.8 3.1*  CL 104 106  CO2 25 20*  GLUCOSE 108* 225*  BUN 8 23*  CREATININE 1.08* 1.74*  CALCIUM 9.4 8.2*   Liver Function Tests: No results for input(s): AST, ALT, ALKPHOS, BILITOT, PROT, ALBUMIN in the last 168 hours. No results for input(s): LIPASE, AMYLASE in the last 168 hours. No results for input(s): AMMONIA in the last 168 hours. CBC:  Recent Labs Lab 02/03/17 1046 02/04/17 0343  WBC 3.3* 3.9*  HGB 15.0 12.4  HCT 47.3* 38.0  MCV 88.6 87.6  PLT 342 391   Cardiac Enzymes:   No results for input(s): CKTOTAL, CKMB, CKMBINDEX, TROPONINI in the last 168 hours. BNP (last 3 results)  Recent Labs  08/26/16 1101 10/15/16 1815 02/03/17 1046  BNP 843.9* 651.5* 1,567.3*    ProBNP (last 3 results) No results for input(s): PROBNP in the last 8760 hours.  CBG: No results for input(s): GLUCAP in the last 168 hours.  No results found for this or any previous visit (from the past 240 hour(s)).   Studies: Dg Chest 2 View  Result Date: 02/03/2017 CLINICAL DATA:  Three days of cough and shortness of breath. History of COPD, breast malignancy treated with radiation therapy, current smoker. EXAM: CHEST  2 VIEW COMPARISON:  Chest x-ray of August 28, 2016 FINDINGS: The lungs are reasonably well inflated. There is mild volume loss on the right due to a small pleural effusion. There is a  trace of pleural fluid on the left. There is no alveolar infiltrate. The cardiac silhouette is enlarged. The central pulmonary vascularity is mildly prominent. There is dense calcification in the wall of the thoracic aorta. IMPRESSION: Chronic bronchitic changes with superimposed small right pleural effusion and trace left pleural effusion. The effusion on the right is larger. Stable cardiomegaly with mild central pulmonary vascular prominence suggests low-grade CHF. Electronically Signed   By: David  Martin[place] M.D.   On: 02/03/2017 12:02    Scheduled Meds: . albuterol  10 mg/hr Nebulization Once  . amLODipine  10 mg Oral Daily  . anastrozole  1 mg Oral Daily  . aspirin EC  81 mg Oral Daily  . busPIRone  10 mg Oral BID  . diltiazem  240 mg Oral Daily  . gabapentin  300 mg Oral TID  . guaiFENesin  1,200 mg Oral BID  . hydrALAZINE  25 mg Oral TID  . ipratropium-albuterol  3 mL Nebulization TID  . methylPREDNISolone sodium succinate  60 mg Intravenous Q12H    Continuous Infusions:   Time spent: 29mins  Josseline Reddin MD, PhD  [facility] Pager [phone]. If 7PM-7AM, please contact night-coverage at [email or web address], password [facility] 02/04/2017, 7:47 AM  LOS: 0 days

## 2017-02-04 NOTE — Clinical Social Work Note (Signed)
Clinical Social Work Assessment  Patient Details  Name: Victoria Burke MRN: [id number] Date of Birth: 1951-02-25  Date of referral:  02/04/17               Reason for consult:  Substance Use/ETOH Abuse                Permission sought to share information with:    Permission granted to share information::  No  Name::        Agency::     Relationship::     Contact Information:     Housing/Transportation Living arrangements for the past 2 months:  Single Family Home Source of Information:  Patient, Medical Team Patient Interpreter Needed:  None Criminal Activity/Legal Involvement Pertinent to Current Situation/Hospitalization:  No - Comment as needed Significant Relationships:  Adult Children, Other(Comment) (Stepmother) Lives with:  Adult Children Do you feel safe going back to the place where you live?  Yes Need for family participation in patient care:  Yes (Comment)  Care giving concerns:  CSW consulted for substance abuse resources.   Social Worker assessment / plan:  CSW met with patient. No supports at bedside. CSW introduced role and inquired about need for substance abuse resources. Patient stated that she is currently getting treatment at [facility] but is frustrated that she has to go five days per week. Patient would like to go to treatment 2-3 times per week instead and wants a doctor note for that. CSW provided list of local substance abuse treatment centers and encouraged patient to call around to different facilities and see which one is a good fit for her. CSW explained that patient has to follow attendance requirements for each treatment facility. When asked why she cannot go to the current treatment center five days a week, patient replied, "I'm sick." She does not want to ask her attending for a letter. No further concerns. CSW encouraged patient to contact CSW as needed. CSW signing off as social work intervention is no longer needed.  Employment status:   Retired Nurse, adult PT Recommendations:  Not assessed at this time Information / Referral to community resources:  Residential Substance Abuse Treatment Options, Outpatient Substance Abuse Treatment Options  Patient/Family's Response to care:  Patient agreeable to receiving resources. Patient's family supportive and involved in patient's care. Patient upset that CSW could not provide note saying that she can only go to treatment 2-3 times per week.  Patient/Family's Understanding of and Emotional Response to Diagnosis, Current Treatment, and Prognosis:  Patient has a good understanding of the reason for admission and the social work consult. Patient appears pleased with hospital care.  Emotional Assessment Appearance:  Appears stated age Attitude/Demeanor/Rapport:  Other (Irritable) Affect (typically observed):  Irritable Orientation:  Oriented to Self, Oriented to Place, Oriented to  Time, Oriented to Situation Alcohol / Substance use:  Alcohol Use, Illicit Drugs Psych involvement (Current and /or in the community):  No (Comment)  Discharge Needs  Concerns to be addressed:  No discharge needs identified Readmission within the last 30 days:  No Current discharge risk:  Substance Abuse Barriers to Discharge:  Continued Medical Work up   Candie Chroman, LCSW 02/04/2017, 12:12 PM

## 2017-02-05 ENCOUNTER — Inpatient Hospital Stay (HOSPITAL_COMMUNITY): Payer: Medicare Other

## 2017-02-05 DIAGNOSIS — I509 Heart failure, unspecified: Secondary | ICD-10-CM

## 2017-02-05 DIAGNOSIS — J90 Pleural effusion, not elsewhere classified: Secondary | ICD-10-CM

## 2017-02-05 DIAGNOSIS — I340 Nonrheumatic mitral (valve) insufficiency: Secondary | ICD-10-CM

## 2017-02-05 LAB — CBC
HCT: 39.4 % (ref 36.0–46.0)
Hemoglobin: 12.7 g/dL (ref 12.0–15.0)
MCH: 28.4 pg (ref 26.0–34.0)
MCHC: 32.2 g/dL (ref 30.0–36.0)
MCV: 88.1 fL (ref 78.0–100.0)
PLATELETS: 422 10*3/uL — AB (ref 150–400)
RBC: 4.47 MIL/uL (ref 3.87–5.11)
RDW: 14.9 % (ref 11.5–15.5)
WBC: 13.1 10*3/uL — AB (ref 4.0–10.5)

## 2017-02-05 LAB — PROCALCITONIN: Procalcitonin: <0.1 ng/mL

## 2017-02-05 LAB — COMPREHENSIVE METABOLIC PANEL
ALT: 15 U/L (ref 14–54)
AST: 19 U/L (ref 15–41)
Albumin: 2.2 g/dL — ABNORMAL LOW (ref 3.5–5.0)
Alkaline Phosphatase: 83 U/L (ref 38–126)
Anion gap: 11 (ref 5–15)
BUN: 33 mg/dL — ABNORMAL HIGH (ref 6–20)
CHLORIDE: 105 mmol/L (ref 101–111)
CO2: 22 mmol/L (ref 22–32)
Calcium: 8.7 mg/dL — ABNORMAL LOW (ref 8.9–10.3)
Creatinine, Ser: 1.99 mg/dL — ABNORMAL HIGH (ref 0.44–1.00)
GFR, EST AFRICAN AMERICAN: 29 mL/min — AB (ref >60)
GFR, EST NON AFRICAN AMERICAN: 25 mL/min — AB (ref >60)
Glucose, Bld: 285 mg/dL — ABNORMAL HIGH (ref 65–99)
POTASSIUM: 3.6 mmol/L (ref 3.5–5.1)
SODIUM: 138 mmol/L (ref 135–145)
Total Bilirubin: 0.4 mg/dL (ref 0.3–1.2)
Total Protein: 7.9 g/dL (ref 6.5–8.1)

## 2017-02-05 LAB — MAGNESIUM: MAGNESIUM: 2.2 mg/dL (ref 1.7–2.4)

## 2017-02-05 LAB — URINALYSIS, ROUTINE W REFLEX MICROSCOPIC
Bacteria, UA: NONE SEEN
Bilirubin Urine: NEGATIVE
GLUCOSE, UA: 50 mg/dL — AB
Hgb urine dipstick: NEGATIVE
KETONES UR: NEGATIVE mg/dL
LEUKOCYTES UA: NEGATIVE
Nitrite: NEGATIVE
PH: 5 (ref 5.0–8.0)
Protein, ur: >=300 mg/dL — AB
SPECIFIC GRAVITY, URINE: 1.02 (ref 1.005–1.030)

## 2017-02-05 LAB — ECHOCARDIOGRAM COMPLETE
Height: 62 in
WEIGHTICAEL: 1957.68 [oz_av]

## 2017-02-05 MED ORDER — METHYLPREDNISOLONE SODIUM SUCC 125 MG IJ SOLR
80.0000 mg | Freq: Two times a day (BID) | INTRAMUSCULAR | Status: DC
  Administered 2017-02-05 – 2017-02-08 (×6): 80 mg via INTRAVENOUS
  Filled 2017-02-05 (×6): qty 2

## 2017-02-05 MED ORDER — HYDROXYZINE HCL 25 MG PO TABS
50.0000 mg | ORAL_TABLET | Freq: Once | ORAL | Status: AC
  Administered 2017-02-05: 50 mg via ORAL
  Filled 2017-02-05: qty 2

## 2017-02-05 NOTE — Progress Notes (Signed)
PROGRESS NOTE  Victoria Burke ESP:[id number] DOB: 1950/08/16 DOA: 02/03/2017 PCP: Dorena Dew, FNP  Brief summary:  Pt presenting w/ 3 day h/o progressive SOB. Initially intermittent but now constant. Associated w/ wheezing and productive cough w/ white sputum. Home inhalers w/o benefit. Denies fevers, CP, Palpitations, n/v/diarrhea, abdominal pain, dysuria, frequency, back pain, LE swelling, unintentional wt gain or loss. Pt states that the night before admission she needed to sleep sitting up.   HPI/Recap of past 24 hours: Patient reports no new complaints still not feeling much better as far as her breathing improvement is concerned. Still reporting wheezes  Assessment/Plan: Active Problems:   Breast cancer of upper-outer quadrant of left female breast (HCC)   Bladder mass   Acute pulmonary edema (HCC)   COPD exacerbation (HCC)   Tobacco abuse   Acute respiratory failure with hypoxemia (HCC)   Chronic diastolic CHF (congestive heart failure) (HCC)   Acute hypoxemic respiratory failure (HCC)   Acute respiratory failure with hypoxia. 84% on RA in ED. -Likely from COPD exacerbation w/ possible mild CHF.   - respiratory viral panel + Rhinovirus,  Urine strep pneuno antigen negative, mrsa screening negative, sputum culture pending collection, Procalcitonin < 0.1 -continue duonebs,  will increase IV solumedrol dose, antibiotics switched doxycycline secondary to creatinine, likely viral infection  -wean o2 /steroids as tolerated  Pleural effusion: R>L -She does not have edema, cr elevated after lasix. Clinically dry -Review previous ct /cta chest, patient started to have bilateral pleural effusion R>L since 08/2016 -CTA chest repeated and reporting chronic effusions. Reported small last x-ray    Chronic Diastolic CHF:  -Last Echo from 07/2016 w/ 63% EF and diastolic dysfunction noted. No LE edema, Wt gain. Medical non-compliance w/ diuretic. CXR w/ possible CHF findings. - BNP  elevated at 1567, troponin negative,  - lasix 40 IV x2 given by EDP. Cr elevated, clinically does not look fluids overloaded. Despite elevated bnp and ? chf on cxr -hold lasix on 7/31, Start home Lasix 40 PO from 8/1 - I/O, Daily weights. Repeat echocardiogram.  AKI on CKD III -Cr 1.08 on presentation, cr 1.74 today -Will continue to hold lasix, hold lisinopril, d/c levaquin. -Renal dosing meds  Hypokalemia/hypomagnesemia: replace k/mag  NSVT: continue ccb, keep k>4, mag >2. Echo pending, continue tele.  PAF:   -Currently sinus with frequent pvc's. No anticoagulation because of compliance. Polysubstance abuse per cardiology office note. - Continue with diltiazem. Avoiding beta blocker because of cocaine use.  HTN: taking medications sporadicaly -currently on norvasc/hydralazine, hold lisinopril, d/c HCTZ ( should not be on lasix and hctz)  Hepatitis C: not treated , likely due to current polysubstance abuse  H/o Breast cancer and bladder Cancer: - continue Arimidex (restarted in 11/2016, lost follow up from 2016 till 11/2016)  Chronic pain: - continue neurontin (renal dosing)  Depression:  continue Buspar  Insomnia: - continue trazodone  Tobacco use: 2-3 cigarettes per day - Nicotine replacement gum prn ordered on admission  Polysubstance abuse: cocaine/alcohol Report self referred to outpatient treatment uds on admission +cocaine Last drink reported a few weeks ago  DVT prophylaxis: Hep  Code Status: full  Family Communication: none  Disposition Plan:  pending improvement in respiratory condition Consults called: none    Procedures:  none  Antibiotics:  levaquin on 7/30  Doxycycline from 7/31   Objective: BP (!) 157/92 (BP Location: Right Arm)   Pulse (!) 59   Temp 98.7 F (37.1 C) (Oral)   Resp 18  Ht 5\' 2"  (1.575 m)   Wt 55.5 kg (122 lb 5.7 oz)   SpO2 95%   BMI 22.38 kg/m   Intake/Output Summary (Last 24 hours) at 02/05/17 1547 Last  data filed at 02/05/17 1324  Gross per 24 hour  Intake              600 ml  Output              810 ml  Net             -210 ml   Filed Weights   02/03/17 1632 02/04/17 0400 02/05/17 0406  Weight: 51.1 kg (112 lb 10.5 oz) 51.8 kg (114 lb 3.2 oz) 55.5 kg (122 lb 5.7 oz)    Exam:   General:  NAD, Alert and awake  Cardiovascular: RRR, no rubs or gallops  Respiratory: diffuse bilateral wheezing on expiration mild, equal chest rise, no rales  Abdomen: Soft/ND/NT, positive BS  Musculoskeletal: No Edema, equal tone  Neuro: aaox3, no facial asymmetry  Data Reviewed: Basic Metabolic Panel:  Recent Labs Lab 02/03/17 1046 02/04/17 0343 02/04/17 0700 02/05/17 0445  NA 139 138  --  138  K 3.8 3.1*  --  3.6  CL 104 106  --  105  CO2 25 20*  --  22  GLUCOSE 108* 225*  --  285*  BUN 8 23*  --  33*  CREATININE 1.08* 1.74*  --  1.99*  CALCIUM 9.4 8.2*  --  8.7*  MG  --   --  1.5* 2.2   Liver Function Tests:  Recent Labs Lab 02/05/17 0445  AST 19  ALT 15  ALKPHOS 83  BILITOT 0.4  PROT 7.9  ALBUMIN 2.2*   No results for input(s): LIPASE, AMYLASE in the last 168 hours. No results for input(s): AMMONIA in the last 168 hours. CBC:  Recent Labs Lab 02/03/17 1046 02/04/17 0343 02/05/17 0445  WBC 3.3* 3.9* 13.1*  HGB 15.0 12.4 12.7  HCT 47.3* 38.0 39.4  MCV 88.6 87.6 88.1  PLT 342 391 422*   Cardiac Enzymes:   No results for input(s): CKTOTAL, CKMB, CKMBINDEX, TROPONINI in the last 168 hours. BNP (last 3 results)  Recent Labs  08/26/16 1101 10/15/16 1815 02/03/17 1046  BNP 843.9* 651.5* 1,567.3*    ProBNP (last 3 results) No results for input(s): PROBNP in the last 8760 hours.  CBG: No results for input(s): GLUCAP in the last 168 hours.  Recent Results (from the past 240 hour(s))  Respiratory Panel by PCR     Status: Abnormal   Collection Time: 02/04/17  8:00 AM  Result Value Ref Range Status   Adenovirus NOT DETECTED NOT DETECTED Final    Coronavirus 229E NOT DETECTED NOT DETECTED Final   Coronavirus HKU1 NOT DETECTED NOT DETECTED Final   Coronavirus NL63 NOT DETECTED NOT DETECTED Final   Coronavirus OC43 NOT DETECTED NOT DETECTED Final   Metapneumovirus NOT DETECTED NOT DETECTED Final   Rhinovirus / Enterovirus DETECTED (A) NOT DETECTED Final   Influenza A NOT DETECTED NOT DETECTED Final   Influenza B NOT DETECTED NOT DETECTED Final   Parainfluenza Virus 1 NOT DETECTED NOT DETECTED Final   Parainfluenza Virus 2 NOT DETECTED NOT DETECTED Final   Parainfluenza Virus 3 NOT DETECTED NOT DETECTED Final   Parainfluenza Virus 4 NOT DETECTED NOT DETECTED Final   Respiratory Syncytial Virus NOT DETECTED NOT DETECTED Final   Bordetella pertussis NOT DETECTED NOT DETECTED Final   Chlamydophila pneumoniae  NOT DETECTED NOT DETECTED Final   Mycoplasma pneumoniae NOT DETECTED NOT DETECTED Final  MRSA PCR Screening     Status: None   Collection Time: 02/04/17  8:00 AM  Result Value Ref Range Status   MRSA by PCR NEGATIVE NEGATIVE Final    Comment:        The GeneXpert MRSA Assay (FDA approved for NASAL specimens only), is one component of a comprehensive MRSA colonization surveillance program. It is not intended to diagnose MRSA infection nor to guide or monitor treatment for MRSA infections.      Studies: Ct Chest Wo Contrast  Result Date: 02/05/2017 CLINICAL DATA:  Shortness of breath with wheezing and productive cough for 3 days. No chest pain. Pleural effusion. History of left breast cancer and bladder cancer. EXAM: CT CHEST WITHOUT CONTRAST TECHNIQUE: Multidetector CT imaging of the chest was performed following the standard protocol without IV contrast. COMPARISON:  Chest CT 10/17/2016, 08/27/2016 and 09/20/2015. FINDINGS: Cardiovascular: Extensive atherosclerosis of the aorta, great vessels and coronary arteries is again noted. No acute vascular findings are seen on noncontrast imaging. There is stable cardiac enlargement.  No significant pericardial fluid or thickening. Mediastinum/Nodes: Multiple enlarged right axillary, mediastinal and hilar lymph nodes are again noted, similar to the most recent study and only mildly progressive from 2017.A right axillary node measures up to 13 mm on image 38 (previously 11 mm). AP window node measures 20 mm on image 56 (previously 19 mm). Surgical clips are present in the left axilla from previous axillary node dissection. The thyroid gland, trachea and esophagus demonstrate no significant findings. Lungs/Pleura: Chronic right-greater-than-left dependent pleural effusions have not significantly changed in volume. The right pleural effusion may be partially loculated. There is diffuse centrilobular and paraseptal emphysema with associated central airway thickening. Subpleural reticulation anteriorly in the lingula is attributed to prior radiation therapy. Chronic dependent pulmonary opacities appear unchanged from the most recent study, probably chronic atelectasis. No evidence of acute superimposed infiltrate, endobronchial lesion or suspicious pulmonary nodule. Upper abdomen: The visualized upper abdomen appears stable without suspicious findings. Enlargement of the left adrenal gland appears grossly stable, but remains incompletely visualized. Musculoskeletal/Chest wall: There is no chest wall mass or suspicious osseous finding. IMPRESSION: 1. No acute chest findings demonstrated. 2. Stable chronic bilateral pleural effusions and bibasilar atelectasis. No superimposed infiltrate identified. 3. Underlying emphysema and central airway thickening. 4. Similar right axillary, mediastinal and hilar adenopathy. Chronicity favors a benign/reactive etiology. Metastatic breast cancer and lymphoproliferative process considered less likely. 5.  Aortic Atherosclerosis (ICD10-I70.0). Electronically Signed   By: Richardean Sale M.D.   On: 02/05/2017 10:39    Scheduled Meds: . albuterol  10 mg/hr  Nebulization Once  . amLODipine  10 mg Oral Daily  . anastrozole  1 mg Oral Daily  . aspirin EC  81 mg Oral Daily  . busPIRone  10 mg Oral BID  . diltiazem  240 mg Oral Daily  . doxycycline  100 mg Oral Q12H  . gabapentin  200 mg Oral TID  . guaiFENesin  1,200 mg Oral BID  . heparin subcutaneous  5,000 Units Subcutaneous Q8H  . hydrALAZINE  25 mg Oral TID  . ipratropium-albuterol  3 mL Nebulization TID  . methylPREDNISolone sodium succinate  60 mg Intravenous Q12H    Continuous Infusions:   Time spent: 35 mins  Velvet Bathe MD, PhD  [facility] Pager [phone]. If 7PM-7AM, please contact night-coverage at [email or web address], password [facility] 02/05/2017, 3:47 PM  LOS: 1 day

## 2017-02-05 NOTE — Progress Notes (Signed)
  Echocardiogram 2D Echocardiogram has been performed.  Victoria Burke 02/05/2017, 11:15 AM

## 2017-02-06 DIAGNOSIS — J9601 Acute respiratory failure with hypoxia: Secondary | ICD-10-CM

## 2017-02-06 LAB — BASIC METABOLIC PANEL
ANION GAP: 8 (ref 5–15)
BUN: 44 mg/dL — ABNORMAL HIGH (ref 6–20)
CHLORIDE: 106 mmol/L (ref 101–111)
CO2: 20 mmol/L — AB (ref 22–32)
Calcium: 9 mg/dL (ref 8.9–10.3)
Creatinine, Ser: 1.7 mg/dL — ABNORMAL HIGH (ref 0.44–1.00)
GFR calc non Af Amer: 30 mL/min — ABNORMAL LOW (ref >60)
GFR, EST AFRICAN AMERICAN: 35 mL/min — AB (ref >60)
GLUCOSE: 395 mg/dL — AB (ref 65–99)
POTASSIUM: 3.8 mmol/L (ref 3.5–5.1)
Sodium: 134 mmol/L — ABNORMAL LOW (ref 135–145)

## 2017-02-06 MED ORDER — BENZONATATE 100 MG PO CAPS
100.0000 mg | ORAL_CAPSULE | Freq: Three times a day (TID) | ORAL | Status: DC | PRN
  Administered 2017-02-06: 100 mg via ORAL
  Filled 2017-02-06: qty 1

## 2017-02-06 MED ORDER — FUROSEMIDE 40 MG PO TABS
40.0000 mg | ORAL_TABLET | Freq: Every day | ORAL | Status: DC
  Administered 2017-02-06 – 2017-02-10 (×5): 40 mg via ORAL
  Filled 2017-02-06 (×5): qty 1

## 2017-02-06 NOTE — Progress Notes (Signed)
Inpatient Diabetes Program Recommendations  AACE/ADA: New Consensus Statement on Inpatient Glycemic Control (2015)  Target Ranges:  Prepandial:   less than 140 mg/dL      Peak postprandial:   less than 180 mg/dL (1-2 hours)      Critically ill patients:  140 - 180 mg/dL   Results for Victoria Burke, Victoria Burke (MRN [id number]) as of 02/06/2017 10:17  Ref. Range 10/31/2016 10:40 02/03/2017 10:46 02/04/2017 03:43 02/05/2017 04:45 02/06/2017 03:24  Glucose Latest Ref Range: 65 - 99 mg/dL 86 108 (H) 225 (H) 285 (H) 395 (H)   Review of Glycemic Control  Diabetes history: No Outpatient Diabetes medications: NA Current orders for Inpatient glycemic control: None  Inpatient Diabetes Program Recommendations: Correction (SSI): While inpatient and ordered steroids, please consider ordering CBGs with Novolog correction scale ACHS.  Thanks, Barnie Alderman, RN, MSN, CDE Diabetes Coordinator Inpatient Diabetes Program [phone] (Team Pager from 8am to 5pm)

## 2017-02-06 NOTE — Progress Notes (Signed)
PROGRESS NOTE  Victoria Burke DDU:[id number] DOB: 04-28-1951 DOA: 02/03/2017 PCP: Dorena Dew, FNP  Brief summary:  Pt presenting w/ 3 day h/o progressive SOB. Initially intermittent but now constant. Associated w/ wheezing and productive cough w/ white sputum. Home inhalers w/o benefit. Denies fevers, CP, Palpitations, n/v/diarrhea, abdominal pain, dysuria, frequency, back pain, LE swelling, unintentional wt gain or loss. Pt states that the night before admission she needed to sleep sitting up.   HPI/Recap of past 24 hours: She reports improvement in respiratory condition but not at baseline.  Assessment/Plan: Active Problems:   Breast cancer of upper-outer quadrant of left female breast (HCC)   Bladder mass   Acute pulmonary edema (HCC)   COPD exacerbation (HCC)   Tobacco abuse   Acute respiratory failure with hypoxemia (HCC)   Chronic diastolic CHF (congestive heart failure) (HCC)   Acute hypoxemic respiratory failure (HCC)   Acute respiratory failure with hypoxia. 84% on RA in ED. -Likely from COPD exacerbation w/ possible mild CHF.   - respiratory viral panel + Rhinovirus,  Urine strep pneuno antigen negative, mrsa screening negative, sputum culture pending collection, Procalcitonin < 0.1 -continue duonebs,  continue current Solu-Medrol dose, continue current doxycycline, exacerbation most likely secondary to viral infection  -wean o2 /steroids as tolerated  Pleural effusion: R>L -She does not have edema, cr elevated after lasix. Clinically dry -Review previous ct /cta chest, patient started to have bilateral pleural effusion R>L since 08/2016 -CTA chest repeated and reporting chronic effusions. Reported small last x-ray   Chronic Diastolic CHF:  -Last Echo from 07/2016 w/ 23% EF and diastolic dysfunction noted. No LE edema, Wt gain. Medical non-compliance w/ diuretic.  - BNP elevated at 1567, troponin negative,  - lasix 40 IV x2 given by EDP. Cr elevated, clinically  does not look fluids overloaded.  - hold lasix on 7/31, Restarted home lasix regimen. - I/O, Daily weights. Repeat echocardiogram.  AKI on CKD III -Cr 1.08 on presentation, cr 1.70 on last check, reassess next am. -Will continue to hold lisinopril, d/c levaquin. -Renal dosing meds - Monitor S creatinine while on lasix  Hypokalemia/hypomagnesemia: replaced and WNL on last check.  NSVT: continue ccb, keep k>4, mag >2. Echo reporting ef of 55-60%, continue tele. - resolved  PAF:   -Currently sinus with frequent pvc's. No anticoagulation because of compliance. Polysubstance abuse per cardiology office note. - Continue with diltiazem. Avoiding beta blocker because of cocaine use.  HTN: taking medications sporadicaly -currently on norvasc/hydralazine, hold lisinopril, d/c HCTZ ( should not be on lasix and hctz)  Hepatitis C: not treated, likely due to current polysubstance abuse  H/o Breast cancer and bladder Cancer: - continue Arimidex (restarted in 11/2016, lost follow up from 2016 till 11/2016)  Chronic pain: - stable on neurontin (renal dosing)  Depression:  - stable on Buspar  Insomnia: - continue trazodone  Tobacco use: 2-3 cigarettes per day - Nicotine replacement gum prn ordered on admission  Polysubstance abuse: cocaine/alcohol Report self referred to outpatient treatment uds on admission +cocaine Last drink reported a few weeks ago  DVT prophylaxis: Hep  Code Status: full  Family Communication: none  Disposition Plan:  d/c next am with continued improvement in condition. Consults called: none    Procedures:  none  Antibiotics:  levaquin on 7/30  Doxycycline from 7/31   Objective: BP (!) 162/79   Pulse 89   Temp (!) 97.5 F (36.4 C) (Oral)   Resp 18   Ht 5\' 2"  (1.575  m)   Wt 54.8 kg (120 lb 12.8 oz)   SpO2 98%   BMI 22.09 kg/m   Intake/Output Summary (Last 24 hours) at 02/06/17 1651 Last data filed at 02/06/17 1300  Gross per 24 hour   Intake             1080 ml  Output             1570 ml  Net             -490 ml   Filed Weights   02/04/17 0400 02/05/17 0406 02/06/17 0534  Weight: 51.8 kg (114 lb 3.2 oz) 55.5 kg (122 lb 5.7 oz) 54.8 kg (120 lb 12.8 oz)    Exam:   General:   Alert and awake, NAD  Cardiovascular: RRR, no gallops or rubs  Respiratory: no wheezes, equal chest rise, no rales, Prolonged expiratory phase  Abdomen: Soft/ND/NT, positive BS  Musculoskeletal: No Edema, equal tone  Neuro: aaox3, no facial asymmetry  Data Reviewed: Basic Metabolic Panel:  Recent Labs Lab 02/03/17 1046 02/04/17 0343 02/04/17 0700 02/05/17 0445 02/06/17 0324  NA 139 138  --  138 134*  K 3.8 3.1*  --  3.6 3.8  CL 104 106  --  105 106  CO2 25 20*  --  22 20*  GLUCOSE 108* 225*  --  285* 395*  BUN 8 23*  --  33* 44*  CREATININE 1.08* 1.74*  --  1.99* 1.70*  CALCIUM 9.4 8.2*  --  8.7* 9.0  MG  --   --  1.5* 2.2  --    Liver Function Tests:  Recent Labs Lab 02/05/17 0445  AST 19  ALT 15  ALKPHOS 83  BILITOT 0.4  PROT 7.9  ALBUMIN 2.2*   No results for input(s): LIPASE, AMYLASE in the last 168 hours. No results for input(s): AMMONIA in the last 168 hours. CBC:  Recent Labs Lab 02/03/17 1046 02/04/17 0343 02/05/17 0445  WBC 3.3* 3.9* 13.1*  HGB 15.0 12.4 12.7  HCT 47.3* 38.0 39.4  MCV 88.6 87.6 88.1  PLT 342 391 422*   Cardiac Enzymes:   No results for input(s): CKTOTAL, CKMB, CKMBINDEX, TROPONINI in the last 168 hours. BNP (last 3 results)  Recent Labs  08/26/16 1101 10/15/16 1815 02/03/17 1046  BNP 843.9* 651.5* 1,567.3*    ProBNP (last 3 results) No results for input(s): PROBNP in the last 8760 hours.  CBG: No results for input(s): GLUCAP in the last 168 hours.  Recent Results (from the past 240 hour(s))  Respiratory Panel by PCR     Status: Abnormal   Collection Time: 02/04/17  8:00 AM  Result Value Ref Range Status   Adenovirus NOT DETECTED NOT DETECTED Final    Coronavirus 229E NOT DETECTED NOT DETECTED Final   Coronavirus HKU1 NOT DETECTED NOT DETECTED Final   Coronavirus NL63 NOT DETECTED NOT DETECTED Final   Coronavirus OC43 NOT DETECTED NOT DETECTED Final   Metapneumovirus NOT DETECTED NOT DETECTED Final   Rhinovirus / Enterovirus DETECTED (A) NOT DETECTED Final   Influenza A NOT DETECTED NOT DETECTED Final   Influenza B NOT DETECTED NOT DETECTED Final   Parainfluenza Virus 1 NOT DETECTED NOT DETECTED Final   Parainfluenza Virus 2 NOT DETECTED NOT DETECTED Final   Parainfluenza Virus 3 NOT DETECTED NOT DETECTED Final   Parainfluenza Virus 4 NOT DETECTED NOT DETECTED Final   Respiratory Syncytial Virus NOT DETECTED NOT DETECTED Final   Bordetella pertussis NOT DETECTED  NOT DETECTED Final   Chlamydophila pneumoniae NOT DETECTED NOT DETECTED Final   Mycoplasma pneumoniae NOT DETECTED NOT DETECTED Final  MRSA PCR Screening     Status: None   Collection Time: 02/04/17  8:00 AM  Result Value Ref Range Status   MRSA by PCR NEGATIVE NEGATIVE Final    Comment:        The GeneXpert MRSA Assay (FDA approved for NASAL specimens only), is one component of a comprehensive MRSA colonization surveillance program. It is not intended to diagnose MRSA infection nor to guide or monitor treatment for MRSA infections.      Studies: No results found.  Scheduled Meds: . albuterol  10 mg/hr Nebulization Once  . amLODipine  10 mg Oral Daily  . anastrozole  1 mg Oral Daily  . aspirin EC  81 mg Oral Daily  . busPIRone  10 mg Oral BID  . diltiazem  240 mg Oral Daily  . doxycycline  100 mg Oral Q12H  . gabapentin  200 mg Oral TID  . guaiFENesin  1,200 mg Oral BID  . heparin subcutaneous  5,000 Units Subcutaneous Q8H  . hydrALAZINE  25 mg Oral TID  . ipratropium-albuterol  3 mL Nebulization TID  . methylPREDNISolone sodium succinate  80 mg Intravenous Q12H    Continuous Infusions:   Time spent: > 35 minutes  Velvet Bathe MD, PhD  [facility] Pager [phone]. If 7PM-7AM, please contact night-coverage at [email or web address], password [facility] 02/06/2017, 4:51 PM  LOS: 2 days

## 2017-02-07 LAB — BASIC METABOLIC PANEL
ANION GAP: 8 (ref 5–15)
BUN: 55 mg/dL — ABNORMAL HIGH (ref 6–20)
CALCIUM: 8.8 mg/dL — AB (ref 8.9–10.3)
CO2: 20 mmol/L — AB (ref 22–32)
CREATININE: 1.96 mg/dL — AB (ref 0.44–1.00)
Chloride: 105 mmol/L (ref 101–111)
GFR, EST AFRICAN AMERICAN: 30 mL/min — AB (ref >60)
GFR, EST NON AFRICAN AMERICAN: 26 mL/min — AB (ref >60)
GLUCOSE: 431 mg/dL — AB (ref 65–99)
Potassium: 3.4 mmol/L — ABNORMAL LOW (ref 3.5–5.1)
Sodium: 133 mmol/L — ABNORMAL LOW (ref 135–145)

## 2017-02-07 LAB — GLUCOSE, CAPILLARY
GLUCOSE-CAPILLARY: 335 mg/dL — AB (ref 65–99)
GLUCOSE-CAPILLARY: 149 mg/dL — AB (ref 65–99)
Glucose-Capillary: 472 mg/dL — ABNORMAL HIGH (ref 65–99)
Glucose-Capillary: 484 mg/dL — ABNORMAL HIGH (ref 65–99)

## 2017-02-07 LAB — PROCALCITONIN: Procalcitonin: <0.1 ng/mL

## 2017-02-07 MED ORDER — INSULIN ASPART 100 UNIT/ML ~~LOC~~ SOLN
10.0000 [IU] | Freq: Once | SUBCUTANEOUS | Status: AC
  Administered 2017-02-07: 10 [IU] via SUBCUTANEOUS

## 2017-02-07 MED ORDER — INSULIN ASPART 100 UNIT/ML ~~LOC~~ SOLN
0.0000 [IU] | Freq: Every day | SUBCUTANEOUS | Status: DC
  Administered 2017-02-09: 2 [IU] via SUBCUTANEOUS

## 2017-02-07 MED ORDER — INSULIN ASPART 100 UNIT/ML ~~LOC~~ SOLN
0.0000 [IU] | Freq: Three times a day (TID) | SUBCUTANEOUS | Status: DC
  Administered 2017-02-07: 20 [IU] via SUBCUTANEOUS
  Administered 2017-02-07: 15 [IU] via SUBCUTANEOUS
  Administered 2017-02-08: 4 [IU] via SUBCUTANEOUS
  Administered 2017-02-08: 11 [IU] via SUBCUTANEOUS
  Administered 2017-02-08: 3 [IU] via SUBCUTANEOUS
  Administered 2017-02-09: 11 [IU] via SUBCUTANEOUS
  Administered 2017-02-09: 4 [IU] via SUBCUTANEOUS
  Administered 2017-02-09: 11 [IU] via SUBCUTANEOUS
  Administered 2017-02-10: 7 [IU] via SUBCUTANEOUS
  Administered 2017-02-10: 4 [IU] via SUBCUTANEOUS

## 2017-02-07 NOTE — Progress Notes (Signed)
Page to Dr Wendee Beavers regarding recheck blood sugar.    3e11. CBG 484 1hr s/p 20u and eating lunch. please advise.

## 2017-02-07 NOTE — Progress Notes (Signed)
Pt denies complaints at this time, states she walked well without complications. Pt denies needs at this time.

## 2017-02-07 NOTE — Progress Notes (Signed)
Inpatient Diabetes Program Recommendations  AACE/ADA: New Consensus Statement on Inpatient Glycemic Control (2015)  Target Ranges:  Prepandial:   less than 140 mg/dL      Peak postprandial:   less than 180 mg/dL (1-2 hours)      Critically ill patients:  140 - 180 mg/dL  Results for PEPPER, KERRICK (MRN [id number]) as of 02/07/2017 08:36  Ref. Range 02/03/2017 10:46 02/04/2017 03:43 02/05/2017 04:45 02/06/2017 03:24 02/07/2017 04:58  Glucose Latest Ref Range: 65 - 99 mg/dL 108 (H) 225 (H) 285 (H) 395 (H) 431 (H)  Results for AANVI, VOYLES (MRN [id number]) as of 02/07/2017 08:36  Ref. Range 10/15/2016 11:16  Hemoglobin A1C Latest Ref Range: 4.8 - 5.6 % 6.0 (H)    Review of Glycemic Control  Diabetes history:No Outpatient Diabetes medications: NA Current orders for Inpatient glycemic control: None  Inpatient Diabetes Program Recommendations: Correction (SSI):Over the past 4 days, lab glucose has ranged from 225-431 mg/dl. While inpatient and ordered steroids, please consider ordering CBGs with Novolog correction scale ACHS.  Thanks, Barnie Alderman, RN, MSN, CDE Diabetes Coordinator Inpatient Diabetes Program [phone] (Team Pager from 8am to 5pm)

## 2017-02-07 NOTE — Care Management Important Message (Signed)
Important Message  Patient Details  Name: Victoria Burke MRN: [id number] Date of Birth: 03/05/1951   Medicare Important Message Given:  Yes    [name] 02/07/2017, 12:42 PM

## 2017-02-07 NOTE — Plan of Care (Signed)
Problem: Health Behavior/Discharge Planning: Goal: Ability to manage health-related needs will improve Outcome: Progressing Pt has verbalized understanding of medications and importance of keeping regiment. Pt still complaining of SOB and DOE; will continue to monitor improvement.   Problem: Physical Regulation: Goal: Ability to maintain clinical measurements within normal limits will improve Outcome: Progressing Pt has verbalized that her SOB has improved slightly but at some points it remains to the same degrees as it was when she was admitted. Will continue to monitor for improvement and monitor pt's O2 saturation closely.

## 2017-02-07 NOTE — Progress Notes (Signed)
Page to Dr Wendee Beavers:   Victoria Burke. CBG 472.  Please advise special dosing/further orders.   Per Dr Wendee Beavers, Proceed with dose of 20u of insulin, and monitor.

## 2017-02-07 NOTE — Progress Notes (Signed)
PROGRESS NOTE  Victoria Burke:[id number] DOB: 04/10/1951 DOA: 02/03/2017 PCP: Dorena Dew, FNP  Brief summary:  Pt presenting w/ 3 day h/o progressive SOB. Initially intermittent but now constant. Associated w/ wheezing and productive cough w/ white sputum. Home inhalers w/o benefit. Denies fevers, CP, Palpitations, n/v/diarrhea, abdominal pain, dysuria, frequency, back pain, LE swelling, unintentional wt gain or loss. Pt states that the night before admission she needed to sleep sitting up.   HPI/Recap of past 24 hours: Still sob above baseline. But reports improvement in condition.  Assessment/Plan: Active Problems:   Breast cancer of upper-outer quadrant of left female breast (HCC)   Bladder mass   Acute pulmonary edema (HCC)   COPD exacerbation (HCC)   Tobacco abuse   Acute respiratory failure with hypoxemia (HCC)   Chronic diastolic CHF (congestive heart failure) (HCC)   Acute hypoxemic respiratory failure (HCC)   Acute respiratory failure with hypoxia. 84% on RA in ED. Exacerbation of COPD 2ary to viral etiology most likely -Likely from COPD exacerbation w/ possible mild CHF.   - respiratory viral panel + Rhinovirus,  Urine strep pneuno antigen negative, mrsa screening negative, sputum culture pending collection, Procalcitonin < 0.1 -continue duonebs,  continue current Solu-Medrol dose, doxycycline -wean o2 /steroids as tolerated  Pleural effusion: R>L -She does not have edema, cr elevated after lasix. Clinically dry -Review previous ct /cta chest, patient started to have bilateral pleural effusion R>L since 08/2016 -CTA chest repeated and reporting chronic effusions. Reported small last x-ray   Chronic Diastolic CHF:  -Last Echo from 07/2016 w/ 81% EF and diastolic dysfunction noted. No LE edema, Wt gain. Medical non-compliance w/ diuretic.  - BNP elevated at 1567, troponin negative,  - lasix 40 IV x2 given by EDP. Cr elevated, clinically does not look fluids  overloaded.  - hold lasix on 7/31, Restarted home lasix regimen. - I/O, Daily weights. Repeat echocardiogram.  AKI on CKD III - Cr 1.08 on presentation, cr 1.70 on last check, reassess next am. - Will continue to hold lisinopril, d/c levaquin. - Renal dosing meds - Monitor S creatinine while on lasix  Hypokalemia/hypomagnesemia: replaced and WNL on last check.  NSVT: continue ccb, keep k>4, mag >2. Echo reporting ef of 55-60%, continue tele. - resolved  PAF:   -Currently sinus with frequent pvc's. No anticoagulation because of compliance. Polysubstance abuse per cardiology office note. - Continue with diltiazem. Avoiding beta blocker because of cocaine use.  HTN: taking medications sporadicaly -currently on norvasc/hydralazine, hold lisinopril, d/c HCTZ ( should not be on lasix and hctz)  Hepatitis C: not treated, likely due to current polysubstance abuse  H/o Breast cancer and bladder Cancer: - continue Arimidex (restarted in 11/2016, lost follow up from 2016 till 11/2016)  Chronic pain: - stable on neurontin (renal dosing)  Depression:  - stable on Buspar  Insomnia: - continue trazodone  Tobacco use: 2-3 cigarettes per day - Nicotine replacement gum prn ordered on admission  Polysubstance abuse: cocaine/alcohol Report self referred to outpatient treatment uds on admission +cocaine Last drink reported a few weeks ago  DVT prophylaxis: Hep  Code Status: full  Family Communication: none  Disposition Plan:  consider d/c next am with continued improvement in respiratory condition. Consults called: none    Procedures:  none  Antibiotics:  levaquin on 7/30  Doxycycline from 7/31   Objective: BP (!) 171/79 (BP Location: Right Arm) Comment: notified nurse  Pulse 79   Temp 98.1 F (36.7 C) (Oral)  Resp 18   Ht 5\' 2"  (1.575 m)   Wt 56.2 kg (124 lb) Comment: scale c  SpO2 95%   BMI 22.68 kg/m   Intake/Output Summary (Last 24 hours) at 02/07/17  1630 Last data filed at 02/06/17 1700  Gross per 24 hour  Intake              240 ml  Output              600 ml  Net             -360 ml   Filed Weights   02/05/17 0406 02/06/17 0534 02/07/17 0420  Weight: 55.5 kg (122 lb 5.7 oz) 54.8 kg (120 lb 12.8 oz) 56.2 kg (124 lb)    Exam:   General:   Alert and awake, NAD  Cardiovascular: RRR, no gallops or rubs  Respiratory: no wheezes, equal chest rise, no rales, Prolonged expiratory phase  Abdomen: Soft/ND/NT, positive BS  Musculoskeletal: No Edema, equal tone  Neuro: aaox3, no facial asymmetry  Data Reviewed: Basic Metabolic Panel:  Recent Labs Lab 02/03/17 1046 02/04/17 0343 02/04/17 0700 02/05/17 0445 02/06/17 0324 02/07/17 0458  NA 139 138  --  138 134* 133*  K 3.8 3.1*  --  3.6 3.8 3.4*  CL 104 106  --  105 106 105  CO2 25 20*  --  22 20* 20*  GLUCOSE 108* 225*  --  285* 395* 431*  BUN 8 23*  --  33* 44* 55*  CREATININE 1.08* 1.74*  --  1.99* 1.70* 1.96*  CALCIUM 9.4 8.2*  --  8.7* 9.0 8.8*  MG  --   --  1.5* 2.2  --   --    Liver Function Tests:  Recent Labs Lab 02/05/17 0445  AST 19  ALT 15  ALKPHOS 83  BILITOT 0.4  PROT 7.9  ALBUMIN 2.2*   No results for input(s): LIPASE, AMYLASE in the last 168 hours. No results for input(s): AMMONIA in the last 168 hours. CBC:  Recent Labs Lab 02/03/17 1046 02/04/17 0343 02/05/17 0445  WBC 3.3* 3.9* 13.1*  HGB 15.0 12.4 12.7  HCT 47.3* 38.0 39.4  MCV 88.6 87.6 88.1  PLT 342 391 422*   Cardiac Enzymes:   No results for input(s): CKTOTAL, CKMB, CKMBINDEX, TROPONINI in the last 168 hours. BNP (last 3 results)  Recent Labs  08/26/16 1101 10/15/16 1815 02/03/17 1046  BNP 843.9* 651.5* 1,567.3*    ProBNP (last 3 results) No results for input(s): PROBNP in the last 8760 hours.  CBG:  Recent Labs Lab 02/07/17 1131 02/07/17 1321  GLUCAP 472* 484*    Recent Results (from the past 240 hour(s))  Respiratory Panel by PCR     Status: Abnormal     Collection Time: 02/04/17  8:00 AM  Result Value Ref Range Status   Adenovirus NOT DETECTED NOT DETECTED Final   Coronavirus 229E NOT DETECTED NOT DETECTED Final   Coronavirus HKU1 NOT DETECTED NOT DETECTED Final   Coronavirus NL63 NOT DETECTED NOT DETECTED Final   Coronavirus OC43 NOT DETECTED NOT DETECTED Final   Metapneumovirus NOT DETECTED NOT DETECTED Final   Rhinovirus / Enterovirus DETECTED (A) NOT DETECTED Final   Influenza A NOT DETECTED NOT DETECTED Final   Influenza B NOT DETECTED NOT DETECTED Final   Parainfluenza Virus 1 NOT DETECTED NOT DETECTED Final   Parainfluenza Virus 2 NOT DETECTED NOT DETECTED Final   Parainfluenza Virus 3 NOT DETECTED NOT DETECTED Final  Parainfluenza Virus 4 NOT DETECTED NOT DETECTED Final   Respiratory Syncytial Virus NOT DETECTED NOT DETECTED Final   Bordetella pertussis NOT DETECTED NOT DETECTED Final   Chlamydophila pneumoniae NOT DETECTED NOT DETECTED Final   Mycoplasma pneumoniae NOT DETECTED NOT DETECTED Final  MRSA PCR Screening     Status: None   Collection Time: 02/04/17  8:00 AM  Result Value Ref Range Status   MRSA by PCR NEGATIVE NEGATIVE Final    Comment:        The GeneXpert MRSA Assay (FDA approved for NASAL specimens only), is one component of a comprehensive MRSA colonization surveillance program. It is not intended to diagnose MRSA infection nor to guide or monitor treatment for MRSA infections.      Studies: No results found.  Scheduled Meds: . amLODipine  10 mg Oral Daily  . anastrozole  1 mg Oral Daily  . aspirin EC  81 mg Oral Daily  . busPIRone  10 mg Oral BID  . diltiazem  240 mg Oral Daily  . doxycycline  100 mg Oral Q12H  . furosemide  40 mg Oral Daily  . gabapentin  200 mg Oral TID  . guaiFENesin  1,200 mg Oral BID  . heparin subcutaneous  5,000 Units Subcutaneous Q8H  . hydrALAZINE  25 mg Oral TID  . insulin aspart  0-20 Units Subcutaneous TID WC  . insulin aspart  0-5 Units Subcutaneous QHS   . ipratropium-albuterol  3 mL Nebulization TID  . methylPREDNISolone sodium succinate  80 mg Intravenous Q12H    Continuous Infusions:   Time spent: > 35 minutes  Velvet Bathe MD, PhD  [facility] Pager [phone]. If 7PM-7AM, please contact night-coverage at [email or web address], password [facility] 02/07/2017, 4:30 PM  LOS: 3 days

## 2017-02-08 LAB — HEMOGLOBIN A1C
HEMOGLOBIN A1C: 6.1 % — AB (ref 4.8–5.6)
MEAN PLASMA GLUCOSE: 128 mg/dL

## 2017-02-08 LAB — EXPECTORATED SPUTUM ASSESSMENT W REFEX TO RESP CULTURE

## 2017-02-08 LAB — GLUCOSE, CAPILLARY
GLUCOSE-CAPILLARY: 127 mg/dL — AB (ref 65–99)
GLUCOSE-CAPILLARY: 187 mg/dL — AB (ref 65–99)
GLUCOSE-CAPILLARY: 294 mg/dL — AB (ref 65–99)
Glucose-Capillary: 150 mg/dL — ABNORMAL HIGH (ref 65–99)

## 2017-02-08 LAB — EXPECTORATED SPUTUM ASSESSMENT W GRAM STAIN, RFLX TO RESP C

## 2017-02-08 MED ORDER — METHYLPREDNISOLONE SODIUM SUCC 125 MG IJ SOLR
60.0000 mg | INTRAMUSCULAR | Status: DC
  Administered 2017-02-09 – 2017-02-10 (×2): 60 mg via INTRAVENOUS
  Filled 2017-02-08 (×2): qty 2

## 2017-02-08 NOTE — Progress Notes (Signed)
PROGRESS NOTE  Victoria Burke PYK:[id number] DOB: [birth year] DOA: 02/03/2017 PCP: Dorena Dew, FNP  Brief summary:  Pt presenting w/ 3 day h/o progressive SOB. Initially intermittent but now constant. Associated w/ wheezing and productive cough w/ white sputum. Home inhalers w/o benefit. Denies fevers, CP, Palpitations, n/v/diarrhea, abdominal pain, dysuria, frequency, back pain, LE swelling, unintentional wt gain or loss. Pt states that the night before admission she needed to sleep sitting up.   HPI/Recap of past 24 hours: Patient reports improvement in condition but states she is not baseline still.  Assessment/Plan: Active Problems:   Breast cancer of upper-outer quadrant of left female breast (HCC)   Bladder mass   Acute pulmonary edema (HCC)   COPD exacerbation (HCC)   Tobacco abuse   Acute respiratory failure with hypoxemia (HCC)   Chronic diastolic CHF (congestive heart failure) (HCC)   Acute hypoxemic respiratory failure (HCC)   Acute respiratory failure with hypoxia. 84% on RA in ED. Exacerbation of COPD 2ary to viral etiology most likely -Likely from COPD exacerbation w/ possible mild CHF.   - respiratory viral panel + Rhinovirus,  Urine strep pneuno antigen negative, mrsa screening negative, sputum culture pending collection, Procalcitonin < 0.1 -continue duonebs,  given improvement will decrease Solu-Medrol dose, doxycycline day 6 of 7 -wean o2 /steroids as tolerated  Pleural effusion: R>L -She does not have edema, cr elevated after lasix. Clinically dry -Review previous ct /cta chest, patient started to have bilateral pleural effusion R>L since 08/2016 -CTA chest repeated and reporting chronic effusions. Reported small last x-ray   Chronic Diastolic CHF:  -Last Echo from 07/2016 w/ 53% EF and diastolic dysfunction noted. No LE edema, Wt gain. Medical non-compliance w/ diuretic.  - BNP elevated at 1567, troponin negative,  - lasix 40 IV x2 given by EDP. Cr  elevated, clinically still does not look fluids overloaded.  - hold lasix on 7/31, Restarted home lasix regimen. - I/O, Daily weights. Repeat echocardiogram.  AKI on CKD III - Cr 1.08 on presentation, cr 1.70 on last check, reassess next am. - Will continue to hold lisinopril - Renal dosing meds - Monitor S creatinine while on lasix  Hypokalemia/hypomagnesemia: replaced and WNL on last check.  NSVT: continue ccb, keep k>4, mag >2. Echo reporting ef of 55-60%, continue tele. - resolved  PAF:   -Currently sinus with frequent pvc's. No anticoagulation because of compliance. Polysubstance abuse per cardiology office note. - Continue with diltiazem. Continue to avoid beta blocker because of cocaine use.  HTN: taking medications sporadicaly -currently on norvasc/hydralazine, hold lisinopril, d/c HCTZ ( should not be on lasix and hctz)  Hepatitis C: not treated, likely due to current polysubstance abuse  H/o Breast cancer and bladder Cancer: - continue Arimidex (restarted in 11/2016, lost follow up from 2016 till 11/2016)  Chronic pain: - stable on neurontin (renal dosing)  Depression:  - stable on Buspar  Insomnia: - continue trazodone  Tobacco use: 2-3 cigarettes per day - Nicotine replacement gum prn ordered on admission  Polysubstance abuse: cocaine/alcohol Report self referred to outpatient treatment uds on admission +cocaine Last drink reported a few weeks ago  DVT prophylaxis: Hep  Code Status: full  Family Communication: none  Disposition Plan:  consider d/c next am with continued improvement in respiratory condition. Consults called: none    Procedures:  none  Antibiotics:  Doxycycline from 7/31   Objective: BP (!) 168/93 (BP Location: Right Arm)   Pulse 90   Temp 98.2 F (36.8  C) (Oral)   Resp 16   Ht 5\' 2"  (1.575 m)   Wt 56.8 kg (125 lb 4.8 oz)   SpO2 96%   BMI 22.92 kg/m   Intake/Output Summary (Last 24 hours) at 02/08/17 1512 Last  data filed at 02/08/17 1328  Gross per 24 hour  Intake              600 ml  Output              500 ml  Net              100 ml   Filed Weights   02/06/17 0534 02/07/17 0420 02/08/17 0600  Weight: 54.8 kg (120 lb 12.8 oz) 56.2 kg (124 lb) 56.8 kg (125 lb 4.8 oz)    Exam:Exam unchanged when compared to 02/07/2017   General:   Alert and awake, NAD  Cardiovascular: RRR, no gallops or rubs  Respiratory: no wheezes, equal chest rise, no rales, Prolonged expiratory phase  Abdomen: Soft/ND/NT, positive BS  Musculoskeletal: No Edema, equal tone  Neuro: aaox3, no facial asymmetry  Data Reviewed: Basic Metabolic Panel:  Recent Labs Lab 02/03/17 1046 02/04/17 0343 02/04/17 0700 02/05/17 0445 02/06/17 0324 02/07/17 0458  NA 139 138  --  138 134* 133*  K 3.8 3.1*  --  3.6 3.8 3.4*  CL 104 106  --  105 106 105  CO2 25 20*  --  22 20* 20*  GLUCOSE 108* 225*  --  285* 395* 431*  BUN 8 23*  --  33* 44* 55*  CREATININE 1.08* 1.74*  --  1.99* 1.70* 1.96*  CALCIUM 9.4 8.2*  --  8.7* 9.0 8.8*  MG  --   --  1.5* 2.2  --   --    Liver Function Tests:  Recent Labs Lab 02/05/17 0445  AST 19  ALT 15  ALKPHOS 83  BILITOT 0.4  PROT 7.9  ALBUMIN 2.2*   No results for input(s): LIPASE, AMYLASE in the last 168 hours. No results for input(s): AMMONIA in the last 168 hours. CBC:  Recent Labs Lab 02/03/17 1046 02/04/17 0343 02/05/17 0445  WBC 3.3* 3.9* 13.1*  HGB 15.0 12.4 12.7  HCT 47.3* 38.0 39.4  MCV 88.6 87.6 88.1  PLT 342 391 422*   Cardiac Enzymes:   No results for input(s): CKTOTAL, CKMB, CKMBINDEX, TROPONINI in the last 168 hours. BNP (last 3 results)  Recent Labs  08/26/16 1101 10/15/16 1815 02/03/17 1046  BNP 843.9* 651.5* 1,567.3*    ProBNP (last 3 results) No results for input(s): PROBNP in the last 8760 hours.  CBG:  Recent Labs Lab 02/07/17 1321 02/07/17 1641 02/07/17 2146 02/08/17 0724 02/08/17 1127  GLUCAP 484* 335* 149* 127* 187*     Recent Results (from the past 240 hour(s))  Respiratory Panel by PCR     Status: Abnormal   Collection Time: 02/04/17  8:00 AM  Result Value Ref Range Status   Adenovirus NOT DETECTED NOT DETECTED Final   Coronavirus 229E NOT DETECTED NOT DETECTED Final   Coronavirus HKU1 NOT DETECTED NOT DETECTED Final   Coronavirus NL63 NOT DETECTED NOT DETECTED Final   Coronavirus OC43 NOT DETECTED NOT DETECTED Final   Metapneumovirus NOT DETECTED NOT DETECTED Final   Rhinovirus / Enterovirus DETECTED (A) NOT DETECTED Final   Influenza A NOT DETECTED NOT DETECTED Final   Influenza B NOT DETECTED NOT DETECTED Final   Parainfluenza Virus 1 NOT DETECTED NOT DETECTED Final  Parainfluenza Virus 2 NOT DETECTED NOT DETECTED Final   Parainfluenza Virus 3 NOT DETECTED NOT DETECTED Final   Parainfluenza Virus 4 NOT DETECTED NOT DETECTED Final   Respiratory Syncytial Virus NOT DETECTED NOT DETECTED Final   Bordetella pertussis NOT DETECTED NOT DETECTED Final   Chlamydophila pneumoniae NOT DETECTED NOT DETECTED Final   Mycoplasma pneumoniae NOT DETECTED NOT DETECTED Final  MRSA PCR Screening     Status: None   Collection Time: 02/04/17  8:00 AM  Result Value Ref Range Status   MRSA by PCR NEGATIVE NEGATIVE Final    Comment:        The GeneXpert MRSA Assay (FDA approved for NASAL specimens only), is one component of a comprehensive MRSA colonization surveillance program. It is not intended to diagnose MRSA infection nor to guide or monitor treatment for MRSA infections.   Culture, expectorated sputum-assessment     Status: None   Collection Time: 02/08/17  9:42 AM  Result Value Ref Range Status   Specimen Description EXPECTORATED SPUTUM  Final   Special Requests NONE  Final   Sputum evaluation   Final    Sputum specimen not acceptable for testing.  Please recollect.   Gram Stain Report Called to,Read Back By and Verified With: A TOBIAS DIAKEN,RN AT 1132 02/08/17 BY L BENFIELD    Report Status  02/08/2017 FINAL  Final     Studies: No results found.  Scheduled Meds: . amLODipine  10 mg Oral Daily  . anastrozole  1 mg Oral Daily  . aspirin EC  81 mg Oral Daily  . busPIRone  10 mg Oral BID  . diltiazem  240 mg Oral Daily  . doxycycline  100 mg Oral Q12H  . furosemide  40 mg Oral Daily  . gabapentin  200 mg Oral TID  . guaiFENesin  1,200 mg Oral BID  . heparin subcutaneous  5,000 Units Subcutaneous Q8H  . hydrALAZINE  25 mg Oral TID  . insulin aspart  0-20 Units Subcutaneous TID WC  . insulin aspart  0-5 Units Subcutaneous QHS  . ipratropium-albuterol  3 mL Nebulization TID  . [START ON 02/09/2017] methylPREDNISolone sodium succinate  60 mg Intravenous Q24H    Continuous Infusions:   Time spent: > 35 minutes  Velvet Bathe MD, PhD  [facility] Pager [phone]. If 7PM-7AM, please contact night-coverage at [email or web address], password [email or web address] 02/08/2017, 3:12 PM  LOS: 4 days

## 2017-02-09 LAB — GLUCOSE, CAPILLARY
GLUCOSE-CAPILLARY: 255 mg/dL — AB (ref 65–99)
Glucose-Capillary: 175 mg/dL — ABNORMAL HIGH (ref 65–99)
Glucose-Capillary: 253 mg/dL — ABNORMAL HIGH (ref 65–99)
Glucose-Capillary: 235 mg/dL — ABNORMAL HIGH (ref 65–99)

## 2017-02-09 MED ORDER — TIOTROPIUM BROMIDE MONOHYDRATE 18 MCG IN CAPS
18.0000 ug | ORAL_CAPSULE | Freq: Every day | RESPIRATORY_TRACT | Status: DC
  Administered 2017-02-10: 18 ug via RESPIRATORY_TRACT
  Filled 2017-02-09: qty 5

## 2017-02-09 NOTE — Progress Notes (Signed)
PROGRESS NOTE  Victoria Burke JQZ:[id number] DOB: [birth year] DOA: 02/03/2017 PCP: Dorena Dew, FNP  Brief summary:  Pt presenting w/ 3 day h/o progressive SOB. Initially intermittent but now constant. Associated w/ wheezing and productive cough w/ white sputum. Home inhalers w/o benefit. Denies fevers, CP, Palpitations, n/v/diarrhea, abdominal pain, dysuria, frequency, back pain, LE swelling, unintentional wt gain or loss. Pt states that the night before admission she needed to sleep sitting up.   HPI/Recap of past 24 hours: Patient reports improvement in condition but states she is not baseline still.  Assessment/Plan: Active Problems:   Breast cancer of upper-outer quadrant of left female breast (HCC)   Bladder mass   Acute pulmonary edema (HCC)   COPD exacerbation (HCC)   Tobacco abuse   Acute respiratory failure with hypoxemia (HCC)   Chronic diastolic CHF (congestive heart failure) (HCC)   Acute hypoxemic respiratory failure (HCC)   Acute respiratory failure with hypoxia. 84% on RA in ED. Exacerbation of COPD 2ary to viral etiology most likely -Likely from COPD exacerbation w/ possible mild CHF.   - respiratory viral panel + Rhinovirus,  Urine strep pneuno antigen negative, mrsa screening negative, sputum culture pending collection, Procalcitonin < 0.1 -continue duonebs,  given improvement will decrease Solu-Medrol dose, doxycycline day 6 of 7 -wean o2 /steroids as tolerated - still not much improvement will start on trial of spiriva and reassess.   Pleural effusion: R>L -She does not have edema, cr elevated after lasix. Clinically dry -Review previous ct /cta chest, patient started to have bilateral pleural effusion R>L since 08/2016 -CTA chest repeated and reporting chronic effusions.  - Reported small last x-ray   Chronic Diastolic CHF:  -Last Echo from 07/2016 w/ 62% EF and diastolic dysfunction noted. No LE edema, Wt gain. Medical non-compliance w/ diuretic.  -  BNP elevated at 1567, troponin negative,  - lasix 40 IV x2 given by EDP. Cr elevated, clinically still does not look fluids overloaded.  - hold lasix on 7/31, Restarted home lasix regimen. - I/O is net negative 860, Daily weights. Repeat echocardiogram reporting an EF of 55 to 60%.  AKI on CKD III - Cr 1.08 on presentation, cr 1.70 on last check, reassess next am. - Will continue to hold lisinopril - Renal dosing meds - Monitor S creatinine while on lasix  Hypokalemia/hypomagnesemia: replaced and WNL on last check.  NSVT: continue ccb, keep k>4, mag >2. Echo reporting ef of 55-60%, continue tele. - resolved  PAF:   -Currently sinus with frequent pvc's. No anticoagulation because of compliance. Polysubstance abuse per cardiology office note. - Continue with diltiazem. Continue to avoid beta blocker because of cocaine use.  HTN: taking medications sporadicaly -currently on norvasc/hydralazine, hold lisinopril, d/c HCTZ ( should not be on lasix and hctz)  Hepatitis C: not treated, likely due to current polysubstance abuse  H/o Breast cancer and bladder Cancer: - continue Arimidex (restarted in 11/2016, lost follow up from 2016 till 11/2016)  Chronic pain: - stable on neurontin (renal dosing)  Depression:  - stable on Buspar  Insomnia: - continue trazodone  Tobacco use: 2-3 cigarettes per day - Nicotine replacement gum prn ordered on admission  Polysubstance abuse: cocaine/alcohol Report self referred to outpatient treatment uds on admission +cocaine Last drink reported a few weeks ago  DVT prophylaxis: Hep  Code Status: full  Family Communication: none  Disposition Plan:  not much improvement. Will start on trial of spiriva Consults called: none    Procedures:  none  Antibiotics:  Doxycycline from 7/31   Objective: BP 103/81 (BP Location: Right Arm)   Pulse (!) 109   Temp 97.8 F (36.6 C) (Oral)   Resp 20   Ht 5\' 2"  (1.575 m)   Wt 57.2 kg (126 lb)    SpO2 96%   BMI 23.05 kg/m   Intake/Output Summary (Last 24 hours) at 02/09/17 1248 Last data filed at 02/08/17 2100  Gross per 24 hour  Intake              480 ml  Output                0 ml  Net              480 ml   Filed Weights   02/07/17 0420 02/08/17 0600 02/09/17 0500  Weight: 56.2 kg (124 lb) 56.8 kg (125 lb 4.8 oz) 57.2 kg (126 lb)    Exam:Exam unchanged when compared to 02/08/2017   General:   Alert and awake, NAD  Cardiovascular: RRR, no gallops or rubs  Respiratory: no wheezes, equal chest rise, no rales, Prolonged expiratory phase  Abdomen: Soft/ND/NT, positive BS  Musculoskeletal: No Edema, equal tone  Neuro: aaox3, no facial asymmetry  Data Reviewed: Basic Metabolic Panel:  Recent Labs Lab 02/03/17 1046 02/04/17 0343 02/04/17 0700 02/05/17 0445 02/06/17 0324 02/07/17 0458  NA 139 138  --  138 134* 133*  K 3.8 3.1*  --  3.6 3.8 3.4*  CL 104 106  --  105 106 105  CO2 25 20*  --  22 20* 20*  GLUCOSE 108* 225*  --  285* 395* 431*  BUN 8 23*  --  33* 44* 55*  CREATININE 1.08* 1.74*  --  1.99* 1.70* 1.96*  CALCIUM 9.4 8.2*  --  8.7* 9.0 8.8*  MG  --   --  1.5* 2.2  --   --    Liver Function Tests:  Recent Labs Lab 02/05/17 0445  AST 19  ALT 15  ALKPHOS 83  BILITOT 0.4  PROT 7.9  ALBUMIN 2.2*   No results for input(s): LIPASE, AMYLASE in the last 168 hours. No results for input(s): AMMONIA in the last 168 hours. CBC:  Recent Labs Lab 02/03/17 1046 02/04/17 0343 02/05/17 0445  WBC 3.3* 3.9* 13.1*  HGB 15.0 12.4 12.7  HCT 47.3* 38.0 39.4  MCV 88.6 87.6 88.1  PLT 342 391 422*   Cardiac Enzymes:   No results for input(s): CKTOTAL, CKMB, CKMBINDEX, TROPONINI in the last 168 hours. BNP (last 3 results)  Recent Labs  08/26/16 1101 10/15/16 1815 02/03/17 1046  BNP 843.9* 651.5* 1,567.3*    ProBNP (last 3 results) No results for input(s): PROBNP in the last 8760 hours.  CBG:  Recent Labs Lab 02/08/17 1127  02/08/17 1553 02/08/17 2055 02/09/17 0723 02/09/17 1158  GLUCAP 187* 294* 150* 175* 253*    Recent Results (from the past 240 hour(s))  Respiratory Panel by PCR     Status: Abnormal   Collection Time: 02/04/17  8:00 AM  Result Value Ref Range Status   Adenovirus NOT DETECTED NOT DETECTED Final   Coronavirus 229E NOT DETECTED NOT DETECTED Final   Coronavirus HKU1 NOT DETECTED NOT DETECTED Final   Coronavirus NL63 NOT DETECTED NOT DETECTED Final   Coronavirus OC43 NOT DETECTED NOT DETECTED Final   Metapneumovirus NOT DETECTED NOT DETECTED Final   Rhinovirus / Enterovirus DETECTED (A) NOT DETECTED Final   Influenza A NOT DETECTED NOT  DETECTED Final   Influenza B NOT DETECTED NOT DETECTED Final   Parainfluenza Virus 1 NOT DETECTED NOT DETECTED Final   Parainfluenza Virus 2 NOT DETECTED NOT DETECTED Final   Parainfluenza Virus 3 NOT DETECTED NOT DETECTED Final   Parainfluenza Virus 4 NOT DETECTED NOT DETECTED Final   Respiratory Syncytial Virus NOT DETECTED NOT DETECTED Final   Bordetella pertussis NOT DETECTED NOT DETECTED Final   Chlamydophila pneumoniae NOT DETECTED NOT DETECTED Final   Mycoplasma pneumoniae NOT DETECTED NOT DETECTED Final  MRSA PCR Screening     Status: None   Collection Time: 02/04/17  8:00 AM  Result Value Ref Range Status   MRSA by PCR NEGATIVE NEGATIVE Final    Comment:        The GeneXpert MRSA Assay (FDA approved for NASAL specimens only), is one component of a comprehensive MRSA colonization surveillance program. It is not intended to diagnose MRSA infection nor to guide or monitor treatment for MRSA infections.   Culture, expectorated sputum-assessment     Status: None   Collection Time: 02/08/17  9:42 AM  Result Value Ref Range Status   Specimen Description EXPECTORATED SPUTUM  Final   Special Requests NONE  Final   Sputum evaluation   Final    Sputum specimen not acceptable for testing.  Please recollect.   Gram Stain Report Called to,Read  Back By and Verified With: A TOBIAS DIAKEN,RN AT 1132 02/08/17 BY L BENFIELD    Report Status 02/08/2017 FINAL  Final     Studies: No results found.  Scheduled Meds: . amLODipine  10 mg Oral Daily  . anastrozole  1 mg Oral Daily  . aspirin EC  81 mg Oral Daily  . busPIRone  10 mg Oral BID  . diltiazem  240 mg Oral Daily  . doxycycline  100 mg Oral Q12H  . furosemide  40 mg Oral Daily  . gabapentin  200 mg Oral TID  . guaiFENesin  1,200 mg Oral BID  . heparin subcutaneous  5,000 Units Subcutaneous Q8H  . hydrALAZINE  25 mg Oral TID  . insulin aspart  0-20 Units Subcutaneous TID WC  . insulin aspart  0-5 Units Subcutaneous QHS  . methylPREDNISolone sodium succinate  60 mg Intravenous Q24H  . tiotropium  18 mcg Inhalation Daily    Continuous Infusions:   Time spent: > 35 minutes  Velvet Bathe MD, PhD  [facility] Pager [phone]. If 7PM-7AM, please contact night-coverage at [email or web address], password [facility] 02/09/2017, 12:48 PM  LOS: 5 days

## 2017-02-10 LAB — GLUCOSE, CAPILLARY
GLUCOSE-CAPILLARY: 158 mg/dL — AB (ref 65–99)
Glucose-Capillary: 206 mg/dL — ABNORMAL HIGH (ref 65–99)

## 2017-02-10 MED ORDER — TIOTROPIUM BROMIDE MONOHYDRATE 18 MCG IN CAPS: 18.0000 ug | ORAL_CAPSULE | Freq: Every day | RESPIRATORY_TRACT | 0 refills | Status: DC

## 2017-02-10 MED ORDER — POTASSIUM CHLORIDE CRYS ER 20 MEQ PO TBCR
40.0000 meq | EXTENDED_RELEASE_TABLET | Freq: Once | ORAL | Status: AC
  Administered 2017-02-10: 40 meq via ORAL
  Filled 2017-02-10: qty 2

## 2017-02-10 MED ORDER — PREDNISONE 10 MG PO TABS: 10.0000 mg | ORAL_TABLET | ORAL | 0 refills | Status: DC

## 2017-02-10 NOTE — Progress Notes (Signed)
Patient given discharge instructions and all questions answered.  

## 2017-02-10 NOTE — Discharge Summary (Signed)
Physician Discharge Summary  Victoria Burke YIF:[id number] DOB: 1950-10-28 DOA: 02/03/2017  PCP: Dorena Dew, FNP  Admit date: 02/03/2017 Discharge date: 02/10/2017  Time spent: > 35 minutes  Recommendations for Outpatient Follow-up:  1. Monitor serum creatinine 2. Improved on spiriva 3. Monitor blood sugars   Discharge Diagnoses:  Active Problems:   Breast cancer of upper-outer quadrant of left female breast [facility])   Bladder mass   Acute pulmonary edema (HCC)   COPD exacerbation (HCC)   Tobacco abuse   Acute respiratory failure with hypoxemia (HCC)   Chronic diastolic CHF (congestive heart failure) ([place])   Acute hypoxemic respiratory failure ([place])   Discharge Condition: stable  Diet recommendation: Carb modified diet  Filed Weights   02/08/17 0600 02/09/17 0500 02/10/17 0603  Weight: 56.8 kg (125 lb 4.8 oz) 57.2 kg (126 lb) 57.2 kg (126 lb 3.2 oz)    History of present illness:  Pt presenting w/ 3 day h/o progressive SOB. Initially intermittent but now constant. Associated w/ wheezing and productive cough w/ white sputum. Home inhalers w/o benefit. Denies fevers, CP, Palpitations, n/v/diarrhea, abdominal pain, dysuria, frequency, back pain, LE swelling, unintentional wt gain or loss. Pt states that the night before admission she needed to sleep sitting up  Hospital Course:   Acute respiratory failure with hypoxia. 84% on RA in ED. Exacerbation of COPD 2ary to viral etiology most likely -Likely from COPD exacerbation w/ possible mild CHF.   - respiratory viral panel + Rhinovirus,  Urine strep pneuno antigen negative, mrsa screening negative, sputum culture pending collection, Procalcitonin < 0.1 -continue albuterol prn,  improved on solumedrol, will transition to prednisone and discharge on taper.  Completed 7 days of antibiotics -breathing comfortably on room air. - d/c on spiriva as patient had improvement on this regimen.  Pleural effusion: R>L -She does not have  edema, cr elevated after lasix. Clinically dry -Review previous ct /cta chest, patient started to have bilateral pleural effusion R>L since 08/2016 -CTA chest repeated and reporting chronic effusions.  - Reported small last x-ray   Chronic Diastolic CHF:  -Last Echo from 07/2016 w/ 86% EF and diastolic dysfunction noted. No LE edema, Wt gain. Medical non-compliance w/ diuretic.  - BNP elevated at 1567, troponin negative, Was given IV lasix  - Restarted home lasix regimen. - I/O is net negative 860, Daily weights. Repeat echocardiogram reporting an EF of 55 to 60%.  Hyperglycemia - most likely due to corticosteroid use. Will recommend diabetic diet on discharge patient has prediabetic range hemoglobin A1c is 6.1  AKI on CKD III - Cr 1.08 on presentation, cr 1.90 on last check - Will continue to hold lisinopril - Renal dosing meds - Monitor S creatinine as outpatient.  Hypokalemia/hypomagnesemia:  - K mildly low at 3.4 will administer K dur on d/c  NSVT: continue ccb, no b blockers due to recent cocaine on uds - resolved  PAF:   -Currently sinus with frequent pvc's. No anticoagulation because of compliance. Polysubstance abuse per cardiology office note. - Continue with diltiazem. Continue to avoid beta blocker because of cocaine use.  HTN: taking medications sporadicaly -currently on norvasc/hydralazine, hold lisinopril, d/c HCTZ ( should not be on lasix and hctz)  Hepatitis C: not treated, likely due to current polysubstance abuse  H/o Breast cancer and bladder Cancer: - continue Arimidex (restarted in 11/2016, lost follow up from 2016 till 11/2016)  Chronic pain: - stable on neurontin (renal dosing)  Depression:  - stable on Buspar  Insomnia: -  continue trazodone  Tobacco use: 2-3 cigarettes per day - Nicotine replacement gum prn ordered on admission  Polysubstance abuse: cocaine/alcohol Report self referred to outpatient treatment uds on admission  +cocaine Last drink reported a few weeks ago  Procedures:  none  Consultations:  none  Discharge Exam: Vitals:   02/10/17 0603 02/10/17 1259  BP: (!) 156/89 (!) 173/95  Pulse: (!) 59 78  Resp:    Temp: 98.2 F (36.8 C)     General: Pt in nad, alert and awake Cardiovascular: rrr, no rubs Respiratory: no increased wob, no wheezes  Discharge Instructions   Discharge Instructions    Call MD for:  difficulty breathing, headache or visual disturbances    Complete by:  As directed    Call MD for:  temperature >100.4    Complete by:  As directed    Diet - low sodium heart healthy    Complete by:  As directed    Discharge instructions    Complete by:  As directed    Increase activity slowly    Complete by:  As directed      Current Discharge Medication List    START taking these medications   Details  predniSONE (DELTASONE) 10 MG tablet Take 1 tablet (10 mg total) by mouth as directed. Take 40 mg by mouth daily for the next 3 days, then take 30 mg by mouth daily for the next 3 days, then take 20 mg by the mouth for the next 3 days, then take 10 mg by mouth next 3 days. Qty: 30 tablet, Refills: 0    tiotropium (SPIRIVA) 18 MCG inhalation capsule Place 1 capsule (18 mcg total) into inhaler and inhale daily. Qty: 30 capsule, Refills: 0      CONTINUE these medications which have NOT CHANGED   Details  albuterol (PROVENTIL HFA;VENTOLIN HFA) 108 (90 Base) MCG/ACT inhaler Inhale 1-2 puffs into the lungs every 6 (six) hours as needed for wheezing or shortness of breath. Qty: 3 Inhaler, Refills: 3    amLODipine (NORVASC) 10 MG tablet Take 10 mg by mouth daily.    anastrozole (ARIMIDEX) 1 MG tablet Take 1 tablet (1 mg total) by mouth daily. Qty: 30 tablet, Refills: 5    aspirin EC 81 MG EC tablet Take 1 tablet (81 mg total) by mouth daily. Qty: 30 tablet, Refills: 1    busPIRone (BUSPAR) 10 MG tablet Take 1 tablet (10 mg total) by mouth 2 (two) times daily. Qty: 60  tablet, Refills: 2   Associated Diagnoses: Generalized anxiety disorder    gabapentin (NEURONTIN) 300 MG capsule Take 1 capsule (300 mg total) by mouth 3 (three) times daily. Qty: 90 capsule, Refills: 0   Associated Diagnoses: Essential hypertension    hydrALAZINE (APRESOLINE) 25 MG tablet Take 1 tablet (25 mg total) by mouth 3 (three) times daily. Qty: 90 tablet, Refills: 0    traZODone (DESYREL) 50 MG tablet Take 0.5 tablets (25 mg total) by mouth at bedtime as needed for sleep. Qty: 30 tablet, Refills: 0   Associated Diagnoses: Insomnia, unspecified type    diltiazem (CARDIZEM CD) 240 MG 24 hr capsule Take 1 capsule (240 mg total) by mouth daily. Qty: 30 capsule, Refills: 0    furosemide (LASIX) 40 MG tablet Take 1 tablet (40 mg total) by mouth daily. Qty: 30 tablet, Refills: 1      STOP taking these medications     lisinopril-hydrochlorothiazide (PRINZIDE,ZESTORETIC) 20-12.5 MG tablet      potassium chloride SA (  K-DUR,KLOR-CON) 20 MEQ tablet      saccharomyces boulardii (FLORASTOR) 250 MG capsule      Vitamin D, Ergocalciferol, (DRISDOL) 50000 units CAPS capsule        No Known Allergies    The results of significant diagnostics from this hospitalization (including imaging, microbiology, ancillary and laboratory) are listed below for reference.    Significant Diagnostic Studies: Dg Chest 2 View  Result Date: 02/03/2017 CLINICAL DATA:  Three days of cough and shortness of breath. History of COPD, breast malignancy treated with radiation therapy, current smoker. EXAM: CHEST  2 VIEW COMPARISON:  Chest x-ray of August 28, 2016 FINDINGS: The lungs are reasonably well inflated. There is mild volume loss on the right due to a small pleural effusion. There is a trace of pleural fluid on the left. There is no alveolar infiltrate. The cardiac silhouette is enlarged. The central pulmonary vascularity is mildly prominent. There is dense calcification in the wall of the thoracic  aorta. IMPRESSION: Chronic bronchitic changes with superimposed small right pleural effusion and trace left pleural effusion. The effusion on the right is larger. Stable cardiomegaly with mild central pulmonary vascular prominence suggests low-grade CHF. Electronically Signed   By: David  Martinique M.D.   On: 02/03/2017 12:02   Ct Chest Wo Contrast  Result Date: 02/05/2017 CLINICAL DATA:  Shortness of breath with wheezing and productive cough for 3 days. No chest pain. Pleural effusion. History of left breast cancer and bladder cancer. EXAM: CT CHEST WITHOUT CONTRAST TECHNIQUE: Multidetector CT imaging of the chest was performed following the standard protocol without IV contrast. COMPARISON:  Chest CT 10/17/2016, 08/27/2016 and 09/20/2015. FINDINGS: Cardiovascular: Extensive atherosclerosis of the aorta, great vessels and coronary arteries is again noted. No acute vascular findings are seen on noncontrast imaging. There is stable cardiac enlargement. No significant pericardial fluid or thickening. Mediastinum/Nodes: Multiple enlarged right axillary, mediastinal and hilar lymph nodes are again noted, similar to the most recent study and only mildly progressive from 2017.A right axillary node measures up to 13 mm on image 38 (previously 11 mm). AP window node measures 20 mm on image 56 (previously 19 mm). Surgical clips are present in the left axilla from previous axillary node dissection. The thyroid gland, trachea and esophagus demonstrate no significant findings. Lungs/Pleura: Chronic right-greater-than-left dependent pleural effusions have not significantly changed in volume. The right pleural effusion may be partially loculated. There is diffuse centrilobular and paraseptal emphysema with associated central airway thickening. Subpleural reticulation anteriorly in the lingula is attributed to prior radiation therapy. Chronic dependent pulmonary opacities appear unchanged from the most recent study, probably  chronic atelectasis. No evidence of acute superimposed infiltrate, endobronchial lesion or suspicious pulmonary nodule. Upper abdomen: The visualized upper abdomen appears stable without suspicious findings. Enlargement of the left adrenal gland appears grossly stable, but remains incompletely visualized. Musculoskeletal/Chest wall: There is no chest wall mass or suspicious osseous finding. IMPRESSION: 1. No acute chest findings demonstrated. 2. Stable chronic bilateral pleural effusions and bibasilar atelectasis. No superimposed infiltrate identified. 3. Underlying emphysema and central airway thickening. 4. Similar right axillary, mediastinal and hilar adenopathy. Chronicity favors a benign/reactive etiology. Metastatic breast cancer and lymphoproliferative process considered less likely. 5.  Aortic Atherosclerosis (ICD10-I70.0). Electronically Signed   By: Richardean Sale M.D.   On: 02/05/2017 10:39    Microbiology: Recent Results (from the past 240 hour(s))  Respiratory Panel by PCR     Status: Abnormal   Collection Time: 02/04/17  8:00 AM  Result Value Ref  Range Status   Adenovirus NOT DETECTED NOT DETECTED Final   Coronavirus 229E NOT DETECTED NOT DETECTED Final   Coronavirus HKU1 NOT DETECTED NOT DETECTED Final   Coronavirus NL63 NOT DETECTED NOT DETECTED Final   Coronavirus OC43 NOT DETECTED NOT DETECTED Final   Metapneumovirus NOT DETECTED NOT DETECTED Final   Rhinovirus / Enterovirus DETECTED (A) NOT DETECTED Final   Influenza A NOT DETECTED NOT DETECTED Final   Influenza B NOT DETECTED NOT DETECTED Final   Parainfluenza Virus 1 NOT DETECTED NOT DETECTED Final   Parainfluenza Virus 2 NOT DETECTED NOT DETECTED Final   Parainfluenza Virus 3 NOT DETECTED NOT DETECTED Final   Parainfluenza Virus 4 NOT DETECTED NOT DETECTED Final   Respiratory Syncytial Virus NOT DETECTED NOT DETECTED Final   Bordetella pertussis NOT DETECTED NOT DETECTED Final   Chlamydophila pneumoniae NOT DETECTED NOT  DETECTED Final   Mycoplasma pneumoniae NOT DETECTED NOT DETECTED Final  MRSA PCR Screening     Status: None   Collection Time: 02/04/17  8:00 AM  Result Value Ref Range Status   MRSA by PCR NEGATIVE NEGATIVE Final    Comment:        The GeneXpert MRSA Assay (FDA approved for NASAL specimens only), is one component of a comprehensive MRSA colonization surveillance program. It is not intended to diagnose MRSA infection nor to guide or monitor treatment for MRSA infections.   Culture, expectorated sputum-assessment     Status: None   Collection Time: 02/08/17  9:42 AM  Result Value Ref Range Status   Specimen Description EXPECTORATED SPUTUM  Final   Special Requests NONE  Final   Sputum evaluation   Final    Sputum specimen not acceptable for testing.  Please recollect.   Gram Stain Report Called to,Read Back By and Verified With: A TOBIAS DIAKEN,RN AT 3716 02/08/17 BY L BENFIELD    Report Status 02/08/2017 FINAL  Final     Labs: Basic Metabolic Panel:  Recent Labs Lab 02/04/17 0343 02/04/17 0700 02/05/17 0445 02/06/17 0324 02/07/17 0458  NA 138  --  138 134* 133*  K 3.1*  --  3.6 3.8 3.4*  CL 106  --  105 106 105  CO2 20*  --  22 20* 20*  GLUCOSE 225*  --  285* 395* 431*  BUN 23*  --  33* 44* 55*  CREATININE 1.74*  --  1.99* 1.70* 1.96*  CALCIUM 8.2*  --  8.7* 9.0 8.8*  MG  --  1.5* 2.2  --   --    Liver Function Tests:  Recent Labs Lab 02/05/17 0445  AST 19  ALT 15  ALKPHOS 83  BILITOT 0.4  PROT 7.9  ALBUMIN 2.2*   No results for input(s): LIPASE, AMYLASE in the last 168 hours. No results for input(s): AMMONIA in the last 168 hours. CBC:  Recent Labs Lab 02/04/17 0343 02/05/17 0445  WBC 3.9* 13.1*  HGB 12.4 12.7  HCT 38.0 39.4  MCV 87.6 88.1  PLT 391 422*   Cardiac Enzymes: No results for input(s): CKTOTAL, CKMB, CKMBINDEX, TROPONINI in the last 168 hours. BNP: BNP (last 3 results)  Recent Labs  08/26/16 1101 10/15/16 1815 02/03/17 1046   BNP 843.9* 651.5* 1,567.3*    ProBNP (last 3 results) No results for input(s): PROBNP in the last 8760 hours.  CBG:  Recent Labs Lab 02/09/17 1158 02/09/17 1641 02/09/17 2056 02/10/17 0739 02/10/17 1213  GLUCAP 253* 255* 235* 158* 206*   Signed:  Faizon Capozzi,  Linward Foster MD.  [facility] 02/10/2017, 1:01 PM

## 2017-02-10 NOTE — Consult Note (Signed)
   [facility]patient Consult   02/10/2017  [name] Feb 01, 1951 [id number]  Patient's chart  reviewed for multiple admissions in the [employer] list.  Found that the patient will have her transition of care calls from her primary care provider listed as [facility]. For questions:  Natividad Brood, RN BSN [facility] Liaison  [phone] business mobile phone Toll free office [phone]

## 2017-02-17 ENCOUNTER — Encounter: Payer: Medicare Other | Admitting: Hematology

## 2017-02-17 ENCOUNTER — Other Ambulatory Visit: Payer: Medicare Other

## 2017-02-17 ENCOUNTER — Telehealth: Payer: Self-pay | Admitting: Hematology

## 2017-02-17 NOTE — Telephone Encounter (Signed)
Called patient regarding upcoming appointments, however could not leave a voicemail because it was not set up.   Sending a reminder letter.

## 2017-02-18 NOTE — Progress Notes (Signed)
This encounter was created in error - please disregard.

## 2017-02-26 ENCOUNTER — Encounter: Payer: Medicare Other | Admitting: Hematology

## 2017-02-26 ENCOUNTER — Other Ambulatory Visit: Payer: Medicare Other

## 2017-02-27 NOTE — Progress Notes (Signed)
No show today, will reschedule   This encounter was created in error - please disregard.

## 2017-03-23 ENCOUNTER — Inpatient Hospital Stay (HOSPITAL_COMMUNITY)
Admission: EM | Admit: 2017-03-23 | Discharge: 2017-03-30 | DRG: 280 | Disposition: A | Discharge status: Home Health | Payer: Medicare Other | Attending: Internal Medicine | Admitting: Internal Medicine

## 2017-03-23 ENCOUNTER — Encounter (HOSPITAL_COMMUNITY): Payer: Self-pay | Admitting: Emergency Medicine

## 2017-03-23 ENCOUNTER — Emergency Department (HOSPITAL_COMMUNITY): Payer: Medicare Other

## 2017-03-23 DIAGNOSIS — Z9119 Patient's noncompliance with other medical treatment and regimen: Secondary | ICD-10-CM | POA: Diagnosis not present

## 2017-03-23 DIAGNOSIS — Z9114 Patient's other noncompliance with medication regimen: Secondary | ICD-10-CM

## 2017-03-23 DIAGNOSIS — I160 Hypertensive urgency: Secondary | ICD-10-CM | POA: Diagnosis present

## 2017-03-23 DIAGNOSIS — D72829 Elevated white blood cell count, unspecified: Secondary | ICD-10-CM | POA: Diagnosis present

## 2017-03-23 DIAGNOSIS — I2510 Atherosclerotic heart disease of native coronary artery without angina pectoris: Secondary | ICD-10-CM

## 2017-03-23 DIAGNOSIS — N183 Chronic kidney disease, stage 3 unspecified: Secondary | ICD-10-CM

## 2017-03-23 DIAGNOSIS — Z170 Estrogen receptor positive status [ER+]: Secondary | ICD-10-CM | POA: Diagnosis not present

## 2017-03-23 DIAGNOSIS — Z8551 Personal history of malignant neoplasm of bladder: Secondary | ICD-10-CM

## 2017-03-23 DIAGNOSIS — I700 Atherosclerosis of aorta: Secondary | ICD-10-CM

## 2017-03-23 DIAGNOSIS — Z8711 Personal history of peptic ulcer disease: Secondary | ICD-10-CM | POA: Diagnosis not present

## 2017-03-23 DIAGNOSIS — F411 Generalized anxiety disorder: Secondary | ICD-10-CM | POA: Diagnosis present

## 2017-03-23 DIAGNOSIS — F329 Major depressive disorder, single episode, unspecified: Secondary | ICD-10-CM | POA: Diagnosis present

## 2017-03-23 DIAGNOSIS — I110 Hypertensive heart disease with heart failure: Secondary | ICD-10-CM

## 2017-03-23 DIAGNOSIS — J9601 Acute respiratory failure with hypoxia: Secondary | ICD-10-CM | POA: Diagnosis present

## 2017-03-23 DIAGNOSIS — I130 Hypertensive heart and chronic kidney disease with heart failure and stage 1 through stage 4 chronic kidney disease, or unspecified chronic kidney disease: Secondary | ICD-10-CM | POA: Diagnosis present

## 2017-03-23 DIAGNOSIS — F1410 Cocaine abuse, uncomplicated: Secondary | ICD-10-CM | POA: Diagnosis present

## 2017-03-23 DIAGNOSIS — I21A1 Myocardial infarction type 2: Secondary | ICD-10-CM | POA: Diagnosis present

## 2017-03-23 DIAGNOSIS — Z79899 Other long term (current) drug therapy: Secondary | ICD-10-CM

## 2017-03-23 DIAGNOSIS — I480 Paroxysmal atrial fibrillation: Secondary | ICD-10-CM

## 2017-03-23 DIAGNOSIS — Z7982 Long term (current) use of aspirin: Secondary | ICD-10-CM

## 2017-03-23 DIAGNOSIS — I158 Other secondary hypertension: Secondary | ICD-10-CM | POA: Diagnosis present

## 2017-03-23 DIAGNOSIS — K5900 Constipation, unspecified: Secondary | ICD-10-CM | POA: Diagnosis present

## 2017-03-23 DIAGNOSIS — Z609 Problem related to social environment, unspecified: Secondary | ICD-10-CM | POA: Diagnosis present

## 2017-03-23 DIAGNOSIS — R748 Abnormal levels of other serum enzymes: Secondary | ICD-10-CM | POA: Diagnosis present

## 2017-03-23 DIAGNOSIS — Z87898 Personal history of other specified conditions: Secondary | ICD-10-CM

## 2017-03-23 DIAGNOSIS — R7989 Other specified abnormal findings of blood chemistry: Secondary | ICD-10-CM

## 2017-03-23 DIAGNOSIS — I5033 Acute on chronic diastolic (congestive) heart failure: Secondary | ICD-10-CM | POA: Diagnosis present

## 2017-03-23 DIAGNOSIS — J449 Chronic obstructive pulmonary disease, unspecified: Secondary | ICD-10-CM | POA: Diagnosis present

## 2017-03-23 DIAGNOSIS — F17210 Nicotine dependence, cigarettes, uncomplicated: Secondary | ICD-10-CM | POA: Diagnosis present

## 2017-03-23 DIAGNOSIS — I4891 Unspecified atrial fibrillation: Secondary | ICD-10-CM | POA: Diagnosis not present

## 2017-03-23 DIAGNOSIS — Z91199 Patient's noncompliance with other medical treatment and regimen due to unspecified reason: Secondary | ICD-10-CM

## 2017-03-23 DIAGNOSIS — R778 Other specified abnormalities of plasma proteins: Secondary | ICD-10-CM

## 2017-03-23 DIAGNOSIS — Z853 Personal history of malignant neoplasm of breast: Secondary | ICD-10-CM

## 2017-03-23 DIAGNOSIS — R0602 Shortness of breath: Secondary | ICD-10-CM

## 2017-03-23 DIAGNOSIS — I493 Ventricular premature depolarization: Secondary | ICD-10-CM | POA: Diagnosis present

## 2017-03-23 DIAGNOSIS — I119 Hypertensive heart disease without heart failure: Secondary | ICD-10-CM

## 2017-03-23 DIAGNOSIS — Z923 Personal history of irradiation: Secondary | ICD-10-CM

## 2017-03-23 HISTORY — DX: Chronic kidney disease, stage 3 unspecified: N18.30

## 2017-03-23 HISTORY — DX: Atherosclerosis of aorta: I70.0

## 2017-03-23 HISTORY — DX: Chronic diastolic (congestive) heart failure: I50.32

## 2017-03-23 HISTORY — DX: Hypertensive heart disease without heart failure: I11.9

## 2017-03-23 HISTORY — DX: Atherosclerotic heart disease of native coronary artery without angina pectoris: I25.10

## 2017-03-23 HISTORY — DX: Chronic obstructive pulmonary disease, unspecified: J44.9

## 2017-03-23 HISTORY — DX: Paroxysmal atrial fibrillation: I48.0

## 2017-03-23 HISTORY — DX: Proteinuria, unspecified: R80.9

## 2017-03-23 HISTORY — DX: Chronic kidney disease, stage 3 (moderate): N18.3

## 2017-03-23 HISTORY — DX: Personal history of malignant neoplasm of breast: Z85.3

## 2017-03-23 HISTORY — DX: Generalized anxiety disorder: F41.1

## 2017-03-23 LAB — BASIC METABOLIC PANEL
Anion gap: 6 (ref 5–15)
BUN: 20 mg/dL (ref 6–20)
CHLORIDE: 110 mmol/L (ref 101–111)
CO2: 22 mmol/L (ref 22–32)
CREATININE: 1.71 mg/dL — AB (ref 0.44–1.00)
Calcium: 8.6 mg/dL — ABNORMAL LOW (ref 8.9–10.3)
GFR calc Af Amer: 35 mL/min — ABNORMAL LOW (ref >60)
GFR, EST NON AFRICAN AMERICAN: 30 mL/min — AB (ref >60)
Glucose, Bld: 105 mg/dL — ABNORMAL HIGH (ref 65–99)
Potassium: 3.7 mmol/L (ref 3.5–5.1)
SODIUM: 138 mmol/L (ref 135–145)

## 2017-03-23 LAB — PROTIME-INR
INR: 0.98
Prothrombin Time: 12.9 seconds (ref 11.4–15.2)

## 2017-03-23 LAB — I-STAT TROPONIN, ED: Troponin i, poc: 0.13 ng/mL (ref 0.00–0.08)

## 2017-03-23 LAB — CBC
HCT: 35.4 % — ABNORMAL LOW (ref 36.0–46.0)
Hemoglobin: 11 g/dL — ABNORMAL LOW (ref 12.0–15.0)
MCH: 27.4 pg (ref 26.0–34.0)
MCHC: 31.1 g/dL (ref 30.0–36.0)
MCV: 88.3 fL (ref 78.0–100.0)
PLATELETS: 394 10*3/uL (ref 150–400)
RBC: 4.01 MIL/uL (ref 3.87–5.11)
RDW: 15.9 % — AB (ref 11.5–15.5)
WBC: 7.9 10*3/uL (ref 4.0–10.5)

## 2017-03-23 LAB — BRAIN NATRIURETIC PEPTIDE: B NATRIURETIC PEPTIDE 5: 1041.9 pg/mL — AB (ref 0.0–100.0)

## 2017-03-23 LAB — MAGNESIUM: Magnesium: 1.8 mg/dL (ref 1.7–2.4)

## 2017-03-23 MED ORDER — DILTIAZEM LOAD VIA INFUSION
10.0000 mg | Freq: Once | INTRAVENOUS | Status: AC
  Administered 2017-03-23: 10 mg via INTRAVENOUS
  Filled 2017-03-23: qty 10

## 2017-03-23 MED ORDER — SODIUM CHLORIDE 0.9 % IV SOLN: 250.0000 mL | INTRAVENOUS | Status: DC | PRN

## 2017-03-23 MED ORDER — FUROSEMIDE 10 MG/ML IJ SOLN
40.0000 mg | Freq: Two times a day (BID) | INTRAMUSCULAR | Status: DC
  Administered 2017-03-24 – 2017-03-25 (×4): 40 mg via INTRAVENOUS
  Filled 2017-03-23 (×4): qty 4

## 2017-03-23 MED ORDER — SODIUM CHLORIDE 0.9% FLUSH: 3.0000 mL | INTRAVENOUS | Status: DC | PRN

## 2017-03-23 MED ORDER — ENOXAPARIN SODIUM 30 MG/0.3ML ~~LOC~~ SOLN
30.0000 mg | Freq: Every day | SUBCUTANEOUS | Status: DC
  Filled 2017-03-23 (×2): qty 0.3

## 2017-03-23 MED ORDER — SODIUM CHLORIDE 0.9% FLUSH
3.0000 mL | Freq: Two times a day (BID) | INTRAVENOUS | Status: DC
  Administered 2017-03-24 – 2017-03-29 (×11): 3 mL via INTRAVENOUS

## 2017-03-23 MED ORDER — DEXTROSE 5 % IV SOLN
5.0000 mg/h | INTRAVENOUS | Status: DC
  Administered 2017-03-23: 5 mg/h via INTRAVENOUS
  Administered 2017-03-24 – 2017-03-25 (×5): 15 mg/h via INTRAVENOUS
  Filled 2017-03-23 (×6): qty 100

## 2017-03-23 MED ORDER — ACETAMINOPHEN 325 MG PO TABS
650.0000 mg | ORAL_TABLET | ORAL | Status: DC | PRN
  Administered 2017-03-24 – 2017-03-29 (×3): 650 mg via ORAL
  Filled 2017-03-23 (×3): qty 2

## 2017-03-23 MED ORDER — ONDANSETRON HCL 4 MG/2ML IJ SOLN
4.0000 mg | Freq: Four times a day (QID) | INTRAMUSCULAR | Status: DC | PRN
  Administered 2017-03-29: 4 mg via INTRAVENOUS
  Filled 2017-03-23: qty 2
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
## 2017-03-23 NOTE — ED Notes (Addendum)
Rapid bolus of additional 10 mg diltiazem given per verbal order by Dr. Wynonia Lawman

## 2017-03-23 NOTE — ED Notes (Signed)
Delay in lab draw,  edp at bedside. 
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
## 2017-03-23 NOTE — ED Triage Notes (Signed)
Per EMS, pt c/o shortness of breath x 2 days. Hx COPD, A-fib, EMS EKG shows a-fib with RVR, rate around 140, pt has been out of medications since Friday. Pt given 15mg  albuterol, and 1.0 mg atrovent PTA.

## 2017-03-24 DIAGNOSIS — I480 Paroxysmal atrial fibrillation: Secondary | ICD-10-CM

## 2017-03-24 DIAGNOSIS — I700 Atherosclerosis of aorta: Secondary | ICD-10-CM

## 2017-03-24 DIAGNOSIS — I4891 Unspecified atrial fibrillation: Secondary | ICD-10-CM

## 2017-03-24 DIAGNOSIS — N183 Chronic kidney disease, stage 3 (moderate): Secondary | ICD-10-CM

## 2017-03-24 DIAGNOSIS — I5033 Acute on chronic diastolic (congestive) heart failure: Secondary | ICD-10-CM

## 2017-03-24 DIAGNOSIS — Z9119 Patient's noncompliance with other medical treatment and regimen: Secondary | ICD-10-CM

## 2017-03-24 LAB — RAPID URINE DRUG SCREEN, HOSP PERFORMED
AMPHETAMINES: NOT DETECTED
BENZODIAZEPINES: NOT DETECTED
Barbiturates: NOT DETECTED
Cocaine: POSITIVE — AB
OPIATES: NOT DETECTED
Tetrahydrocannabinol: NOT DETECTED

## 2017-03-24 LAB — TROPONIN I
TROPONIN I: 0.04 ng/mL — AB (ref <0.03)
TROPONIN I: 0.05 ng/mL — AB (ref <0.03)
TROPONIN I: 0.06 ng/mL — AB (ref <0.03)

## 2017-03-24 LAB — URINALYSIS, ROUTINE W REFLEX MICROSCOPIC
BILIRUBIN URINE: NEGATIVE
Glucose, UA: NEGATIVE mg/dL
Hgb urine dipstick: NEGATIVE
Ketones, ur: NEGATIVE mg/dL
Leukocytes, UA: NEGATIVE
Nitrite: NEGATIVE
Specific Gravity, Urine: 1.013 (ref 1.005–1.030)
pH: 5 (ref 5.0–8.0)

## 2017-03-24 LAB — CBC WITH DIFFERENTIAL/PLATELET
BASOS ABS: 0 10*3/uL (ref 0.0–0.1)
BASOS PCT: 0 %
EOS ABS: 0 10*3/uL (ref 0.0–0.7)
Eosinophils Relative: 0 %
HEMATOCRIT: 38.2 % (ref 36.0–46.0)
HEMOGLOBIN: 12 g/dL (ref 12.0–15.0)
Lymphocytes Relative: 12 %
Lymphs Abs: 0.7 10*3/uL (ref 0.7–4.0)
MCH: 27.8 pg (ref 26.0–34.0)
MCHC: 31.4 g/dL (ref 30.0–36.0)
MCV: 88.4 fL (ref 78.0–100.0)
Monocytes Absolute: 0 10*3/uL — ABNORMAL LOW (ref 0.1–1.0)
Monocytes Relative: 1 %
NEUTROS ABS: 5.4 10*3/uL (ref 1.7–7.7)
Neutrophils Relative %: 87 %
Platelets: 374 10*3/uL (ref 150–400)
RBC: 4.32 MIL/uL (ref 3.87–5.11)
RDW: 15.5 % (ref 11.5–15.5)
WBC: 6.1 10*3/uL (ref 4.0–10.5)

## 2017-03-24 LAB — BASIC METABOLIC PANEL
ANION GAP: 11 (ref 5–15)
BUN: 24 mg/dL — ABNORMAL HIGH (ref 6–20)
CO2: 19 mmol/L — ABNORMAL LOW (ref 22–32)
Calcium: 8.7 mg/dL — ABNORMAL LOW (ref 8.9–10.3)
Chloride: 107 mmol/L (ref 101–111)
Creatinine, Ser: 1.83 mg/dL — ABNORMAL HIGH (ref 0.44–1.00)
GFR calc non Af Amer: 28 mL/min — ABNORMAL LOW (ref >60)
GFR, EST AFRICAN AMERICAN: 32 mL/min — AB (ref >60)
Glucose, Bld: 260 mg/dL — ABNORMAL HIGH (ref 65–99)
POTASSIUM: 4 mmol/L (ref 3.5–5.1)
SODIUM: 137 mmol/L (ref 135–145)

## 2017-03-24 LAB — I-STAT TROPONIN, ED: Troponin i, poc: 0.06 ng/mL (ref 0.00–0.08)

## 2017-03-24 MED ORDER — AMIODARONE HCL IN DEXTROSE 360-4.14 MG/200ML-% IV SOLN
INTRAVENOUS | Status: AC
  Administered 2017-03-24: 60 mg/h via INTRAVENOUS
  Filled 2017-03-24: qty 200

## 2017-03-24 MED ORDER — AMIODARONE HCL IN DEXTROSE 360-4.14 MG/200ML-% IV SOLN
30.0000 mg/h | INTRAVENOUS | Status: DC
  Filled 2017-03-24: qty 200

## 2017-03-24 MED ORDER — AMIODARONE HCL IN DEXTROSE 360-4.14 MG/200ML-% IV SOLN
60.0000 mg/h | INTRAVENOUS | Status: DC
  Administered 2017-03-24 – 2017-03-25 (×2): 60 mg/h via INTRAVENOUS

## 2017-03-24 NOTE — Progress Notes (Addendum)
  Amiodarone Drug - Drug Interaction Consult Note  Patient does have history of cocaine use and amiodarone does have some beta blocker function but would have less risk of coronary artery vasoconstriction and systemic HTN then selective beta blockers.  Recommendations: Monitor closely for bradycardia, electrolyte disturbances   Amiodarone is metabolized by the cytochrome P450 system and therefore has the potential to cause many drug interactions. Amiodarone has an average plasma half-life of 50 days (range 20 to 100 days).   There is potential for drug interactions to occur several weeks or months after stopping treatment and the onset of drug interactions may be slow after initiating amiodarone.    [x]   Calcium channel blockers (diltiazem and verapamil): increased risk of bradycardia, AV block and myocardial depression.   [x]  Diuretics: increased risk of cardiotoxicity if hypokalemia occurs.   Thank Mable Fill  03/24/2017 6:31 PM

## 2017-03-24 NOTE — ED Notes (Signed)
Pt upset waiting for room and for meal tray. Meal tray ordered for patient. Apologized to patient for wait time and informed her that we will move her to SDU asap.

## 2017-03-24 NOTE — ED Notes (Signed)
Pt provided with 265ml fluid

## 2017-03-24 NOTE — Clinical Social Work Note (Signed)
Clinical Social Work Assessment  Patient Details  Name: ARILYNN BLAKENEY MRN: [id number] Date of Birth: Dec 23, 1950  Date of referral:  03/24/17               Reason for consult:  Substance Use/ETOH Abuse                Permission sought to share information with:  Case Manager Permission granted to share information::  Yes, Verbal Permission Granted  Name::     Lynnea Ferrier   Agency::     Relationship::     Contact Information:     Housing/Transportation Living arrangements for the past 2 months:   (pt reports living at [facility] here in [place]. ) Source of Information:  Patient Patient Interpreter Needed:  None Criminal Activity/Legal Involvement Pertinent to Current Situation/Hospitalization:  No - Comment as needed Significant Relationships:  Other(Comment) (members at [employer] ) Lives with:  Facility Resident Do you feel safe going back to the place where you live?  Yes Need for family participation in patient care:  Yes (Comment)  Care giving concerns: CSW spoke with pt at bedside at this time pt is only concerned about housing and how pt will afford it.    Social Worker assessment / plan:  CSW spoke with pt at bedside. CSW was informed by pt that pt has been at a center called [facility]. Pt is unsure as to how long pt has been at this place but knows that it has been since last year. CSW was also informed that pt has been working on getting stable housing and informed CSW that pt can stay at [facility] but will have to pay for pt's own rent as pt will no longer be apart of the transitional  housing program there. Pt reports not having any other support aside from the facility residence and staff.   Employment status:  Other (Comment) (unknown ) Insurance information:  Managed Medicare PT Recommendations:  Not assessed at this time Information / Referral to community resources:     Patient/Family's Response to care:  Pt is understanding and agreeable to plan of care at  this time.   Patient/Family's Understanding of and Emotional Response to Diagnosis, Current Treatment, and Prognosis:  No further questions or concerns have been presented to CSW at this time.  Emotional Assessment Appearance:  Appears stated age Attitude/Demeanor/Rapport:    Affect (typically observed):  Pleasant Orientation:  Oriented to Self, Oriented to Place, Oriented to  Time, Oriented to Situation Alcohol / Substance use:  Tobacco Use, Alcohol Use Psych involvement (Current and /or in the community):  No (Comment)  Discharge Needs  Concerns to be addressed:  Homelessness, Basic Needs (potentially. ) Readmission within the last 30 days:  No Current discharge risk:  None Barriers to Discharge:  No Barriers Identified   Wetzel Bjornstad, [place] 03/24/2017, 12:00 PM

## 2017-03-24 NOTE — Progress Notes (Signed)
Progress Note  Patient Name: Victoria Burke Date of Encounter: 03/24/2017  Primary Cardiologist: Marlou Porch  Subjective   Laying in bed, no SOB, no CP  Inpatient Medications    Scheduled Meds: . enoxaparin (LOVENOX) injection  30 mg Subcutaneous Daily  . furosemide  40 mg Intravenous BID  . sodium chloride flush  3 mL Intravenous Q12H   Continuous Infusions: . sodium chloride    . diltiazem (CARDIZEM) infusion 15 mg/hr (03/24/17 0925)   PRN Meds: sodium chloride, acetaminophen, ondansetron (ZOFRAN) IV, sodium chloride flush   Vital Signs    Vitals:   03/24/17 1000 03/24/17 1030 03/24/17 1045 03/24/17 1100  BP: (!) 125/109 (!) 150/100 (!) 140/103 (!) 167/103  Pulse: 66 89 (!) 29 (!) 36  Resp: (!) 28 (!) 22 (!) 22 (!) 25  SpO2: 98% 97% 98% 97%  Weight:      Height:        Intake/Output Summary (Last 24 hours) at 03/24/17 1137 Last data filed at 03/24/17 1126  Gross per 24 hour  Intake              253 ml  Output                0 ml  Net              253 ml   Filed Weights   03/23/17 2044  Weight: 120 lb (54.4 kg)    Telemetry    AFIB 100-140 - Personally Reviewed  ECG    AFIB RVR - Personally Reviewed  Physical Exam   GEN: No acute distress.   Neck: No JVD Cardiac: Irreg irreg tachy, no murmurs, rubs, or gallops.  Respiratory: Clear to auscultation bilaterally. GI: Soft, nontender, non-distended  MS: No edema; No deformity. Neuro:  Nonfocal  Psych: Normal affect   Labs    Chemistry Recent Labs Lab 03/23/17 2055 03/24/17 0539  NA 138 137  K 3.7 4.0  CL 110 107  CO2 22 19*  GLUCOSE 105* 260*  BUN 20 24*  CREATININE 1.71* 1.83*  CALCIUM 8.6* 8.7*  GFRNONAA 30* 28*  GFRAA 35* 32*  ANIONGAP 6 11     Hematology Recent Labs Lab 03/23/17 2055 03/24/17 0539  WBC 7.9 6.1  RBC 4.01 4.32  HGB 11.0* 12.0  HCT 35.4* 38.2  MCV 88.3 88.4  MCH 27.4 27.8  MCHC 31.1 31.4  RDW 15.9* 15.5  PLT 394 374    Cardiac Enzymes Recent  Labs Lab 03/24/17 0052 03/24/17 0539  TROPONINI 0.06* 0.05*    Recent Labs Lab 03/23/17 2122 03/24/17 0106  TROPIPOC 0.13* 0.06     BNP Recent Labs Lab 03/23/17 2058  BNP 1,041.9*     DDimer No results for input(s): DDIMER in the last 168 hours.   Radiology    Dg Chest Portable 1 View  Result Date: 03/23/2017 CLINICAL DATA:  Shortness of breath EXAM: PORTABLE CHEST 1 VIEW COMPARISON:  02/04/2017, 02/03/2017 FINDINGS: Bilateral pleural effusions. Cardiomegaly with aortic atherosclerosis. Central vascular congestion. Mild diffuse increased interstitial opacity suggesting mild background edema. Patchy focal opacities in the left and right upper lobes could reflect mild infiltrates. IMPRESSION: 1. Cardiomegaly with central vascular congestion and bilateral effusions. Mild diffuse interstitial prominence suggesting mild background edema 2. More focal pulmonary opacities in the right and left upper lobes could relate to superimposed infiltrates. Atelectasis or infiltrates at the bilateral lung bases. Electronically Signed   By: Donavan Foil M.D.   On: 03/23/2017  21:19    Cardiac Studies   ECHO 02/05/17  - Normal LV size with moderate LV hypertrophy. EF 55-60%. Moderate   diastolic dysfunction. Normal RV size and systolic function.   Moderate pulmonary hypertension. Mild-moderate mitral   regurgitation. Biatrial enlargement.  Patient Profile     66 y.o. female with cocaine use here with PAF with RVR, normal EF  Assessment & Plan    PAF  - continue with IV dilt currently at 15  - avoid Bb  - If unable to control with dilt, could consider amiodarone, but this does have some Bb function and we will need to weigh risks vs benefits  - Her CHADS-VASC is at least 2 (F, Age). Would recommend anticoagulation but in the past this has not been given because of lack of compliance.   - Given her reduced GFR, would recommend coumadin when she is able to comply with medications and stop  cocaine. She takes full responsibility for possible stroke.   - needs to stop ETOH as well  Troponin elevation/demand ischemia  - in the setting of cocaine and afib.   - CAD was mild RCA and LAD dz previously.   Acute diastolic HF  - improved with IV lasix, continue.    For questions or updates, please contact [facility] Please consult [email or web address] for contact info under Cardiology/STEMI.      Signed, Candee Furbish, MD  03/24/2017, 11:37 AM

## 2017-03-24 NOTE — Progress Notes (Signed)
PROGRESS NOTE    Victoria Burke  NOB:[id number] DOB: 1950/11/13 DOA: 03/23/2017 PCP: Dorena Dew, FNP   Brief Narrative:  Victoria Burke is a 66 y.o. female with medical history significant of anemia, breast cancer, Afib, COPD, tobacco abuse, GERD, Gout, HLD, HTN, Breast Cancer, urothelial/bladder cancer, and polysubstance abuse; who presents with complaints of shortness breath for the last 2-3 days. She accidentally all lost her medications while riding on the bus some time last Thursday. She reports not eating able to request refills since that time. Subsequently, reports using cocaine yesterday. Associated symptoms include dyspnea on exertion, nonproductive cough, orthopnea, and PND. Denies any significant chest pain. Upon admission into the emergency department patient was seen to be in A. fib with RVR with heart rates 164. Patient was placed on diltiazem drip with improvement of heart rates but remained uncontrolled. Cardiology evaluated and recommended IV Amiodarone. Cardiology also recommended Anticoagulation but because patient was non-compliant and still doing Cocaine was not started.    Assessment & Plan:   Principal Problem:   Atrial fibrillation with RVR (HCC) Active Problems:   Hypertensive urgency   Cocaine abuse   Acute on chronic diastolic congestive heart failure (HCC)   CAD (coronary artery disease), native coronary artery   Kidney disease, chronic, stage III (GFR 30-59 ml/min)   Aortic atherosclerosis (HCC)   Hypertensive heart disease   History of noncompliance with medical treatment   Paroxysmal atrial fibrillation (HCC)  Atrial fibrillation with RVR:  -Acute. Initial heart rates into the 160s.  -Patient placed on a Cardizem drip in the ED.  -Dr. Wynonia Lawman of Cardiology evaluated in the ED and Dr. Marlou Porch evaluated today - Admitted to stepdown  - Continue diltiazem gtt and added IV Amiodarone today given uncontrolled rates - Per Cards avoid BB - CHADS2-Vasc at  least 2; Cardiology recommending [facility] but not given in the past due to non-compliance and per Cards recommends Coumadin when she is able to comply with meds and stop Cocaine and understands fully the risks of having a Stroke - Restart Cardizem po when able - Appreciated cardiology consultative services, will follow-up for further recommendations   Diastolic congestive heart failure exacerbation:  -Acute. Patient presents with complaints of shortness of breath after stopping all medications.  -Found to be in A. fib with RVR.  -Chest x-ray showing signs of pulmonary edema with BNP elevated at 1041.9 .  -Last echocardiogram showing EF was 55-60% on 02/05/2017. - Heart failure orders set  initiated  - Continuous pulse oximetry with nasal cannula oxygen as needed to keep O2 saturations >92% - Strict I&Os and daily weights - Elevate lower extremities - Lasix 40 mg IV Bid - Reassess in a.m. and adjust diuresis as needed. - Check Echocardiogram - Optimize medical management as able  Hypoxia secondary to pulmonary edema - Plan as seen above; C/w Diuresis - Improving   Type 2 NSTEMI from Demand Ischemia - Acute. In the setting of Cocaine and A Fib - Trend cardiac enzymes and trending down  Polysubstance abuse - Counseled on the need of cessation of cocaine use  - UDS Positive for Cocaine - Social work consult for Resources  Hypertensive Urgency:  -Blood pressure is high as 172/116 on admission. -Will need to restart previous home meds  CKD Stage 3 -BUN/Cr was 20/1.71 on Admission and went to 24/1.83 -Continue to Monitor as getting diuresed and Avoid Nephrotoxics -Repeat CMP in AM  Hx of Breast Cancer  -No active Issue -S/p Lumpectomy, Radiation  and Arimidex   DVT prophylaxis: Enoxaparin 30 mg sq daily Code Status: FULL CODE Family Communication: No family present at bedside Disposition Plan: Remain in SDU  Consultants:   Cardiology   Procedures: None  Antimicrobials:    Anti-infectives    None     Subjective: Seen and examined and stated left all her meds on a bus. Does not recall when she took cocaine. Still somewhat SOB.   Objective: Vitals:   03/24/17 1530 03/24/17 1600 03/24/17 1706 03/24/17 2013  BP: (!) 134/94 (!) 138/99 (!) 135/115 (!) 147/108  Pulse: 89 (!) 132 (!) 124 (!) 55  Resp:  (!) 26 (!) 24 (!) 30  Temp:   98.6 F (37 C) 98 F (36.7 C)  TempSrc:   Oral Oral  SpO2: 98% 96% 96% 98%  Weight:      Height:        Intake/Output Summary (Last 24 hours) at 03/24/17 2053 Last data filed at 03/24/17 1858  Gross per 24 hour  Intake              253 ml  Output              400 ml  Net             -147 ml   Filed Weights   03/23/17 2044  Weight: 54.4 kg (120 lb)   Examination: Physical Exam:  Constitutional: Thin AAF in NAD and appears calm  Eyes:  Lids and conjunctivae normal, sclerae anicteric  ENMT: External Ears, Nose appear normal. Grossly normal hearing. Mucous membranes are moist Neck: Appears normal, supple, no cervical masses, normal ROM, no appreciable thyromegaly Respiratory: Diminished to auscultation bilaterally with some crackles. Slightly increased Respirations Cardiovascular: Irregularly Irregular, no murmurs / rubs / gallops. S1 and S2 auscultated. No extremity edema.  Abdomen: Soft, non-tender, non-distended. No masses palpated. No appreciable hepatosplenomegaly. Bowel sounds positive.  GU: Deferred. Musculoskeletal: No clubbing / cyanosis of digits/nails. No joint deformity upper and lower extremities.  Skin: No rashes, lesions, ulcers on a limited skin eval. No induration; Warm and dry.  Neurologic: CN 2-12 grossly intact with no focal deficits. Romberg sign cerebellar reflexes not assessed.  Psychiatric: Impaired judgment and insight. Alert and oriented x 3. Normal mood and appropriate affect.   Data Reviewed: I have personally reviewed following labs and imaging studies  CBC:  Recent Labs Lab  03/23/17 2055 03/24/17 0539  WBC 7.9 6.1  NEUTROABS  --  5.4  HGB 11.0* 12.0  HCT 35.4* 38.2  MCV 88.3 88.4  PLT 394 409   Basic Metabolic Panel:  Recent Labs Lab 03/23/17 2055 03/23/17 2058 03/24/17 0539  NA 138  --  137  K 3.7  --  4.0  CL 110  --  107  CO2 22  --  19*  GLUCOSE 105*  --  260*  BUN 20  --  24*  CREATININE 1.71*  --  1.83*  CALCIUM 8.6*  --  8.7*  MG  --  1.8  --    GFR: Estimated Creatinine Clearance: 24.2 mL/min (A) (by C-G formula based on SCr of 1.83 mg/dL (H)). Liver Function Tests: No results for input(s): AST, ALT, ALKPHOS, BILITOT, PROT, ALBUMIN in the last 168 hours. No results for input(s): LIPASE, AMYLASE in the last 168 hours. No results for input(s): AMMONIA in the last 168 hours. Coagulation Profile:  Recent Labs Lab 03/23/17 2058  INR 0.98   Cardiac Enzymes:  Recent Labs Lab 03/24/17  8177 03/24/17 0539 03/24/17 1130  TROPONINI 0.06* 0.05* 0.04*   BNP (last 3 results) No results for input(s): PROBNP in the last 8760 hours. HbA1C: No results for input(s): HGBA1C in the last 72 hours. CBG: No results for input(s): GLUCAP in the last 168 hours. Lipid Profile: No results for input(s): CHOL, HDL, LDLCALC, TRIG, CHOLHDL, LDLDIRECT in the last 72 hours. Thyroid Function Tests: No results for input(s): TSH, T4TOTAL, FREET4, T3FREE, THYROIDAB in the last 72 hours. Anemia Panel: No results for input(s): VITAMINB12, FOLATE, FERRITIN, TIBC, IRON, RETICCTPCT in the last 72 hours. Sepsis Labs: No results for input(s): PROCALCITON, LATICACIDVEN in the last 168 hours.  No results found for this or any previous visit (from the past 240 hour(s)).   Radiology Studies: Dg Chest Portable 1 View  Result Date: 03/23/2017 CLINICAL DATA:  Shortness of breath EXAM: PORTABLE CHEST 1 VIEW COMPARISON:  02/04/2017, 02/03/2017 FINDINGS: Bilateral pleural effusions. Cardiomegaly with aortic atherosclerosis. Central vascular congestion. Mild diffuse  increased interstitial opacity suggesting mild background edema. Patchy focal opacities in the left and right upper lobes could reflect mild infiltrates. IMPRESSION: 1. Cardiomegaly with central vascular congestion and bilateral effusions. Mild diffuse interstitial prominence suggesting mild background edema 2. More focal pulmonary opacities in the right and left upper lobes could relate to superimposed infiltrates. Atelectasis or infiltrates at the bilateral lung bases. Electronically Signed   By: Donavan Foil M.D.   On: 03/23/2017 21:19   Scheduled Meds: . enoxaparin (LOVENOX) injection  30 mg Subcutaneous Daily  . furosemide  40 mg Intravenous BID  . sodium chloride flush  3 mL Intravenous Q12H   Continuous Infusions: . sodium chloride    . amiodarone 60 mg/hr (03/24/17 1924)  . [START ON 03/25/2017] amiodarone    . diltiazem (CARDIZEM) infusion 15 mg/hr (03/24/17 1646)     LOS: 1 day   Kerney Elbe, DO [facility] Pager [phone]  If 7PM-7AM, please contact night-coverage [email or web address] Password [facility] 03/24/2017, 8:53 PM

## 2017-03-25 ENCOUNTER — Encounter (HOSPITAL_COMMUNITY): Payer: Self-pay | Admitting: General Practice

## 2017-03-25 DIAGNOSIS — D72829 Elevated white blood cell count, unspecified: Secondary | ICD-10-CM

## 2017-03-25 LAB — COMPREHENSIVE METABOLIC PANEL
ALT: 19 U/L (ref 14–54)
AST: 25 U/L (ref 15–41)
Albumin: 2.3 g/dL — ABNORMAL LOW (ref 3.5–5.0)
Alkaline Phosphatase: 79 U/L (ref 38–126)
Anion gap: 8 (ref 5–15)
BILIRUBIN TOTAL: 0.5 mg/dL (ref 0.3–1.2)
BUN: 39 mg/dL — ABNORMAL HIGH (ref 6–20)
CO2: 21 mmol/L — ABNORMAL LOW (ref 22–32)
Calcium: 8.6 mg/dL — ABNORMAL LOW (ref 8.9–10.3)
Chloride: 105 mmol/L (ref 101–111)
Creatinine, Ser: 2.39 mg/dL — ABNORMAL HIGH (ref 0.44–1.00)
GFR, EST AFRICAN AMERICAN: 23 mL/min — AB (ref >60)
GFR, EST NON AFRICAN AMERICAN: 20 mL/min — AB (ref >60)
Glucose, Bld: 142 mg/dL — ABNORMAL HIGH (ref 65–99)
POTASSIUM: 4.7 mmol/L (ref 3.5–5.1)
Sodium: 134 mmol/L — ABNORMAL LOW (ref 135–145)
TOTAL PROTEIN: 7.1 g/dL (ref 6.5–8.1)

## 2017-03-25 LAB — CBC WITH DIFFERENTIAL/PLATELET
BASOS PCT: 0 %
Basophils Absolute: 0 10*3/uL (ref 0.0–0.1)
EOS PCT: 0 %
Eosinophils Absolute: 0 10*3/uL (ref 0.0–0.7)
HEMATOCRIT: 37.1 % (ref 36.0–46.0)
Hemoglobin: 11.5 g/dL — ABNORMAL LOW (ref 12.0–15.0)
LYMPHS PCT: 11 %
Lymphs Abs: 1.3 10*3/uL (ref 0.7–4.0)
MCH: 27.6 pg (ref 26.0–34.0)
MCHC: 31 g/dL (ref 30.0–36.0)
MCV: 89.2 fL (ref 78.0–100.0)
MONO ABS: 1.2 10*3/uL — AB (ref 0.1–1.0)
MONOS PCT: 10 %
NEUTROS ABS: 9.8 10*3/uL — AB (ref 1.7–7.7)
Neutrophils Relative %: 79 %
Platelets: 403 10*3/uL — ABNORMAL HIGH (ref 150–400)
RBC: 4.16 MIL/uL (ref 3.87–5.11)
RDW: 15.7 % — AB (ref 11.5–15.5)
WBC: 12.3 10*3/uL — ABNORMAL HIGH (ref 4.0–10.5)

## 2017-03-25 LAB — PHOSPHORUS: PHOSPHORUS: 4.8 mg/dL — AB (ref 2.5–4.6)

## 2017-03-25 LAB — MAGNESIUM: MAGNESIUM: 1.7 mg/dL (ref 1.7–2.4)

## 2017-03-25 MED ORDER — DILTIAZEM HCL ER COATED BEADS 240 MG PO CP24
240.0000 mg | ORAL_CAPSULE | Freq: Every day | ORAL | Status: DC
  Administered 2017-03-25 – 2017-03-30 (×6): 240 mg via ORAL
  Filled 2017-03-25 (×6): qty 1

## 2017-03-25 MED ORDER — AMIODARONE HCL 200 MG PO TABS
400.0000 mg | ORAL_TABLET | Freq: Two times a day (BID) | ORAL | Status: DC
  Administered 2017-03-25 – 2017-03-29 (×9): 400 mg via ORAL
  Filled 2017-03-25 (×10): qty 2

## 2017-03-25 MED ORDER — BISACODYL 5 MG PO TBEC
10.0000 mg | DELAYED_RELEASE_TABLET | Freq: Once | ORAL | Status: AC
  Administered 2017-03-25: 10 mg via ORAL
  Filled 2017-03-25: qty 2

## 2017-03-25 MED ORDER — ASPIRIN EC 81 MG PO TBEC
81.0000 mg | DELAYED_RELEASE_TABLET | Freq: Every day | ORAL | Status: DC
Start: 2017-03-25 — End: 2017-03-30
  Administered 2017-03-25 – 2017-03-30 (×6): 81 mg via ORAL
  Filled 2017-03-25 (×6): qty 1

## 2017-03-25 MED ORDER — ZOLPIDEM TARTRATE 5 MG PO TABS
5.0000 mg | ORAL_TABLET | Freq: Every evening | ORAL | Status: DC | PRN
  Administered 2017-03-25 – 2017-03-28 (×3): 5 mg via ORAL
  Filled 2017-03-25 (×3): qty 1

## 2017-03-25 MED ORDER — ZOLPIDEM TARTRATE 5 MG PO TABS
5.0000 mg | ORAL_TABLET | Freq: Once | ORAL | Status: AC
  Administered 2017-03-25: 5 mg via ORAL
  Filled 2017-03-25: qty 1

## 2017-03-25 MED ORDER — AMIODARONE HCL IN DEXTROSE 360-4.14 MG/200ML-% IV SOLN
INTRAVENOUS | Status: AC
  Filled 2017-03-25: qty 200

## 2017-03-25 NOTE — Progress Notes (Addendum)
PROGRESS NOTE    Victoria Burke  GYI:[id number] DOB: [birth year] DOA: 03/23/2017 PCP: Victoria Dew, FNP   Brief Narrative:  Victoria Burke is a 66 y.o. female with medical history significant of anemia, breast cancer, Afib, COPD, tobacco abuse, GERD, Gout, HLD, HTN, Breast Cancer, urothelial/bladder cancer, and polysubstance abuse; who presents with complaints of shortness breath for the last 2-3 days. She accidentally all lost her medications while riding on the bus some time last Thursday. She reports not eating able to request refills since that time. Subsequently, reports using cocaine yesterday. Associated symptoms include dyspnea on exertion, nonproductive cough, orthopnea, and PND. Denies any significant chest pain. Upon admission into the emergency department patient was seen to be in A. fib with RVR with heart rates 164. Patient was placed on diltiazem drip with improvement of heart rates but remained uncontrolled. Cardiology evaluated and recommended IV Amiodarone. Cardiology also recommended Anticoagulation but because patient was non-compliant and still doing Cocaine was not started. Patient's HR converted to NSR and Cardiology adjusting medications.    Assessment & Plan:   Principal Problem:   Atrial fibrillation with RVR (HCC) Active Problems:   Hypertensive urgency   Cocaine abuse   Acute on chronic diastolic congestive heart failure (HCC)   CAD (coronary artery disease), native coronary artery   Kidney disease, chronic, stage III (GFR 30-59 ml/min)   Aortic atherosclerosis (HCC)   Hypertensive heart disease   History of noncompliance with medical treatment   Paroxysmal atrial fibrillation (HCC)  Paroxsymal Atrial fibrillation with RVR and now in NSR -Acute. Initial heart rates into the 160s -Patient placed on a Cardizem drip in the ED.  -Dr. Wynonia Lawman of Cardiology evaluated in the ED and Dr. Marlou Porch evaluated today - Admitted to Stepdown  - Diltiazem gtt and added IV  Amiodarone changed to po Cardizem 240 mg daily and po Amiodarone (400 mg po BID x5 days and then 200 mg po BID x 7 days and then 200 mg po Daily after) - Per Cards avoid BB - CHADS2-Vasc at least 2; Cardiology recommending [facility] but not given in the past due to non-compliance and per Cards recommends Coumadin when she is able to comply with meds and stop Cocaine and understands fully the risks of having a Stroke - Cardiology starting ASA 81 mg po Daily  - Appreciated cardiology consultative services, will follow-up for further recommendations   Diastolic congestive heart failure exacerbation:  - Acute. Patient presents with complaints of shortness of breath after stopping all medications.  - Found to be in A. fib with RVR but now in NSR - Chest x-ray showing signs of pulmonary edema with BNP elevated at 1041.9 .  - Last echocardiogram showing EF was 55-60% with Grade 2 Diastolic Dysfunction on 09/10/91. -Heart failure orders set initiated  - Continuous pulse oximetry with nasal cannula oxygen as needed to keep O2 saturations >92% - Strict I&Os and daily weights - Elevate lower extremities - Lasix 40 mg IV BID now stopped as Cr is worsening  - Continue to Re-assess Volume Status - Last Echocardiogram was 8/1 so will not repeat - Optimize medical management as able  Hypoxia secondary to pulmonary edema - Plan as seen above; C/w Diuresis - Improving   Type 2 NSTEMI from Demand Ischemia - Acute. In the setting of Cocaine and A Fib - Trend cardiac enzymes and trending down from 0.13 -> 0.04 - Currently denying Chest Pain   Polysubstance abuse - Counseled on the need of cessation  of Cocaine use  - UDS Positive for Cocaine - Social work consult for Resources  Hypertensive Urgency:  -Blood pressure is high as 172/116 on admission. -BP now is 158/80 -Cardiology starting on Cardizem 240 mg po Daily   CKD Stage 3 -BUN/Cr was 20/1.71 on Admission and went to 24/1.83 and now worsened to  39/2.39 -Continue to Monitor as she got diuresed and Avoid Nephrotoxics -Repeat CMP in AM  Hx of Breast Cancer  -No active Issue -S/p Lumpectomy, Radiation and Arimidex   Leukocytosis -Likely Reactive. WBC went from 6.1 -> 12.3 -CXR showed Cardiomegaly with central vascular congestion and bilateral effusions. Mild diffuse interstitial prominence suggesting mild background edema. More focal pulmonary opacities in the right and left upper lobes could relate to superimposed infiltrates. Atelectasis or infiltrates at the bilateral lung bases. -Urinalysis was Negative  -Will repeat CXR in AM  -Will obtain Blood Cx's x2; Patient Afebrile. -Continue to Monitor for S/Sx for Infection -Repeat CBC in AM   DVT prophylaxis: Enoxaparin 30 mg sq daily Code Status: FULL CODE Family Communication: No family present at bedside Disposition Plan: Remain in SDU for now and change to Tele in AM if stable  Consultants:   Cardiology   Procedures: None  Antimicrobials:  Anti-infectives    None     Subjective: Seen and examined and stated she was still a little SOB but no CP or Abdominal Pain. No lightheadedness or dizziness. No other concerns or complaints at this time.   Objective: Vitals:   03/25/17 0400 03/25/17 0415 03/25/17 0432 03/25/17 0842  BP: 123/69 132/77  (!) 158/80  Pulse: 98     Resp: (!) 26 (!) 26 (!) 32   Temp:   97.6 F (36.4 C)   TempSrc:   Oral   SpO2: 97%     Weight:   55.1 kg (121 lb 6.4 oz)   Height:        Intake/Output Summary (Last 24 hours) at 03/25/17 1525 Last data filed at 03/25/17 0600  Gross per 24 hour  Intake           761.06 ml  Output              400 ml  Net           361.06 ml   Filed Weights   03/23/17 2044 03/25/17 0432  Weight: 54.4 kg (120 lb) 55.1 kg (121 lb 6.4 oz)   Examination: Physical Exam:  Constitutional: Thin AAF in NAD laying in bed appears calm and comfortable. States she is a little SOB.  Eyes:  Sclerae anicteric. Lids  normal ENMT: Grossly normal hearing. MMM Neck: Supple with no JVD Respiratory: Diminished but no wheezing/rales/rhonchi. Patient was not tachypenic or using any supplemental O2  Cardiovascular: RRR; no m/r/g. No appreciable LE edema Abdomen: Soft, NT, ND; Bowel sounds present GU: Deferred Musculoskeletal: No contractures; No cyanosis Skin: Warm and dry. No rashes or lesions on a limited skin eval Neurologic: CN 2-12 grossly intact with no focal deficits. Romberg sign and cerebellar reflexes not assesed Psychiatric: Alert and oriented x3. Normal mood and affect  Data Reviewed: I have personally reviewed following labs and imaging studies  CBC:  Recent Labs Lab 03/23/17 2055 03/24/17 0539 03/25/17 0354  WBC 7.9 6.1 12.3*  NEUTROABS  --  5.4 9.8*  HGB 11.0* 12.0 11.5*  HCT 35.4* 38.2 37.1  MCV 88.3 88.4 89.2  PLT 394 374 696*   Basic Metabolic Panel:  Recent Labs Lab 03/23/17  2055 03/23/17 2058 03/24/17 0539 03/25/17 0354  NA 138  --  137 134*  K 3.7  --  4.0 4.7  CL 110  --  107 105  CO2 22  --  19* 21*  GLUCOSE 105*  --  260* 142*  BUN 20  --  24* 39*  CREATININE 1.71*  --  1.83* 2.39*  CALCIUM 8.6*  --  8.7* 8.6*  MG  --  1.8  --  1.7  PHOS  --   --   --  4.8*   GFR: Estimated Creatinine Clearance: 18.6 mL/min (A) (by C-G formula based on SCr of 2.39 mg/dL (H)). Liver Function Tests:  Recent Labs Lab 03/25/17 0354  AST 25  ALT 19  ALKPHOS 79  BILITOT 0.5  PROT 7.1  ALBUMIN 2.3*   No results for input(s): LIPASE, AMYLASE in the last 168 hours. No results for input(s): AMMONIA in the last 168 hours. Coagulation Profile:  Recent Labs Lab 03/23/17 2058  INR 0.98   Cardiac Enzymes:  Recent Labs Lab 03/24/17 0052 03/24/17 0539 03/24/17 1130  TROPONINI 0.06* 0.05* 0.04*   BNP (last 3 results) No results for input(s): PROBNP in the last 8760 hours. HbA1C: No results for input(s): HGBA1C in the last 72 hours. CBG: No results for input(s):  GLUCAP in the last 168 hours. Lipid Profile: No results for input(s): CHOL, HDL, LDLCALC, TRIG, CHOLHDL, LDLDIRECT in the last 72 hours. Thyroid Function Tests: No results for input(s): TSH, T4TOTAL, FREET4, T3FREE, THYROIDAB in the last 72 hours. Anemia Panel: No results for input(s): VITAMINB12, FOLATE, FERRITIN, TIBC, IRON, RETICCTPCT in the last 72 hours. Sepsis Labs: No results for input(s): PROCALCITON, LATICACIDVEN in the last 168 hours.  No results found for this or any previous visit (from the past 240 hour(s)).   Radiology Studies: Dg Chest Portable 1 View  Result Date: 03/23/2017 CLINICAL DATA:  Shortness of breath EXAM: PORTABLE CHEST 1 VIEW COMPARISON:  02/04/2017, 02/03/2017 FINDINGS: Bilateral pleural effusions. Cardiomegaly with aortic atherosclerosis. Central vascular congestion. Mild diffuse increased interstitial opacity suggesting mild background edema. Patchy focal opacities in the left and right upper lobes could reflect mild infiltrates. IMPRESSION: 1. Cardiomegaly with central vascular congestion and bilateral effusions. Mild diffuse interstitial prominence suggesting mild background edema 2. More focal pulmonary opacities in the right and left upper lobes could relate to superimposed infiltrates. Atelectasis or infiltrates at the bilateral lung bases. Electronically Signed   By: Donavan Foil M.D.   On: 03/23/2017 21:19   Scheduled Meds: . amiodarone  400 mg Oral BID  . aspirin EC  81 mg Oral Daily  . diltiazem  240 mg Oral Daily  . sodium chloride flush  3 mL Intravenous Q12H   Continuous Infusions: . sodium chloride       LOS: 2 days   Kerney Elbe, DO [facility] Pager [phone]  If 7PM-7AM, please contact night-coverage [email or web address] Password [facility] 03/25/2017, 3:25 PM

## 2017-03-25 NOTE — Progress Notes (Signed)
CSW consulted for patient housing needs. Patient reported she is living in transitional housing in [place] at a place called [facility]. Patient reports she has been there about 3 weeks and plans to be there for 6 months. Patient looking for permanent housing after that. CSW referred patient to [employer] and [employer]. CSW provided list of housing and shelter resources. Patient interested in outpatient substance use resources. CSW to follow up and provide list of these resources to patient.  Victoria Burke, [place]

## 2017-03-25 NOTE — Care Management Note (Signed)
Case Management Note Marvetta Gibbons RN, BSN Unit 4E-Case Manager [phone]  Patient Details  Name: Victoria Burke MRN: [id number] Date of Birth: 07/04/1951  Subjective/Objective:   Pt admitted with afib and HF, +cocaine use and ETOH                 Action/Plan: PTA pt lived at home- independent, CM to follow for d/c needs.   Expected Discharge Date:                  Expected Discharge Plan:  Home/Self Care  In-House Referral:     Discharge planning Services  CM Consult  Post Acute Care Choice:    Choice offered to:     DME Arranged:    DME Agency:     HH Arranged:    HH Agency:     Status of Service:  In process, will continue to follow  If discussed at Long Length of Stay Meetings, dates discussed:    Discharge Disposition:   Additional Comments:  Dawayne Patricia, RN 03/25/2017, 9:48 AM

## 2017-03-25 NOTE — Progress Notes (Signed)
Progress Note  Patient Name: Victoria Burke Date of Encounter: 03/25/2017  Primary Cardiologist: Marlou Porch  Subjective   No CP, no SOB currently  Inpatient Medications    Scheduled Meds: . furosemide  40 mg Intravenous BID  . sodium chloride flush  3 mL Intravenous Q12H   Continuous Infusions: . sodium chloride    . amiodarone 30 mg/hr (03/25/17 0207)  . diltiazem (CARDIZEM) infusion 5 mg/hr (03/25/17 0441)   PRN Meds: sodium chloride, acetaminophen, ondansetron (ZOFRAN) IV, sodium chloride flush   Vital Signs    Vitals:   03/25/17 0400 03/25/17 0415 03/25/17 0432 03/25/17 0842  BP: 123/69 132/77  (!) 158/80  Pulse: 98     Resp: (!) 26 (!) 26 (!) 32   Temp:   97.6 F (36.4 C)   TempSrc:   Oral   SpO2: 97%     Weight:   121 lb 6.4 oz (55.1 kg)   Height:        Intake/Output Summary (Last 24 hours) at 03/25/17 1016 Last data filed at 03/25/17 0600  Gross per 24 hour  Intake          1001.06 ml  Output              400 ml  Net           601.06 ml   Filed Weights   03/23/17 2044 03/25/17 0432  Weight: 120 lb (54.4 kg) 121 lb 6.4 oz (55.1 kg)    Telemetry    NSR now, AFIB to NSR with vent bigeminy transiently. - Personally Reviewed  ECG    AFIB RVR - Personally Reviewed  Physical Exam   GEN: Well nourished, well developed, in no acute distress  HEENT: normal  Neck: no JVD, carotid bruits, or masses Cardiac: RRR occasional ectopy; no murmurs, rubs, or gallops,no edema  Respiratory:  clear to auscultation bilaterally, normal work of breathing GI: soft, nontender, nondistended, + BS MS: no deformity or atrophy  Skin: warm and dry, no rash Neuro:  Alert and Oriented x 3, Strength and sensation are intact Psych: euthymic mood, full affect  Labs    Chemistry  Recent Labs Lab 03/23/17 2055 03/24/17 0539 03/25/17 0354  NA 138 137 134*  K 3.7 4.0 4.7  CL 110 107 105  CO2 22 19* 21*  GLUCOSE 105* 260* 142*  BUN 20 24* 39*  CREATININE 1.71*  1.83* 2.39*  CALCIUM 8.6* 8.7* 8.6*  PROT  --   --  7.1  ALBUMIN  --   --  2.3*  AST  --   --  25  ALT  --   --  19  ALKPHOS  --   --  79  BILITOT  --   --  0.5  GFRNONAA 30* 28* 20*  GFRAA 35* 32* 23*  ANIONGAP 6 11 8      Hematology  Recent Labs Lab 03/23/17 2055 03/24/17 0539 03/25/17 0354  WBC 7.9 6.1 12.3*  RBC 4.01 4.32 4.16  HGB 11.0* 12.0 11.5*  HCT 35.4* 38.2 37.1  MCV 88.3 88.4 89.2  MCH 27.4 27.8 27.6  MCHC 31.1 31.4 31.0  RDW 15.9* 15.5 15.7*  PLT 394 374 403*    Cardiac Enzymes  Recent Labs Lab 03/24/17 0052 03/24/17 0539 03/24/17 1130  TROPONINI 0.06* 0.05* 0.04*     Recent Labs Lab 03/23/17 2122 03/24/17 0106  TROPIPOC 0.13* 0.06     BNP  Recent Labs Lab 03/23/17 2058  BNP 1,041.9*  DDimer No results for input(s): DDIMER in the last 168 hours.   Radiology    Dg Chest Portable 1 View  Result Date: 03/23/2017 CLINICAL DATA:  Shortness of breath EXAM: PORTABLE CHEST 1 VIEW COMPARISON:  02/04/2017, 02/03/2017 FINDINGS: Bilateral pleural effusions. Cardiomegaly with aortic atherosclerosis. Central vascular congestion. Mild diffuse increased interstitial opacity suggesting mild background edema. Patchy focal opacities in the left and right upper lobes could reflect mild infiltrates. IMPRESSION: 1. Cardiomegaly with central vascular congestion and bilateral effusions. Mild diffuse interstitial prominence suggesting mild background edema 2. More focal pulmonary opacities in the right and left upper lobes could relate to superimposed infiltrates. Atelectasis or infiltrates at the bilateral lung bases. Electronically Signed   By: Donavan Foil M.D.   On: 03/23/2017 21:19    Cardiac Studies   ECHO 02/05/17  - Normal LV size with moderate LV hypertrophy. EF 55-60%. Moderate   diastolic dysfunction. Normal RV size and systolic function.   Moderate pulmonary hypertension. Mild-moderate mitral   regurgitation. Biatrial enlargement.  Patient  Profile     66 y.o. female with cocaine use here with PAF with RVR, normal EF  Assessment & Plan    PAF  - now NSR with occasional PVC  - Will change to PO Diltiazem CD 240 QD and PO AMIO  - Continue amio 400 BID for 5 days then 200 BID for 7 days, then 200 QD thereafter  - Her CHADS-VASC is at least 2 (F, Age). Would recommend anticoagulation but in the past this has not been given because of lack of compliance.   - Given her reduced GFR, would recommend coumadin when she is able to comply with medications and stop cocaine. She takes full responsibility for possible stroke.   - needs to stop ETOH as well  - will give ASA. No evidence however that this will help decrease her potential stroke risk.   Troponin elevation/demand ischemia  - in the setting of cocaine and afib.   - CAD was mild RCA and LAD dz previously.   - no CP  Acute diastolic HF  - improved with IV lasix. Stopped. Creatinine increased.    For questions or updates, please contact [place] Please consult [email or web address] for contact info under Cardiology/STEMI.      Signed, Candee Furbish, MD  03/25/2017, 10:16 AM

## 2017-03-26 ENCOUNTER — Inpatient Hospital Stay (HOSPITAL_COMMUNITY): Payer: Medicare Other

## 2017-03-26 DIAGNOSIS — F1410 Cocaine abuse, uncomplicated: Secondary | ICD-10-CM

## 2017-03-26 LAB — COMPREHENSIVE METABOLIC PANEL
ALBUMIN: 2.2 g/dL — AB (ref 3.5–5.0)
ALK PHOS: 83 U/L (ref 38–126)
ALT: 15 U/L (ref 14–54)
AST: 19 U/L (ref 15–41)
Anion gap: 5 (ref 5–15)
BUN: 35 mg/dL — ABNORMAL HIGH (ref 6–20)
CHLORIDE: 105 mmol/L (ref 101–111)
CO2: 27 mmol/L (ref 22–32)
Calcium: 8.8 mg/dL — ABNORMAL LOW (ref 8.9–10.3)
Creatinine, Ser: 1.71 mg/dL — ABNORMAL HIGH (ref 0.44–1.00)
GFR calc non Af Amer: 30 mL/min — ABNORMAL LOW (ref >60)
GFR, EST AFRICAN AMERICAN: 35 mL/min — AB (ref >60)
GLUCOSE: 101 mg/dL — AB (ref 65–99)
POTASSIUM: 3.7 mmol/L (ref 3.5–5.1)
SODIUM: 137 mmol/L (ref 135–145)
Total Bilirubin: 0.5 mg/dL (ref 0.3–1.2)
Total Protein: 6.6 g/dL (ref 6.5–8.1)

## 2017-03-26 LAB — CBC WITH DIFFERENTIAL/PLATELET
BASOS PCT: 0 %
Basophils Absolute: 0 10*3/uL (ref 0.0–0.1)
EOS ABS: 0 10*3/uL (ref 0.0–0.7)
EOS PCT: 0 %
HCT: 37.1 % (ref 36.0–46.0)
HEMOGLOBIN: 11.5 g/dL — AB (ref 12.0–15.0)
Lymphocytes Relative: 19 %
Lymphs Abs: 1.8 10*3/uL (ref 0.7–4.0)
MCH: 27.8 pg (ref 26.0–34.0)
MCHC: 31 g/dL (ref 30.0–36.0)
MCV: 89.6 fL (ref 78.0–100.0)
MONOS PCT: 9 %
Monocytes Absolute: 0.9 10*3/uL (ref 0.1–1.0)
NEUTROS PCT: 72 %
Neutro Abs: 6.8 10*3/uL (ref 1.7–7.7)
PLATELETS: 413 10*3/uL — AB (ref 150–400)
RBC: 4.14 MIL/uL (ref 3.87–5.11)
RDW: 15.7 % — ABNORMAL HIGH (ref 11.5–15.5)
WBC: 9.5 10*3/uL (ref 4.0–10.5)

## 2017-03-26 LAB — PHOSPHORUS: PHOSPHORUS: 4 mg/dL (ref 2.5–4.6)

## 2017-03-26 LAB — MAGNESIUM: Magnesium: 1.6 mg/dL — ABNORMAL LOW (ref 1.7–2.4)

## 2017-03-26 MED ORDER — ALBUTEROL SULFATE (2.5 MG/3ML) 0.083% IN NEBU
2.5000 mg | INHALATION_SOLUTION | RESPIRATORY_TRACT | Status: DC | PRN
  Administered 2017-03-26 – 2017-03-29 (×7): 2.5 mg via RESPIRATORY_TRACT
  Filled 2017-03-26 (×7): qty 3

## 2017-03-26 MED ORDER — ALBUTEROL SULFATE (2.5 MG/3ML) 0.083% IN NEBU
2.5000 mg | INHALATION_SOLUTION | RESPIRATORY_TRACT | Status: DC | PRN
  Administered 2017-03-26: 2.5 mg via RESPIRATORY_TRACT
  Filled 2017-03-26: qty 3

## 2017-03-26 MED ORDER — BISACODYL 5 MG PO TBEC: 5.0000 mg | DELAYED_RELEASE_TABLET | Freq: Every day | ORAL | Status: DC | PRN

## 2017-03-26 MED ORDER — FUROSEMIDE 40 MG PO TABS
40.0000 mg | ORAL_TABLET | Freq: Every day | ORAL | Status: DC
  Administered 2017-03-26 – 2017-03-28 (×3): 40 mg via ORAL
  Filled 2017-03-26 (×3): qty 1

## 2017-03-26 NOTE — Progress Notes (Signed)
PROGRESS NOTE    Victoria Burke  WVP:[id number] DOB: 1951-05-15 DOA: 03/23/2017 PCP: Dorena Dew, FNP   Brief Narrative 66 y.o.femalewith medical history significant of anemia, breast cancer, Afib, COPD, tobacco abuse, GERD, Gout, HLD, HTN, Breast Cancer, urothelial/bladder cancer, and polysubstance abuse; who presents with complaints of shortness breath for the last 2-3days. She accidentally all lost her medications while riding on the bus some time last Thursday. She reports not eating able to request refills since that time. Subsequently, reports using cocaine yesterday. Associated symptoms include dyspnea on exertion, nonproductive cough, orthopnea, and PND. Denies any significant chest pain. Upon admission into the emergency department patient was seen to be in A. fib with RVR with heart rates 164. Patient was placed on diltiazem drip with improvement of heart rates but remained uncontrolled. Cardiology evaluated and recommended IV Amiodarone. Cardiology also recommended Anticoagulation but because patient was non-compliant and still doing Cocaine was not started. Patient's HR converted to NSR and Cardiology adjusting medications.    Assessment & Plan:   Paroxsymal Atrial fibrillation with RVR and now in NSR-on diltiazem 240 mg qd and amiodarone 400 mg twice a day for 4 days then 200 mg twice a day for 7 days and then 200 mg daily. Continue with aspirin. Not started on Coumadin due to noncompliance with medications and ongoing use of cocaine and alcohol.  Acute diastolic heart failure on Lasix 40 mg daily.  Hypertension systolic blood pressure still in 150-160 range. Monitor and add a beta blocker if BP still not under control.  COPD/tobacco abuse start SVN treatments with albuterol. Patient has a productive cough with shortness of breath. Monitor recent chest x-ray from 9/16 noted. Recent chest x-ray showed central vascular congestion with bilateral effusion mild diffuse interstitial  prominence suggesting mild background edema and cardiomegaly. More focal pulmonary opacities the right and left upper lobes possible infiltrates versus atelectasis. Will follow up chest x-ray tomorrow.  Constipation stool softeners as needed  CK D stage III creatinine 1.71 today.   Assessment & Plan:   Principal Problem:   Atrial fibrillation with RVR (HCC) Active Problems:   Hypertensive urgency   Cocaine abuse   Acute on chronic diastolic congestive heart failure (HCC)   CAD (coronary artery disease), native coronary artery   Kidney disease, chronic, stage III (GFR 30-59 ml/min)   Aortic atherosclerosis (HCC)   Hypertensive heart disease   History of noncompliance with medical treatment   Paroxysmal atrial fibrillation (HCC)   Leukocytosis      DVT prophylaxis:  family Communication:  Disposition Plan:    Consultants:    Procedures:   Antimicrobials:    Subjective:   Objective: Vitals:   03/25/17 1946 03/26/17 0441 03/26/17 0547 03/26/17 0830  BP: (!) 160/86 (!) 158/83 (!) 157/77   Pulse: 64 64 (!) 57   Resp: (!) 24 (!) 22 20   Temp: 98.5 F (36.9 C) (!) 97.5 F (36.4 C)  97.9 F (36.6 C)  TempSrc: Oral Oral  Oral  SpO2: 94% 94% 95%   Weight:  54.6 kg (120 lb 6.4 oz)    Height:        Intake/Output Summary (Last 24 hours) at 03/26/17 1627 Last data filed at 03/26/17 0834  Gross per 24 hour  Intake              480 ml  Output             1300 ml  Net             -  820 ml   Filed Weights   03/23/17 2044 03/25/17 0432 03/26/17 0441  Weight: 54.4 kg (120 lb) 55.1 kg (121 lb 6.4 oz) 54.6 kg (120 lb 6.4 oz)    Examination:  General exam: Appears calm and comfortable  Respiratory system: Clear to auscultation. Respiratory effort normal. Cardiovascular system: S1 & S2 heard, RRR. No JVD, murmurs, rubs, gallops or clicks. No pedal edema. Gastrointestinal system: Abdomen is nondistended, soft and nontender. No organomegaly or masses felt. Normal  bowel sounds heard. Central nervous system: Alert and oriented. No focal neurological deficits. Extremities: Symmetric 5 x 5 power. Skin: No rashes, lesions or ulcers Psychiatry: Judgement and insight appear normal. Mood & affect appropriate.     Data Reviewed: I have personally reviewed following labs and imaging studies  CBC:  Recent Labs Lab 03/23/17 2055 03/24/17 0539 03/25/17 0354 03/26/17 0301  WBC 7.9 6.1 12.3* 9.5  NEUTROABS  --  5.4 9.8* 6.8  HGB 11.0* 12.0 11.5* 11.5*  HCT 35.4* 38.2 37.1 37.1  MCV 88.3 88.4 89.2 89.6  PLT 394 374 403* 833*   Basic Metabolic Panel:  Recent Labs Lab 03/23/17 2055 03/23/17 2058 03/24/17 0539 03/25/17 0354 03/26/17 0301  NA 138  --  137 134* 137  K 3.7  --  4.0 4.7 3.7  CL 110  --  107 105 105  CO2 22  --  19* 21* 27  GLUCOSE 105*  --  260* 142* 101*  BUN 20  --  24* 39* 35*  CREATININE 1.71*  --  1.83* 2.39* 1.71*  CALCIUM 8.6*  --  8.7* 8.6* 8.8*  MG  --  1.8  --  1.7 1.6*  PHOS  --   --   --  4.8* 4.0   GFR: Estimated Creatinine Clearance: 25.9 mL/min (A) (by C-G formula based on SCr of 1.71 mg/dL (H)). Liver Function Tests:  Recent Labs Lab 03/25/17 0354 03/26/17 0301  AST 25 19  ALT 19 15  ALKPHOS 79 83  BILITOT 0.5 0.5  PROT 7.1 6.6  ALBUMIN 2.3* 2.2*   No results for input(s): LIPASE, AMYLASE in the last 168 hours. No results for input(s): AMMONIA in the last 168 hours. Coagulation Profile:  Recent Labs Lab 03/23/17 2058  INR 0.98   Cardiac Enzymes:  Recent Labs Lab 03/24/17 0052 03/24/17 0539 03/24/17 1130  TROPONINI 0.06* 0.05* 0.04*   BNP (last 3 results) No results for input(s): PROBNP in the last 8760 hours. HbA1C: No results for input(s): HGBA1C in the last 72 hours. CBG: No results for input(s): GLUCAP in the last 168 hours. Lipid Profile: No results for input(s): CHOL, HDL, LDLCALC, TRIG, CHOLHDL, LDLDIRECT in the last 72 hours. Thyroid Function Tests: No results for  input(s): TSH, T4TOTAL, FREET4, T3FREE, THYROIDAB in the last 72 hours. Anemia Panel: No results for input(s): VITAMINB12, FOLATE, FERRITIN, TIBC, IRON, RETICCTPCT in the last 72 hours. Sepsis Labs: No results for input(s): PROCALCITON, LATICACIDVEN in the last 168 hours.  Recent Results (from the past 240 hour(s))  Culture, blood (routine x 2)     Status: None (Preliminary result)   Collection Time: 03/25/17  3:53 PM  Result Value Ref Range Status   Specimen Description BLOOD LEFT ARM  Final   Special Requests   Final    BOTTLES DRAWN AEROBIC ONLY Blood Culture adequate volume   Culture NO GROWTH < 24 HOURS  Final   Report Status PENDING  Incomplete  Culture, blood (routine x 2)  Status: None (Preliminary result)   Collection Time: 03/25/17  3:53 PM  Result Value Ref Range Status   Specimen Description BLOOD LEFT ARM  Final   Special Requests   Final    BOTTLES DRAWN AEROBIC ONLY Blood Culture adequate volume   Culture NO GROWTH < 24 HOURS  Final   Report Status PENDING  Incomplete         Radiology Studies: No results found.      Scheduled Meds: . amiodarone  400 mg Oral BID  . aspirin EC  81 mg Oral Daily  . diltiazem  240 mg Oral Daily  . furosemide  40 mg Oral Daily  . sodium chloride flush  3 mL Intravenous Q12H   Continuous Infusions: . sodium chloride       LOS: 3 days        Georgette Shell, MD [facility] Pager [phone]  If 7PM-7AM, please contact night-coverage [email or web address] Password [facility] 03/26/2017, 4:27 PM

## 2017-03-26 NOTE — Progress Notes (Signed)
Progress Note  Patient Name: Victoria Burke Date of Encounter: 03/26/2017  Primary Cardiologist: Marlou Porch  Subjective   No SOB, no CP  Inpatient Medications    Scheduled Meds: . amiodarone  400 mg Oral BID  . aspirin EC  81 mg Oral Daily  . diltiazem  240 mg Oral Daily  . sodium chloride flush  3 mL Intravenous Q12H   Continuous Infusions: . sodium chloride     PRN Meds: sodium chloride, acetaminophen, albuterol, ondansetron (ZOFRAN) IV, sodium chloride flush, zolpidem   Vital Signs    Vitals:   03/25/17 1946 03/26/17 0441 03/26/17 0547 03/26/17 0830  BP: (!) 160/86 (!) 158/83 (!) 157/77   Pulse: 64 64 (!) 57   Resp: (!) 24 (!) 22 20   Temp: 98.5 F (36.9 C) (!) 97.5 F (36.4 C)  97.9 F (36.6 C)  TempSrc: Oral Oral  Oral  SpO2: 94% 94% 95%   Weight:  120 lb 6.4 oz (54.6 kg)    Height:        Intake/Output Summary (Last 24 hours) at 03/26/17 1029 Last data filed at 03/26/17 0834  Gross per 24 hour  Intake              720 ml  Output             1800 ml  Net            -1080 ml   Filed Weights   03/23/17 2044 03/25/17 0432 03/26/17 0441  Weight: 120 lb (54.4 kg) 121 lb 6.4 oz (55.1 kg) 120 lb 6.4 oz (54.6 kg)    Telemetry    NSR now, AFIB to NSR with vent bigeminy transiently. - Personally Reviewed  ECG    AFIB RVR - Personally Reviewed  Physical Exam  GEN: Well nourished, well developed, in no acute distress  HEENT: normal  Neck: no JVD, carotid bruits, or masses Cardiac: RRR; no murmurs, rubs, or gallops,no edema  Respiratory:  clear to auscultation bilaterally, normal work of breathing GI: soft, nontender, nondistended, + BS MS: no deformity or atrophy  Skin: warm and dry, no rash Neuro:  Alert and Oriented x 3, Strength and sensation are intact Psych: euthymic mood, full affect   Labs    Chemistry  Recent Labs Lab 03/24/17 0539 03/25/17 0354 03/26/17 0301  NA 137 134* 137  K 4.0 4.7 3.7  CL 107 105 105  CO2 19* 21* 27    GLUCOSE 260* 142* 101*  BUN 24* 39* 35*  CREATININE 1.83* 2.39* 1.71*  CALCIUM 8.7* 8.6* 8.8*  PROT  --  7.1 6.6  ALBUMIN  --  2.3* 2.2*  AST  --  25 19  ALT  --  19 15  ALKPHOS  --  79 83  BILITOT  --  0.5 0.5  GFRNONAA 28* 20* 30*  GFRAA 32* 23* 35*  ANIONGAP 11 8 5      Hematology  Recent Labs Lab 03/24/17 0539 03/25/17 0354 03/26/17 0301  WBC 6.1 12.3* 9.5  RBC 4.32 4.16 4.14  HGB 12.0 11.5* 11.5*  HCT 38.2 37.1 37.1  MCV 88.4 89.2 89.6  MCH 27.8 27.6 27.8  MCHC 31.4 31.0 31.0  RDW 15.5 15.7* 15.7*  PLT 374 403* 413*    Cardiac Enzymes  Recent Labs Lab 03/24/17 0052 03/24/17 0539 03/24/17 1130  TROPONINI 0.06* 0.05* 0.04*     Recent Labs Lab 03/23/17 2122 03/24/17 0106  TROPIPOC 0.13* 0.06  BNP  Recent Labs Lab 03/23/17 2058  BNP 1,041.9*     DDimer No results for input(s): DDIMER in the last 168 hours.   Radiology    No results found.  Cardiac Studies   ECHO 02/05/17  - Normal LV size with moderate LV hypertrophy. EF 55-60%. Moderate   diastolic dysfunction. Normal RV size and systolic function.   Moderate pulmonary hypertension. Mild-moderate mitral   regurgitation. Biatrial enlargement.  Patient Profile     66 y.o. female with cocaine use here with PAF with RVR, normal EF  Assessment & Plan    PAF  - now NSR with occasional PVC  - Now on PO Diltiazem CD 240 QD and PO AMIO  - Continue amio 400 BID for 4 days then 200 BID for 7 days, then 200 QD thereafter  - Her CHADS-VASC is at least 2 (F, Age). Would recommend anticoagulation but in the past this has not been given because of lack of compliance.   - Given her reduced GFR, would recommend coumadin when she is able to comply with medications and stop cocaine. She takes full responsibility for possible stroke.   - needs to stop ETOH as well  - will give ASA. No evidence however that this will help decrease her potential stroke risk.   Troponin elevation/demand ischemia   - in the setting of cocaine and afib.   - CAD was mild RCA and LAD dz previously.   - no CP  - stable  - at increased CV morbidity with elevated troponin and cocaine use  Acute diastolic HF (normal EF)  - improved with IV lasix. Stopped. Creatinine increased but now back down.   - will start back lasix 40 PO QD  Hypertensive heart disease with heart failure  - BP control per primary team.   Will sign off. Call if any ?Marland Kitchen   For questions or updates, please contact [place] Please consult [email or web address] for contact info under Cardiology/STEMI.      Signed, Candee Furbish, MD  03/26/2017, 10:29 AM

## 2017-03-26 NOTE — Progress Notes (Signed)
Cardiology on call MD paged twice about patient's BP. Awaiting new orders.

## 2017-03-27 LAB — BASIC METABOLIC PANEL
ANION GAP: 9 (ref 5–15)
BUN: 25 mg/dL — ABNORMAL HIGH (ref 6–20)
CHLORIDE: 106 mmol/L (ref 101–111)
CO2: 22 mmol/L (ref 22–32)
Calcium: 8.3 mg/dL — ABNORMAL LOW (ref 8.9–10.3)
Creatinine, Ser: 1.19 mg/dL — ABNORMAL HIGH (ref 0.44–1.00)
GFR calc non Af Amer: 47 mL/min — ABNORMAL LOW (ref >60)
GFR, EST AFRICAN AMERICAN: 54 mL/min — AB (ref >60)
Glucose, Bld: 98 mg/dL (ref 65–99)
Potassium: 3.4 mmol/L — ABNORMAL LOW (ref 3.5–5.1)
SODIUM: 137 mmol/L (ref 135–145)

## 2017-03-27 MED ORDER — HYDRALAZINE HCL 50 MG PO TABS
50.0000 mg | ORAL_TABLET | Freq: Three times a day (TID) | ORAL | Status: DC
  Administered 2017-03-27 – 2017-03-30 (×10): 50 mg via ORAL
  Filled 2017-03-27 (×10): qty 1

## 2017-03-27 NOTE — Care Management Important Message (Signed)
Important Message  Patient Details  Name: Victoria Burke MRN: [id number] Date of Birth: 1950-11-06   Medicare Important Message Given:  Yes    Jenniffer Vessels Abena 03/27/2017, 10:26 AM

## 2017-03-27 NOTE — Progress Notes (Signed)
PROGRESS NOTE    Victoria Burke  WNU:[id number] DOB: May 24, 1951 DOA: 03/23/2017 PCP: Dorena Dew, FNP   Brief Narrative: 65 y.o.femalewith medical history significant of anemia, breast cancer, Afib, COPD, tobacco abuse, GERD, Gout, HLD, HTN, Breast Cancer, urothelial/bladder cancer, and polysubstance abuse; who presents with complaints of shortness breath for the last 2-3days. She accidentally all lost her medications while riding on the bus some time last Thursday. She reports not eating able to request refills since that time. Subsequently, reports using cocaine yesterday. Associated symptoms include dyspnea on exertion, nonproductive cough, orthopnea, and PND. Denies any significant chest pain. Upon admission into the emergency department patient was seen to be in A. fib with RVR with heart rates 164. Patient was placed on diltiazem drip with improvement of heart rates but remained uncontrolled. Cardiology evaluated and recommended IV Amiodarone. Cardiology also recommended Anticoagulation but because patient was non-compliant and still doing Cocaine was not started. Patient's HR converted to NSR and Cardiology adjusting medications.heart rate in 60s.complaining of shortness of breath.   Assessment & Plan:   Principal Problem:   Atrial fibrillation with RVR (HCC) Active Problems:   Hypertensive urgency   Cocaine abuse   Acute on chronic diastolic congestive heart failure (HCC)   CAD (coronary artery disease), native coronary artery   Kidney disease, chronic, stage III (GFR 30-59 ml/min)   Aortic atherosclerosis (HCC)   Hypertensive heart disease   History of noncompliance with medical treatment   Paroxysmal atrial fibrillation (HCC)   Leukocytosis  Paroxsymal Atrial fibrillation with RVR and now in NSR-on diltiazem 240 mg qd and amiodarone 400 mg twice a day for 4 days then 200 mg twice a day for 7 days and then 200 mg daily. Continue with aspirin. Not started on Coumadin due to  noncompliance with medications and ongoing use of cocaine and alcohol.  Acute diastolic heart failure on Lasix 40 mg daily.  Hypertension systolic blood pressure still in 150-160 range. Monitor and add a beta blocker if BP still not under control.  COPD/tobacco abuse start SVN treatments with albuterol. Patient has a productive cough with shortness of breath. Monitor recent chest x-ray from 9/16 noted. Recent chest x-ray showed central vascular congestion with bilateral effusion mild diffuse interstitial prominence suggesting mild background edema and cardiomegaly. More focal pulmonary opacities the right and left upper lobes possible infiltrates versus atelectasis. Will follow up chest x-ray today.  Constipation stool softeners as needed  CK D stage III stable     DVT prophylaxis:  Code Statusfull Family Communication:  Disposition Plan: tbd   Consultantscardio  Procedures: none  Antimicrobials: none   Subjective:co sob   Objective: Vitals:   03/27/17 0603 03/27/17 0820 03/27/17 1056 03/27/17 1200  BP: (!) 166/91 (!) 181/94 (!) 176/88 (!) 174/84  Pulse: (!) 58 64  67  Resp:  (!) 23  (!) 25  Temp: 98.1 F (36.7 C) 97.7 F (36.5 C)  98 F (36.7 C)  TempSrc: Oral Oral  Oral  SpO2: 97% 97%  96%  Weight: 54.8 kg (120 lb 12.8 oz)     Height:        Intake/Output Summary (Last 24 hours) at 03/27/17 1557 Last data filed at 03/26/17 1700  Gross per 24 hour  Intake              240 ml  Output                0 ml  Net  240 ml   Filed Weights   03/25/17 0432 03/26/17 0441 03/27/17 0603  Weight: 55.1 kg (121 lb 6.4 oz) 54.6 kg (120 lb 6.4 oz) 54.8 kg (120 lb 12.8 oz)    Examination:  General exam: Appears calm and comfortable  Respiratory system: Clear to auscultation. Respiratory effort normal. Cardiovascular system: S1 & S2 heard, RRR. No JVD, murmurs, rubs, gallops or clicks. No pedal edema. Gastrointestinal system: Abdomen is nondistended,  soft and nontender. No organomegaly or masses felt. Normal bowel sounds heard. Central nervous system: Alert and oriented. No focal neurological deficits. Extremities: Symmetric 5 x 5 power. Skin: No rashes, lesions or ulcers Psychiatry: Judgement and insight appear normal. Mood & affect appropriate.     Data Reviewed: I have personally reviewed following labs and imaging studies  CBC:  Recent Labs Lab 03/23/17 2055 03/24/17 0539 03/25/17 0354 03/26/17 0301  WBC 7.9 6.1 12.3* 9.5  NEUTROABS  --  5.4 9.8* 6.8  HGB 11.0* 12.0 11.5* 11.5*  HCT 35.4* 38.2 37.1 37.1  MCV 88.3 88.4 89.2 89.6  PLT 394 374 403* 542*   Basic Metabolic Panel:  Recent Labs Lab 03/23/17 2055 03/23/17 2058 03/24/17 0539 03/25/17 0354 03/26/17 0301 03/27/17 0322  NA 138  --  137 134* 137 137  K 3.7  --  4.0 4.7 3.7 3.4*  CL 110  --  107 105 105 106  CO2 22  --  19* 21* 27 22  GLUCOSE 105*  --  260* 142* 101* 98  BUN 20  --  24* 39* 35* 25*  CREATININE 1.71*  --  1.83* 2.39* 1.71* 1.19*  CALCIUM 8.6*  --  8.7* 8.6* 8.8* 8.3*  MG  --  1.8  --  1.7 1.6*  --   PHOS  --   --   --  4.8* 4.0  --    GFR: Estimated Creatinine Clearance: 37.3 mL/min (A) (by C-G formula based on SCr of 1.19 mg/dL (H)). Liver Function Tests:  Recent Labs Lab 03/25/17 0354 03/26/17 0301  AST 25 19  ALT 19 15  ALKPHOS 79 83  BILITOT 0.5 0.5  PROT 7.1 6.6  ALBUMIN 2.3* 2.2*   No results for input(s): LIPASE, AMYLASE in the last 168 hours. No results for input(s): AMMONIA in the last 168 hours. Coagulation Profile:  Recent Labs Lab 03/23/17 2058  INR 0.98   Cardiac Enzymes:  Recent Labs Lab 03/24/17 0052 03/24/17 0539 03/24/17 1130  TROPONINI 0.06* 0.05* 0.04*   BNP (last 3 results) No results for input(s): PROBNP in the last 8760 hours. HbA1C: No results for input(s): HGBA1C in the last 72 hours. CBG: No results for input(s): GLUCAP in the last 168 hours. Lipid Profile: No results for  input(s): CHOL, HDL, LDLCALC, TRIG, CHOLHDL, LDLDIRECT in the last 72 hours. Thyroid Function Tests: No results for input(s): TSH, T4TOTAL, FREET4, T3FREE, THYROIDAB in the last 72 hours. Anemia Panel: No results for input(s): VITAMINB12, FOLATE, FERRITIN, TIBC, IRON, RETICCTPCT in the last 72 hours. Sepsis Labs: No results for input(s): PROCALCITON, LATICACIDVEN in the last 168 hours.  Recent Results (from the past 240 hour(s))  Culture, blood (routine x 2)     Status: None (Preliminary result)   Collection Time: 03/25/17  3:53 PM  Result Value Ref Range Status   Specimen Description BLOOD LEFT ARM  Final   Special Requests   Final    BOTTLES DRAWN AEROBIC ONLY Blood Culture adequate volume   Culture NO GROWTH 2 DAYS  Final   Report Status PENDING  Incomplete  Culture, blood (routine x 2)     Status: None (Preliminary result)   Collection Time: 03/25/17  3:53 PM  Result Value Ref Range Status   Specimen Description BLOOD LEFT ARM  Final   Special Requests   Final    BOTTLES DRAWN AEROBIC ONLY Blood Culture adequate volume   Culture NO GROWTH 2 DAYS  Final   Report Status PENDING  Incomplete         Radiology Studies: Dg Chest 2 View  Result Date: 03/26/2017 CLINICAL DATA:  Shortness of breath, COPD, CHF EXAM: CHEST  2 VIEW COMPARISON:  03/23/2017 FINDINGS: Cardiomegaly evident with diffuse mild interstitial edema pattern and pleural effusions, larger on the right. Pattern compatible with CHF. Associated bibasilar atelectasis/consolidation. No pneumothorax. Atherosclerosis of the aorta. Trachea is midline. IMPRESSION: CHF pattern similar to the prior study. Electronically Signed   By: Jerilynn Mages.  Shick M.D.   On: 03/26/2017 21:59        Scheduled Meds: . amiodarone  400 mg Oral BID  . aspirin EC  81 mg Oral Daily  . diltiazem  240 mg Oral Daily  . furosemide  40 mg Oral Daily  . hydrALAZINE  50 mg Oral Q8H  . sodium chloride flush  3 mL Intravenous Q12H   Continuous  Infusions: . sodium chloride       LOS: 4 days        Georgette Shell, MD [facility]  If 7PM-7AM, please contact night-coverage [email or web address] Password [facility] 03/27/2017, 3:57 PM

## 2017-03-28 LAB — BASIC METABOLIC PANEL
ANION GAP: 7 (ref 5–15)
BUN: 21 mg/dL — ABNORMAL HIGH (ref 6–20)
CALCIUM: 8.3 mg/dL — AB (ref 8.9–10.3)
CO2: 27 mmol/L (ref 22–32)
CREATININE: 1.28 mg/dL — AB (ref 0.44–1.00)
Chloride: 103 mmol/L (ref 101–111)
GFR, EST AFRICAN AMERICAN: 50 mL/min — AB (ref >60)
GFR, EST NON AFRICAN AMERICAN: 43 mL/min — AB (ref >60)
Glucose, Bld: 147 mg/dL — ABNORMAL HIGH (ref 65–99)
Potassium: 3.2 mmol/L — ABNORMAL LOW (ref 3.5–5.1)
SODIUM: 137 mmol/L (ref 135–145)

## 2017-03-28 LAB — URIC ACID: URIC ACID, SERUM: 9.2 mg/dL — AB (ref 2.3–6.6)

## 2017-03-28 MED ORDER — LORAZEPAM 0.5 MG PO TABS
0.5000 mg | ORAL_TABLET | Freq: Once | ORAL | Status: AC
  Administered 2017-03-28: 0.5 mg via ORAL
  Filled 2017-03-28: qty 1

## 2017-03-28 MED ORDER — COLCHICINE 0.6 MG PO TABS: 0.6000 mg | ORAL_TABLET | Freq: Three times a day (TID) | ORAL | Status: DC

## 2017-03-28 MED ORDER — POTASSIUM CHLORIDE CRYS ER 20 MEQ PO TBCR
40.0000 meq | EXTENDED_RELEASE_TABLET | Freq: Once | ORAL | Status: AC
  Administered 2017-03-28: 40 meq via ORAL
  Filled 2017-03-28: qty 2

## 2017-03-28 MED ORDER — FUROSEMIDE 40 MG PO TABS
40.0000 mg | ORAL_TABLET | Freq: Two times a day (BID) | ORAL | Status: DC
  Administered 2017-03-28 – 2017-03-30 (×4): 40 mg via ORAL
  Filled 2017-03-28 (×4): qty 1

## 2017-03-28 MED ORDER — METOPROLOL TARTRATE 5 MG/5ML IV SOLN
5.0000 mg | Freq: Once | INTRAVENOUS | Status: AC
  Administered 2017-03-28: 5 mg via INTRAVENOUS
  Filled 2017-03-28: qty 5

## 2017-03-28 MED ORDER — COLCHICINE 0.6 MG PO TABS
0.6000 mg | ORAL_TABLET | Freq: Three times a day (TID) | ORAL | Status: AC
  Administered 2017-03-28 – 2017-03-29 (×3): 0.6 mg via ORAL
  Filled 2017-03-28 (×3): qty 1

## 2017-03-28 NOTE — Evaluation (Signed)
Physical Therapy Evaluation Patient Details Name: Victoria Burke MRN: [id number] DOB: 1951/05/13 Today's Date: 03/28/2017   History of Present Illness  Pt is a 66 y.o. female who presents with complaints of shortness of breath for 2-3 days after accidentally losing all of her medications while riding a bus the week before admission. She does report utilizing cocaine the day prior to admission. Found to be in A-fib with RVR in the ED. PMH significant for anemia, breast cancer, Afib, COPD, tobacco abuse, GERD, Gout, HLD, HTN, Breast Cancer, urothelial/bladder cancer, and polysubstance abuse.  Clinical Impression  Pt admitted with above diagnosis. Pt currently with functional limitations due to the deficits listed below (see PT Problem List). Pt is currently mod I for bed mobility, supervision for transfers and min guard for ambulation of 75 fet with RW. Pt attempted ambulation without AD and experienced 1xLoB requiring minA to steady due to gout in L great toe. Pt agrees to using RW for safety while experiencing foot pain. Feel pt would benefit from continued PT services while admitted to improve mobility and improve strength and endurance prior to return home. Do not anticipate need for PT follow-up post-acute D/C. PT will continue to follow while admitted.     Follow Up Recommendations No PT follow up;Supervision for mobility/OOB    Equipment Recommendations  Rolling walker with 5" wheels;3in1 (PT)       Precautions / Restrictions Precautions Precautions: Fall Restrictions Weight Bearing Restrictions: No      Mobility  Bed Mobility Overal bed mobility: Needs Assistance Bed Mobility: Supine to Sit     Supine to sit: Modified independent (Device/Increase time)     General bed mobility comments: increased time  Transfers Overall transfer level: Needs assistance Equipment used: None Transfers: Sit to/from Stand Sit to Stand: Supervision         General transfer comment:  Supervision for safety.   Ambulation/Gait Ambulation/Gait assistance: Min guard;Min assist Ambulation Distance (Feet): 75 Feet Assistive device: Rolling walker (2 wheeled);None Gait Pattern/deviations: Step-through pattern;Decreased weight shift to left;Decreased stance time - left;Antalgic Gait velocity: slowed Gait velocity interpretation: Below normal speed for age/gender General Gait Details: minA for ambulation without AD, to steady for 1xLoB due to gout pain in L great toe, min guard for ambulation with RW, increased UE support to offweight L LE      Balance Overall balance assessment: Needs assistance Sitting-balance support: No upper extremity supported;Feet supported Sitting balance-Leahy Scale: Good     Standing balance support: Bilateral upper extremity supported;No upper extremity supported;During functional activity;Single extremity supported Standing balance-Leahy Scale: Fair Standing balance comment: able to static stand without UE support requires UE assist with dynamic activity                             Pertinent Vitals/Pain Pain Assessment: 0-10 Pain Score: 8  Pain Location: L great toe, gout Pain Descriptors / Indicators: Aching;Discomfort Pain Intervention(s): Limited activity within patient's tolerance;Monitored during session    Home Living Family/patient expects to be discharged to:: Private residence Living Arrangements: Non-relatives/Friends (recently had roommate move in) Available Help at Discharge: Family Type of Home: Apartment Home Access: Level entry     Home Layout: One level Home Equipment: None      Prior Function Level of Independence: Independent         Comments: Uses public transportation and goes to classes from 10-2.         Extremity/Trunk Assessment  Upper Extremity Assessment Upper Extremity Assessment: Generalized weakness    Lower Extremity Assessment Lower Extremity Assessment: Generalized  weakness       Communication   Communication: No difficulties  Cognition Arousal/Alertness: Awake/alert Behavior During Therapy: WFL for tasks assessed/performed Overall Cognitive Status: Within Functional Limits for tasks assessed                                 General Comments: Slow to respond at times      General Comments General comments (skin integrity, edema, etc.): Pt on 3L O2 via nasal cannula, SaO2 97%O2, on RA SaO2 91%O2, with ambulation on RA SaO2 dropped to 70%O2 (with very poor waveform), 3L/min O2 via nasal cannula replaced after ambulation at SaO2 rebounded to 98%O2        Assessment/Plan    PT Assessment Patient needs continued PT services  PT Problem List Decreased strength;Decreased activity tolerance;Decreased balance;Decreased mobility;Pain       PT Treatment Interventions DME instruction;Gait training;Functional mobility training;Therapeutic activities;Therapeutic exercise;Balance training;Patient/family education    PT Goals (Current goals can be found in the Care Plan section)  Acute Rehab PT Goals Patient Stated Goal: to go home soon PT Goal Formulation: With patient Time For Goal Achievement: 04/11/17 Potential to Achieve Goals: Good    Frequency Min 3X/week    AM-PAC PT "6 Clicks" Daily Activity  Outcome Measure Difficulty turning over in bed (including adjusting bedclothes, sheets and blankets)?: A Little Difficulty moving from lying on back to sitting on the side of the bed? : A Little Difficulty sitting down on and standing up from a chair with arms (e.g., wheelchair, bedside commode, etc,.)?: A Little Help needed moving to and from a bed to chair (including a wheelchair)?: None Help needed walking in hospital room?: A Little Help needed climbing 3-5 steps with a railing? : A Little 6 Click Score: 19    End of Session Equipment Utilized During Treatment: Gait belt Activity Tolerance: Patient limited by pain Patient left:  in bed;with call bell/phone within reach Nurse Communication: Mobility status PT Visit Diagnosis: Unsteadiness on feet (R26.81);Other abnormalities of gait and mobility (R26.89);Muscle weakness (generalized) (M62.81);Difficulty in walking, not elsewhere classified (R26.2);Pain Pain - Right/Left: Left Pain - part of body:  (great toe)    Time: 1308-6578 PT Time Calculation (min) (ACUTE ONLY): 18 min   Charges:   PT Evaluation $PT Eval Low Complexity: 1 Low     PT G Codes:        Senay Sistrunk B. Migdalia Dk PT, DPT Acute Rehabilitation  [phone] Pager [phone]    [place] 03/28/2017, 3:13 PM

## 2017-03-28 NOTE — Progress Notes (Signed)
RT called to room by RN for breathing treatment. Upon arrival patient was using accessory muscles to breath, RR was 29, and was labored. Patient stated she felt like she was having panic attack, one was beginning. BS are clear throughout. Once treatment started patient WOB went down and is now regular, unlabored, and symmetrical. RR is now 22. Sats are 98% 3LNC. Assessment was completed and patient still qualifies for PRN txs. Patient is resting comfortably, RT will continue to monitor.

## 2017-03-28 NOTE — Evaluation (Signed)
Occupational Therapy Evaluation Patient Details Name: Victoria Burke MRN: [id number] DOB: [birth year] Today's Date: 03/28/2017    History of Present Illness Pt is a 66 y.o. female who presents with complaints of shortness of breath for 2-3 days after accidentally losing all of her medications while riding a bus the week before admission. She does report utilizing cocaine the day prior to admission. Found to be in A-fib with RVR in the ED. PMH significant for anemia, breast cancer, Afib, COPD, tobacco abuse, GERD, Gout, HLD, HTN, Breast Cancer, urothelial/bladder cancer, and polysubstance abuse.   Clinical Impression   PTA, pt was independent with ADL and functional mobility. She currently requires min guard assist for toilet transfers, supervision for standing grooming tasks. She presents with decreased activity tolerance for ADL as well as generalized weakness impacting her ability to participate in ADL at [facility] Feel pt would benefit from continued OT services while admitted to improve independence and safety with ADL and functional mobility tasks prior to return home. She reports that her roommate is able to provide assistance as needed post-acute D/C. Do not anticipate need for OT follow-up post-acute D/C. OT will continue to follow while admitted.     Follow Up Recommendations  No OT follow up;Supervision - Intermittent    Equipment Recommendations  3 in 1 bedside commode;Tub/shower seat    Recommendations for Other Services       Precautions / Restrictions Precautions Precautions: Fall Restrictions Weight Bearing Restrictions: No      Mobility Bed Mobility Overal bed mobility: Needs Assistance Bed Mobility: Supine to Sit     Supine to sit: Modified independent (Device/Increase time)     General bed mobility comments: increased time  Transfers Overall transfer level: Needs assistance Equipment used: None Transfers: Sit to/from Stand Sit to Stand: Supervision          General transfer comment: Supervision for safety.     Balance Overall balance assessment: Needs assistance Sitting-balance support: No upper extremity supported;Feet supported Sitting balance-Leahy Scale: Good     Standing balance support: Bilateral upper extremity supported;No upper extremity supported;During functional activity;Single extremity supported Standing balance-Leahy Scale: Fair Standing balance comment: Statically able to stand without UE support but supports self on furniture during dynamic activities.                            ADL either performed or assessed with clinical judgement   ADL Overall ADL's : Needs assistance/impaired Eating/Feeding: Set up;Sitting   Grooming: Supervision/safety;Standing   Upper Body Bathing: Set up;Sitting   Lower Body Bathing: Supervison/ safety;Sit to/from stand   Upper Body Dressing : Set up;Sitting   Lower Body Dressing: Supervision/safety;Sit to/from stand   Toilet Transfer: Min guard;Ambulation;BSC Toilet Transfer Details (indicate cue type and reason): Pt reaching for furniture while ambulating in room to sink.  Toileting- Clothing Manipulation and Hygiene: Supervision/safety;Sit to/from stand       Functional mobility during ADLs: Min guard General ADL Comments: Pt fatigues easily and demonstrates decreased activity tolerance for ADL.      Vision Patient Visual Report: No change from baseline Vision Assessment?: No apparent visual deficits     Perception     Praxis      Pertinent Vitals/Pain Pain Assessment: Faces Faces Pain Scale: Hurts little more Pain Location: generalized Pain Descriptors / Indicators: Aching;Discomfort Pain Intervention(s): Limited activity within patient's tolerance;Monitored during session     Hand Dominance     Extremity/Trunk Assessment Upper  Extremity Assessment Upper Extremity Assessment: Generalized weakness   Lower Extremity Assessment Lower Extremity  Assessment: Generalized weakness       Communication Communication Communication: No difficulties   Cognition Arousal/Alertness: Awake/alert Behavior During Therapy: WFL for tasks assessed/performed Overall Cognitive Status: Within Functional Limits for tasks assessed                                 General Comments: Slow to respond at times   General Comments  VSS    Exercises     Shoulder Instructions      Home Living Family/patient expects to be discharged to:: Private residence Living Arrangements: Non-relatives/Friends (recently had roommate move in) Available Help at Discharge: Family Type of Home: Apartment Home Access: Level entry     Home Layout: One level     Bathroom Shower/Tub: Teacher, early years/pre: Standard     Home Equipment: None          Prior Functioning/Environment Level of Independence: Independent        Comments: Uses public transportation and goes to classes from 10-2.         OT Problem List: Decreased strength;Decreased activity tolerance;Impaired balance (sitting and/or standing);Decreased safety awareness;Decreased knowledge of use of DME or AE;Pain      OT Treatment/Interventions: Self-care/ADL training;Therapeutic exercise;Energy conservation;DME and/or AE instruction;Therapeutic activities;Patient/family education;Balance training    OT Goals(Current goals can be found in the care plan section) Acute Rehab OT Goals Patient Stated Goal: to go home soon OT Goal Formulation: With patient Time For Goal Achievement: 04/11/17 Potential to Achieve Goals: Good ADL Goals Pt Will Perform Grooming: with modified independence;standing Pt Will Perform Upper Body Dressing: with modified independence;sitting Pt Will Perform Lower Body Dressing: with modified independence;sit to/from stand Pt Will Transfer to Toilet: with modified independence;ambulating;regular height toilet;bedside commode Pt Will Perform  Toileting - Clothing Manipulation and hygiene: with modified independence;sit to/from stand Pt Will Perform Tub/Shower Transfer: with modified independence;ambulating;Tub transfer;shower seat Additional ADL Goal #1: Pt will verbalize 3 strategies to conserve energy during morning ADL routine independently.   OT Frequency: Min 2X/week   Barriers to D/C:            Co-evaluation              AM-PAC PT "6 Clicks" Daily Activity     Outcome Measure Help from another person eating meals?: A Little Help from another person taking care of personal grooming?: A Little Help from another person toileting, which includes using toliet, bedpan, or urinal?: A Little Help from another person bathing (including washing, rinsing, drying)?: A Little Help from another person to put on and taking off regular upper body clothing?: A Little Help from another person to put on and taking off regular lower body clothing?: A Little 6 Click Score: 18   End of Session Equipment Utilized During Treatment: Gait belt;Oxygen (3LO2) Nurse Communication: Mobility status  Activity Tolerance: Patient tolerated treatment well Patient left: in chair;with call bell/phone within reach  OT Visit Diagnosis: Other abnormalities of gait and mobility (R26.89);Muscle weakness (generalized) (M62.81)                Time: 5366-4403 OT Time Calculation (min): 29 min Charges:  OT General Charges $OT Visit: 1 Visit OT Evaluation $OT Eval Moderate Complexity: 1 Mod OT Treatments $Self Care/Home Management : 8-22 mins G-Codes:     Emoni Yang A Verania Salberg, MS OTR/L  Pager: [place] 03/28/2017, 10:58 AM

## 2017-03-28 NOTE — Progress Notes (Signed)
PROGRESS NOTE    Victoria Burke  WTU:[id number] DOB: 1951-03-25 DOA: 03/23/2017 PCP: Dorena Dew, FNP    Brief Narrative:  66 y.o.femalewith medical history significant of anemia, breast cancer, Afib, COPD, tobacco abuse, GERD, Gout, HLD, HTN, Breast Cancer, urothelial/bladder cancer, and polysubstance abuse; who presents with complaints of shortness breath for the last 2-3days. She accidentally all lost her medications while riding on the bus some time last Thursday. She reports not eating able to request refills since that time. Subsequently, reports using cocaine yesterday. Associated symptoms include dyspnea on exertion, nonproductive cough, orthopnea, and PND. Denies any significant chest pain. Upon admission into the emergency department patient was seen to be in A. fib with RVR with heart rates 164. Patient was placed on diltiazem drip with improvement of heart rates but remained uncontrolled. Cardiology evaluated and recommended IV Amiodarone. Cardiology also recommended Anticoagulation but because patient was non-compliant and still doing Cocaine was not started. Patient's HR converted to NSR and Cardiology adjusting medications.heart rate in 60s.complaining of shortness of breath.     Assessment & Plan:   Principal Problem:   Atrial fibrillation with RVR (HCC) Active Problems:   Hypertensive urgency   Cocaine abuse   Acute on chronic diastolic congestive heart failure (HCC)   CAD (coronary artery disease), native coronary artery   Kidney disease, chronic, stage III (GFR 30-59 ml/min)   Aortic atherosclerosis (HCC)   Hypertensive heart disease   History of noncompliance with medical treatment   Paroxysmal atrial fibrillation (HCC)   Leukocytosis  Paroxsymal Atrial fibrillation with RVR and now in NSR-on diltiazem 240 mg qd andamiodarone 400 mg twice a day for 4 days then 200 mg twice a day for 7 days and then 200 mg daily. Continue with aspirin. Not started on  Coumadin due to noncompliance with medications and ongoing use of cocaine and alcohol.  Acute diastolic heart failure on Lasix 40 mg daily.chest xray still with fluid overload.increase lasix 40 mg bid.  Hypertension systolic blood pressure still in 150-160 range. Monitor and add a beta blocker if BP still not under control.  COPD/tobacco abuse start SVN treatments with albuterol. Patient has a productive cough with shortness of breath. Monitor recent chest x-ray from 9/16 noted. Recent chest x-ray showed central vascular congestion with bilateral effusion mild diffuse interstitial prominence suggesting mild background edema and cardiomegaly. More focal pulmonary opacities the right and left upper lobes possible infiltrates versus atelectasis. Will follow up chest x-ray today.  Constipation stool softeners as needed  ckd WITH HYPOKALEMIA replete kdur.    DVT prophylaxis:  Code Status: full Family Communication:  Disposition Plan:    Consultants: card  Procedures: none  Antimicrobials: none   Subjective:feels sob on exertion.   Objective: Vitals:   03/28/17 0513 03/28/17 0646 03/28/17 0800 03/28/17 1215  BP: (!) 168/91 (!) 159/92 (!) 181/95 (!) 192/92  Pulse:  (!) 56 63 70  Resp:  (!) 29 (!) 27 (!) 21  Temp:      TempSrc:      SpO2:  97% 100% 100%  Weight:      Height:        Intake/Output Summary (Last 24 hours) at 03/28/17 1310 Last data filed at 03/28/17 0851  Gross per 24 hour  Intake                0 ml  Output              600 ml  Net             -  600 ml   Filed Weights   03/26/17 0441 03/27/17 0603 03/28/17 0454  Weight: 54.6 kg (120 lb 6.4 oz) 54.8 kg (120 lb 12.8 oz) 53.3 kg (117 lb 9.6 oz)    Examination:  General exam: Appears calm and comfortable  Respiratory system: Clear to auscultation. Respiratory effort normal. Cardiovascular system: S1 & S2 heard, RRR. No JVD, murmurs, rubs, gallops or clicks. No pedal edema. Gastrointestinal system:  Abdomen is nondistended, soft and nontender. No organomegaly or masses felt. Normal bowel sounds heard. Central nervous system: Alert and oriented. No focal neurological deficits. Extremities: Symmetric 5 x 5 power. Skin: No rashes, lesions or ulcers Psychiatry: Judgement and insight appear normal. Mood & affect appropriate.     Data Reviewed: I have personally reviewed following labs and imaging studies  CBC:  Recent Labs Lab 03/23/17 2055 03/24/17 0539 03/25/17 0354 03/26/17 0301  WBC 7.9 6.1 12.3* 9.5  NEUTROABS  --  5.4 9.8* 6.8  HGB 11.0* 12.0 11.5* 11.5*  HCT 35.4* 38.2 37.1 37.1  MCV 88.3 88.4 89.2 89.6  PLT 394 374 403* 250*   Basic Metabolic Panel:  Recent Labs Lab 03/23/17 2058 03/24/17 0539 03/25/17 0354 03/26/17 0301 03/27/17 0322 03/28/17 0239  NA  --  137 134* 137 137 137  K  --  4.0 4.7 3.7 3.4* 3.2*  CL  --  107 105 105 106 103  CO2  --  19* 21* 27 22 27   GLUCOSE  --  260* 142* 101* 98 147*  BUN  --  24* 39* 35* 25* 21*  CREATININE  --  1.83* 2.39* 1.71* 1.19* 1.28*  CALCIUM  --  8.7* 8.6* 8.8* 8.3* 8.3*  MG 1.8  --  1.7 1.6*  --   --   PHOS  --   --  4.8* 4.0  --   --    GFR: Estimated Creatinine Clearance: 34.7 mL/min (A) (by C-G formula based on SCr of 1.28 mg/dL (H)). Liver Function Tests:  Recent Labs Lab 03/25/17 0354 03/26/17 0301  AST 25 19  ALT 19 15  ALKPHOS 79 83  BILITOT 0.5 0.5  PROT 7.1 6.6  ALBUMIN 2.3* 2.2*   No results for input(s): LIPASE, AMYLASE in the last 168 hours. No results for input(s): AMMONIA in the last 168 hours. Coagulation Profile:  Recent Labs Lab 03/23/17 2058  INR 0.98   Cardiac Enzymes:  Recent Labs Lab 03/24/17 0052 03/24/17 0539 03/24/17 1130  TROPONINI 0.06* 0.05* 0.04*   BNP (last 3 results) No results for input(s): PROBNP in the last 8760 hours. HbA1C: No results for input(s): HGBA1C in the last 72 hours. CBG: No results for input(s): GLUCAP in the last 168 hours. Lipid  Profile: No results for input(s): CHOL, HDL, LDLCALC, TRIG, CHOLHDL, LDLDIRECT in the last 72 hours. Thyroid Function Tests: No results for input(s): TSH, T4TOTAL, FREET4, T3FREE, THYROIDAB in the last 72 hours. Anemia Panel: No results for input(s): VITAMINB12, FOLATE, FERRITIN, TIBC, IRON, RETICCTPCT in the last 72 hours. Sepsis Labs: No results for input(s): PROCALCITON, LATICACIDVEN in the last 168 hours.  Recent Results (from the past 240 hour(s))  Culture, blood (routine x 2)     Status: None (Preliminary result)   Collection Time: 03/25/17  3:53 PM  Result Value Ref Range Status   Specimen Description BLOOD LEFT ARM  Final   Special Requests   Final    BOTTLES DRAWN AEROBIC ONLY Blood Culture adequate volume   Culture NO GROWTH 2 DAYS  Final   Report Status PENDING  Incomplete  Culture, blood (routine x 2)     Status: None (Preliminary result)   Collection Time: 03/25/17  3:53 PM  Result Value Ref Range Status   Specimen Description BLOOD LEFT ARM  Final   Special Requests   Final    BOTTLES DRAWN AEROBIC ONLY Blood Culture adequate volume   Culture NO GROWTH 2 DAYS  Final   Report Status PENDING  Incomplete         Radiology Studies: Dg Chest 2 View  Result Date: 03/26/2017 CLINICAL DATA:  Shortness of breath, COPD, CHF EXAM: CHEST  2 VIEW COMPARISON:  03/23/2017 FINDINGS: Cardiomegaly evident with diffuse mild interstitial edema pattern and pleural effusions, larger on the right. Pattern compatible with CHF. Associated bibasilar atelectasis/consolidation. No pneumothorax. Atherosclerosis of the aorta. Trachea is midline. IMPRESSION: CHF pattern similar to the prior study. Electronically Signed   By: Jerilynn Mages.  Shick M.D.   On: 03/26/2017 21:59        Scheduled Meds: . amiodarone  400 mg Oral BID  . aspirin EC  81 mg Oral Daily  . diltiazem  240 mg Oral Daily  . furosemide  40 mg Oral Daily  . hydrALAZINE  50 mg Oral Q8H  . potassium chloride  40 mEq Oral Once  .  sodium chloride flush  3 mL Intravenous Q12H   Continuous Infusions: . sodium chloride       LOS: 5 days     Georgette Shell, MD [facility]  If 7PM-7AM, please contact night-coverage [email or web address] Password [email or web address] 03/28/2017, 1:10 PM

## 2017-03-28 NOTE — Care Management Note (Signed)
Case Management Note Marvetta Gibbons RN, BSN Unit 4E-Case Manager [phone]  Patient Details  Name: Victoria Burke MRN: [id number] Date of Birth: 07-05-1951  Subjective/Objective:   Pt admitted with afib and HF, +cocaine use and ETOH                 Action/Plan: PTA pt lived at home- independent, CM to follow for d/c needs.   Expected Discharge Date:                  Expected Discharge Plan:  Home/Self Care  In-House Referral:  Clinical Social Work  Discharge planning Services  CM Consult  Post Acute Care Choice:  Durable Medical Equipment Choice offered to:     DME Arranged:    DME Agency:     HH Arranged:    [place] Agency:     Status of Service:  In process, will continue to follow  If discussed at Long Length of Stay Meetings, dates discussed:    Discharge Disposition:   Additional Comments:  03/28/17- 1545- Marvetta Gibbons RN, CM- spoke with pt at bedside for d/c needs- per pt she will need transportation assistance with cab- does not know how to use bus that far. - will need CSW assistance with this at time of discharge- pt also reports that she feels like she would benefit from a nebulizer at home- will need to discuss with MD- and MD will need to order DME-nebulizer for discharge- pt currently on 02 if unable to wean would need to qualify for home 02 and would need order for DME 02- will watch to see if pt able to wean off 02- per pt she uses [facility] does not have any difficulty affording meds. PCP is [place] to continue to follow for d/c needs.   Dawayne Patricia, RN 03/28/2017, 3:57 PM

## 2017-03-29 MED ORDER — AMIODARONE HCL 200 MG PO TABS
200.0000 mg | ORAL_TABLET | Freq: Two times a day (BID) | ORAL | Status: DC
  Administered 2017-03-29 – 2017-03-30 (×2): 200 mg via ORAL
  Filled 2017-03-29 (×2): qty 1

## 2017-03-29 MED ORDER — HYDRALAZINE HCL 20 MG/ML IJ SOLN: 10.0000 mg | INTRAMUSCULAR | Status: DC | PRN

## 2017-03-29 MED ORDER — INDOMETHACIN 25 MG PO CAPS
50.0000 mg | ORAL_CAPSULE | Freq: Three times a day (TID) | ORAL | Status: DC
  Administered 2017-03-30 (×2): 50 mg via ORAL
  Filled 2017-03-29 (×4): qty 2

## 2017-03-29 NOTE — Progress Notes (Signed)
PROGRESS NOTE    Victoria Burke  HGD:[id number] DOB: [birth year] DOA: 03/23/2017 PCP: Dorena Dew, FNP   Brief Narrative: y.o.femalewith medical history significant of anemia, breast cancer, Afib, COPD, tobacco abuse, GERD, Gout, HLD, HTN, Breast Cancer, urothelial/bladder cancer, and polysubstance abuse; who presents with complaints of shortness breath for the last 2-3days. She accidentally all lost her medications while riding on the bus some time last Thursday. She reports not eating able to request refills since that time. Subsequently, reports using cocaine yesterday. Associated symptoms include dyspnea on exertion, nonproductive cough, orthopnea, and PND. Denies any significant chest pain. Upon admission into the emergency department patient was seen to be in A. fib with RVR with heart rates 164. Patient was placed on diltiazem drip with improvement of heart rates but remained uncontrolled. Cardiology evaluated and recommended IV Amiodarone. Cardiology also recommended Anticoagulation but because patient was non-compliant and still doing Cocaine was not started. Patient's HR converted to NSR and Cardiology adjusting medications.heart rate in 60s.complaining of shortness of breath.lasix dose increased to 40 bid.patient requesting for oxygen upon dc and nebulizer treatments and asking something for anxiety.       Assessment & Plan:   Principal Problem:   Atrial fibrillation with RVR (HCC) Active Problems:   Hypertensive urgency   Cocaine abuse   Acute on chronic diastolic congestive heart failure (HCC)   CAD (coronary artery disease), native coronary artery   Kidney disease, chronic, stage III (GFR 30-59 ml/min)   Aortic atherosclerosis (HCC)   Hypertensive heart disease   History of noncompliance with medical treatment   Paroxysmal atrial fibrillation (HCC)   Leukocytosis  Paroxsymal Atrial fibrillation with RVR and now in NSR-on diltiazem 240 mg qd andamiodarone 400  mg twice a day for 4 days then 200 mg twice a day for 7 days and then 200 mg daily. Continue with aspirin. Not started on Coumadin due to noncompliance with medications and ongoing use of cocaine and alcohol.  Acute diastolic heart failure on Lasix 40 mg daily.chest xray still with fluid overload.increase lasix 40 mg bid.  Hypertension systolic blood pressure still in 150-160 range. Monitor and add a beta blocker if BP still not under control.  COPD/tobacco abuse start SVN treatments with albuterol. Patient has a productive cough with shortness of breath. Monitor recent chest x-ray from 9/16 noted. Recent chest x-ray showed central vascular congestion with bilateral effusion mild diffuse interstitial prominence suggesting mild background edema and cardiomegaly. More focal pulmonary opacities the right and left upper lobes possible infiltrates versus atelectasis. Will follow up chest x-ray today.  Constipation stool softeners as needed    DVT prophylaxis:  Code Status Family Communication:  Disposition Plan: plan dc home tomorrow.   Consultants:  Procedures:   Antimicrobials:    Subjective:  Objective: Vitals:   03/28/17 2300 03/28/17 2316 03/29/17 0402 03/29/17 1301  BP:  (!) 162/85 (!) 160/89   Pulse: 62 62 60   Resp: (!) 29 (!) 28 (!) 24   Temp:  98 F (36.7 C) 98.2 F (36.8 C)   TempSrc:  Oral Oral   SpO2: 95% 97% 91% 92%  Weight:   53.3 kg (117 lb 9.6 oz)   Height:        Intake/Output Summary (Last 24 hours) at 03/29/17 1308 Last data filed at 03/29/17 1208  Gross per 24 hour  Intake              360 ml  Output  1300 ml  Net             -940 ml   Filed Weights   03/27/17 0603 03/28/17 0454 03/29/17 0402  Weight: 54.8 kg (120 lb 12.8 oz) 53.3 kg (117 lb 9.6 oz) 53.3 kg (117 lb 9.6 oz)    Examination:  General exam: Appears calm and comfortable  Respiratory system: Clear to auscultation. Respiratory effort normal. Cardiovascular system: S1  & S2 heard, RRR. No JVD, murmurs, rubs, gallops or clicks.trace pedal edema.decreased breath sounds b/l Gastrointestinal system: Abdomen is nondistended, soft and nontender. No organomegaly or masses felt. Normal bowel sounds heard. Central nervous system: Alert and oriented. No focal neurological deficits. Extremities: Symmetric 5 x 5 power. Skin: No rashes, lesions or ulcers Psychiatry: Judgement and insight appear normal. Mood & affect appropriate.     Data Reviewed: I have personally reviewed following labs and imaging studies  CBC:  Recent Labs Lab 03/23/17 2055 03/24/17 0539 03/25/17 0354 03/26/17 0301  WBC 7.9 6.1 12.3* 9.5  NEUTROABS  --  5.4 9.8* 6.8  HGB 11.0* 12.0 11.5* 11.5*  HCT 35.4* 38.2 37.1 37.1  MCV 88.3 88.4 89.2 89.6  PLT 394 374 403* 856*   Basic Metabolic Panel:  Recent Labs Lab 03/23/17 2058 03/24/17 0539 03/25/17 0354 03/26/17 0301 03/27/17 0322 03/28/17 0239  NA  --  137 134* 137 137 137  K  --  4.0 4.7 3.7 3.4* 3.2*  CL  --  107 105 105 106 103  CO2  --  19* 21* 27 22 27   GLUCOSE  --  260* 142* 101* 98 147*  BUN  --  24* 39* 35* 25* 21*  CREATININE  --  1.83* 2.39* 1.71* 1.19* 1.28*  CALCIUM  --  8.7* 8.6* 8.8* 8.3* 8.3*  MG 1.8  --  1.7 1.6*  --   --   PHOS  --   --  4.8* 4.0  --   --    GFR: Estimated Creatinine Clearance: 34.7 mL/min (A) (by C-G formula based on SCr of 1.28 mg/dL (H)). Liver Function Tests:  Recent Labs Lab 03/25/17 0354 03/26/17 0301  AST 25 19  ALT 19 15  ALKPHOS 79 83  BILITOT 0.5 0.5  PROT 7.1 6.6  ALBUMIN 2.3* 2.2*   No results for input(s): LIPASE, AMYLASE in the last 168 hours. No results for input(s): AMMONIA in the last 168 hours. Coagulation Profile:  Recent Labs Lab 03/23/17 2058  INR 0.98   Cardiac Enzymes:  Recent Labs Lab 03/24/17 0052 03/24/17 0539 03/24/17 1130  TROPONINI 0.06* 0.05* 0.04*   BNP (last 3 results) No results for input(s): PROBNP in the last 8760  hours. HbA1C: No results for input(s): HGBA1C in the last 72 hours. CBG: No results for input(s): GLUCAP in the last 168 hours. Lipid Profile: No results for input(s): CHOL, HDL, LDLCALC, TRIG, CHOLHDL, LDLDIRECT in the last 72 hours. Thyroid Function Tests: No results for input(s): TSH, T4TOTAL, FREET4, T3FREE, THYROIDAB in the last 72 hours. Anemia Panel: No results for input(s): VITAMINB12, FOLATE, FERRITIN, TIBC, IRON, RETICCTPCT in the last 72 hours. Sepsis Labs: No results for input(s): PROCALCITON, LATICACIDVEN in the last 168 hours.  Recent Results (from the past 240 hour(s))  Culture, blood (routine x 2)     Status: None (Preliminary result)   Collection Time: 03/25/17  3:53 PM  Result Value Ref Range Status   Specimen Description BLOOD LEFT ARM  Final   Special Requests   Final  BOTTLES DRAWN AEROBIC ONLY Blood Culture adequate volume   Culture NO GROWTH 4 DAYS  Final   Report Status PENDING  Incomplete  Culture, blood (routine x 2)     Status: None (Preliminary result)   Collection Time: 03/25/17  3:53 PM  Result Value Ref Range Status   Specimen Description BLOOD LEFT ARM  Final   Special Requests   Final    BOTTLES DRAWN AEROBIC ONLY Blood Culture adequate volume   Culture NO GROWTH 4 DAYS  Final   Report Status PENDING  Incomplete         Radiology Studies: No results found.      Scheduled Meds: . amiodarone  400 mg Oral BID  . aspirin EC  81 mg Oral Daily  . diltiazem  240 mg Oral Daily  . furosemide  40 mg Oral BID  . hydrALAZINE  50 mg Oral Q8H  . sodium chloride flush  3 mL Intravenous Q12H   Continuous Infusions: . sodium chloride       LOS: 6 days      Georgette Shell, MD [facility]  If 7PM-7AM, please contact night-coverage [email or web address] Password [email or web address] 03/29/2017, 1:08 PM

## 2017-03-29 NOTE — Progress Notes (Signed)
Patient's oxygen saturations have been on mid-upper 90's on room air. I went in patient room to plug telemetry back up and patient was on 2 liters of oxygen.  I asked patient and she stated that the doctor put it back on. I reduced to 1 liter and will come back later and place on room air. I will replace pulse ox sensor after patient bathes. Patient is currently bathing. Call bell within reach.  Will continue to monitor. Payton Emerald, RN

## 2017-03-29 NOTE — Plan of Care (Signed)
Problem: Health Behavior/Discharge Planning: Goal: Ability to manage health-related needs will improve Outcome: Progressing Social work pending

## 2017-03-30 LAB — CULTURE, BLOOD (ROUTINE X 2)
CULTURE: NO GROWTH
CULTURE: NO GROWTH
SPECIAL REQUESTS: ADEQUATE
Special Requests: ADEQUATE

## 2017-03-30 MED ORDER — AMIODARONE HCL 200 MG PO TABS: 200.0000 mg | ORAL_TABLET | Freq: Two times a day (BID) | ORAL | 0 refills | Status: DC

## 2017-03-30 MED ORDER — COLCHICINE 0.6 MG PO TABS: 0.6000 mg | ORAL_TABLET | Freq: Every day | ORAL | 2 refills | Status: DC

## 2017-03-30 MED ORDER — FUROSEMIDE 40 MG PO TABS: 40.0000 mg | ORAL_TABLET | Freq: Two times a day (BID) | ORAL | 1 refills | Status: DC

## 2017-03-30 MED ORDER — HYDRALAZINE HCL 50 MG PO TABS: 100.0000 mg | ORAL_TABLET | Freq: Three times a day (TID) | ORAL | 0 refills | Status: DC

## 2017-03-30 MED ORDER — INDOMETHACIN 50 MG PO CAPS: 50.0000 mg | ORAL_CAPSULE | Freq: Three times a day (TID) | ORAL | 0 refills | Status: DC | PRN

## 2017-03-30 NOTE — Care Management Note (Signed)
Case Management Note Marvetta Gibbons RN, BSN Unit 4E-Case Manager [phone]  Patient Details  Name: Victoria Burke MRN: [id number] Date of Birth: [birth year]  Subjective/Objective:   Pt admitted with afib and HF, +cocaine use and ETOH                 Action/Plan: PTA pt lived at home- independent, CM to follow for d/c needs.   Expected Discharge Date:  03/30/17               Expected Discharge Plan:  [place]  In-House Referral:  Clinical Social Work  Discharge planning Services  CM Consult  Post Acute Care Choice:  Home Health Choice offered to:  Patient  DME Arranged:  N/A DME Agency:  NA  HH Arranged:  RN, PT [place] Agency:  [place]  Status of Service:  Completed, signed off  If discussed at [place] of Stay Meetings, dates discussed:    Discharge Disposition: home/self care   Additional Comments:  03/30/17- 1300- Marvetta Gibbons RN, CM- pt for d/c home today- orders placed for HHRN/PT- spoke with pt at bedside- choice offered for [facility] agency- per pt she selected [facility] for [facility] services- pt having difficulty remembering address- call made to son and left message- bedside RN received call back from son- and address give- [place]- son states he will not be able to come get pt- CSW will assist with transportation- have spoken with [place]. Referral for [facility] called to [facility] with [facility] for RN/PT.    03/28/17- 1545- Marvetta Gibbons RN, CM- spoke with pt at bedside for d/c needs- per pt she will need transportation assistance with cab- does not know how to use bus that far. - will need CSW assistance with this at time of discharge- pt also reports that she feels like she would benefit from a nebulizer at home- will need to discuss with MD- and MD will need to order DME-nebulizer for discharge- pt currently on 02 if unable to wean would need to qualify for home 02 and would need order for DME 02- will watch to see if pt able to  wean off 02- per pt she uses [facility] does not have any difficulty affording meds. PCP is [place] to continue to follow for d/c needs.   Dawayne Patricia, RN 03/30/2017, 1:26 PM

## 2017-03-30 NOTE — Progress Notes (Signed)
Called son Francesco Sor at [phone] to get address and son stated he would get dressed and come get her.  Son's wife called to say the he would not be able to come.  Son states and address is : [place].  Will call CSW and CM  To make aware. Payton Emerald, RN

## 2017-03-30 NOTE — Progress Notes (Signed)
Patient given charger to charge phone.  Patient will call when phone is charged sufficiently to get home address.  Patient states she needs a taxi voucher because the bus does not go to her house on Sunday. Pt resting with call bell within reach.  Will continue to monitor. Will call CSW when updated. Payton Emerald, RN

## 2017-03-30 NOTE — Progress Notes (Signed)
CSW spoke with patient via bedside regarding concerns with transportation once discharged. Patient stated she recently moved into new housing-however was unsure of address/ location. Patient stated this new address was [place] Patient stated she could determine address if she could charge her phone however patient did not have a charger and could not find cell phone. CSW requested what type of charge patient would need- patient would not give information to CSW. CSW updated RN regarding patient not being able to find cell phone. CSW informed patient a bus pass could be provided for transportation.   Kingsley Spittle, [facility] Emergency Room Clinical Social Worker [phone] '

## 2017-03-30 NOTE — Progress Notes (Signed)
Ambulated in hallway with oxygen saturations 88-100% on room air.  Initially, sats 88 with low pleth reading.  Asked patient to take a few deep breaths, continued ambulating in hallway and saturations went to 100% on room air.  Returned to room and saturations mid to uppers 90's.  CM came to ask this RN about need for oxygen. Patient had placed the oxygen back on.  Patient would like for home but does not qualify.

## 2017-03-30 NOTE — Discharge Summary (Signed)
Physician Discharge Summary  DE LIBMAN YJE:[id number] DOB: 1951-03-29 DOA: 03/23/2017  PCP: Dorena Dew, FNP  Admit date: 03/23/2017 Discharge date: 03/30/2017  Admitted From: home Disposition:  home  Recommendations for Outpatient Follow-up:  1. Follow up with PCP in 1-2 weeks 2. Please obtain BMP/CBC in one week  Home Healthyes Equipment/Devices none  Discharge Condition stable    Discharge Diagnoses:  Principal Problem:   Atrial fibrillation with RVR ([place]) Active Problems:   Hypertensive urgency   Cocaine abuse   Acute on chronic diastolic congestive heart failure (HCC)   CAD (coronary artery disease), native coronary artery   Kidney disease, chronic, stage III (GFR 30-59 ml/min)   Aortic atherosclerosis (HCC)   Hypertensive heart disease   History of noncompliance with medical treatment   Paroxysmal atrial fibrillation (HCC)   Leukocytosis Paroxsymal Atrial fibrillation with RVR and now in NSR-on diltiazem 240 mg qd andamiodarone 400 mg twice a day for 4 days then 200 mg twice a day for 7 days and then 200 mg daily. Continue with aspirin. Not started on Coumadin due to noncompliance with medications and ongoing use of cocaine and alcohol.  Acute diastolic heart failure on Lasix 40 mg daily.chest xray still with fluid overload.increase lasix 40 mg bid.  Hypertension systolic blood pressure still in 150-160 range. Monitor and add a beta blocker if BP still not under control.  COPD/tobacco abuse start SVN treatments with albuterol. Patient has a productive cough with shortness of breath. Monitor recent chest x-ray from 9/16 noted. Recent chest x-ray showed central vascular congestion with bilateral effusion mild diffuse interstitial prominence suggesting mild background edema and cardiomegaly. More focal pulmonary opacities the right and left upper lobes possible infiltrates versus atelectasis. Will follow up chest x-ray today.  Constipation stool softeners  as needed   Discharge Instructions follow up with pcp.cardiology.     No Known Allergies  Consultations:    Procedures/Studies: Dg Chest 2 View  Result Date: 03/26/2017 CLINICAL DATA:  Shortness of breath, COPD, CHF EXAM: CHEST  2 VIEW COMPARISON:  03/23/2017 FINDINGS: Cardiomegaly evident with diffuse mild interstitial edema pattern and pleural effusions, larger on the right. Pattern compatible with CHF. Associated bibasilar atelectasis/consolidation. No pneumothorax. Atherosclerosis of the aorta. Trachea is midline. IMPRESSION: CHF pattern similar to the prior study. Electronically Signed   By: Jerilynn Mages.  Shick M.D.   On: 03/26/2017 21:59   Dg Chest Portable 1 View  Result Date: 03/23/2017 CLINICAL DATA:  Shortness of breath EXAM: PORTABLE CHEST 1 VIEW COMPARISON:  02/04/2017, 02/03/2017 FINDINGS: Bilateral pleural effusions. Cardiomegaly with aortic atherosclerosis. Central vascular congestion. Mild diffuse increased interstitial opacity suggesting mild background edema. Patchy focal opacities in the left and right upper lobes could reflect mild infiltrates. IMPRESSION: 1. Cardiomegaly with central vascular congestion and bilateral effusions. Mild diffuse interstitial prominence suggesting mild background edema 2. More focal pulmonary opacities in the right and left upper lobes could relate to superimposed infiltrates. Atelectasis or infiltrates at the bilateral lung bases. Electronically Signed   By: Donavan Foil M.D.   On: 03/23/2017 21:19    (Echo, Carotid, EGD, Colonoscopy, ERCP)    Subjective:   Discharge Exam: Vitals:   03/30/17 0906 03/30/17 0918  BP:  (!) 172/84  Pulse: 81   Resp:    Temp: 98.5 F (36.9 C)   SpO2: 93%    Vitals:   03/29/17 2304 03/30/17 0457 03/30/17 0906 03/30/17 0918  BP: (!) 160/80 (!) 160/92  (!) 172/84  Pulse:  76 81  Resp:  17    Temp: 98.8 F (37.1 C) 99.2 F (37.3 C) 98.5 F (36.9 C)   TempSrc: Oral Oral Oral   SpO2:  92% 93%   Weight:   51.7 kg (114 lb)    Height:        General: Pt is alert, awake, not in acute distress Cardiovascular: RRR, S1/S2 +, no rubs, no gallops Respiratory: CTA bilaterally, no wheezing, no rhonchi Abdominal: Soft, NT, ND, bowel sounds + Extremities: no edema, no cyanosis    The results of significant diagnostics from this hospitalization (including imaging, microbiology, ancillary and laboratory) are listed below for reference.     Microbiology: Recent Results (from the past 240 hour(s))  Culture, blood (routine x 2)     Status: None (Preliminary result)   Collection Time: 03/25/17  3:53 PM  Result Value Ref Range Status   Specimen Description BLOOD LEFT ARM  Final   Special Requests   Final    BOTTLES DRAWN AEROBIC ONLY Blood Culture adequate volume   Culture NO GROWTH 4 DAYS  Final   Report Status PENDING  Incomplete  Culture, blood (routine x 2)     Status: None (Preliminary result)   Collection Time: 03/25/17  3:53 PM  Result Value Ref Range Status   Specimen Description BLOOD LEFT ARM  Final   Special Requests   Final    BOTTLES DRAWN AEROBIC ONLY Blood Culture adequate volume   Culture NO GROWTH 4 DAYS  Final   Report Status PENDING  Incomplete     Labs: BNP (last 3 results)  Recent Labs  10/15/16 1815 02/03/17 1046 03/23/17 2058  BNP 651.5* 1,567.3* 1,601.0*   Basic Metabolic Panel:  Recent Labs Lab 03/23/17 2058 03/24/17 0539 03/25/17 0354 03/26/17 0301 03/27/17 0322 03/28/17 0239  NA  --  137 134* 137 137 137  K  --  4.0 4.7 3.7 3.4* 3.2*  CL  --  107 105 105 106 103  CO2  --  19* 21* 27 22 27   GLUCOSE  --  260* 142* 101* 98 147*  BUN  --  24* 39* 35* 25* 21*  CREATININE  --  1.83* 2.39* 1.71* 1.19* 1.28*  CALCIUM  --  8.7* 8.6* 8.8* 8.3* 8.3*  MG 1.8  --  1.7 1.6*  --   --   PHOS  --   --  4.8* 4.0  --   --    Liver Function Tests:  Recent Labs Lab 03/25/17 0354 03/26/17 0301  AST 25 19  ALT 19 15  ALKPHOS 79 83  BILITOT 0.5 0.5  PROT  7.1 6.6  ALBUMIN 2.3* 2.2*   No results for input(s): LIPASE, AMYLASE in the last 168 hours. No results for input(s): AMMONIA in the last 168 hours. CBC:  Recent Labs Lab 03/23/17 2055 03/24/17 0539 03/25/17 0354 03/26/17 0301  WBC 7.9 6.1 12.3* 9.5  NEUTROABS  --  5.4 9.8* 6.8  HGB 11.0* 12.0 11.5* 11.5*  HCT 35.4* 38.2 37.1 37.1  MCV 88.3 88.4 89.2 89.6  PLT 394 374 403* 413*   Cardiac Enzymes:  Recent Labs Lab 03/24/17 0052 03/24/17 0539 03/24/17 1130  TROPONINI 0.06* 0.05* 0.04*   BNP: Invalid input(s): POCBNP CBG: No results for input(s): GLUCAP in the last 168 hours. D-Dimer No results for input(s): DDIMER in the last 72 hours. Hgb A1c No results for input(s): HGBA1C in the last 72 hours. Lipid Profile No results for input(s): CHOL, HDL, LDLCALC, TRIG, CHOLHDL,  LDLDIRECT in the last 72 hours. Thyroid function studies No results for input(s): TSH, T4TOTAL, T3FREE, THYROIDAB in the last 72 hours.  Invalid input(s): FREET3 Anemia work up No results for input(s): VITAMINB12, FOLATE, FERRITIN, TIBC, IRON, RETICCTPCT in the last 72 hours. Urinalysis    Component Value Date/Time   COLORURINE YELLOW 03/23/2017 2057   APPEARANCEUR HAZY (A) 03/23/2017 2057   LABSPEC 1.013 03/23/2017 2057   PHURINE 5.0 03/23/2017 2057   GLUCOSEU NEGATIVE 03/23/2017 2057   HGBUR NEGATIVE 03/23/2017 2057   BILIRUBINUR NEGATIVE 03/23/2017 2057   KETONESUR NEGATIVE 03/23/2017 2057   PROTEINUR >=300 (A) 03/23/2017 2057   UROBILINOGEN 0.2 10/31/2016 1050   NITRITE NEGATIVE 03/23/2017 2057   LEUKOCYTESUR NEGATIVE 03/23/2017 2057   Sepsis Labs Invalid input(s): PROCALCITONIN,  WBC,  LACTICIDVEN Microbiology Recent Results (from the past 240 hour(s))  Culture, blood (routine x 2)     Status: None (Preliminary result)   Collection Time: 03/25/17  3:53 PM  Result Value Ref Range Status   Specimen Description BLOOD LEFT ARM  Final   Special Requests   Final    BOTTLES DRAWN  AEROBIC ONLY Blood Culture adequate volume   Culture NO GROWTH 4 DAYS  Final   Report Status PENDING  Incomplete  Culture, blood (routine x 2)     Status: None (Preliminary result)   Collection Time: 03/25/17  3:53 PM  Result Value Ref Range Status   Specimen Description BLOOD LEFT ARM  Final   Special Requests   Final    BOTTLES DRAWN AEROBIC ONLY Blood Culture adequate volume   Culture NO GROWTH 4 DAYS  Final   Report Status PENDING  Incomplete     Time coordinating discharge: Over 30 minutes  SIGNED:   Georgette Shell, MD  [facility] 03/30/2017, 11:50 AM  If 7PM-7AM, please contact night-coverage [email or web address] Password [email or web address]

## 2017-03-31 ENCOUNTER — Ambulatory Visit: Payer: Medicare Other | Admitting: Hematology

## 2017-04-01 ENCOUNTER — Telehealth: Payer: Self-pay | Admitting: Hematology

## 2017-04-01 NOTE — Telephone Encounter (Signed)
Scheduled appt per 9/24 sch message - left message on vmail with appt date and time.

## 2017-04-06 ENCOUNTER — Emergency Department (HOSPITAL_COMMUNITY): Payer: Medicare Other

## 2017-04-06 ENCOUNTER — Encounter (HOSPITAL_COMMUNITY): Payer: Self-pay | Admitting: *Deleted

## 2017-04-06 ENCOUNTER — Inpatient Hospital Stay (HOSPITAL_COMMUNITY)
Admission: EM | Admit: 2017-04-06 | Discharge: 2017-04-09 | DRG: 291 | Disposition: A | Discharge status: Home / Self Care | Payer: Medicare Other | Attending: Internal Medicine | Admitting: Internal Medicine

## 2017-04-06 DIAGNOSIS — Z23 Encounter for immunization: Secondary | ICD-10-CM

## 2017-04-06 DIAGNOSIS — F17210 Nicotine dependence, cigarettes, uncomplicated: Secondary | ICD-10-CM | POA: Diagnosis present

## 2017-04-06 DIAGNOSIS — I509 Heart failure, unspecified: Secondary | ICD-10-CM

## 2017-04-06 DIAGNOSIS — Z90710 Acquired absence of both cervix and uterus: Secondary | ICD-10-CM

## 2017-04-06 DIAGNOSIS — F411 Generalized anxiety disorder: Secondary | ICD-10-CM | POA: Diagnosis present

## 2017-04-06 DIAGNOSIS — K219 Gastro-esophageal reflux disease without esophagitis: Secondary | ICD-10-CM | POA: Diagnosis present

## 2017-04-06 DIAGNOSIS — N183 Chronic kidney disease, stage 3 unspecified: Secondary | ICD-10-CM

## 2017-04-06 DIAGNOSIS — F329 Major depressive disorder, single episode, unspecified: Secondary | ICD-10-CM | POA: Diagnosis present

## 2017-04-06 DIAGNOSIS — J9601 Acute respiratory failure with hypoxia: Secondary | ICD-10-CM | POA: Diagnosis present

## 2017-04-06 DIAGNOSIS — Z170 Estrogen receptor positive status [ER+]: Secondary | ICD-10-CM | POA: Diagnosis not present

## 2017-04-06 DIAGNOSIS — Z9119 Patient's noncompliance with other medical treatment and regimen: Secondary | ICD-10-CM

## 2017-04-06 DIAGNOSIS — Z79811 Long term (current) use of aromatase inhibitors: Secondary | ICD-10-CM

## 2017-04-06 DIAGNOSIS — I480 Paroxysmal atrial fibrillation: Secondary | ICD-10-CM | POA: Diagnosis present

## 2017-04-06 DIAGNOSIS — T501X5A Adverse effect of loop [high-ceiling] diuretics, initial encounter: Secondary | ICD-10-CM | POA: Diagnosis present

## 2017-04-06 DIAGNOSIS — Z7982 Long term (current) use of aspirin: Secondary | ICD-10-CM | POA: Diagnosis not present

## 2017-04-06 DIAGNOSIS — Z923 Personal history of irradiation: Secondary | ICD-10-CM

## 2017-04-06 DIAGNOSIS — F32A Depression, unspecified: Secondary | ICD-10-CM | POA: Diagnosis present

## 2017-04-06 DIAGNOSIS — Z8550 Personal history of malignant neoplasm of unspecified urinary tract organ: Secondary | ICD-10-CM | POA: Diagnosis not present

## 2017-04-06 DIAGNOSIS — I110 Hypertensive heart disease with heart failure: Secondary | ICD-10-CM | POA: Diagnosis not present

## 2017-04-06 DIAGNOSIS — I5030 Unspecified diastolic (congestive) heart failure: Secondary | ICD-10-CM | POA: Diagnosis not present

## 2017-04-06 DIAGNOSIS — I5033 Acute on chronic diastolic (congestive) heart failure: Secondary | ICD-10-CM | POA: Diagnosis present

## 2017-04-06 DIAGNOSIS — I2510 Atherosclerotic heart disease of native coronary artery without angina pectoris: Secondary | ICD-10-CM | POA: Diagnosis present

## 2017-04-06 DIAGNOSIS — J449 Chronic obstructive pulmonary disease, unspecified: Secondary | ICD-10-CM | POA: Diagnosis not present

## 2017-04-06 DIAGNOSIS — Z9889 Other specified postprocedural states: Secondary | ICD-10-CM | POA: Diagnosis not present

## 2017-04-06 DIAGNOSIS — Z79899 Other long term (current) drug therapy: Secondary | ICD-10-CM

## 2017-04-06 DIAGNOSIS — Z801 Family history of malignant neoplasm of trachea, bronchus and lung: Secondary | ICD-10-CM | POA: Diagnosis not present

## 2017-04-06 DIAGNOSIS — Z853 Personal history of malignant neoplasm of breast: Secondary | ICD-10-CM | POA: Diagnosis not present

## 2017-04-06 DIAGNOSIS — F1410 Cocaine abuse, uncomplicated: Secondary | ICD-10-CM | POA: Diagnosis present

## 2017-04-06 DIAGNOSIS — I119 Hypertensive heart disease without heart failure: Secondary | ICD-10-CM | POA: Diagnosis present

## 2017-04-06 DIAGNOSIS — I130 Hypertensive heart and chronic kidney disease with heart failure and stage 1 through stage 4 chronic kidney disease, or unspecified chronic kidney disease: Secondary | ICD-10-CM | POA: Diagnosis present

## 2017-04-06 DIAGNOSIS — N179 Acute kidney failure, unspecified: Secondary | ICD-10-CM | POA: Diagnosis present

## 2017-04-06 DIAGNOSIS — Z8711 Personal history of peptic ulcer disease: Secondary | ICD-10-CM

## 2017-04-06 DIAGNOSIS — R0602 Shortness of breath: Secondary | ICD-10-CM

## 2017-04-06 DIAGNOSIS — Z9114 Patient's other noncompliance with medication regimen: Secondary | ICD-10-CM

## 2017-04-06 DIAGNOSIS — I361 Nonrheumatic tricuspid (valve) insufficiency: Secondary | ICD-10-CM | POA: Diagnosis not present

## 2017-04-06 LAB — COMPREHENSIVE METABOLIC PANEL
ALK PHOS: 100 U/L (ref 38–126)
ALT: 15 U/L (ref 14–54)
AST: 20 U/L (ref 15–41)
Albumin: 2.6 g/dL — ABNORMAL LOW (ref 3.5–5.0)
Anion gap: 10 (ref 5–15)
BUN: 33 mg/dL — AB (ref 6–20)
CO2: 23 mmol/L (ref 22–32)
CREATININE: 1.94 mg/dL — AB (ref 0.44–1.00)
Calcium: 8.6 mg/dL — ABNORMAL LOW (ref 8.9–10.3)
Chloride: 104 mmol/L (ref 101–111)
GFR calc non Af Amer: 26 mL/min — ABNORMAL LOW (ref >60)
GFR, EST AFRICAN AMERICAN: 30 mL/min — AB (ref >60)
GLUCOSE: 159 mg/dL — AB (ref 65–99)
Potassium: 3.7 mmol/L (ref 3.5–5.1)
Sodium: 137 mmol/L (ref 135–145)
Total Bilirubin: 0.3 mg/dL (ref 0.3–1.2)
Total Protein: 7.5 g/dL (ref 6.5–8.1)

## 2017-04-06 LAB — BLOOD GAS, ARTERIAL
Acid-base deficit: 2.9 mmol/L — ABNORMAL HIGH (ref 0.0–2.0)
Bicarbonate: 22.8 mmol/L (ref 20.0–28.0)
DRAWN BY: 441261
O2 Content: 4 L/min
O2 Saturation: 91.3 %
PATIENT TEMPERATURE: 98.6
PH ART: 7.313 — AB (ref 7.350–7.450)
pCO2 arterial: 46.3 mmHg (ref 32.0–48.0)
pO2, Arterial: 72.5 mmHg — ABNORMAL LOW (ref 83.0–108.0)

## 2017-04-06 LAB — CBC WITH DIFFERENTIAL/PLATELET
BASOS ABS: 0 10*3/uL (ref 0.0–0.1)
Basophils Relative: 0 %
EOS ABS: 0.2 10*3/uL (ref 0.0–0.7)
EOS PCT: 2 %
HCT: 38.7 % (ref 36.0–46.0)
HEMOGLOBIN: 12.1 g/dL (ref 12.0–15.0)
LYMPHS PCT: 17 %
Lymphs Abs: 1.7 10*3/uL (ref 0.7–4.0)
MCH: 28.3 pg (ref 26.0–34.0)
MCHC: 31.3 g/dL (ref 30.0–36.0)
MCV: 90.4 fL (ref 78.0–100.0)
Monocytes Absolute: 0.4 10*3/uL (ref 0.1–1.0)
Monocytes Relative: 4 %
NEUTROS PCT: 77 %
Neutro Abs: 8 10*3/uL — ABNORMAL HIGH (ref 1.7–7.7)
PLATELETS: 423 10*3/uL — AB (ref 150–400)
RBC: 4.28 MIL/uL (ref 3.87–5.11)
RDW: 16 % — ABNORMAL HIGH (ref 11.5–15.5)
WBC: 10.3 10*3/uL (ref 4.0–10.5)

## 2017-04-06 LAB — I-STAT TROPONIN, ED: Troponin i, poc: 0.04 ng/mL (ref 0.00–0.08)

## 2017-04-06 LAB — ETHANOL

## 2017-04-06 LAB — BRAIN NATRIURETIC PEPTIDE: B Natriuretic Peptide: 1406.7 pg/mL — ABNORMAL HIGH (ref 0.0–100.0)

## 2017-04-06 MED ORDER — BUSPIRONE HCL 5 MG PO TABS
10.0000 mg | ORAL_TABLET | Freq: Two times a day (BID) | ORAL | Status: DC
  Administered 2017-04-06 – 2017-04-09 (×6): 10 mg via ORAL
  Filled 2017-04-06 (×6): qty 2

## 2017-04-06 MED ORDER — TRAZODONE HCL 50 MG PO TABS
25.0000 mg | ORAL_TABLET | Freq: Every evening | ORAL | Status: DC | PRN
  Administered 2017-04-07 – 2017-04-08 (×2): 25 mg via ORAL
  Filled 2017-04-06 (×2): qty 1

## 2017-04-06 MED ORDER — ALBUTEROL SULFATE (2.5 MG/3ML) 0.083% IN NEBU
2.5000 mg | INHALATION_SOLUTION | Freq: Four times a day (QID) | RESPIRATORY_TRACT | Status: DC | PRN
  Administered 2017-04-07: 2.5 mg via RESPIRATORY_TRACT
  Filled 2017-04-06: qty 3

## 2017-04-06 MED ORDER — ALBUTEROL SULFATE (2.5 MG/3ML) 0.083% IN NEBU: 5.0000 mg | INHALATION_SOLUTION | Freq: Once | RESPIRATORY_TRACT | Status: DC

## 2017-04-06 MED ORDER — ENOXAPARIN SODIUM 30 MG/0.3ML ~~LOC~~ SOLN
30.0000 mg | SUBCUTANEOUS | Status: DC
  Administered 2017-04-06 – 2017-04-08 (×3): 30 mg via SUBCUTANEOUS
  Filled 2017-04-06 (×3): qty 0.3

## 2017-04-06 MED ORDER — FUROSEMIDE 10 MG/ML IJ SOLN
60.0000 mg | Freq: Once | INTRAMUSCULAR | Status: AC
  Administered 2017-04-06: 60 mg via INTRAVENOUS
  Filled 2017-04-06: qty 8

## 2017-04-06 MED ORDER — ACETAMINOPHEN 325 MG PO TABS: 650.0000 mg | ORAL_TABLET | ORAL | Status: DC | PRN

## 2017-04-06 MED ORDER — AMLODIPINE BESYLATE 10 MG PO TABS
10.0000 mg | ORAL_TABLET | Freq: Every day | ORAL | Status: DC
  Administered 2017-04-07 – 2017-04-09 (×3): 10 mg via ORAL
  Filled 2017-04-06 (×3): qty 1

## 2017-04-06 MED ORDER — GABAPENTIN 300 MG PO CAPS
300.0000 mg | ORAL_CAPSULE | Freq: Three times a day (TID) | ORAL | Status: DC
  Administered 2017-04-06 – 2017-04-07 (×2): 300 mg via ORAL
  Filled 2017-04-06 (×2): qty 1

## 2017-04-06 MED ORDER — TIOTROPIUM BROMIDE MONOHYDRATE 18 MCG IN CAPS
18.0000 ug | ORAL_CAPSULE | Freq: Every day | RESPIRATORY_TRACT | Status: DC
  Administered 2017-04-06 – 2017-04-08 (×3): 18 ug via RESPIRATORY_TRACT
  Filled 2017-04-06: qty 5

## 2017-04-06 MED ORDER — ANASTROZOLE 1 MG PO TABS
1.0000 mg | ORAL_TABLET | Freq: Every day | ORAL | Status: DC
  Administered 2017-04-06 – 2017-04-09 (×4): 1 mg via ORAL
  Filled 2017-04-06 (×4): qty 1

## 2017-04-06 MED ORDER — INFLUENZA VAC SPLIT HIGH-DOSE 0.5 ML IM SUSY
0.5000 mL | PREFILLED_SYRINGE | INTRAMUSCULAR | Status: AC
  Administered 2017-04-07: 0.5 mL via INTRAMUSCULAR
  Filled 2017-04-06: qty 0.5

## 2017-04-06 MED ORDER — SODIUM CHLORIDE 0.9% FLUSH: 3.0000 mL | INTRAVENOUS | Status: DC | PRN

## 2017-04-06 MED ORDER — ONDANSETRON HCL 4 MG/2ML IJ SOLN: 4.0000 mg | Freq: Four times a day (QID) | INTRAMUSCULAR | Status: DC | PRN

## 2017-04-06 MED ORDER — SODIUM CHLORIDE 0.9 % IV SOLN: 250.0000 mL | INTRAVENOUS | Status: DC | PRN

## 2017-04-06 MED ORDER — FUROSEMIDE 10 MG/ML IJ SOLN
40.0000 mg | Freq: Every day | INTRAMUSCULAR | Status: DC
  Administered 2017-04-07 – 2017-04-09 (×3): 40 mg via INTRAVENOUS
  Filled 2017-04-06 (×3): qty 4

## 2017-04-06 MED ORDER — HYDRALAZINE HCL 50 MG PO TABS
100.0000 mg | ORAL_TABLET | Freq: Three times a day (TID) | ORAL | Status: DC
  Administered 2017-04-06 – 2017-04-09 (×9): 100 mg via ORAL
  Filled 2017-04-06 (×9): qty 2

## 2017-04-06 MED ORDER — IPRATROPIUM-ALBUTEROL 0.5-2.5 (3) MG/3ML IN SOLN
3.0000 mL | Freq: Once | RESPIRATORY_TRACT | Status: AC
  Administered 2017-04-06: 3 mL via RESPIRATORY_TRACT
  Filled 2017-04-06: qty 3

## 2017-04-06 MED ORDER — SODIUM CHLORIDE 0.9% FLUSH
3.0000 mL | Freq: Two times a day (BID) | INTRAVENOUS | Status: DC
  Administered 2017-04-06 – 2017-04-09 (×5): 3 mL via INTRAVENOUS

## 2017-04-06 MED ORDER — ASPIRIN EC 81 MG PO TBEC
81.0000 mg | DELAYED_RELEASE_TABLET | Freq: Every day | ORAL | Status: DC
Start: 2017-04-06 — End: 2017-04-09
  Administered 2017-04-06 – 2017-04-09 (×4): 81 mg via ORAL
  Filled 2017-04-06 (×4): qty 1

## 2017-04-06 MED ORDER — COLCHICINE 0.6 MG PO TABS
0.6000 mg | ORAL_TABLET | Freq: Every day | ORAL | Status: DC
  Administered 2017-04-07: 0.6 mg via ORAL
  Filled 2017-04-06: qty 1

## 2017-04-06 MED ORDER — AMIODARONE HCL 200 MG PO TABS
200.0000 mg | ORAL_TABLET | Freq: Two times a day (BID) | ORAL | Status: DC
  Administered 2017-04-06 – 2017-04-09 (×6): 200 mg via ORAL
  Filled 2017-04-06 (×6): qty 1

## 2017-04-06 MED ORDER — POTASSIUM CHLORIDE CRYS ER 20 MEQ PO TBCR
20.0000 meq | EXTENDED_RELEASE_TABLET | Freq: Every day | ORAL | Status: DC
  Administered 2017-04-06 – 2017-04-09 (×4): 20 meq via ORAL
  Filled 2017-04-06 (×4): qty 1

## 2017-04-06 NOTE — Progress Notes (Signed)
Patient arrived to room. Condition stable, O2 increased to 5L (oxygen sat 92% on 5L). Heart monitor and standing weight obtained.  Barbee Shropshire. Brigitte Pulse, RN
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
## 2017-04-06 NOTE — ED Notes (Signed)
Bed: RESA Expected date:  Expected time:  Means of arrival:  Comments: Resp distress

## 2017-04-06 NOTE — ED Notes (Signed)
Attempted to stick pt for blood. Pt refused to be stuck again. Requested that [place] place IV
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
## 2017-04-06 NOTE — ED Notes (Signed)
Attempted to call report to 4th floor but RN did not answer phone. Will attempt later

## 2017-04-06 NOTE — ED Notes (Signed)
Call report to [place] at [phone] at [phone]

## 2017-04-06 NOTE — ED Triage Notes (Signed)
EMS  Has long history of COPD, frequently gets Breathing treatments by EMS and stays home. Today sats 68% resp 40. Duo neb given total 10 albuterol and .5 atrovent plus 2 grams Mag and 125mg  Solumedrol. IV #20 LAC

## 2017-04-07 ENCOUNTER — Inpatient Hospital Stay (HOSPITAL_COMMUNITY): Payer: Medicare Other

## 2017-04-07 DIAGNOSIS — I2510 Atherosclerotic heart disease of native coronary artery without angina pectoris: Secondary | ICD-10-CM

## 2017-04-07 DIAGNOSIS — I5033 Acute on chronic diastolic (congestive) heart failure: Secondary | ICD-10-CM

## 2017-04-07 DIAGNOSIS — N183 Chronic kidney disease, stage 3 (moderate): Secondary | ICD-10-CM

## 2017-04-07 DIAGNOSIS — I361 Nonrheumatic tricuspid (valve) insufficiency: Secondary | ICD-10-CM

## 2017-04-07 DIAGNOSIS — F1410 Cocaine abuse, uncomplicated: Secondary | ICD-10-CM

## 2017-04-07 DIAGNOSIS — N179 Acute kidney failure, unspecified: Secondary | ICD-10-CM

## 2017-04-07 LAB — BASIC METABOLIC PANEL
ANION GAP: 9 (ref 5–15)
BUN: 42 mg/dL — ABNORMAL HIGH (ref 6–20)
CHLORIDE: 104 mmol/L (ref 101–111)
CO2: 26 mmol/L (ref 22–32)
Calcium: 9.1 mg/dL (ref 8.9–10.3)
Creatinine, Ser: 2.41 mg/dL — ABNORMAL HIGH (ref 0.44–1.00)
GFR calc non Af Amer: 20 mL/min — ABNORMAL LOW (ref >60)
GFR, EST AFRICAN AMERICAN: 23 mL/min — AB (ref >60)
Glucose, Bld: 166 mg/dL — ABNORMAL HIGH (ref 65–99)
POTASSIUM: 4.1 mmol/L (ref 3.5–5.1)
Sodium: 139 mmol/L (ref 135–145)

## 2017-04-07 LAB — RAPID URINE DRUG SCREEN, HOSP PERFORMED
Amphetamines: NOT DETECTED
BARBITURATES: NOT DETECTED
Benzodiazepines: NOT DETECTED
COCAINE: POSITIVE — AB
Opiates: NOT DETECTED
TETRAHYDROCANNABINOL: NOT DETECTED

## 2017-04-07 LAB — ECHOCARDIOGRAM COMPLETE
Height: 61 in
Weight: 1979.2 oz

## 2017-04-07 MED ORDER — COLCHICINE 0.6 MG PO TABS
0.3000 mg | ORAL_TABLET | Freq: Every day | ORAL | Status: DC
  Administered 2017-04-08 – 2017-04-09 (×2): 0.3 mg via ORAL
  Filled 2017-04-07 (×2): qty 1

## 2017-04-07 MED ORDER — ORAL CARE MOUTH RINSE
15.0000 mL | Freq: Two times a day (BID) | OROMUCOSAL | Status: DC
  Administered 2017-04-08 – 2017-04-09 (×3): 15 mL via OROMUCOSAL

## 2017-04-07 MED ORDER — GABAPENTIN 100 MG PO CAPS
200.0000 mg | ORAL_CAPSULE | Freq: Three times a day (TID) | ORAL | Status: DC
  Administered 2017-04-07 – 2017-04-09 (×7): 200 mg via ORAL
  Filled 2017-04-07 (×7): qty 2

## 2017-04-07 NOTE — Progress Notes (Signed)
  Echocardiogram 2D Echocardiogram has been performed.  [place] Burke L Androw 04/07/2017, 9:31 AM
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
## 2017-04-07 NOTE — Progress Notes (Signed)
Patient ID: Victoria Burke, female   DOB: 1950-10-13, 66 y.o.   MRN: [id number]  PROGRESS NOTE    LACOSTA HARGAN  NWG:[id number] DOB: 07/28/1950 DOA: 04/06/2017  PCP: Dorena Dew, FNP   Brief Narrative:   66 y.o. female with medical history significant for chronic diastolic CHF with preserved EF based on recent ECHO in 02/2017, a fib on aspirin (not on AC due to non-compliance), alcohol and cocaine abuse, CKD stage 3 who presented to ED with worsening shortness of breath at rest and with exertion for past few days prior to the admission associated with fatigue. No cough, no fever or chest pain. No palpitations. No abdominal pain, nausea or vomiting. No lightheadedness or loss of consciousness. Recent admission from 03/23/2017 - 03/30/2017 - for atrial fibrillation (not started on coumadin due to non-compliance and ongoing alcohol and cocaine abuse.  Oxygen saturation was in 60's on arrival to ED. CXR showed worsening changes of congestive heart failure with interval alveolar edema superimposed on interstitial edema. BNP was in 1400 range. She was given lasix 60 mg IV dose once. She was also given nebulizer treatments but continued to be short of breath. Cardio consulted.  Assessment & Plan:    Principal Problem:   Acute on chronic diastolic CHF (congestive heart failure) ([place]) / Acute respiratory failure with hypoxia  - BNP on admission 1,406 - CXR on admission showed worsening changes of congestive heart failure with interval alveolar edema superimposed on interstitial edema - Cardiology consulted - ECHO 02/2017 showed EF 55%, grade 2 DD - Given lasix IV in ED  - Continue lasix 40 mg IV daily per cardio recriminations - Strict intake and output and daily weight  - Replete electrolytes    Active Problems:   History of breast cancer - Continue Arimidex     Depression / Generalized anxiety disorder - Continue Buspar and gabapentin     Cocaine abuse - UDS positive for  cocaine     Chronic obstructive pulmonary disease ([place]) / Tobacco abuse - Counseled on cessation - Stable resp status       CAD (coronary artery disease), native coronary artery - Continue aspirin daily     Kidney disease, chronic, stage III (GFR 30-59 ml/min) - Cr 2.39 on 9.18/2018 - Cr trending up, likely due to lasix  - Monitor BMP daily since pt on lasix IV    Hypertensive heart disease - Continue Norvasc and hydralazine     Paroxysmal A fib - CHA2DS2-VASc Score3 - Not on Anticoagulation due to non-compliance and alcohol and cocaine  - Continue amiodarone and Cardizem   DVT prophylaxis: Lovenox subQ Code Status: full code  Family Communication: no family at the bedside  Disposition Plan: home once cleared by cardio    Consultants:   Cardiology   Procedures:   None  Antimicrobials:   None    Subjective: No overnight events.   Objective: Vitals:   04/07/17 0120 04/07/17 0600 04/07/17 0805 04/07/17 0945  BP: (!) 162/72 (!) 177/87  (!) 156/80  Pulse:  84  79  Resp:  20    Temp:  98.3 F (36.8 C)    TempSrc:  Oral    SpO2:  96% 95%   Weight:      Height:        Intake/Output Summary (Last 24 hours) at 04/07/17 1237 Last data filed at 04/07/17 0559  Gross per 24 hour  Intake  0 ml  Output              400 ml  Net             -400 ml   Filed Weights   04/06/17 1017 04/06/17 1827  Weight: 52.2 kg (115 lb) 56.1 kg (123 lb 11.2 oz)    Examination:  General exam: Appears calm and comfortable  Respiratory system: Bibasilar rales, no wheezing  Cardiovascular system: S1 & S2 heard, RRR.  Gastrointestinal system: Abdomen is nondistended, soft and nontender. No organomegaly or masses felt. Normal bowel sounds heard. Central nervous system: Alert and oriented. No focal neurological deficits. Extremities: Symmetric 5 x 5 power. Skin: No rashes, lesions or ulcers Psychiatry: Judgement and insight appear normal. Mood & affect  appropriate.   Data Reviewed: I have personally reviewed following labs and imaging studies  CBC:  Recent Labs Lab 04/06/17 1053  WBC 10.3  NEUTROABS 8.0*  HGB 12.1  HCT 38.7  MCV 90.4  PLT 671*   Basic Metabolic Panel:  Recent Labs Lab 04/06/17 1053 04/07/17 0525  NA 137 139  K 3.7 4.1  CL 104 104  CO2 23 26  GLUCOSE 159* 166*  BUN 33* 42*  CREATININE 1.94* 2.41*  CALCIUM 8.6* 9.1   GFR: Estimated Creatinine Clearance: 17.6 mL/min (A) (by C-G formula based on SCr of 2.41 mg/dL (H)). Liver Function Tests:  Recent Labs Lab 04/06/17 1053  AST 20  ALT 15  ALKPHOS 100  BILITOT 0.3  PROT 7.5  ALBUMIN 2.6*   No results for input(s): LIPASE, AMYLASE in the last 168 hours. No results for input(s): AMMONIA in the last 168 hours. Coagulation Profile: No results for input(s): INR, PROTIME in the last 168 hours. Cardiac Enzymes: No results for input(s): CKTOTAL, CKMB, CKMBINDEX, TROPONINI in the last 168 hours. BNP (last 3 results) No results for input(s): PROBNP in the last 8760 hours. HbA1C: No results for input(s): HGBA1C in the last 72 hours. CBG: No results for input(s): GLUCAP in the last 168 hours. Lipid Profile: No results for input(s): CHOL, HDL, LDLCALC, TRIG, CHOLHDL, LDLDIRECT in the last 72 hours. Thyroid Function Tests: No results for input(s): TSH, T4TOTAL, FREET4, T3FREE, THYROIDAB in the last 72 hours. Anemia Panel: No results for input(s): VITAMINB12, FOLATE, FERRITIN, TIBC, IRON, RETICCTPCT in the last 72 hours. Urine analysis:    Component Value Date/Time   COLORURINE YELLOW 03/23/2017 2057   APPEARANCEUR HAZY (A) 03/23/2017 2057   LABSPEC 1.013 03/23/2017 2057   PHURINE 5.0 03/23/2017 2057   GLUCOSEU NEGATIVE 03/23/2017 2057   HGBUR NEGATIVE 03/23/2017 2057   BILIRUBINUR NEGATIVE 03/23/2017 2057   KETONESUR NEGATIVE 03/23/2017 2057   PROTEINUR >=300 (A) 03/23/2017 2057   UROBILINOGEN 0.2 10/31/2016 1050   NITRITE NEGATIVE 03/23/2017  2057   LEUKOCYTESUR NEGATIVE 03/23/2017 2057   Sepsis Labs: @LABRCNTIP (procalcitonin:4,lacticidven:4)   )No results found for this or any previous visit (from the past 240 hour(s)).    Radiology Studies: Dg Chest 2 View  Result Date: 04/06/2017 CLINICAL DATA:  Shortness breath on exertion.  Long history of COPD. EXAM: CHEST  2 VIEW COMPARISON:  03/26/2017. FINDINGS: Stable enlargement of the cardiac silhouette. Diffuse increase in prominence of the interstitial markings with mild diffuse bilateral alveolar opacities. No significant change in a moderate-sized right pleural effusion. Mild increase in size of a smaller left pleural effusion. Atheromatous aortic calcifications. Left axillary surgical clips. Mild thoracic spine degenerative changes. IMPRESSION: 1. Worsening changes of congestive heart failure with  interval alveolar edema superimposed on interstitial edema. 2. Mild increase in amount of left pleural fluid and stable right pleural fluid. 3. Stable cardiomegaly. Electronically Signed   By: Claudie Revering M.D.   On: 04/06/2017 11:13     Scheduled Meds: . amiodarone  200 mg Oral BID  . amLODipine  10 mg Oral Daily  . anastrozole  1 mg Oral Daily  . aspirin EC  81 mg Oral Daily  . busPIRone  10 mg Oral BID  . [START ON 04/08/2017] colchicine  0.3 mg Oral Daily  . enoxaparin (LOVENOX) injection  30 mg Subcutaneous Q24H  . furosemide  40 mg Intravenous Daily  . gabapentin  200 mg Oral TID  . hydrALAZINE  100 mg Oral Q8H  . Influenza vac split quadrivalent PF  0.5 mL Intramuscular Tomorrow-1000  . [START ON 04/08/2017] mouth rinse  15 mL Mouth Rinse BID  . potassium chloride  20 mEq Oral Daily  . sodium chloride flush  3 mL Intravenous Q12H  . tiotropium  18 mcg Inhalation Daily   Continuous Infusions: . sodium chloride       LOS: 1 day    Time spent: 25 minutes  Greater than 50% of the time spent on counseling and coordinating the care.   Leisa Lenz, MD [facility] Pager [phone]  If 7PM-7AM, please contact night-coverage [email or web address] Password [email or web address] 04/07/2017, 12:37 PM

## 2017-04-08 LAB — BASIC METABOLIC PANEL
ANION GAP: 9 (ref 5–15)
BUN: 47 mg/dL — ABNORMAL HIGH (ref 6–20)
CO2: 26 mmol/L (ref 22–32)
Calcium: 9 mg/dL (ref 8.9–10.3)
Chloride: 103 mmol/L (ref 101–111)
Creatinine, Ser: 1.98 mg/dL — ABNORMAL HIGH (ref 0.44–1.00)
GFR calc Af Amer: 29 mL/min — ABNORMAL LOW (ref >60)
GFR, EST NON AFRICAN AMERICAN: 25 mL/min — AB (ref >60)
Glucose, Bld: 91 mg/dL (ref 65–99)
POTASSIUM: 4.3 mmol/L (ref 3.5–5.1)
SODIUM: 138 mmol/L (ref 135–145)

## 2017-04-08 NOTE — Progress Notes (Signed)
Patient ID: Victoria Burke, female   DOB: October 22, 1950, 66 y.o.   MRN: [id number]  PROGRESS NOTE    DACY ENRICO  TKW:[id number] DOB: 11-Feb-1951 DOA: 04/06/2017  PCP: Dorena Dew, FNP   Brief Narrative:   66 y.o. female with medical history significant for chronic diastolic CHF with preserved EF based on recent ECHO in 02/2017, a fib on aspirin (not on AC due to non-compliance), alcohol and cocaine abuse, CKD stage 3 who presented to ED with worsening shortness of breath at rest and with exertion for past few days prior to the admission associated with fatigue. No cough, no fever or chest pain. No palpitations. No abdominal pain, nausea or vomiting. No lightheadedness or loss of consciousness. Recent admission from 03/23/2017 - 03/30/2017 - for atrial fibrillation (not started on coumadin due to non-compliance and ongoing alcohol and cocaine abuse.  Oxygen saturation was in 60's on arrival to ED. CXR showed worsening changes of congestive heart failure with interval alveolar edema superimposed on interstitial edema. BNP was in 1400 range. She was given lasix 60 mg IV dose once. She was also given nebulizer treatments but continued to be short of breath. Cardio consulted.  Assessment & Plan:    Principal Problem:   Acute on chronic diastolic CHF (congestive heart failure) ([place]) / Acute respiratory failure with hypoxia  - BNP on admission 1,406 - CXR on admission showed worsening changes of congestive heart failure with interval alveolar edema superimposed on interstitial edema - ECHO 02/2017 showed EF 55%, grade 2 DD - Continue lasix 40 mg IV daily - Continue strict intake and output - Continue daily weight  - Replete electrolytes  - Avoiding RAAS inhibitors and beta blockers due to renal insufficiency and cocaine abuse respectively - Can add ARB when renal function returns to baseline   Active Problems:   History of breast cancer - Continue Arimidex     Depression / Generalized  anxiety disorder - Continue Buspar and gabapentin     Cocaine abuse - UDS positive for cocaine  - Counseled on drug abuse cessation     Chronic obstructive pulmonary disease ([facility]) / Tobacco abuse - Counseled on cessation - Stable       CAD (coronary artery disease), native coronary artery - Continue daily aspirin     Kidney disease, chronic, stage III (GFR 30-59 ml/min) - Cr 2.39 on 9.18/2018 - Cr initially trended up to 2.41 but now down to 1.98 this am - Continue to monitor daily BMP while pt on IV lasix     Hypertensive heart disease - Continue Norvasc and Hydralazine     Paroxysmal A fib - CHA2DS2-VASc Score5 - Not on Anticoagulation due to non-compliance and alcohol and cocaine  - Continue amiodarone and Cardizem   DVT prophylaxis: Lovenox subQ Code Status: full code  Family Communication: no family at the bedside   Disposition Plan: home once cleared by cardio    Consultants:   Cardiology   Procedures:   None   Antimicrobials:   None     Subjective: No overnight events.  Objective: Vitals:   04/08/17 0500 04/08/17 0525 04/08/17 0759 04/08/17 1436  BP:  (!) 171/88  (!) 157/73  Pulse:  76  77  Resp:  18  18  Temp:  98.7 F (37.1 C)  98.8 F (37.1 C)  TempSrc:  Oral  Oral  SpO2:  94% 97% 95%  Weight: 55.7 kg (122 lb 12.7 oz)     Height:  Intake/Output Summary (Last 24 hours) at 04/08/17 1802 Last data filed at 04/08/17 1437  Gross per 24 hour  Intake              480 ml  Output                0 ml  Net              480 ml   Filed Weights   04/06/17 1017 04/06/17 1827 04/08/17 0500  Weight: 52.2 kg (115 lb) 56.1 kg (123 lb 11.2 oz) 55.7 kg (122 lb 12.7 oz)    Physical Exam  Constitutional: Appears malnourish, no distress  CVS: RRR, S1/S2 + Pulmonary: Effort and breath sounds normal, no stridor, rhonchi, wheezes, rales.  Abdominal: Soft. BS +,  no distension, tenderness, rebound or guarding.  Musculoskeletal: Normal range of  motion. No edema and no tenderness.  Lymphadenopathy: No lymphadenopathy noted, cervical, inguinal. Neuro: Alert. Normal reflexes, muscle tone coordination. No cranial nerve deficit. Skin: Skin is warm and dry.  Psychiatric: Normal mood and affect. Behavior, judgment, thought content normal.     Data Reviewed: I have personally reviewed following labs and imaging studies  CBC:  Recent Labs Lab 04/06/17 1053  WBC 10.3  NEUTROABS 8.0*  HGB 12.1  HCT 38.7  MCV 90.4  PLT 174*   Basic Metabolic Panel:  Recent Labs Lab 04/06/17 1053 04/07/17 0525 04/08/17 0508  NA 137 139 138  K 3.7 4.1 4.3  CL 104 104 103  CO2 23 26 26   GLUCOSE 159* 166* 91  BUN 33* 42* 47*  CREATININE 1.94* 2.41* 1.98*  CALCIUM 8.6* 9.1 9.0   GFR: Estimated Creatinine Clearance: 21.4 mL/min (A) (by C-G formula based on SCr of 1.98 mg/dL (H)). Liver Function Tests:  Recent Labs Lab 04/06/17 1053  AST 20  ALT 15  ALKPHOS 100  BILITOT 0.3  PROT 7.5  ALBUMIN 2.6*   No results for input(s): LIPASE, AMYLASE in the last 168 hours. No results for input(s): AMMONIA in the last 168 hours. Coagulation Profile: No results for input(s): INR, PROTIME in the last 168 hours. Cardiac Enzymes: No results for input(s): CKTOTAL, CKMB, CKMBINDEX, TROPONINI in the last 168 hours. BNP (last 3 results) No results for input(s): PROBNP in the last 8760 hours. HbA1C: No results for input(s): HGBA1C in the last 72 hours. CBG: No results for input(s): GLUCAP in the last 168 hours. Lipid Profile: No results for input(s): CHOL, HDL, LDLCALC, TRIG, CHOLHDL, LDLDIRECT in the last 72 hours. Thyroid Function Tests: No results for input(s): TSH, T4TOTAL, FREET4, T3FREE, THYROIDAB in the last 72 hours. Anemia Panel: No results for input(s): VITAMINB12, FOLATE, FERRITIN, TIBC, IRON, RETICCTPCT in the last 72 hours. Urine analysis:    Component Value Date/Time   COLORURINE YELLOW 03/23/2017 2057   APPEARANCEUR HAZY (A)  03/23/2017 2057   LABSPEC 1.013 03/23/2017 2057   PHURINE 5.0 03/23/2017 2057   GLUCOSEU NEGATIVE 03/23/2017 2057   HGBUR NEGATIVE 03/23/2017 2057   BILIRUBINUR NEGATIVE 03/23/2017 2057   KETONESUR NEGATIVE 03/23/2017 2057   PROTEINUR >=300 (A) 03/23/2017 2057   UROBILINOGEN 0.2 10/31/2016 1050   NITRITE NEGATIVE 03/23/2017 2057   LEUKOCYTESUR NEGATIVE 03/23/2017 2057   Sepsis Labs: @LABRCNTIP (procalcitonin:4,lacticidven:4)   )No results found for this or any previous visit (from the past 240 hour(s)).    Radiology Studies: Dg Chest 2 View  Result Date: 04/06/2017 CLINICAL DATA:  Shortness breath on exertion.  Long history of COPD. EXAM: CHEST  2 VIEW COMPARISON:  03/26/2017. FINDINGS: Stable enlargement of the cardiac silhouette. Diffuse increase in prominence of the interstitial markings with mild diffuse bilateral alveolar opacities. No significant change in a moderate-sized right pleural effusion. Mild increase in size of a smaller left pleural effusion. Atheromatous aortic calcifications. Left axillary surgical clips. Mild thoracic spine degenerative changes. IMPRESSION: 1. Worsening changes of congestive heart failure with interval alveolar edema superimposed on interstitial edema. 2. Mild increase in amount of left pleural fluid and stable right pleural fluid. 3. Stable cardiomegaly. Electronically Signed   By: Claudie Revering M.D.   On: 04/06/2017 11:13     Scheduled Meds: . amiodarone  200 mg Oral BID  . amLODipine  10 mg Oral Daily  . anastrozole  1 mg Oral Daily  . aspirin EC  81 mg Oral Daily  . busPIRone  10 mg Oral BID  . colchicine  0.3 mg Oral Daily  . enoxaparin (LOVENOX) injection  30 mg Subcutaneous Q24H  . furosemide  40 mg Intravenous Daily  . gabapentin  200 mg Oral TID  . hydrALAZINE  100 mg Oral Q8H  . mouth rinse  15 mL Mouth Rinse BID  . potassium chloride  20 mEq Oral Daily  . sodium chloride flush  3 mL Intravenous Q12H  . tiotropium  18 mcg Inhalation  Daily   Continuous Infusions: . sodium chloride       LOS: 2 days    Time spent: 25 minutes  Greater than 50% of the time spent on counseling and coordinating the care.   Leisa Lenz, MD [facility] Pager [phone]  If 7PM-7AM, please contact night-coverage [email or web address] Password [email or web address] 04/08/2017, 6:02 PM

## 2017-04-08 NOTE — Progress Notes (Signed)
Progress Note  Patient Name: Victoria Burke Date of Encounter: 04/08/2017  Primary Cardiologist: Dr. Marlou Porch  Subjective   Denies any chest pain or palpitations. Breathing improving but not at baseline.   Inpatient Medications    Scheduled Meds: . amiodarone  200 mg Oral BID  . amLODipine  10 mg Oral Daily  . anastrozole  1 mg Oral Daily  . aspirin EC  81 mg Oral Daily  . busPIRone  10 mg Oral BID  . colchicine  0.3 mg Oral Daily  . enoxaparin (LOVENOX) injection  30 mg Subcutaneous Q24H  . furosemide  40 mg Intravenous Daily  . gabapentin  200 mg Oral TID  . hydrALAZINE  100 mg Oral Q8H  . mouth rinse  15 mL Mouth Rinse BID  . potassium chloride  20 mEq Oral Daily  . sodium chloride flush  3 mL Intravenous Q12H  . tiotropium  18 mcg Inhalation Daily   Continuous Infusions: . sodium chloride     PRN Meds: sodium chloride, acetaminophen, albuterol, ondansetron (ZOFRAN) IV, sodium chloride flush, traZODone   Vital Signs    Vitals:   04/07/17 2025 04/08/17 0500 04/08/17 0525 04/08/17 0759  BP: (!) 152/69  (!) 171/88   Pulse: 78  76   Resp: 18  18   Temp: 98.1 F (36.7 C)  98.7 F (37.1 C)   TempSrc: Oral  Oral   SpO2: 95%  94% 97%  Weight:  122 lb 12.7 oz (55.7 kg)    Height:        Intake/Output Summary (Last 24 hours) at 04/08/17 1110 Last data filed at 04/07/17 1428  Gross per 24 hour  Intake              360 ml  Output              401 ml  Net              -41 ml   Filed Weights   04/06/17 1017 04/06/17 1827 04/08/17 0500  Weight: 115 lb (52.2 kg) 123 lb 11.2 oz (56.1 kg) 122 lb 12.7 oz (55.7 kg)    Telemetry    NSR, HR 70's - 80's. Occasional episodes of ventricular trigeminy with frequent PVC's.  - Personally Reviewed  ECG    No new tracings.   Physical Exam   General: Well developed, African American female appearing in no acute distress. Head: Normocephalic, atraumatic.  Neck: Supple without bruits, JVD at 9cm. Lungs:  Resp regular  and unlabored, rales along left base. Heart: RRR, S1, S2, no S3, S4, or murmur; no rub. Abdomen: Soft, non-tender, non-distended with normoactive bowel sounds. No hepatomegaly. No rebound/guarding. No obvious abdominal masses. Extremities: No clubbing, cyanosis, or lower extremity edema. Distal pedal pulses are 2+ bilaterally. Neuro: Alert and oriented X 3. Moves all extremities spontaneously. Psych: Normal affect.  Labs    Chemistry Recent Labs Lab 04/06/17 1053 04/07/17 0525 04/08/17 0508  NA 137 139 138  K 3.7 4.1 4.3  CL 104 104 103  CO2 23 26 26   GLUCOSE 159* 166* 91  BUN 33* 42* 47*  CREATININE 1.94* 2.41* 1.98*  CALCIUM 8.6* 9.1 9.0  PROT 7.5  --   --   ALBUMIN 2.6*  --   --   AST 20  --   --   ALT 15  --   --   ALKPHOS 100  --   --   BILITOT 0.3  --   --  GFRNONAA 26* 20* 25*  GFRAA 30* 23* 29*  ANIONGAP 10 9 9      Hematology Recent Labs Lab 04/06/17 1053  WBC 10.3  RBC 4.28  HGB 12.1  HCT 38.7  MCV 90.4  MCH 28.3  MCHC 31.3  RDW 16.0*  PLT 423*    Cardiac EnzymesNo results for input(s): TROPONINI in the last 168 hours.  Recent Labs Lab 04/06/17 1104  TROPIPOC 0.04     BNP Recent Labs Lab 04/06/17 1053  BNP 1,406.7*     DDimer No results for input(s): DDIMER in the last 168 hours.   Radiology    No results found.  Cardiac Studies   Echocardiogram: 04/07/2017 Study Conclusions  - Left ventricle: The cavity size was normal. There was moderate   concentric hypertrophy. Systolic function was vigorous. The   estimated ejection fraction was in the range of 65% to 70%. Wall   motion was normal; there were no regional wall motion   abnormalities. Features are consistent with a pseudonormal left   ventricular filling pattern, with concomitant abnormal relaxation   and increased filling pressure (grade 2 diastolic dysfunction).   Doppler parameters are consistent with elevated ventricular   end-diastolic filling pressure. - Aortic  valve: Possibly bicuspid; moderately thickened, severely   calcified leaflets. There was mild stenosis. Valve area (VTI):   2.82 cm^2. Valve area (Vmax): 2.75 cm^2. Valve area (Vmean): 3.32   cm^2. - Ascending aorta: The ascending aorta was not visualized. - Mitral valve: There was mild regurgitation. - Left atrium: The atrium was mildly dilated. - Right ventricle: The cavity size was moderately dilated. Wall   thickness was normal. Systolic function was moderately reduced. - Right atrium: The atrium was severely dilated. - Tricuspid valve: There was severe regurgitation. - Pulmonary arteries: Systolic pressure was severely increased. PA   peak pressure: 76 mm Hg (S). - Pericardium, extracardiac: A trivial pericardial effusion was   identified posterior to the heart. Features were not consistent   with tamponade physiology.  Patient Profile     66 y.o. female with PMH of breast CA (treated with radiation), COPD, PAF (not on [place] given history of medical noncompliance), polysubstance abuse (h/o cocaine, tobacco and ETOH), CAD, chronic diastolic HF, GERD, h/o gastric ulcer, HTN and HLD who presented to [facility] ED on 04/06/2017 for worsening dyspnea. Cardiology consulted for assistance with management of CHF.  Assessment & Plan    1. Acute on Chronic Diastolic HF - presented with worsening dyspnea and edema with CXR and BNP (elevated at 1406) c/w with acute CHF exacerbation.  - repeat echocardiogram shows an EF of 65-70% with Grade 2 DD, mild AS, mild MR, severe TR, and severely elevated PA pressures.  - initially received IV Lasix at 60mg  but creatinine peaked at 2.41. Has since been switched to  IV Lasix 40mg  daily with a minimal recorded net output of -581 mL with weight declining by 1 pound. She does report good diuresis and says breathing is improving. Would continue with Lasix at current dosing. Continue to monitor I&O's along with daily weights. Reassess output and kidney function in AM.    2. PAF - recent admission 2 weeks ago and pt in rapid afib w/ RVR. Converted to NSR. Discharged home on PO Cardizem 240 QD and Amiodarone.  - She is maintaining NSR on telemetry. Remains on Amiodarone 200mg  BID. No longer on Cardizem as she is also on Amlodipine.  -  CHA2DS2 VASc score is 5 (CHF, HTN, Vascular disease (  CAD), Age 69-74 and female sex). However not a candidate for [facility] given long history of medication noncompliance and polysubstance abuse, using ETOH and cocaine. Amiodarone is not ideal given her not being on NOAC. She is aware of increased stroke risk.   3. Acute on Chronic Stage 3 Kidney Disease - baseline SCr ~1.7-1.9. Creatinine peaked at 2.41 on 10/1, trending down to 1.98 this AM. Continue to follow with IV diuresis.   4. HTN - BP remains elevated. She is currently on Amlodipine 10mg  daily Hydralazine 100mg  TID. Systolic BPs ranging in the 150s-180s. Continue amlodipine and hydralazine (maxed out on both agents). Would avoid restarting Cardizem as she is already on Amlodipine which would be dual CCB therapy. No BB given history of cocaine use. Not a candidate for ACE-I/ARB secondary to CKD. Continue to follow BP with diuresis. Options for further medical therapy are limited as Clonidine is a poor option given known medical noncompliance.   5. CAD - mild CAD/ coronary calcifications noted on recent coronary CTA 10/2016. Continue medical therapy and management of risk factors.  - she denies any recent anginal symptoms. No plans for further ischemic evaluation at this time.   6. COPD - per admitting team.   7. Polysubstance Abuse - UDS + for cocaine. Avoid the use of BB therapy.  - cessation advised.   8. Mild AS - noted on echocardiogram this admission.   Signed, Erma Heritage , PA-C 11:10 AM 04/08/2017 Pager: [phone]  I have seen and examined the patient along with Erma Heritage , PA-C.  I have reviewed the chart, notes and new data.  I agree  with PA's note.  Key new complaints: improved breathing Key examination changes: no overt hypervolemia by exam Key new findings / data: improving renal function since yesterday.  PLAN: BP remains elevated, but there are few additional options due to combined restrictions of acute renal insufficiency and cocaine use. On max dose amlodipine and high dose hydralazine. Avoiding RAAS inhibitors and beta blockers. Labetalol should be reasonably safe since it is primarily an alpha blocker, but compliance issues will also make this less than desirable. When renal function returns to baseline, can add back an ARB.  Sanda Klein, MD, [place] [phone] 04/08/2017, 12:07 PM

## 2017-04-09 ENCOUNTER — Inpatient Hospital Stay (HOSPITAL_COMMUNITY): Payer: Medicare Other

## 2017-04-09 DIAGNOSIS — N183 Chronic kidney disease, stage 3 (moderate): Secondary | ICD-10-CM

## 2017-04-09 DIAGNOSIS — F411 Generalized anxiety disorder: Secondary | ICD-10-CM

## 2017-04-09 DIAGNOSIS — I5030 Unspecified diastolic (congestive) heart failure: Secondary | ICD-10-CM

## 2017-04-09 DIAGNOSIS — I110 Hypertensive heart disease with heart failure: Secondary | ICD-10-CM

## 2017-04-09 DIAGNOSIS — J449 Chronic obstructive pulmonary disease, unspecified: Secondary | ICD-10-CM

## 2017-04-09 DIAGNOSIS — N179 Acute kidney failure, unspecified: Secondary | ICD-10-CM

## 2017-04-09 DIAGNOSIS — F329 Major depressive disorder, single episode, unspecified: Secondary | ICD-10-CM

## 2017-04-09 LAB — CBC
HCT: 40.5 % (ref 36.0–46.0)
Hemoglobin: 12.7 g/dL (ref 12.0–15.0)
MCH: 28.2 pg (ref 26.0–34.0)
MCHC: 31.4 g/dL (ref 30.0–36.0)
MCV: 90 fL (ref 78.0–100.0)
PLATELETS: 468 10*3/uL — AB (ref 150–400)
RBC: 4.5 MIL/uL (ref 3.87–5.11)
RDW: 16.3 % — AB (ref 11.5–15.5)
WBC: 9.1 10*3/uL (ref 4.0–10.5)

## 2017-04-09 LAB — BASIC METABOLIC PANEL
Anion gap: 7 (ref 5–15)
BUN: 41 mg/dL — AB (ref 6–20)
CALCIUM: 8.8 mg/dL — AB (ref 8.9–10.3)
CO2: 28 mmol/L (ref 22–32)
CREATININE: 1.42 mg/dL — AB (ref 0.44–1.00)
Chloride: 103 mmol/L (ref 101–111)
GFR, EST AFRICAN AMERICAN: 44 mL/min — AB (ref >60)
GFR, EST NON AFRICAN AMERICAN: 38 mL/min — AB (ref >60)
Glucose, Bld: 90 mg/dL (ref 65–99)
Potassium: 4.1 mmol/L (ref 3.5–5.1)
SODIUM: 138 mmol/L (ref 135–145)

## 2017-04-09 MED ORDER — HYDRALAZINE HCL 100 MG PO TABS: 100.0000 mg | ORAL_TABLET | Freq: Three times a day (TID) | ORAL | 2 refills | Status: AC

## 2017-04-09 MED ORDER — FUROSEMIDE 40 MG PO TABS: 40.0000 mg | ORAL_TABLET | Freq: Two times a day (BID) | ORAL | 2 refills | Status: DC

## 2017-04-09 MED ORDER — LOSARTAN POTASSIUM 25 MG PO TABS
25.0000 mg | ORAL_TABLET | Freq: Every day | ORAL | Status: DC
  Administered 2017-04-09: 25 mg via ORAL
  Filled 2017-04-09: qty 1

## 2017-04-09 MED ORDER — AMLODIPINE BESYLATE 10 MG PO TABS: 10.0000 mg | ORAL_TABLET | Freq: Every day | ORAL | 1 refills | Status: DC

## 2017-04-09 MED ORDER — AMIODARONE HCL 200 MG PO TABS: 200.0000 mg | ORAL_TABLET | Freq: Two times a day (BID) | ORAL | 1 refills | Status: AC

## 2017-04-09 MED ORDER — LOSARTAN POTASSIUM 25 MG PO TABS: 25.0000 mg | ORAL_TABLET | Freq: Every day | ORAL | 2 refills | Status: DC

## 2017-04-09 NOTE — Care Management Important Message (Signed)
Important Message  Patient Details  Name: Victoria Burke MRN: [id number] Date of Birth: February 06, 1951   Medicare Important Message Given:  Yes    Kerin Salen 04/09/2017, 1:08 PMImportant Message  Patient Details  Name: Victoria Burke MRN: [id number] Date of Birth: [birth year]   Medicare Important Message Given:  Yes    Kerin Salen 04/09/2017, 1:08 PM

## 2017-04-09 NOTE — Plan of Care (Signed)
Problem: Physical Regulation: Goal: Ability to maintain clinical measurements within normal limits will improve Outcome: Progressing Pt is able to maintain O2 sat greater than 90 on reduced O2.

## 2017-04-09 NOTE — Care Management Note (Signed)
Case Management Note  Patient Details  Name: Victoria Burke MRN: [id number] Date of Birth: 01-Feb-1951  Subjective/Objective:  Nurse to check 02 sats. If home 02 needed can arrange w/qualifying 02 sats, & home 02 order.                  Action/Plan:d/c plan home.   Expected Discharge Date:                  Expected Discharge Plan:  Home/Self Care  In-House Referral:     Discharge planning Services  CM Consult  Post Acute Care Choice:    Choice offered to:     DME Arranged:    DME Agency:     HH Arranged:    [place] Agency:     Status of Service:  Completed, signed off  If discussed at [employer] of Stay Meetings, dates discussed:    Additional Comments:  Dessa Phi, RN 04/09/2017, 3:57 PM

## 2017-04-09 NOTE — Progress Notes (Signed)
SATURATION QUALIFICATIONS: (This note is used to comply with regulatory documentation for home oxygen)  Patient Saturations on Room Air at Rest = 99%  Patient Saturations on Room Air while Ambulating = 91%  Patient Saturations on 0 Liters of oxygen while Ambulating = 91%  Please briefly explain why patient needs home oxygen: 

## 2017-04-09 NOTE — Progress Notes (Signed)
Progress Note  Patient Name: Victoria Burke Date of Encounter: 04/09/2017  Primary Cardiologist: Dr. Marlou Porch  Subjective   No chest pain or palpitations. Breathing improving. No orthopnea or PND. No edema.   Inpatient Medications    Scheduled Meds: . amiodarone  200 mg Oral BID  . amLODipine  10 mg Oral Daily  . anastrozole  1 mg Oral Daily  . aspirin EC  81 mg Oral Daily  . busPIRone  10 mg Oral BID  . colchicine  0.3 mg Oral Daily  . enoxaparin (LOVENOX) injection  30 mg Subcutaneous Q24H  . furosemide  40 mg Intravenous Daily  . gabapentin  200 mg Oral TID  . hydrALAZINE  100 mg Oral Q8H  . mouth rinse  15 mL Mouth Rinse BID  . potassium chloride  20 mEq Oral Daily  . sodium chloride flush  3 mL Intravenous Q12H  . tiotropium  18 mcg Inhalation Daily   Continuous Infusions: . sodium chloride     PRN Meds: sodium chloride, acetaminophen, albuterol, ondansetron (ZOFRAN) IV, sodium chloride flush, traZODone   Vital Signs    Vitals:   04/08/17 1436 04/08/17 2002 04/09/17 0500 04/09/17 0539  BP: (!) 157/73 (!) 167/85  (!) 171/81  Pulse: 77 80  71  Resp: 18 18  18   Temp: 98.8 F (37.1 C) 98.4 F (36.9 C)  97.9 F (36.6 C)  TempSrc: Oral Oral  Oral  SpO2: 95% 100%  95%  Weight:   122 lb 2.2 oz (55.4 kg)   Height:        Intake/Output Summary (Last 24 hours) at 04/09/17 1106 Last data filed at 04/08/17 2015  Gross per 24 hour  Intake              240 ml  Output              400 ml  Net             -160 ml   Filed Weights   04/06/17 1827 04/08/17 0500 04/09/17 0500  Weight: 123 lb 11.2 oz (56.1 kg) 122 lb 12.7 oz (55.7 kg) 122 lb 2.2 oz (55.4 kg)    Telemetry    NSR, HR in the 70's. Occasional episodes of vent trigeminy.  - Personally Reviewed  ECG    No new tracings.   Physical Exam   General: Well developed, well nourished African American female appearing in no acute distress. Head: Normocephalic, atraumatic.  Neck: Supple without bruits,  JVD not elevated. Lungs:  Resp regular and unlabored, rhonchi along upper lung fields. Heart: RRR, S1, S2, no S3, S4, or murmur; no rub. Abdomen: Soft, non-tender, non-distended with normoactive bowel sounds. No hepatomegaly. No rebound/guarding. No obvious abdominal masses. Extremities: No clubbing, cyanosis, or edema. Distal pedal pulses are 2+ bilaterally. Neuro: Alert and oriented X 3. Moves all extremities spontaneously. Psych: Normal affect.  Labs    Chemistry  Recent Labs Lab 04/06/17 1053 04/07/17 0525 04/08/17 0508 04/09/17 0505  NA 137 139 138 138  K 3.7 4.1 4.3 4.1  CL 104 104 103 103  CO2 23 26 26 28   GLUCOSE 159* 166* 91 90  BUN 33* 42* 47* 41*  CREATININE 1.94* 2.41* 1.98* 1.42*  CALCIUM 8.6* 9.1 9.0 8.8*  PROT 7.5  --   --   --   ALBUMIN 2.6*  --   --   --   AST 20  --   --   --  ALT 15  --   --   --   ALKPHOS 100  --   --   --   BILITOT 0.3  --   --   --   GFRNONAA 26* 20* 25* 38*  GFRAA 30* 23* 29* 44*  ANIONGAP 10 9 9 7      Hematology  Recent Labs Lab 04/06/17 1053 04/09/17 0505  WBC 10.3 9.1  RBC 4.28 4.50  HGB 12.1 12.7  HCT 38.7 40.5  MCV 90.4 90.0  MCH 28.3 28.2  MCHC 31.3 31.4  RDW 16.0* 16.3*  PLT 423* 468*    Cardiac EnzymesNo results for input(s): TROPONINI in the last 168 hours.   Recent Labs Lab 04/06/17 1104  TROPIPOC 0.04     BNP  Recent Labs Lab 04/06/17 1053  BNP 1,406.7*     DDimer No results for input(s): DDIMER in the last 168 hours.   Radiology    No results found.  Cardiac Studies   Echocardiogram: 04/07/2017 Study Conclusions  - Left ventricle: The cavity size was normal. There was moderate   concentric hypertrophy. Systolic function was vigorous. The   estimated ejection fraction was in the range of 65% to 70%. Wall   motion was normal; there were no regional wall motion   abnormalities. Features are consistent with a pseudonormal left   ventricular filling pattern, with concomitant abnormal  relaxation   and increased filling pressure (grade 2 diastolic dysfunction).   Doppler parameters are consistent with elevated ventricular   end-diastolic filling pressure. - Aortic valve: Possibly bicuspid; moderately thickened, severely   calcified leaflets. There was mild stenosis. Valve area (VTI):   2.82 cm^2. Valve area (Vmax): 2.75 cm^2. Valve area (Vmean): 3.32   cm^2. - Ascending aorta: The ascending aorta was not visualized. - Mitral valve: There was mild regurgitation. - Left atrium: The atrium was mildly dilated. - Right ventricle: The cavity size was moderately dilated. Wall   thickness was normal. Systolic function was moderately reduced. - Right atrium: The atrium was severely dilated. - Tricuspid valve: There was severe regurgitation. - Pulmonary arteries: Systolic pressure was severely increased. PA   peak pressure: 76 mm Hg (S). - Pericardium, extracardiac: A trivial pericardial effusion was   identified posterior to the heart. Features were not consistent   with tamponade physiology.  Patient Profile     66 y.o. female with PMH of breast CA (treated with radiation), COPD, PAF (not on [place] given history of medical noncompliance), polysubstance abuse (h/o cocaine, tobacco and ETOH), CAD, chronic diastolic HF, GERD, h/o gastric ulcer, HTN and HLDwho presented [facility] for worsening dyspnea. Cardiology consulted for assistance with management of CHF.  Assessment & Plan    1. Acute on Chronic Diastolic HF - presented with worsening dyspnea and edema with CXR and BNP (elevated at 1406) c/w with acute CHF exacerbation.  - repeat echocardiogram shows an EF of 65-70% with Grade 2 DD, mild AS, mild MR, severe TR, and severely elevated PA pressures.  - initially received IV Lasix at 60mg  but creatinine peaked at 2.41. Has since been switched to  IV Lasix 40mg  daily with a minimal recorded net output of -501 mL with weight declining by 1 pound (stable at 122 lbs).  Volume status improved but still requiring 3L [place]. Will obtain repeat CXR. Can likely switch to PO Lasix 40mg  daily.   2. PAF - recent admission 2 weeks ago and pt in rapid afib w/ RVR. Converted to NSR.  Discharged home on PO Cardizem 240 QD and Amiodarone.  - She is maintaining NSR on telemetry. Remains on Amiodarone 200mg  BID. No longer on Cardizem as she is also on Amlodipine.  -  CHA2DS2 VASc score is 5 (CHF, HTN, Vascular disease (CAD), Age 49-74 and female sex). However not a candidate for [facility] given long history of medication noncompliance and polysubstance abuse, using ETOH and cocaine. Amiodarone is not ideal given her not being on NOAC. She is aware of increased stroke risk.   3. Acute on Chronic Stage 3 Kidney Disease - baseline SCr ~1.7-1.9. Creatinine peaked at 2.41 on 10/1, trending down to 1.42 this AM.   4. HTN - BP remains elevated at 157/73 - 171/85 within the past 24 hours. She is currently on Amlodipine 10mg  daily Hydralazine 100mg  TID. Would avoid restarting Cardizem as she is already on Amlodipine which would be dual CCB therapy. No BB given history of cocaine use. Consider restarting ARB with Losartan 25mg  daily for improved BP control as creatinine has returned to baseline.   5. CAD - mild CAD/ coronary calcifications noted on recent coronary CTA 10/2016. Continue medical therapy and management of risk factors.  - she denies any recent anginal symptoms. No plans for further ischemic evaluation at this time.   6. COPD - per admitting team.   7. Polysubstance Abuse - UDS + for cocaine. Avoid the use of BB therapy.  - cessation advised.   8. Mild AS - noted on echocardiogram this admission. Continue to follow as an outpatient.    Arna Medici , PA-C 11:06 AM 04/09/2017 Pager: [phone]  I have seen and examined the patient along with Erma Heritage , PA-C.  I have reviewed the chart, notes and new data.  I agree with PA's note.  Key  new complaints: not very communicative Key examination changes: no overt CHF Key new findings / data: rapid improvement in renal function.  PLAN: Add low dose ARB for HTN.  Sanda Klein, MD, [place] [phone] 04/09/2017, 12:50 PM

## 2017-04-09 NOTE — Discharge Summary (Signed)
Physician Discharge Summary  Victoria Burke NIO:[id number] DOB: 1951/03/18 DOA: 04/06/2017  PCP: Dorena Dew, FNP  Admit date: 04/06/2017 Discharge date: 04/09/2017  Time spent: 35 minutes  Recommendations for Outpatient Follow-up:  Reassess BP and adjust antihypertensive drugs as needed  Please repeat BMET to follow renal function and electrolytes  Please assist as needed with drugs cessation and further counseling   Discharge Diagnoses:  Principal Problem:   Acute on chronic diastolic CHF (congestive heart failure) ([place]) Active Problems:   History of breast cancer   Depression   Cocaine abuse ([place])   Chronic obstructive pulmonary disease (HCC)   Generalized anxiety disorder   CAD (coronary artery disease), native coronary artery   Kidney disease, chronic, stage III (GFR 30-59 ml/min) (HCC)   Hypertensive heart disease   Acute renal failure superimposed on stage 3 chronic kidney disease ([place])   Discharge Condition: stable and improved. Discharge home with instructions to follow up with PCP in 10 days.  Diet recommendation: heart healthy diet   Filed Weights   04/06/17 1827 04/08/17 0500 04/09/17 0500  Weight: 56.1 kg (123 lb 11.2 oz) 55.7 kg (122 lb 12.7 oz) 55.4 kg (122 lb 2.2 oz)    History of present illness:  As per H&P written by Dr. Charlies Silvers on 04/06/17 66 y.o. female with medical history significant for chronic diastolic CHF with preserved EF based on recent ECHO in 02/2017, a fib on aspirin (not on AC due to non-compliance), alcohol and cocaine abuse, CKD stage 3 who presented to ED with worsening shortness of breath at rest and with exertion for past few days prior to the admission associated with fatigue. No cough, no fever or chest pain. No palpitations. No abdominal pain, nausea or vomiting. No lightheadedness or loss of consciousness. Recent admission from 03/23/2017 - 03/30/2017 - for atrial fibrillation (not started on coumadin due to non-compliance and ongoing  alcohol and cocaine abuse.  ED Course:  Oxygen saturation was in 60's on arrival to ED. CXR showed worsening changes of congestive heart failure with interval alveolar edema superimposed on interstitial edema. BNP was in 1400 range. She was given lasix 60 mg IV dose once. She was also given nebulizer treatments but continued to be short of breath. Cardio consulted.  Hospital Course:  Acute on chronic diastolic CHF (congestive heart failure) (HCC) / Acute respiratory failure with hypoxia  -BNP on admission 1,406 -CXR on admission showed worsening changes of congestive heart failure with interval alveolar edema superimposed on interstitial edema -ECHO 02/2017 showed EF 55%, grade 2 DD -Patient diuresed until euvolemic; not longer  -Continue daily weight  -ARB added for better BP control -discharge home on adjusted dose of lasix -patient advise to follow up in 10 days with PCP  History of breast cancer - Continue Arimidex   Depression / Generalized anxiety disorder - Continue Buspar and gabapentin   Cocaine abuse - UDS positive for cocaine during this admission  - Counseled on drug abuse cessation   Chronic obstructive pulmonary disease ([place]) / Tobacco abuse -Counseled on cessation -continue albuterol and spiriva -not wheezing and with good O2 sat on RA  CAD (coronary artery disease), native coronary artery -No CP -Continue daily aspirin  -advise to follow heart healthy diet and to stop smoking   acute on chronic Kidney disease, chronic, stage III (GFR 30-59 ml/min) -baseline in 1.6-1.8 range -most likely due to acute CHF -Cr initially trended up to 2.41; at discharge Cr 1.4 -repeat BMET at follow up -advise  to maintain adequate hydration and to follow heart healthy diet   Hypertensive heart disease -Continue Norvasc, adjusted dose of Hydralazine and cozaar  -Lasix will also help controlling BP -patient advise to follow heart healthy diet    Paroxysmal A fib -CHA2DS2-VASc Score5 -Not on Anticoagulation due to non-compliance and ongoing alcohol and cocaine abuse (positive UDS during this admission)  -Continue amiodarone for rate control -Continue ASA  Procedures:  2-D echo - Left ventricle: The cavity size was normal. There was moderate   concentric hypertrophy. Systolic function was vigorous. The   estimated ejection fraction was in the range of 65% to 70%. Wall   motion was normal; there were no regional wall motion   abnormalities. Features are consistent with a pseudonormal left   ventricular filling pattern, with concomitant abnormal relaxation   and increased filling pressure (grade 2 diastolic dysfunction).   Doppler parameters are consistent with elevated ventricular   end-diastolic filling pressure. - Aortic valve: Possibly bicuspid; moderately thickened, severely   calcified leaflets. There was mild stenosis. Valve area (VTI):   2.82 cm^2. Valve area (Vmax): 2.75 cm^2. Valve area (Vmean): 3.32   cm^2. - Ascending aorta: The ascending aorta was not visualized. - Mitral valve: There was mild regurgitation. - Left atrium: The atrium was mildly dilated. - Right ventricle: The cavity size was moderately dilated. Wall   thickness was normal. Systolic function was moderately reduced. - Right atrium: The atrium was severely dilated. - Tricuspid valve: There was severe regurgitation. - Pulmonary arteries: Systolic pressure was severely increased. PA   peak pressure: 76 mm Hg (S). - Pericardium, extracardiac: A trivial pericardial effusion was   identified posterior to the heart. Features were not consistent   with tamponade physiology.  Consultations:  Cardiology service   Discharge Exam: Vitals:   04/09/17 0539 04/09/17 1413  BP: (!) 171/81 (!) 144/64  Pulse: 71 74  Resp: 18 18  Temp: 97.9 F (36.6 C) 98.6 F (37 C)  SpO2: 95% 97%   Constitutional: Appears underweight, in no distress; denies CP  and SOB. CVS: RRR, S1/S2 appreciated on exam; no JVD. Positive soft SEM on amplified electronic exam Pulmonary: Effort and breath sounds normal, no stridor, no wheezing, no rales.   Abdominal: Soft. BS +,  no distension, tenderness, rebound or guarding.  Musculoskeletal: Normal range of motion. No cyanosis, no edema and no clubbing.  Neuro: Alert. Normal reflexes, muscle tone coordination. No cranial nerve deficit. Skin: Skin is warm and dry.  Psychiatric: Normal mood and affect. Behavior, judgment and thought content normal.   Discharge Instructions   Discharge Instructions    (HEART FAILURE PATIENTS) Call MD:  Anytime you have any of the following symptoms: 1) 3 pound weight gain in 24 hours or 5 pounds in 1 week 2) shortness of breath, with or without a dry hacking cough 3) swelling in the hands, feet or stomach 4) if you have to sleep on extra pillows at night in order to breathe.    Complete by:  As directed    Diet - low sodium heart healthy    Complete by:  As directed    Discharge instructions    Complete by:  As directed    Take medications as prescribed  Arrange follow up with PCP in 10 days  Follow low sodium diet (less than 2,000 mg daily) Maintain adequate hydration  Stop the use of recreational drugs   Increase activity slowly    Complete by:  As directed  Current Discharge Medication List    START taking these medications   Details  losartan (COZAAR) 25 MG tablet Take 1 tablet (25 mg total) by mouth daily. Qty: 30 tablet, Refills: 2      CONTINUE these medications which have CHANGED   Details  amiodarone (PACERONE) 200 MG tablet Take 1 tablet (200 mg total) by mouth 2 (two) times daily. Qty: 60 tablet, Refills: 1    amLODipine (NORVASC) 10 MG tablet Take 1 tablet (10 mg total) by mouth daily. Qty: 30 tablet, Refills: 1    furosemide (LASIX) 40 MG tablet Take 1 tablet (40 mg total) by mouth 2 (two) times daily. Qty: 60 tablet, Refills: 2    hydrALAZINE  (APRESOLINE) 100 MG tablet Take 1 tablet (100 mg total) by mouth every 8 (eight) hours. Qty: 90 tablet, Refills: 2      CONTINUE these medications which have NOT CHANGED   Details  albuterol (PROVENTIL HFA;VENTOLIN HFA) 108 (90 Base) MCG/ACT inhaler Inhale 1-2 puffs into the lungs every 6 (six) hours as needed for wheezing or shortness of breath. Qty: 3 Inhaler, Refills: 3    anastrozole (ARIMIDEX) 1 MG tablet Take 1 tablet (1 mg total) by mouth daily. Qty: 30 tablet, Refills: 5    aspirin EC 81 MG EC tablet Take 1 tablet (81 mg total) by mouth daily. Qty: 30 tablet, Refills: 1    busPIRone (BUSPAR) 10 MG tablet Take 1 tablet (10 mg total) by mouth 2 (two) times daily. Qty: 60 tablet, Refills: 2   Associated Diagnoses: Generalized anxiety disorder    colchicine 0.6 MG tablet Take 1 tablet (0.6 mg total) by mouth daily. Qty: 30 tablet, Refills: 2    gabapentin (NEURONTIN) 300 MG capsule Take 1 capsule (300 mg total) by mouth 3 (three) times daily. Qty: 90 capsule, Refills: 0   Associated Diagnoses: Essential hypertension    tiotropium (SPIRIVA) 18 MCG inhalation capsule Place 1 capsule (18 mcg total) into inhaler and inhale daily. Qty: 30 capsule, Refills: 0    traZODone (DESYREL) 50 MG tablet Take 0.5 tablets (25 mg total) by mouth at bedtime as needed for sleep. Qty: 30 tablet, Refills: 0   Associated Diagnoses: Insomnia, unspecified type      STOP taking these medications     diltiazem (CARDIZEM CD) 240 MG 24 hr capsule        No Known Allergies Follow-up Information    Dorena Dew, FNP. Schedule an appointment as soon as possible for a visit in 10 day(s).   Specialty:  Family Medicine Contact information: [place]. [place] [address] [phone]           The results of significant diagnostics from this hospitalization (including imaging, microbiology, ancillary and laboratory) are listed below for reference.    Significant  Diagnostic Studies: Dg Chest 2 View  Result Date: 04/06/2017 CLINICAL DATA:  Shortness breath on exertion.  Long history of COPD. EXAM: CHEST  2 VIEW COMPARISON:  03/26/2017. FINDINGS: Stable enlargement of the cardiac silhouette. Diffuse increase in prominence of the interstitial markings with mild diffuse bilateral alveolar opacities. No significant change in a moderate-sized right pleural effusion. Mild increase in size of a smaller left pleural effusion. Atheromatous aortic calcifications. Left axillary surgical clips. Mild thoracic spine degenerative changes. IMPRESSION: 1. Worsening changes of congestive heart failure with interval alveolar edema superimposed on interstitial edema. 2. Mild increase in amount of left pleural fluid and stable right pleural fluid. 3. Stable cardiomegaly.  Electronically Signed   By: Claudie Revering M.D.   On: 04/06/2017 11:13   Dg Chest 2 View  Result Date: 03/26/2017 CLINICAL DATA:  Shortness of breath, COPD, CHF EXAM: CHEST  2 VIEW COMPARISON:  03/23/2017 FINDINGS: Cardiomegaly evident with diffuse mild interstitial edema pattern and pleural effusions, larger on the right. Pattern compatible with CHF. Associated bibasilar atelectasis/consolidation. No pneumothorax. Atherosclerosis of the aorta. Trachea is midline. IMPRESSION: CHF pattern similar to the prior study. Electronically Signed   By: Jerilynn Mages.  Shick M.D.   On: 03/26/2017 21:59   Dg Chest Port 1 View  Result Date: 04/09/2017 CLINICAL DATA:  Shortness of Breath EXAM: PORTABLE CHEST 1 VIEW COMPARISON:  04/06/2017 FINDINGS: Cardiac shadow remains enlarged. Aortic calcifications are again seen. Right pleural effusion and likely underlying atelectasis is identified although aeration is improved when compared with the prior exam. The degree of vascular congestion has also improved from the prior study. No bony abnormality is seen. IMPRESSION: Improved CHF when compare with the prior exam. Electronically Signed   By: Inez Catalina M.D.   On: 04/09/2017 12:10   Dg Chest Portable 1 View  Result Date: 03/23/2017 CLINICAL DATA:  Shortness of breath EXAM: PORTABLE CHEST 1 VIEW COMPARISON:  02/04/2017, 02/03/2017 FINDINGS: Bilateral pleural effusions. Cardiomegaly with aortic atherosclerosis. Central vascular congestion. Mild diffuse increased interstitial opacity suggesting mild background edema. Patchy focal opacities in the left and right upper lobes could reflect mild infiltrates. IMPRESSION: 1. Cardiomegaly with central vascular congestion and bilateral effusions. Mild diffuse interstitial prominence suggesting mild background edema 2. More focal pulmonary opacities in the right and left upper lobes could relate to superimposed infiltrates. Atelectasis or infiltrates at the bilateral lung bases. Electronically Signed   By: Donavan Foil M.D.   On: 03/23/2017 21:19    Labs: Basic Metabolic Panel:  Recent Labs Lab 04/06/17 1053 04/07/17 0525 04/08/17 0508 04/09/17 0505  NA 137 139 138 138  K 3.7 4.1 4.3 4.1  CL 104 104 103 103  CO2 23 26 26 28   GLUCOSE 159* 166* 91 90  BUN 33* 42* 47* 41*  CREATININE 1.94* 2.41* 1.98* 1.42*  CALCIUM 8.6* 9.1 9.0 8.8*   Liver Function Tests:  Recent Labs Lab 04/06/17 1053  AST 20  ALT 15  ALKPHOS 100  BILITOT 0.3  PROT 7.5  ALBUMIN 2.6*   CBC:  Recent Labs Lab 04/06/17 1053 04/09/17 0505  WBC 10.3 9.1  NEUTROABS 8.0*  --   HGB 12.1 12.7  HCT 38.7 40.5  MCV 90.4 90.0  PLT 423* 468*   BNP (last 3 results)  Recent Labs  02/03/17 1046 03/23/17 2058 04/06/17 1053  BNP 1,567.3* 1,041.9* 1,406.7*    Signed:  Barton Dubois MD.  [facility] 04/09/2017, 4:35 PM

## 2017-04-10 ENCOUNTER — Institutional Professional Consult (permissible substitution): Payer: Medicare Other | Admitting: Internal Medicine

## 2017-04-29 ENCOUNTER — Encounter (HOSPITAL_COMMUNITY): Payer: Self-pay

## 2017-04-29 ENCOUNTER — Emergency Department (HOSPITAL_COMMUNITY): Payer: Medicare Other

## 2017-04-29 ENCOUNTER — Inpatient Hospital Stay (HOSPITAL_COMMUNITY)
Admission: EM | Admit: 2017-04-29 | Discharge: 2017-05-05 | DRG: 291 | Disposition: A | Discharge status: Home / Self Care | Payer: Medicare Other | Attending: Internal Medicine | Admitting: Internal Medicine

## 2017-04-29 DIAGNOSIS — Z853 Personal history of malignant neoplasm of breast: Secondary | ICD-10-CM

## 2017-04-29 DIAGNOSIS — I248 Other forms of acute ischemic heart disease: Secondary | ICD-10-CM | POA: Diagnosis present

## 2017-04-29 DIAGNOSIS — Z9889 Other specified postprocedural states: Secondary | ICD-10-CM

## 2017-04-29 DIAGNOSIS — N183 Chronic kidney disease, stage 3 unspecified: Secondary | ICD-10-CM | POA: Diagnosis present

## 2017-04-29 DIAGNOSIS — Z79899 Other long term (current) drug therapy: Secondary | ICD-10-CM

## 2017-04-29 DIAGNOSIS — J449 Chronic obstructive pulmonary disease, unspecified: Secondary | ICD-10-CM | POA: Diagnosis not present

## 2017-04-29 DIAGNOSIS — Z7982 Long term (current) use of aspirin: Secondary | ICD-10-CM | POA: Diagnosis not present

## 2017-04-29 DIAGNOSIS — Y95 Nosocomial condition: Secondary | ICD-10-CM | POA: Diagnosis present

## 2017-04-29 DIAGNOSIS — I130 Hypertensive heart and chronic kidney disease with heart failure and stage 1 through stage 4 chronic kidney disease, or unspecified chronic kidney disease: Secondary | ICD-10-CM | POA: Diagnosis present

## 2017-04-29 DIAGNOSIS — I444 Left anterior fascicular block: Secondary | ICD-10-CM | POA: Diagnosis present

## 2017-04-29 DIAGNOSIS — R739 Hyperglycemia, unspecified: Secondary | ICD-10-CM | POA: Diagnosis not present

## 2017-04-29 DIAGNOSIS — K219 Gastro-esophageal reflux disease without esophagitis: Secondary | ICD-10-CM | POA: Diagnosis present

## 2017-04-29 DIAGNOSIS — J440 Chronic obstructive pulmonary disease with acute lower respiratory infection: Secondary | ICD-10-CM | POA: Diagnosis present

## 2017-04-29 DIAGNOSIS — I2510 Atherosclerotic heart disease of native coronary artery without angina pectoris: Secondary | ICD-10-CM | POA: Diagnosis not present

## 2017-04-29 DIAGNOSIS — I5033 Acute on chronic diastolic (congestive) heart failure: Secondary | ICD-10-CM | POA: Diagnosis present

## 2017-04-29 DIAGNOSIS — R0600 Dyspnea, unspecified: Secondary | ICD-10-CM

## 2017-04-29 DIAGNOSIS — F411 Generalized anxiety disorder: Secondary | ICD-10-CM | POA: Diagnosis present

## 2017-04-29 DIAGNOSIS — J9601 Acute respiratory failure with hypoxia: Secondary | ICD-10-CM | POA: Diagnosis present

## 2017-04-29 DIAGNOSIS — I480 Paroxysmal atrial fibrillation: Secondary | ICD-10-CM

## 2017-04-29 DIAGNOSIS — J90 Pleural effusion, not elsewhere classified: Secondary | ICD-10-CM

## 2017-04-29 DIAGNOSIS — Z923 Personal history of irradiation: Secondary | ICD-10-CM

## 2017-04-29 DIAGNOSIS — Z8550 Personal history of malignant neoplasm of unspecified urinary tract organ: Secondary | ICD-10-CM | POA: Diagnosis not present

## 2017-04-29 DIAGNOSIS — T380X5A Adverse effect of glucocorticoids and synthetic analogues, initial encounter: Secondary | ICD-10-CM | POA: Diagnosis not present

## 2017-04-29 DIAGNOSIS — J189 Pneumonia, unspecified organism: Secondary | ICD-10-CM | POA: Insufficient documentation

## 2017-04-29 DIAGNOSIS — E785 Hyperlipidemia, unspecified: Secondary | ICD-10-CM | POA: Diagnosis present

## 2017-04-29 DIAGNOSIS — I700 Atherosclerosis of aorta: Secondary | ICD-10-CM | POA: Diagnosis present

## 2017-04-29 DIAGNOSIS — M109 Gout, unspecified: Secondary | ICD-10-CM | POA: Diagnosis present

## 2017-04-29 LAB — I-STAT ARTERIAL BLOOD GAS, ED
Acid-base deficit: 5 mmol/L — ABNORMAL HIGH (ref 0.0–2.0)
BICARBONATE: 19.7 mmol/L — AB (ref 20.0–28.0)
O2 Saturation: 93 %
PCO2 ART: 34.8 mmHg (ref 32.0–48.0)
TCO2: 21 mmol/L — ABNORMAL LOW (ref 22–32)
pH, Arterial: 7.36 (ref 7.350–7.450)
pO2, Arterial: 66 mmHg — ABNORMAL LOW (ref 83.0–108.0)

## 2017-04-29 LAB — COMPREHENSIVE METABOLIC PANEL
ALBUMIN: 2.7 g/dL — AB (ref 3.5–5.0)
ALT: 12 U/L — AB (ref 14–54)
AST: 20 U/L (ref 15–41)
Alkaline Phosphatase: 106 U/L (ref 38–126)
Anion gap: 11 (ref 5–15)
BUN: 26 mg/dL — AB (ref 6–20)
CHLORIDE: 111 mmol/L (ref 101–111)
CO2: 19 mmol/L — AB (ref 22–32)
CREATININE: 1.49 mg/dL — AB (ref 0.44–1.00)
Calcium: 9 mg/dL (ref 8.9–10.3)
GFR calc Af Amer: 41 mL/min — ABNORMAL LOW (ref >60)
GFR, EST NON AFRICAN AMERICAN: 36 mL/min — AB (ref >60)
Glucose, Bld: 154 mg/dL — ABNORMAL HIGH (ref 65–99)
POTASSIUM: 3.5 mmol/L (ref 3.5–5.1)
SODIUM: 141 mmol/L (ref 135–145)
Total Bilirubin: 0.6 mg/dL (ref 0.3–1.2)
Total Protein: 7.7 g/dL (ref 6.5–8.1)

## 2017-04-29 LAB — CBC WITH DIFFERENTIAL/PLATELET
BASOS ABS: 0 10*3/uL (ref 0.0–0.1)
BASOS PCT: 0 %
Eosinophils Absolute: 0 10*3/uL (ref 0.0–0.7)
Eosinophils Relative: 0 %
HEMATOCRIT: 37.7 % (ref 36.0–46.0)
HEMOGLOBIN: 12 g/dL (ref 12.0–15.0)
LYMPHS PCT: 2 %
Lymphs Abs: 0.2 10*3/uL — ABNORMAL LOW (ref 0.7–4.0)
MCH: 28.2 pg (ref 26.0–34.0)
MCHC: 31.8 g/dL (ref 30.0–36.0)
MCV: 88.7 fL (ref 78.0–100.0)
MONOS PCT: 1 %
Monocytes Absolute: 0.1 10*3/uL (ref 0.1–1.0)
NEUTROS ABS: 8.4 10*3/uL — AB (ref 1.7–7.7)
NEUTROS PCT: 97 %
Platelets: 330 10*3/uL (ref 150–400)
RBC: 4.25 MIL/uL (ref 3.87–5.11)
RDW: 16.1 % — ABNORMAL HIGH (ref 11.5–15.5)
WBC: 8.7 10*3/uL (ref 4.0–10.5)

## 2017-04-29 LAB — BRAIN NATRIURETIC PEPTIDE: B Natriuretic Peptide: 1512.9 pg/mL — ABNORMAL HIGH (ref 0.0–100.0)

## 2017-04-29 LAB — I-STAT TROPONIN, ED: TROPONIN I, POC: 0.07 ng/mL (ref 0.00–0.08)

## 2017-04-29 LAB — TROPONIN I: TROPONIN I: 0.06 ng/mL — AB (ref <0.03)

## 2017-04-29 LAB — LACTIC ACID, PLASMA: Lactic Acid, Venous: 1.1 mmol/L (ref 0.5–1.9)

## 2017-04-29 MED ORDER — METHYLPREDNISOLONE SODIUM SUCC 40 MG IJ SOLR
40.0000 mg | Freq: Two times a day (BID) | INTRAMUSCULAR | Status: DC
  Administered 2017-04-30 – 2017-05-02 (×6): 40 mg via INTRAVENOUS
  Filled 2017-04-29 (×6): qty 1

## 2017-04-29 MED ORDER — DEXTROSE 5 % IV SOLN
2.0000 g | INTRAVENOUS | Status: DC
  Administered 2017-04-30 (×2): 2 g via INTRAVENOUS
  Filled 2017-04-29 (×2): qty 2

## 2017-04-29 MED ORDER — MAGNESIUM SULFATE 2 GM/50ML IV SOLN
2.0000 g | Freq: Once | INTRAVENOUS | Status: AC
  Administered 2017-04-29: 2 g via INTRAVENOUS
  Filled 2017-04-29: qty 50

## 2017-04-29 MED ORDER — SODIUM CHLORIDE 0.9% FLUSH: 3.0000 mL | INTRAVENOUS | Status: DC | PRN

## 2017-04-29 MED ORDER — ALBUTEROL SULFATE (2.5 MG/3ML) 0.083% IN NEBU: 2.5000 mg | INHALATION_SOLUTION | RESPIRATORY_TRACT | Status: DC | PRN

## 2017-04-29 MED ORDER — SODIUM CHLORIDE 0.9 % IV SOLN: 250.0000 mL | INTRAVENOUS | Status: DC | PRN

## 2017-04-29 MED ORDER — ANASTROZOLE 1 MG PO TABS
1.0000 mg | ORAL_TABLET | Freq: Every day | ORAL | Status: DC
Start: 2017-04-30 — End: 2017-05-05
  Administered 2017-04-30 – 2017-05-05 (×6): 1 mg via ORAL
  Filled 2017-04-29 (×6): qty 1

## 2017-04-29 MED ORDER — IPRATROPIUM-ALBUTEROL 0.5-2.5 (3) MG/3ML IN SOLN
3.0000 mL | Freq: Once | RESPIRATORY_TRACT | Status: AC
  Administered 2017-04-29: 3 mL via RESPIRATORY_TRACT
  Filled 2017-04-29: qty 3

## 2017-04-29 MED ORDER — DEXTROSE 5 % IV SOLN: 1.0000 g | Freq: Once | INTRAVENOUS | Status: DC

## 2017-04-29 MED ORDER — METHYLPREDNISOLONE SODIUM SUCC 125 MG IJ SOLR
125.0000 mg | Freq: Once | INTRAMUSCULAR | Status: AC
  Administered 2017-04-29: 125 mg via INTRAVENOUS
  Filled 2017-04-29: qty 2

## 2017-04-29 MED ORDER — AZITHROMYCIN 250 MG PO TABS: 1000.0000 mg | ORAL_TABLET | Freq: Once | ORAL | Status: DC

## 2017-04-29 MED ORDER — IOPAMIDOL (ISOVUE-370) INJECTION 76%
INTRAVENOUS | Status: AC
  Administered 2017-04-29: 100 mL
  Filled 2017-04-29: qty 100

## 2017-04-29 MED ORDER — HEPARIN SODIUM (PORCINE) 5000 UNIT/ML IJ SOLN
5000.0000 [IU] | Freq: Three times a day (TID) | INTRAMUSCULAR | Status: DC
  Administered 2017-04-30 – 2017-05-05 (×16): 5000 [IU] via SUBCUTANEOUS
  Filled 2017-04-29 (×16): qty 1

## 2017-04-29 MED ORDER — GABAPENTIN 300 MG PO CAPS
300.0000 mg | ORAL_CAPSULE | Freq: Three times a day (TID) | ORAL | Status: DC
  Administered 2017-04-30 – 2017-05-05 (×17): 300 mg via ORAL
  Filled 2017-04-29 (×19): qty 1

## 2017-04-29 MED ORDER — ALBUTEROL (5 MG/ML) CONTINUOUS INHALATION SOLN
10.0000 mg/h | INHALATION_SOLUTION | RESPIRATORY_TRACT | Status: AC
  Administered 2017-04-29: 10 mg/h via RESPIRATORY_TRACT
  Filled 2017-04-29: qty 20

## 2017-04-29 MED ORDER — PREDNISONE 20 MG PO TABS: 50.0000 mg | ORAL_TABLET | Freq: Every day | ORAL | Status: DC

## 2017-04-29 MED ORDER — AMIODARONE HCL 200 MG PO TABS
200.0000 mg | ORAL_TABLET | Freq: Two times a day (BID) | ORAL | Status: DC
  Administered 2017-04-30 – 2017-05-05 (×12): 200 mg via ORAL
  Filled 2017-04-29 (×12): qty 1

## 2017-04-29 MED ORDER — VANCOMYCIN HCL IN DEXTROSE 750-5 MG/150ML-% IV SOLN
750.0000 mg | INTRAVENOUS | Status: DC
  Administered 2017-04-30 – 2017-05-01 (×3): 750 mg via INTRAVENOUS
  Filled 2017-04-29 (×3): qty 150

## 2017-04-29 MED ORDER — TIOTROPIUM BROMIDE MONOHYDRATE 18 MCG IN CAPS
18.0000 ug | ORAL_CAPSULE | Freq: Every day | RESPIRATORY_TRACT | Status: DC
  Filled 2017-04-29: qty 5

## 2017-04-29 MED ORDER — TRAZODONE HCL 50 MG PO TABS
25.0000 mg | ORAL_TABLET | Freq: Every evening | ORAL | Status: DC | PRN
  Administered 2017-04-30 – 2017-05-02 (×4): 25 mg via ORAL
  Filled 2017-04-29 (×4): qty 1

## 2017-04-29 MED ORDER — ASPIRIN EC 81 MG PO TBEC
81.0000 mg | DELAYED_RELEASE_TABLET | Freq: Every day | ORAL | Status: DC
  Administered 2017-04-30 – 2017-05-05 (×6): 81 mg via ORAL
  Filled 2017-04-29 (×6): qty 1

## 2017-04-29 MED ORDER — HYDRALAZINE HCL 50 MG PO TABS
100.0000 mg | ORAL_TABLET | Freq: Three times a day (TID) | ORAL | Status: DC
  Administered 2017-04-30 – 2017-05-05 (×18): 100 mg via ORAL
  Filled 2017-04-29 (×18): qty 2

## 2017-04-29 MED ORDER — LORAZEPAM 2 MG/ML IJ SOLN
1.0000 mg | Freq: Once | INTRAMUSCULAR | Status: AC
  Administered 2017-04-29: 1 mg via INTRAVENOUS
  Filled 2017-04-29: qty 1

## 2017-04-29 MED ORDER — LOSARTAN POTASSIUM 25 MG PO TABS
25.0000 mg | ORAL_TABLET | Freq: Every day | ORAL | Status: DC
  Filled 2017-04-29: qty 1

## 2017-04-29 MED ORDER — FUROSEMIDE 10 MG/ML IJ SOLN
40.0000 mg | Freq: Once | INTRAMUSCULAR | Status: AC
Start: 2017-04-29 — End: 2017-04-29
  Administered 2017-04-29: 40 mg via INTRAVENOUS
  Filled 2017-04-29: qty 4

## 2017-04-29 MED ORDER — AMLODIPINE BESYLATE 10 MG PO TABS: 10.0000 mg | ORAL_TABLET | Freq: Every day | ORAL | Status: DC

## 2017-04-29 MED ORDER — HYDRALAZINE HCL 20 MG/ML IJ SOLN
10.0000 mg | INTRAMUSCULAR | Status: DC | PRN
  Administered 2017-05-02: 10 mg via INTRAVENOUS
  Filled 2017-04-29: qty 1

## 2017-04-29 MED ORDER — FUROSEMIDE 10 MG/ML IJ SOLN: 40.0000 mg | Freq: Two times a day (BID) | INTRAMUSCULAR | Status: DC

## 2017-04-29 MED ORDER — SODIUM CHLORIDE 0.9% FLUSH
3.0000 mL | Freq: Two times a day (BID) | INTRAVENOUS | Status: DC
  Administered 2017-04-29 – 2017-05-05 (×10): 3 mL via INTRAVENOUS

## 2017-04-29 MED ORDER — ASPIRIN 81 MG PO CHEW: 324.0000 mg | CHEWABLE_TABLET | Freq: Once | ORAL | Status: DC

## 2017-04-29 MED ORDER — BUSPIRONE HCL 10 MG PO TABS
10.0000 mg | ORAL_TABLET | Freq: Two times a day (BID) | ORAL | Status: DC
  Administered 2017-04-30 – 2017-05-05 (×12): 10 mg via ORAL
  Filled 2017-04-29: qty 2
  Filled 2017-04-29: qty 1
  Filled 2017-04-29 (×2): qty 2
  Filled 2017-04-29: qty 1
  Filled 2017-04-29 (×4): qty 2
  Filled 2017-04-29 (×2): qty 1
  Filled 2017-04-29: qty 2

## 2017-04-29 MED ORDER — SODIUM CHLORIDE 0.9 % IV SOLN
INTRAVENOUS | Status: DC
  Administered 2017-04-29: 20:00:00 via INTRAVENOUS

## 2017-04-29 NOTE — ED Notes (Signed)
Attempted to call son with numbers in the Chart.  No asnwer, left message requesting son call [facility] ED.

## 2017-04-29 NOTE — Progress Notes (Signed)
Pharmacy Antibiotic Note  Victoria Burke is a 66 y.o. female admitted on 04/29/2017 with pneumonia.  Pharmacy has been consulted for Vancomycin dosing. Presents to ED with shortness of breath. Multiple recent admissions. WBC WNL. Noted renal dysfunction.   Plan: Vancomycin 750 mg IV q24h Cefepime per MD Trend WBC, temp, renal function  F/U infectious work-up Drug levels as indicated  Temp (24hrs), Avg:97.8 F (36.6 C), Min:97.8 F (36.6 C), Max:97.8 F (36.6 C)   Recent Labs Lab 04/29/17 1815 04/29/17 2232  WBC  --  8.7  CREATININE 1.49*  --     CrCl cannot be calculated (Unknown ideal weight.).    No Known Allergies  [name] 04/29/2017 11:00 PM
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
## 2017-04-29 NOTE — ED Triage Notes (Signed)
Pt coming from home with complaint of shortness of breath, some SOB and wheezing noted with EMS following exertion. Pt does not wear O2 at home.   20g LFA  Meds PTA: 10mg  albuterol, 0.5mg  atrovent, 125mg  solumedrol.  160/87, 84bpm, SPO2% 100

## 2017-04-29 NOTE — ED Notes (Signed)
Patient taken to CT, BiPap changed to venti mask for transport.  12 Lpm at 50% on venti mask.  O2 sats 97% on mask

## 2017-04-30 ENCOUNTER — Inpatient Hospital Stay (HOSPITAL_COMMUNITY): Payer: Medicare Other

## 2017-04-30 DIAGNOSIS — J189 Pneumonia, unspecified organism: Secondary | ICD-10-CM

## 2017-04-30 DIAGNOSIS — I5033 Acute on chronic diastolic (congestive) heart failure: Secondary | ICD-10-CM

## 2017-04-30 DIAGNOSIS — J449 Chronic obstructive pulmonary disease, unspecified: Secondary | ICD-10-CM

## 2017-04-30 DIAGNOSIS — F411 Generalized anxiety disorder: Secondary | ICD-10-CM

## 2017-04-30 DIAGNOSIS — J9601 Acute respiratory failure with hypoxia: Secondary | ICD-10-CM

## 2017-04-30 DIAGNOSIS — I2510 Atherosclerotic heart disease of native coronary artery without angina pectoris: Secondary | ICD-10-CM

## 2017-04-30 DIAGNOSIS — N183 Chronic kidney disease, stage 3 (moderate): Secondary | ICD-10-CM

## 2017-04-30 LAB — MRSA PCR SCREENING: MRSA BY PCR: NEGATIVE

## 2017-04-30 LAB — CBC WITH DIFFERENTIAL/PLATELET
BASOS PCT: 0 %
Basophils Absolute: 0 10*3/uL (ref 0.0–0.1)
EOS ABS: 0 10*3/uL (ref 0.0–0.7)
Eosinophils Relative: 0 %
HCT: 39.9 % (ref 36.0–46.0)
HEMOGLOBIN: 12.7 g/dL (ref 12.0–15.0)
LYMPHS ABS: 0.2 10*3/uL — AB (ref 0.7–4.0)
Lymphocytes Relative: 2 %
MCH: 28.3 pg (ref 26.0–34.0)
MCHC: 31.8 g/dL (ref 30.0–36.0)
MCV: 89.1 fL (ref 78.0–100.0)
Monocytes Absolute: 0 10*3/uL — ABNORMAL LOW (ref 0.1–1.0)
Monocytes Relative: 1 %
NEUTROS PCT: 97 %
Neutro Abs: 7.2 10*3/uL (ref 1.7–7.7)
Platelets: 323 10*3/uL (ref 150–400)
RBC: 4.48 MIL/uL (ref 3.87–5.11)
RDW: 16.5 % — ABNORMAL HIGH (ref 11.5–15.5)
WBC: 7.4 10*3/uL (ref 4.0–10.5)

## 2017-04-30 LAB — BASIC METABOLIC PANEL
ANION GAP: 11 (ref 5–15)
BUN: 30 mg/dL — ABNORMAL HIGH (ref 6–20)
CALCIUM: 8.8 mg/dL — AB (ref 8.9–10.3)
CHLORIDE: 109 mmol/L (ref 101–111)
CO2: 19 mmol/L — ABNORMAL LOW (ref 22–32)
CREATININE: 1.74 mg/dL — AB (ref 0.44–1.00)
GFR calc non Af Amer: 30 mL/min — ABNORMAL LOW (ref >60)
GFR, EST AFRICAN AMERICAN: 34 mL/min — AB (ref >60)
Glucose, Bld: 225 mg/dL — ABNORMAL HIGH (ref 65–99)
Potassium: 3.2 mmol/L — ABNORMAL LOW (ref 3.5–5.1)
SODIUM: 139 mmol/L (ref 135–145)

## 2017-04-30 LAB — TROPONIN I
TROPONIN I: 0.04 ng/mL — AB (ref <0.03)
Troponin I: 0.06 ng/mL (ref <0.03)
Troponin I: 0.05 ng/mL (ref <0.03)

## 2017-04-30 LAB — PROCALCITONIN
Procalcitonin: <0.1 ng/mL
Procalcitonin: <0.1 ng/mL

## 2017-04-30 LAB — LACTIC ACID, PLASMA: Lactic Acid, Venous: 2.1 mmol/L (ref 0.5–1.9)

## 2017-04-30 LAB — LACTATE DEHYDROGENASE: LDH: 182 U/L (ref 98–192)

## 2017-04-30 MED ORDER — POTASSIUM CHLORIDE CRYS ER 20 MEQ PO TBCR
40.0000 meq | EXTENDED_RELEASE_TABLET | Freq: Every day | ORAL | Status: DC
  Administered 2017-04-30 – 2017-05-01 (×2): 40 meq via ORAL
  Filled 2017-04-30 (×2): qty 2

## 2017-04-30 MED ORDER — LIDOCAINE HCL 1 % IJ SOLN
INTRAMUSCULAR | Status: AC
  Filled 2017-04-30: qty 20

## 2017-04-30 MED ORDER — IPRATROPIUM-ALBUTEROL 0.5-2.5 (3) MG/3ML IN SOLN
3.0000 mL | Freq: Four times a day (QID) | RESPIRATORY_TRACT | Status: DC
  Administered 2017-04-30 (×3): 3 mL via RESPIRATORY_TRACT
  Filled 2017-04-30 (×2): qty 3

## 2017-04-30 MED ORDER — FUROSEMIDE 10 MG/ML IJ SOLN
80.0000 mg | Freq: Two times a day (BID) | INTRAMUSCULAR | Status: DC
  Administered 2017-04-30 (×2): 80 mg via INTRAVENOUS
  Filled 2017-04-30 (×3): qty 8

## 2017-04-30 MED ORDER — MUSCLE RUB 10-15 % EX CREA
TOPICAL_CREAM | CUTANEOUS | Status: DC | PRN
  Filled 2017-04-30: qty 85

## 2017-04-30 MED ORDER — ORAL CARE MOUTH RINSE
15.0000 mL | Freq: Two times a day (BID) | OROMUCOSAL | Status: DC
  Administered 2017-04-30 – 2017-05-05 (×10): 15 mL via OROMUCOSAL

## 2017-04-30 NOTE — Progress Notes (Signed)
CRITICAL VALUE ALERT  Critical Value:  Lactic Acid 2.12  Date & Time Notied:  04/30/17 0051  Provider Notified: Isidore Moos  Orders Received/Actions taken: Paged

## 2017-04-30 NOTE — Progress Notes (Signed)
[facility]                                                                              Patient Demographics  Victoria Burke, is a 66 y.o. female, DOB - [birth year], MPN:[id number]  Admit date - 04/29/2017   Admitting Physician Vianne Bulls, MD  Outpatient Primary MD for the patient is Dorena Dew, FNP  Outpatient specialists:   LOS - 1  days   Medical records reviewed and are as summarized below:    Chief Complaint  Patient presents with  . Shortness of Breath       Brief summary    Patient is a 66 year old female with COPD, chronic diastolic CHF, paroxysmal A. fib and chronic kidney disease stage III, CAD presented with acute shortness of breath and respiratory distress.  In ED patient was afebrile, requiring 6 L of O2.  Creatinine at 1.49.  CT angiogram showed no PE, right pleural effusion, multifocal groundglass densities concerning for multifocal pneumonia.  The patient was placed on BiPAP in ED.    Assessment & Plan    Principal Problem:   Acute respiratory failure with hypoxia (HCC) -Multifactorial related acute CHF exacerbation, multifocal pneumonia, pleural effusion, anemia -BNP 1500, increased right-sided pleural effusion, chest x-ray with multifocal densities -Patient was placed on BiPAP, currently off today  -Continue IV antibiotics, IV steroids, IV diuresis, O2 -Thoracentesis planned today  Active Problems:   Chronic obstructive pulmonary disease (HCC) exacerbation, HCAP/multifocal pneumonia - BIPAP off this morning, no wheezing -Continue IV steroids, IV antibiotics, scheduled nebs, prn albuterol    Acute on chronic diastolic CHF (congestive heart failure) ([place]), elevated troponins, underlying history of CAD -Recent echo 10/1 with EF of 65-70% with grade 2 diastolic dysfunction, currently no chest pain -Continue IV daily, increase Lasix to 80 mg IV q12hrs,  -Strict I's and O's and daily weights -Replaced potassium - hold  amlodipine, losartan     Generalized anxiety disorder -Currently stable, cont buspar     Recurrent right pleural effusion -Right-sided thoracentesis today with labs, follow cultures      Chronic Kidney disease, chronic, stage III (GFR 30-59 ml/min) (HCC) -Baseline creatinine ~1.9 -Currently below baseline, will follow with diuresis    Paroxysmal atrial fibrillation (HCC) -Stable, continue amiodarone - CHADSvasc 5 not on anticoagulation due to poor compliance,   Code Status: full DVT Prophylaxis:  heparin  Family Communication: Discussed in detail with the patient, all imaging results, lab results explained to the patient   Disposition Plan:   Time Spent in minutes  25 minutes  Procedures:  Thoracentesis planned today  Consultants:   None 10/20/  Antimicrobials:   IV vancomycin 10/23   IV cefepime 10/23    Medications  Scheduled Meds: . amiodarone  200 mg Oral BID  . amLODipine  10 mg Oral Daily  . anastrozole  1 mg Oral Daily  . aspirin  324 mg Oral Once  . aspirin EC  81 mg Oral Daily  . busPIRone  10 mg Oral BID  . furosemide  80 mg Intravenous Q12H  . gabapentin  300 mg Oral TID  . heparin  5,000  Units Subcutaneous Q8H  . hydrALAZINE  100 mg Oral Q8H  . lidocaine      . losartan  25 mg Oral Daily  . mouth rinse  15 mL Mouth Rinse BID  . methylPREDNISolone (SOLU-MEDROL) injection  40 mg Intravenous Q12H  . potassium chloride  40 mEq Oral Daily  . sodium chloride flush  3 mL Intravenous Q12H  . tiotropium  18 mcg Inhalation Daily   Continuous Infusions: . sodium chloride    . ceFEPime (MAXIPIME) IV Stopped (04/30/17 0159)  . vancomycin Stopped (04/30/17 0114)   PRN Meds:.sodium chloride, albuterol, hydrALAZINE, sodium chloride flush, traZODone   Antibiotics   Anti-infectives    Start     Dose/Rate Route Frequency Ordered Stop   04/29/17 2315  vancomycin (VANCOCIN) IVPB 750 mg/150 ml premix     750 mg 150 mL/hr over 60 Minutes Intravenous Every  24 hours 04/29/17 2300     04/29/17 2300  ceFEPIme (MAXIPIME) 2 g in dextrose 5 % 50 mL IVPB     2 g 100 mL/hr over 30 Minutes Intravenous Every 24 hours 04/29/17 2243 05/07/17 2259   04/29/17 2245  cefTRIAXone (ROCEPHIN) 1 g in dextrose 5 % 50 mL IVPB  Status:  Discontinued     1 g 100 mL/hr over 30 Minutes Intravenous  Once 04/29/17 2232 04/29/17 2237   04/29/17 2245  azithromycin (ZITHROMAX) tablet 1,000 mg  Status:  Discontinued     1,000 mg Oral  Once 04/29/17 2232 04/29/17 2237        Subjective:   Victoria Burke was seen and examined today.  On BiPAP at the time of my encounter.  No chest pain, shortness of breath is improving. Patient denies dizziness, abdominal pain, N/V/D/C, new weakness, numbess, tingling. No acute events overnight.  No fevers   Objective:   Vitals:   04/30/17 0600 04/30/17 0643 04/30/17 0816 04/30/17 0900  BP: 126/75  131/68 114/81  Pulse: 65 65 65   Resp: (!) 27 (!) 24 (!) 22   Temp:      TempSrc:      SpO2: 91% 97% 97%   Weight:      Height:        Intake/Output Summary (Last 24 hours) at 04/30/17 1031 Last data filed at 04/29/17 2102  Gross per 24 hour  Intake           108.33 ml  Output                0 ml  Net           108.33 ml     Wt Readings from Last 3 Encounters:  04/29/17 51.9 kg (114 lb 6.7 oz)  04/09/17 55.4 kg (122 lb 2.2 oz)  03/30/17 51.7 kg (114 lb)     Exam  General: Alert and oriented x 3, on Bipap  Eyes:   HEENT:  Atraumatic, normocephalic, JVD +  Cardiovascular: S1 S2 auscultated, ireg ireg   Respiratory: dec BS at bases Rt >Lt   Gastrointestinal: Soft, nontender, nondistended, + bowel sounds  Ext: trace pedal edema bilaterally  Neuro: AAOx3, Cr N's II- XII. Strength 5/5 upper and lower extremities bilaterally, speech clear, sensations grossly intact  Musculoskeletal: No digital cyanosis, clubbing  Skin: No rashes  Psych: Normal affect and demeanor, alert and oriented x3    Data Reviewed:  I  have personally reviewed following labs and imaging studies  Micro Results Recent Results (from the past 240 hour(s))  MRSA  PCR Screening     Status: None   Collection Time: 04/29/17 11:00 PM  Result Value Ref Range Status   MRSA by PCR NEGATIVE NEGATIVE Final    Comment:        The GeneXpert MRSA Assay (FDA approved for NASAL specimens only), is one component of a comprehensive MRSA colonization surveillance program. It is not intended to diagnose MRSA infection nor to guide or monitor treatment for MRSA infections.     Radiology Reports Dg Chest 2 View  Result Date: 04/06/2017 CLINICAL DATA:  Shortness breath on exertion.  Long history of COPD. EXAM: CHEST  2 VIEW COMPARISON:  03/26/2017. FINDINGS: Stable enlargement of the cardiac silhouette. Diffuse increase in prominence of the interstitial markings with mild diffuse bilateral alveolar opacities. No significant change in a moderate-sized right pleural effusion. Mild increase in size of a smaller left pleural effusion. Atheromatous aortic calcifications. Left axillary surgical clips. Mild thoracic spine degenerative changes. IMPRESSION: 1. Worsening changes of congestive heart failure with interval alveolar edema superimposed on interstitial edema. 2. Mild increase in amount of left pleural fluid and stable right pleural fluid. 3. Stable cardiomegaly. Electronically Signed   By: Claudie Revering M.D.   On: 04/06/2017 11:13   Ct Angio Chest Pe W And/or Wo Contrast  Result Date: 04/29/2017 CLINICAL DATA:  Shortness of breath with abnormal biomarkers EXAM: CT ANGIOGRAPHY CHEST WITH CONTRAST TECHNIQUE: Multidetector CT imaging of the chest was performed using the standard protocol during bolus administration of intravenous contrast. Multiplanar CT image reconstructions and MIPs were obtained to evaluate the vascular anatomy. CONTRAST:  75 mL Isovue 370 intravenous COMPARISON:  Radiograph 04/29/2017, CT chest 02/04/2017, 10/15/2016 FINDINGS:  Cardiovascular: Technologist inadvertently did not completely image the bilateral lung bases. Satisfactory opacification of the pulmonary arterial system to the segmental level. No central embolus is visualized. Heavily calcified aorta without aneurysmal dilatation. Coronary artery calcification. Cardiomegaly. No large pericardial effusion. Mediastinum/Nodes: Midline trachea. No thyroid mass. Increased mediastinal adenopathy, for example in 18 mm lymph node adjacent to the aortic arch. 16 mm right precarinal lymph node. 16 mm lymph node above the aortic arch. Esophagus is within normal limits. Lungs/Pleura: Small left pleural effusion. Small moderate partially loculated right pleural effusion. Mild emphysema. Development of multifocal ground-glass densities within the upper lobes with worsened consolidation in the right lower lobe. Negative for a pneumothorax. Upper Abdomen: Inadequately visualized. Musculoskeletal: Patchy foci of sclerosis in the sternum and vertebral bodies. No change from prior. Review of the MIP images confirms the above findings. IMPRESSION: 1. Incompletely imaged lung bases. Allowing for this, no definite acute central embolus is visualized. 2. Bilateral pleural effusions, small on the left and small moderate on the right with mild loculation of the right pleural effusion. Right pleural effusion is increased compared to the most recent prior chest CT. Development of multifocal ground-glass densities within the upper lobes with progressed consolidation in the right lower lobe possibly due to multifocal pneumonia. Underlying emphysema. 3. Continued moderate mediastinal adenopathy, similar to most recent prior CT but progressed since 2017. 4. Cardiomegaly Aortic Atherosclerosis (ICD10-I70.0) and Emphysema (ICD10-J43.9). Electronically Signed   By: Donavan Foil M.D.   On: 04/29/2017 22:05   Dg Chest Port 1 View  Result Date: 04/29/2017 CLINICAL DATA:  Onset of shortness of breath today.  EXAM: PORTABLE CHEST 1 VIEW COMPARISON:  Single-view of the chest 04/09/2017. PA and lateral chest 04/06/2017 02/03/2017. CT chest 02/04/2017. FINDINGS: The lungs are emphysematous. Small to moderate pleural effusions, larger on the right,  persist without marked change. Associated basilar airspace disease is seen. There is interstitial pulmonary edema. Cardiomegaly noted. No pneumothorax. Atherosclerosis is noted. IMPRESSION: No marked change in pulmonary edema with right greater than left pleural effusions and basilar airspace disease since the most recent examination. Cardiomegaly. Atherosclerosis. Electronically Signed   By: Inge Rise M.D.   On: 04/29/2017 18:59   Dg Chest Port 1 View  Result Date: 04/09/2017 CLINICAL DATA:  Shortness of Breath EXAM: PORTABLE CHEST 1 VIEW COMPARISON:  04/06/2017 FINDINGS: Cardiac shadow remains enlarged. Aortic calcifications are again seen. Right pleural effusion and likely underlying atelectasis is identified although aeration is improved when compared with the prior exam. The degree of vascular congestion has also improved from the prior study. No bony abnormality is seen. IMPRESSION: Improved CHF when compare with the prior exam. Electronically Signed   By: Inez Catalina M.D.   On: 04/09/2017 12:10    Lab Data:  CBC:  Recent Labs Lab 04/29/17 2232 04/29/17 2356  WBC 8.7 7.4  NEUTROABS 8.4* 7.2  HGB 12.0 12.7  HCT 37.7 39.9  MCV 88.7 89.1  PLT 330 378   Basic Metabolic Panel:  Recent Labs Lab 04/29/17 1815 04/29/17 2356  NA 141 139  K 3.5 3.2*  CL 111 109  CO2 19* 19*  GLUCOSE 154* 225*  BUN 26* 30*  CREATININE 1.49* 1.74*  CALCIUM 9.0 8.8*   GFR: Estimated Creatinine Clearance: 25.5 mL/min (A) (by C-G formula based on SCr of 1.74 mg/dL (H)). Liver Function Tests:  Recent Labs Lab 04/29/17 1815  AST 20  ALT 12*  ALKPHOS 106  BILITOT 0.6  PROT 7.7  ALBUMIN 2.7*   No results for input(s): LIPASE, AMYLASE in the last 168  hours. No results for input(s): AMMONIA in the last 168 hours. Coagulation Profile: No results for input(s): INR, PROTIME in the last 168 hours. Cardiac Enzymes:  Recent Labs Lab 04/29/17 1815 04/29/17 2356 04/30/17 0740  TROPONINI 0.06* 0.06* 0.05*   BNP (last 3 results) No results for input(s): PROBNP in the last 8760 hours. HbA1C: No results for input(s): HGBA1C in the last 72 hours. CBG: No results for input(s): GLUCAP in the last 168 hours. Lipid Profile: No results for input(s): CHOL, HDL, LDLCALC, TRIG, CHOLHDL, LDLDIRECT in the last 72 hours. Thyroid Function Tests: No results for input(s): TSH, T4TOTAL, FREET4, T3FREE, THYROIDAB in the last 72 hours. Anemia Panel: No results for input(s): VITAMINB12, FOLATE, FERRITIN, TIBC, IRON, RETICCTPCT in the last 72 hours. Urine analysis:    Component Value Date/Time   COLORURINE YELLOW 03/23/2017 2057   APPEARANCEUR HAZY (A) 03/23/2017 2057   LABSPEC 1.013 03/23/2017 2057   PHURINE 5.0 03/23/2017 2057   GLUCOSEU NEGATIVE 03/23/2017 2057   HGBUR NEGATIVE 03/23/2017 2057   BILIRUBINUR NEGATIVE 03/23/2017 2057   KETONESUR NEGATIVE 03/23/2017 2057   PROTEINUR >=300 (A) 03/23/2017 2057   UROBILINOGEN 0.2 10/31/2016 1050   NITRITE NEGATIVE 03/23/2017 2057   LEUKOCYTESUR NEGATIVE 03/23/2017 2057     Dorissa Stinnette M.D. [facility] 04/30/2017, 10:31 AM  Pager: [phone] Between 7am to 7pm - call Pager - [phone]  After 7pm go to [email or web address] - password [email or web address]  Call night coverage person covering after 7pm

## 2017-04-30 NOTE — Care Management Note (Signed)
Case Management Note  Patient Details  Name: YOSHIKO KELEHER MRN: [id number] Date of Birth: 05/02/1951  Subjective/Objective:  From home with room mate, [name], presents with Resp failure with hypoxia, she is on bipap on/off, lasix, s/p thoracentesis in IR, conts on iv abx. Patient states she will probably need home oxygen, she states she did have [facility] before but her SSI is off , it is suppose to be reinstated but she has not had the chance to get to the DSS office.  Her preferred pharmacy is [facility] on [address] and she states she uses [address] [facility].  She will need a rollator at discharge.  She has a PCP and medication coverage.                 Action/Plan: NCM will follow for dc needs.   Expected Discharge Date:                  Expected Discharge Plan:  [place]  In-House Referral:     Discharge planning Services  CM Consult  Post Acute Care Choice:    Choice offered to:     DME Arranged:    DME Agency:     HH Arranged:    [place] Agency:     Status of Service:  In process, will continue to follow  If discussed at Long Length of Stay Meetings, dates discussed:    Additional Comments:  Zenon Mayo, RN 04/30/2017, 4:59 PM

## 2017-04-30 NOTE — Progress Notes (Signed)
Patient presented to radiology department for possible thoracentesis.  Currently on nasal cannula.   [place] Chest shows pleural effusion amenable to thoracentesis on the right, but only a small amount of fluid on the left.  Discussed procedure with patient who requests we call her son.  Son and mother both state they would like to wait to have procedure done.  They would like to see if fluid collection improves with current therapy.  Limited US Chest will be dictated separately.  Patient returned to unit.  Notified ordering MD.   Brynda Greathouse, MS RD PA-C 11:21 AM

## 2017-05-01 ENCOUNTER — Other Ambulatory Visit: Payer: Medicare Other

## 2017-05-01 ENCOUNTER — Ambulatory Visit: Payer: Medicare Other | Admitting: Nurse Practitioner

## 2017-05-01 ENCOUNTER — Inpatient Hospital Stay (HOSPITAL_COMMUNITY): Payer: Medicare Other

## 2017-05-01 LAB — PROCALCITONIN

## 2017-05-01 LAB — VANCOMYCIN, TROUGH: VANCOMYCIN TR: 14 ug/mL — AB (ref 15–20)

## 2017-05-01 LAB — CBC
HEMATOCRIT: 36 % (ref 36.0–46.0)
HEMOGLOBIN: 11.1 g/dL — AB (ref 12.0–15.0)
MCH: 27.7 pg (ref 26.0–34.0)
MCHC: 30.8 g/dL (ref 30.0–36.0)
MCV: 89.8 fL (ref 78.0–100.0)
Platelets: 361 10*3/uL (ref 150–400)
RBC: 4.01 MIL/uL (ref 3.87–5.11)
RDW: 16.4 % — ABNORMAL HIGH (ref 11.5–15.5)
WBC: 10.4 10*3/uL (ref 4.0–10.5)

## 2017-05-01 LAB — BASIC METABOLIC PANEL
ANION GAP: 12 (ref 5–15)
BUN: 46 mg/dL — ABNORMAL HIGH (ref 6–20)
CHLORIDE: 104 mmol/L (ref 101–111)
CO2: 20 mmol/L — AB (ref 22–32)
Calcium: 8.6 mg/dL — ABNORMAL LOW (ref 8.9–10.3)
Creatinine, Ser: 2.26 mg/dL — ABNORMAL HIGH (ref 0.44–1.00)
GFR calc non Af Amer: 22 mL/min — ABNORMAL LOW (ref >60)
GFR, EST AFRICAN AMERICAN: 25 mL/min — AB (ref >60)
Glucose, Bld: 199 mg/dL — ABNORMAL HIGH (ref 65–99)
POTASSIUM: 4.3 mmol/L (ref 3.5–5.1)
SODIUM: 136 mmol/L (ref 135–145)

## 2017-05-01 LAB — GLUCOSE, CAPILLARY: GLUCOSE-CAPILLARY: 208 mg/dL — AB (ref 65–99)

## 2017-05-01 MED ORDER — FUROSEMIDE 10 MG/ML IJ SOLN
40.0000 mg | Freq: Two times a day (BID) | INTRAMUSCULAR | Status: DC
  Administered 2017-05-01: 40 mg via INTRAVENOUS
  Filled 2017-05-01: qty 4

## 2017-05-01 MED ORDER — IPRATROPIUM-ALBUTEROL 0.5-2.5 (3) MG/3ML IN SOLN
3.0000 mL | Freq: Three times a day (TID) | RESPIRATORY_TRACT | Status: DC
  Administered 2017-05-01 – 2017-05-05 (×14): 3 mL via RESPIRATORY_TRACT
  Filled 2017-05-01 (×15): qty 3

## 2017-05-01 MED ORDER — DEXTROSE 5 % IV SOLN
1.0000 g | INTRAVENOUS | Status: DC
  Administered 2017-05-01 – 2017-05-05 (×4): 1 g via INTRAVENOUS
  Filled 2017-05-01 (×4): qty 1

## 2017-05-01 MED ORDER — SODIUM CHLORIDE 0.9 % IV SOLN: 500.0000 mg | INTRAVENOUS | Status: DC

## 2017-05-01 NOTE — Progress Notes (Signed)
IR called to unit for possible thoracentesis today.  RN states patient is waiting for family to arrive prior to giving consent for procedure.  Her family is expected tomorrow (10/26).   Brynda Greathouse, MS RD PA-C 1:46 PM

## 2017-05-01 NOTE — Progress Notes (Signed)
Pharmacy Antibiotic Note  Victoria Burke is a 66 y.o. female admitted on 04/29/2017 with pneumonia.  Pharmacy has been consulted for Vancomycin dosing. Due to rising Scr, vancomycin level was checked prior to 3rd dose. Vancomycin level is 14 tonight.   Plan: -Given rising Scr, will empirically decrease dose to 500 mg IV q24h -Re-check vancomycin level as indicated   Height: 5\' 2"  (157.5 cm) Weight: 122 lb 5.7 oz (55.5 kg) IBW/kg (Calculated) : 50.1  Temp (24hrs), Avg:97.9 F (36.6 C), Min:97.5 F (36.4 C), Max:98.6 F (37 C)   Recent Labs Lab 04/29/17 1815 04/29/17 2232 04/29/17 2233 04/29/17 2356 05/01/17 0703 05/01/17 2204  WBC  --  8.7  --  7.4 10.4  --   CREATININE 1.49*  --   --  1.74* 2.26*  --   LATICACIDVEN  --   --  1.1 2.1*  --   --   VANCOTROUGH  --   --   --   --   --  14*    Estimated Creatinine Clearance: 19.6 mL/min (A) (by C-G formula based on SCr of 2.26 mg/dL (H)).    No Known Allergies   Victoria Burke 05/01/2017 11:45 PM

## 2017-05-01 NOTE — Progress Notes (Signed)
PHARMACY NOTE:  ANTIMICROBIAL RENAL DOSAGE ADJUSTMENT  Current antimicrobial regimen includes a mismatch between antimicrobial dosage and estimated renal function.  As per policy approved by the Pharmacy & Therapeutics and Medical Executive Committees, the antimicrobial dosage will be adjusted accordingly.  Current antimicrobial dosage: cefepime 2 g q24h  Indication: HCAP  Renal Function:  Estimated Creatinine Clearance: 19.6 mL/min (A) (by C-G formula based on SCr of 2.26 mg/dL (H)). []      On intermittent HD, scheduled: []      On CRRT    Antimicrobial dosage has been changed to:  Cefepime 1 g q24  Victoria Burke, PharmD, BCPS, BCCCP Clinical Pharmacist Clinical phone for 05/01/2017 from 7a-3:30p: [phone] If after 3:30p, please call main pharmacy at: [phone] 05/01/2017 10:54 AM

## 2017-05-01 NOTE — Plan of Care (Signed)
Problem: Activity: Goal: Risk for activity intolerance will decrease Outcome: Progressing Patient needs to have extra time with activity and remain on oxygen  Problem: Respiratory: Goal: Levels of oxygenation will improve Outcome: Progressing Patients oxygen saturation decrease when not using supplemental oxygen

## 2017-05-01 NOTE — Plan of Care (Signed)
Problem: Pain Managment: Goal: General experience of comfort will improve Outcome: Progressing Discussed plan of care for the evening, pain management and medications with some teach back displayed

## 2017-05-01 NOTE — Progress Notes (Signed)
[facility]                                                                              Patient Demographics  Victoria Burke, is a 66 y.o. female, DOB - 25-Oct-1950, CBS:[id number]  Admit date - 04/29/2017   Admitting Physician Vianne Bulls, MD  Outpatient Primary MD for the patient is Dorena Dew, FNP  Outpatient specialists:   LOS - 2  days   Medical records reviewed and are as summarized below:    Chief Complaint  Patient presents with  . Shortness of Breath       Brief summary    Patient is a 66 year old female with COPD, chronic diastolic CHF, paroxysmal A. fib and chronic kidney disease stage III, CAD presented with acute shortness of breath and respiratory distress.  In ED patient was afebrile, requiring 6 L of O2.  Creatinine at 1.49.  CT angiogram showed no PE, right pleural effusion, multifocal groundglass densities concerning for multifocal pneumonia.  The patient was placed on BiPAP in ED.    Assessment & Plan    Principal Problem:   Acute respiratory failure with hypoxia (HCC) -Multifactorial related acute CHF exacerbation, multifocal pneumonia, pleural effusion, anemia -BNP 1500, increased right-sided pleural effusion, chest x-ray with multifocal densities -Much improved today, off BiPAP since yesterday, on 2 L O2 via nasal cannula -Repeat chest xray showed CHF with mild improvement in the pulmonary edema but unchanged right sided pleural effusion  -Continue IV Lasix, decrease dose due to creatinine trending up  Active Problems:   Chronic obstructive pulmonary disease (HCC) exacerbation, HCAP/multifocal pneumonia - BIPAP off this morning, no wheezing -Continue IV steroids, IV antibiotics, scheduled nebs, prn albuterol    Acute on chronic diastolic CHF (congestive heart failure) ([place]), elevated troponins, underlying history of CAD -Recent echo 10/1 with EF of 65-70% with grade 2 diastolic dysfunction, currently no chest  pain -Continue IV Lasix, decrease dose to 40 mg every 12 hours as creatinine is trending up -Strict I's and O's and daily weights - hold amlodipine, losartan     Generalized anxiety disorder -Currently stable, cont buspar     Recurrent right pleural effusion -Patient refused thoracentesis yesterday.   - Repeat chest x-ray showed improving pulmonary edema however right-sided pleural effusion unchanged, recommended thoracentesis to the patient and family.  Patient is agreeing today.    Chronic Kidney disease, chronic, stage III (GFR 30-59 ml/min) (HCC) -Baseline creatinine ~1.9 -Creatinine trending up with diuresis.  Decrease Lasix    Paroxysmal atrial fibrillation (HCC) -Stable, continue amiodarone - CHADSvasc 5 not on anticoagulation due to poor compliance,   Code Status: full DVT Prophylaxis:  heparin  Family Communication: Discussed in detail with the patient, all imaging results, lab results explained to the patient, daughter, son at the bedside and 1 son on the phone   Disposition Plan:   Time Spent in minutes  25 minutes  Procedures:    Consultants:   None   Antimicrobials:   IV vancomycin 10/23   IV cefepime 10/23    Medications  Scheduled Meds: . amiodarone  200 mg Oral BID  . anastrozole  1 mg Oral Daily  . aspirin  324 mg Oral Once  . aspirin EC  81 mg Oral Daily  . busPIRone  10 mg Oral BID  . furosemide  80 mg Intravenous Q12H  . gabapentin  300 mg Oral TID  . heparin  5,000 Units Subcutaneous Q8H  . hydrALAZINE  100 mg Oral Q8H  . ipratropium-albuterol  3 mL Nebulization TID  . mouth rinse  15 mL Mouth Rinse BID  . methylPREDNISolone (SOLU-MEDROL) injection  40 mg Intravenous Q12H  . potassium chloride  40 mEq Oral Daily  . sodium chloride flush  3 mL Intravenous Q12H   Continuous Infusions: . sodium chloride    . ceFEPime (MAXIPIME) IV    . vancomycin Stopped (05/01/17 0031)   PRN Meds:.sodium chloride, albuterol, hydrALAZINE, MUSCLE RUB,  sodium chloride flush, traZODone   Antibiotics   Anti-infectives    Start     Dose/Rate Route Frequency Ordered Stop   05/01/17 2300  ceFEPIme (MAXIPIME) 1 g in dextrose 5 % 50 mL IVPB     1 g 100 mL/hr over 30 Minutes Intravenous Every 24 hours 05/01/17 1053 05/07/17 2259   04/29/17 2315  vancomycin (VANCOCIN) IVPB 750 mg/150 ml premix     750 mg 150 mL/hr over 60 Minutes Intravenous Every 24 hours 04/29/17 2300     04/29/17 2300  ceFEPIme (MAXIPIME) 2 g in dextrose 5 % 50 mL IVPB  Status:  Discontinued     2 g 100 mL/hr over 30 Minutes Intravenous Every 24 hours 04/29/17 2243 05/01/17 1053   04/29/17 2245  cefTRIAXone (ROCEPHIN) 1 g in dextrose 5 % 50 mL IVPB  Status:  Discontinued     1 g 100 mL/hr over 30 Minutes Intravenous  Once 04/29/17 2232 04/29/17 2237   04/29/17 2245  azithromycin (ZITHROMAX) tablet 1,000 mg  Status:  Discontinued     1,000 mg Oral  Once 04/29/17 2232 04/29/17 2237        Subjective:   Victoria Burke was seen and examined today.  Improving, O2 sat is 98% on 2 L, family at the bedside.  No chest pain.  No fevers or chills.  Shortness of breath is improving.  patient denies dizziness, abdominal pain, N/V/D/C, new weakness, numbess, tingling. No acute events overnight.     Objective:   Vitals:   05/01/17 0758 05/01/17 0800 05/01/17 0857 05/01/17 0858  BP: 120/68 120/68 120/68   Pulse: 62 62 64   Resp: 18 18 20    Temp: (!) 97.5 F (36.4 C)     TempSrc: Axillary     SpO2: 99% 98% 98% 98%  Weight:      Height:        Intake/Output Summary (Last 24 hours) at 05/01/17 1203 Last data filed at 05/01/17 0800  Gross per 24 hour  Intake              540 ml  Output                1 ml  Net              539 ml     Wt Readings from Last 3 Encounters:  05/01/17 55.5 kg (122 lb 5.7 oz)  04/09/17 55.4 kg (122 lb 2.2 oz)  03/30/17 51.7 kg (114 lb)     Exam  General: Alert and oriented x 3, NAD  Eyes: PERRLA, EOMI  HEENT:  Atraumatic,  normocephalic  Cardiovascular: S1 S2 auscultated, no rubs,  murmurs or gallops. Regular rate and rhythm. No pedal edema b/l  Respiratory: Decreased breath sound at the bases  Gastrointestinal: Soft, nontender, nondistended, + bowel sounds  Ext: no pedal edema bilaterally  Neuro: moving all 4 extremities  Musculoskeletal: No digital cyanosis, clubbing  Skin: No rashes  Psych: Normal affect and demeanor, alert and oriented x3    Data Reviewed:  I have personally reviewed following labs and imaging studies  Micro Results Recent Results (from the past 240 hour(s))  MRSA PCR Screening     Status: None   Collection Time: 04/29/17 11:00 PM  Result Value Ref Range Status   MRSA by PCR NEGATIVE NEGATIVE Final    Comment:        The GeneXpert MRSA Assay (FDA approved for NASAL specimens only), is one component of a comprehensive MRSA colonization surveillance program. It is not intended to diagnose MRSA infection nor to guide or monitor treatment for MRSA infections.     Radiology Reports Dg Chest 2 View  Result Date: 05/01/2017 CLINICAL DATA:  Pleural effusion short of breath. History of breast cancer EXAM: CHEST  2 VIEW COMPARISON:  04/29/2017 FINDINGS: Cardiac enlargement. Mild improvement in vascular congestion and edema. Moderate right effusion unchanged. Small left effusion unchanged. Bibasilar atelectasis improved on the right. Atherosclerotic aorta IMPRESSION: Congestive heart failure with mild improvement pulmonary edema. Electronically Signed   By: Franchot Gallo M.D.   On: 05/01/2017 11:01   Dg Chest 2 View  Result Date: 04/06/2017 CLINICAL DATA:  Shortness breath on exertion.  Long history of COPD. EXAM: CHEST  2 VIEW COMPARISON:  03/26/2017. FINDINGS: Stable enlargement of the cardiac silhouette. Diffuse increase in prominence of the interstitial markings with mild diffuse bilateral alveolar opacities. No significant change in a moderate-sized right pleural effusion.  Mild increase in size of a smaller left pleural effusion. Atheromatous aortic calcifications. Left axillary surgical clips. Mild thoracic spine degenerative changes. IMPRESSION: 1. Worsening changes of congestive heart failure with interval alveolar edema superimposed on interstitial edema. 2. Mild increase in amount of left pleural fluid and stable right pleural fluid. 3. Stable cardiomegaly. Electronically Signed   By: Claudie Revering M.D.   On: 04/06/2017 11:13   Ct Angio Chest Pe W And/or Wo Contrast  Result Date: 04/29/2017 CLINICAL DATA:  Shortness of breath with abnormal biomarkers EXAM: CT ANGIOGRAPHY CHEST WITH CONTRAST TECHNIQUE: Multidetector CT imaging of the chest was performed using the standard protocol during bolus administration of intravenous contrast. Multiplanar CT image reconstructions and MIPs were obtained to evaluate the vascular anatomy. CONTRAST:  75 mL Isovue 370 intravenous COMPARISON:  Radiograph 04/29/2017, CT chest 02/04/2017, 10/15/2016 FINDINGS: Cardiovascular: Technologist inadvertently did not completely image the bilateral lung bases. Satisfactory opacification of the pulmonary arterial system to the segmental level. No central embolus is visualized. Heavily calcified aorta without aneurysmal dilatation. Coronary artery calcification. Cardiomegaly. No large pericardial effusion. Mediastinum/Nodes: Midline trachea. No thyroid mass. Increased mediastinal adenopathy, for example in 18 mm lymph node adjacent to the aortic arch. 16 mm right precarinal lymph node. 16 mm lymph node above the aortic arch. Esophagus is within normal limits. Lungs/Pleura: Small left pleural effusion. Small moderate partially loculated right pleural effusion. Mild emphysema. Development of multifocal ground-glass densities within the upper lobes with worsened consolidation in the right lower lobe. Negative for a pneumothorax. Upper Abdomen: Inadequately visualized. Musculoskeletal: Patchy foci of sclerosis  in the sternum and vertebral bodies. No change from prior. Review of the MIP images confirms the above findings. IMPRESSION: 1.  Incompletely imaged lung bases. Allowing for this, no definite acute central embolus is visualized. 2. Bilateral pleural effusions, small on the left and small moderate on the right with mild loculation of the right pleural effusion. Right pleural effusion is increased compared to the most recent prior chest CT. Development of multifocal ground-glass densities within the upper lobes with progressed consolidation in the right lower lobe possibly due to multifocal pneumonia. Underlying emphysema. 3. Continued moderate mediastinal adenopathy, similar to most recent prior CT but progressed since 2017. 4. Cardiomegaly Aortic Atherosclerosis (ICD10-I70.0) and Emphysema (ICD10-J43.9). Electronically Signed   By: Donavan Foil M.D.   On: 04/29/2017 22:05   Dg Chest Port 1 View  Result Date: 04/29/2017 CLINICAL DATA:  Onset of shortness of breath today. EXAM: PORTABLE CHEST 1 VIEW COMPARISON:  Single-view of the chest 04/09/2017. PA and lateral chest 04/06/2017 02/03/2017. CT chest 02/04/2017. FINDINGS: The lungs are emphysematous. Small to moderate pleural effusions, larger on the right, persist without marked change. Associated basilar airspace disease is seen. There is interstitial pulmonary edema. Cardiomegaly noted. No pneumothorax. Atherosclerosis is noted. IMPRESSION: No marked change in pulmonary edema with right greater than left pleural effusions and basilar airspace disease since the most recent examination. Cardiomegaly. Atherosclerosis. Electronically Signed   By: Inge Rise M.D.   On: 04/29/2017 18:59   Dg Chest Port 1 View  Result Date: 04/09/2017 CLINICAL DATA:  Shortness of Breath EXAM: PORTABLE CHEST 1 VIEW COMPARISON:  04/06/2017 FINDINGS: Cardiac shadow remains enlarged. Aortic calcifications are again seen. Right pleural effusion and likely underlying atelectasis  is identified although aeration is improved when compared with the prior exam. The degree of vascular congestion has also improved from the prior study. No bony abnormality is seen. IMPRESSION: Improved CHF when compare with the prior exam. Electronically Signed   By: Inez Catalina M.D.   On: 04/09/2017 12:10   Ir US Chest  Result Date: 04/30/2017 CLINICAL DATA:  PLEURAL EFFUSIONS, PNEUMONIA EXAM: CHEST ULTRASOUND COMPARISON:  04/29/2017 CT FINDINGS: limited ultrasound of the chest demonstrates a large right pleural effusion and a very small left pleural effusion. this correlates with the previous chest CT. After discussion with the patient, she wishes to not have the procedure at this time. IMPRESSION: Right greater than left pleural effusions. Right effusion is amenable to thoracentesis. See above comment. Procedure not performed. Electronically Signed   By: Jerilynn Mages.  Shick M.D.   On: 04/30/2017 12:57    Lab Data:  CBC:  Recent Labs Lab 04/29/17 2232 04/29/17 2356 05/01/17 0703  WBC 8.7 7.4 10.4  NEUTROABS 8.4* 7.2  --   HGB 12.0 12.7 11.1*  HCT 37.7 39.9 36.0  MCV 88.7 89.1 89.8  PLT 330 323 952   Basic Metabolic Panel:  Recent Labs Lab 04/29/17 1815 04/29/17 2356 05/01/17 0703  NA 141 139 136  K 3.5 3.2* 4.3  CL 111 109 104  CO2 19* 19* 20*  GLUCOSE 154* 225* 199*  BUN 26* 30* 46*  CREATININE 1.49* 1.74* 2.26*  CALCIUM 9.0 8.8* 8.6*   GFR: Estimated Creatinine Clearance: 19.6 mL/min (A) (by C-G formula based on SCr of 2.26 mg/dL (H)). Liver Function Tests:  Recent Labs Lab 04/29/17 1815  AST 20  ALT 12*  ALKPHOS 106  BILITOT 0.6  PROT 7.7  ALBUMIN 2.7*   No results for input(s): LIPASE, AMYLASE in the last 168 hours. No results for input(s): AMMONIA in the last 168 hours. Coagulation Profile: No results for input(s): INR, PROTIME in the  last 168 hours. Cardiac Enzymes:  Recent Labs Lab 04/29/17 1815 04/29/17 2356 04/30/17 0740 04/30/17 1402  TROPONINI  0.06* 0.06* 0.05* 0.04*   BNP (last 3 results) No results for input(s): PROBNP in the last 8760 hours. HbA1C: No results for input(s): HGBA1C in the last 72 hours. CBG: No results for input(s): GLUCAP in the last 168 hours. Lipid Profile: No results for input(s): CHOL, HDL, LDLCALC, TRIG, CHOLHDL, LDLDIRECT in the last 72 hours. Thyroid Function Tests: No results for input(s): TSH, T4TOTAL, FREET4, T3FREE, THYROIDAB in the last 72 hours. Anemia Panel: No results for input(s): VITAMINB12, FOLATE, FERRITIN, TIBC, IRON, RETICCTPCT in the last 72 hours. Urine analysis:    Component Value Date/Time   COLORURINE YELLOW 03/23/2017 2057   APPEARANCEUR HAZY (A) 03/23/2017 2057   LABSPEC 1.013 03/23/2017 2057   PHURINE 5.0 03/23/2017 2057   GLUCOSEU NEGATIVE 03/23/2017 2057   HGBUR NEGATIVE 03/23/2017 2057   BILIRUBINUR NEGATIVE 03/23/2017 2057   KETONESUR NEGATIVE 03/23/2017 2057   PROTEINUR >=300 (A) 03/23/2017 2057   UROBILINOGEN 0.2 10/31/2016 1050   NITRITE NEGATIVE 03/23/2017 2057   LEUKOCYTESUR NEGATIVE 03/23/2017 2057     Kalandra Masters M.D. [facility] 05/01/2017, 12:03 PM  Pager: [phone] Between 7am to 7pm - call Pager - [phone]  After 7pm go to [email or web address] - password [email or web address]  Call night coverage person covering after 7pm

## 2017-05-02 ENCOUNTER — Inpatient Hospital Stay (HOSPITAL_COMMUNITY): Payer: Medicare Other

## 2017-05-02 HISTORY — PX: IR THORACENTESIS ASP PLEURAL SPACE W/IMG GUIDE: IMG5380

## 2017-05-02 LAB — CBC
HCT: 38.6 % (ref 36.0–46.0)
Hemoglobin: 11.8 g/dL — ABNORMAL LOW (ref 12.0–15.0)
MCH: 27.6 pg (ref 26.0–34.0)
MCHC: 30.6 g/dL (ref 30.0–36.0)
MCV: 90.4 fL (ref 78.0–100.0)
PLATELETS: 397 10*3/uL (ref 150–400)
RBC: 4.27 MIL/uL (ref 3.87–5.11)
RDW: 16.6 % — AB (ref 11.5–15.5)
WBC: 11.9 10*3/uL — ABNORMAL HIGH (ref 4.0–10.5)

## 2017-05-02 LAB — BASIC METABOLIC PANEL
Anion gap: 8 (ref 5–15)
BUN: 59 mg/dL — AB (ref 6–20)
CALCIUM: 9.1 mg/dL (ref 8.9–10.3)
CO2: 22 mmol/L (ref 22–32)
CREATININE: 2.66 mg/dL — AB (ref 0.44–1.00)
Chloride: 104 mmol/L (ref 101–111)
GFR calc Af Amer: 21 mL/min — ABNORMAL LOW (ref >60)
GFR, EST NON AFRICAN AMERICAN: 18 mL/min — AB (ref >60)
GLUCOSE: 232 mg/dL — AB (ref 65–99)
POTASSIUM: 5.2 mmol/L — AB (ref 3.5–5.1)
SODIUM: 134 mmol/L — AB (ref 135–145)

## 2017-05-02 LAB — GLUCOSE, PLEURAL OR PERITONEAL FLUID: Glucose, Fluid: 269 mg/dL

## 2017-05-02 LAB — GRAM STAIN

## 2017-05-02 LAB — BODY FLUID CELL COUNT WITH DIFFERENTIAL
LYMPHS FL: 50 %
MONOCYTE-MACROPHAGE-SEROUS FLUID: 37 % — AB (ref 50–90)
NEUTROPHIL FLUID: 13 % (ref 0–25)
Total Nucleated Cell Count, Fluid: 264 cu mm (ref 0–1000)

## 2017-05-02 LAB — LACTATE DEHYDROGENASE, PLEURAL OR PERITONEAL FLUID: LD FL: 63 U/L — AB (ref 3–23)

## 2017-05-02 MED ORDER — LIDOCAINE 2% (20 MG/ML) 5 ML SYRINGE
INTRAMUSCULAR | Status: AC
  Filled 2017-05-02: qty 10

## 2017-05-02 MED ORDER — LIDOCAINE HCL (PF) 2 % IJ SOLN
INTRAMUSCULAR | Status: DC | PRN
  Administered 2017-05-02: 10 mL

## 2017-05-02 NOTE — Care Management Important Message (Signed)
Important Message  Patient Details  Name: Victoria Burke MRN: [id number] Date of Birth: 1950-11-21   Medicare Important Message Given:  Yes    Nathen May 05/02/2017, 9:50 AM

## 2017-05-02 NOTE — Progress Notes (Signed)
Pt taken to IR for thoracentesis by IR staff.

## 2017-05-02 NOTE — Plan of Care (Signed)
Problem: Coping: Goal: Ability to identify and develop effective coping behavior will improve Outcome: Progressing Discussed with patient plan of care for the evening with the topic of trying to stay off of the bipap tonight and slightly touched on smoking cessation with some teach back displayed.

## 2017-05-02 NOTE — Progress Notes (Signed)
Pt stated she wanted to come off bipap for the rest of the night. RT will continue to monitor pt.

## 2017-05-02 NOTE — Progress Notes (Signed)
Per nursing report and RT notes, pt wore bipap last night till 0330/0400.  MD made aware and states that pt said she didn't wear it. Informed of nursing report.  Orders rec'd to cancel transfer.  States needs pt off bipap x 24 hrs in order to transfer.

## 2017-05-02 NOTE — Progress Notes (Signed)
[facility]                                                                              Patient Demographics  Victoria Burke, is a 66 y.o. female, DOB - 1950-09-23, WVP:[id number]  Admit date - 04/29/2017   Admitting Physician Vianne Bulls, MD  Outpatient Primary MD for the patient is Dorena Dew, FNP  Outpatient specialists:   LOS - 3  days   Medical records reviewed and are as summarized below:    Chief Complaint  Patient presents with  . Shortness of Breath       Brief summary    Patient is a 66 year old female with COPD, chronic diastolic CHF, paroxysmal A. fib and chronic kidney disease stage III, CAD presented with acute shortness of breath and respiratory distress.  In ED patient was afebrile, requiring 6 L of O2.  Creatinine at 1.49.  CT angiogram showed no PE, right pleural effusion, multifocal groundglass densities concerning for multifocal pneumonia.  The patient was placed on BiPAP in ED.    Assessment & Plan    Principal Problem:   Acute respiratory failure with hypoxia (HCC) - Multifactorial related acute CHF exacerbation, multifocal pneumonia, pleural effusion, anemia - BNP 1500, increased right-sided pleural effusion, chest x-ray with multifocal densities - Repeat chest xray on 10/25 showed CHF with mild improvement in the pulmonary edema but unchanged right sided pleural effusion  -Discussed with the patient and family in detail, recommended thoracentesis for the right-sided pleural effusion.  Patient agreeable now.  IR consult placed for Thoracentesis  Active Problems:   Chronic obstructive pulmonary disease (HCC) exacerbation, HCAP/multifocal pneumonia -BiPAP off, no wheezing, -Continue IV antibiotics, IV steroids, duo nebs -DC vancomycin, continue cefepime for now    Acute on chronic diastolic CHF (congestive heart failure) ([place]), elevated troponins, underlying history of CAD -Recent echo 10/1 with EF of 65-70% with grade 2  diastolic dysfunction, currently no chest pain -Creatinine trending up, hold Lasix today -IR to attempt right-sided thoracentesis -Continue to hold amlodipine, losartan    Generalized anxiety disorder -Currently stable, cont buspar     Recurrent right pleural effusion - Repeat chest x-ray showed improving pulmonary edema however right-sided pleural effusion unchanged, recommended thoracentesis to the patient and family.   -Patient is now agreeable for thoracentesis, IR to attempted    Chronic Kidney disease, chronic, stage III (GFR 30-59 ml/min) (HCC) -Baseline creatinine ~1.9 -Creatinine trending up, hold Lasix today    Paroxysmal atrial fibrillation (HCC) -Stable, continue amiodarone - CHADSvasc 5 not on anticoagulation due to poor compliance,   Code Status: full DVT Prophylaxis:  heparin  Family Communication: Discussed in detail with the patient, all imaging results, lab results explained to the patient.  Discussed with the family on 10/25   Disposition Plan: Transfer to tele floor   Time Spent in minutes  25 minutes  Procedures:    Consultants:   IR   Antimicrobials:   IV vancomycin 10/23 > 10/26   IV cefepime 10/23    Medications  Scheduled Meds: . amiodarone  200 mg Oral BID  . anastrozole  1 mg Oral Daily  .  aspirin  324 mg Oral Once  . aspirin EC  81 mg Oral Daily  . busPIRone  10 mg Oral BID  . gabapentin  300 mg Oral TID  . heparin  5,000 Units Subcutaneous Q8H  . hydrALAZINE  100 mg Oral Q8H  . ipratropium-albuterol  3 mL Nebulization TID  . mouth rinse  15 mL Mouth Rinse BID  . methylPREDNISolone (SOLU-MEDROL) injection  40 mg Intravenous Q12H  . sodium chloride flush  3 mL Intravenous Q12H   Continuous Infusions: . sodium chloride 250 mL (05/01/17 2145)  . ceFEPime (MAXIPIME) IV Stopped (05/01/17 2321)   PRN Meds:.sodium chloride, albuterol, hydrALAZINE, MUSCLE RUB, sodium chloride flush, traZODone   Antibiotics   Anti-infectives     Start     Dose/Rate Route Frequency Ordered Stop   05/02/17 2315  vancomycin (VANCOCIN) 500 mg in sodium chloride 0.9 % 100 mL IVPB  Status:  Discontinued     500 mg 100 mL/hr over 60 Minutes Intravenous Every 24 hours 05/01/17 2348 05/02/17 0754   05/01/17 2300  ceFEPIme (MAXIPIME) 1 g in dextrose 5 % 50 mL IVPB     1 g 100 mL/hr over 30 Minutes Intravenous Every 24 hours 05/01/17 1053 05/07/17 2259   04/29/17 2315  vancomycin (VANCOCIN) IVPB 750 mg/150 ml premix  Status:  Discontinued     750 mg 150 mL/hr over 60 Minutes Intravenous Every 24 hours 04/29/17 2300 05/01/17 2348   04/29/17 2300  ceFEPIme (MAXIPIME) 2 g in dextrose 5 % 50 mL IVPB  Status:  Discontinued     2 g 100 mL/hr over 30 Minutes Intravenous Every 24 hours 04/29/17 2243 05/01/17 1053   04/29/17 2245  cefTRIAXone (ROCEPHIN) 1 g in dextrose 5 % 50 mL IVPB  Status:  Discontinued     1 g 100 mL/hr over 30 Minutes Intravenous  Once 04/29/17 2232 04/29/17 2237   04/29/17 2245  azithromycin (ZITHROMAX) tablet 1,000 mg  Status:  Discontinued     1,000 mg Oral  Once 04/29/17 2232 04/29/17 2237        Subjective:   Victoria Burke was seen and examined today.  Denies any specific complaints, overall improving slowly, O2 sats 99% on 2 L.  No chest pain.  No fevers.  Patient denies dizziness, abdominal pain, N/V/D/C, new weakness, numbess, tingling. No acute events overnight.     Objective:   Vitals:   05/02/17 0500 05/02/17 0719 05/02/17 0754 05/02/17 0755  BP: (!) 149/73 (!) 149/78 (!) 149/78   Pulse: 62 (!) 42 63   Resp: 19  20   Temp:  97.9 F (36.6 C)    TempSrc:  Oral    SpO2: 98%  99% 99%  Weight:      Height:        Intake/Output Summary (Last 24 hours) at 05/02/17 1126 Last data filed at 05/02/17 0600  Gross per 24 hour  Intake           1402.5 ml  Output                0 ml  Net           1402.5 ml     Wt Readings from Last 3 Encounters:  05/02/17 54.9 kg (121 lb 0.5 oz)  04/09/17 55.4 kg (122 lb  2.2 oz)  03/30/17 51.7 kg (114 lb)     Exam   General: Alert and oriented x 3, NAD  Eyes:   HEENT:  Atraumatic,  normocephalic  Cardiovascular: S1 S2 auscultated, Regular rate and rhythm. No pedal edema b/l  Respiratory: Clear to auscultation bilaterally, no wheezing, rales or rhonchi  Gastrointestinal: Soft, nontender, nondistended, + bowel sounds  Ext: no pedal edema bilaterally  Neuro: no neuro deficits  Musculoskeletal: No digital cyanosis, clubbing  Skin: No rashes  Psych: Normal affect and demeanor, alert and oriented x3    Data Reviewed:  I have personally reviewed following labs and imaging studies  Micro Results Recent Results (from the past 240 hour(s))  MRSA PCR Screening     Status: None   Collection Time: 04/29/17 11:00 PM  Result Value Ref Range Status   MRSA by PCR NEGATIVE NEGATIVE Final    Comment:        The GeneXpert MRSA Assay (FDA approved for NASAL specimens only), is one component of a comprehensive MRSA colonization surveillance program. It is not intended to diagnose MRSA infection nor to guide or monitor treatment for MRSA infections.   Culture, blood (routine x 2) Call MD if unable to obtain prior to antibiotics being given     Status: None (Preliminary result)   Collection Time: 04/29/17 11:25 PM  Result Value Ref Range Status   Specimen Description BLOOD RIGHT HAND  Final   Special Requests   Final    BOTTLES DRAWN AEROBIC AND ANAEROBIC Blood Culture adequate volume   Culture NO GROWTH 2 DAYS  Final   Report Status PENDING  Incomplete  Culture, blood (routine x 2) Call MD if unable to obtain prior to antibiotics being given     Status: None (Preliminary result)   Collection Time: 04/29/17 11:32 PM  Result Value Ref Range Status   Specimen Description BLOOD LEFT HAND  Final   Special Requests   Final    BOTTLES DRAWN AEROBIC AND ANAEROBIC Blood Culture results may not be optimal due to an excessive volume of blood received in  culture bottles   Culture NO GROWTH 2 DAYS  Final   Report Status PENDING  Incomplete    Radiology Reports Dg Chest 2 View  Result Date: 05/01/2017 CLINICAL DATA:  Pleural effusion short of breath. History of breast cancer EXAM: CHEST  2 VIEW COMPARISON:  04/29/2017 FINDINGS: Cardiac enlargement. Mild improvement in vascular congestion and edema. Moderate right effusion unchanged. Small left effusion unchanged. Bibasilar atelectasis improved on the right. Atherosclerotic aorta IMPRESSION: Congestive heart failure with mild improvement pulmonary edema. Electronically Signed   By: Franchot Gallo M.D.   On: 05/01/2017 11:01   Dg Chest 2 View  Result Date: 04/06/2017 CLINICAL DATA:  Shortness breath on exertion.  Long history of COPD. EXAM: CHEST  2 VIEW COMPARISON:  03/26/2017. FINDINGS: Stable enlargement of the cardiac silhouette. Diffuse increase in prominence of the interstitial markings with mild diffuse bilateral alveolar opacities. No significant change in a moderate-sized right pleural effusion. Mild increase in size of a smaller left pleural effusion. Atheromatous aortic calcifications. Left axillary surgical clips. Mild thoracic spine degenerative changes. IMPRESSION: 1. Worsening changes of congestive heart failure with interval alveolar edema superimposed on interstitial edema. 2. Mild increase in amount of left pleural fluid and stable right pleural fluid. 3. Stable cardiomegaly. Electronically Signed   By: Claudie Revering M.D.   On: 04/06/2017 11:13   Ct Angio Chest Pe W And/or Wo Contrast  Result Date: 04/29/2017 CLINICAL DATA:  Shortness of breath with abnormal biomarkers EXAM: CT ANGIOGRAPHY CHEST WITH CONTRAST TECHNIQUE: Multidetector CT imaging of the chest was performed using the standard  protocol during bolus administration of intravenous contrast. Multiplanar CT image reconstructions and MIPs were obtained to evaluate the vascular anatomy. CONTRAST:  75 mL Isovue 370 intravenous  COMPARISON:  Radiograph 04/29/2017, CT chest 02/04/2017, 10/15/2016 FINDINGS: Cardiovascular: Technologist inadvertently did not completely image the bilateral lung bases. Satisfactory opacification of the pulmonary arterial system to the segmental level. No central embolus is visualized. Heavily calcified aorta without aneurysmal dilatation. Coronary artery calcification. Cardiomegaly. No large pericardial effusion. Mediastinum/Nodes: Midline trachea. No thyroid mass. Increased mediastinal adenopathy, for example in 18 mm lymph node adjacent to the aortic arch. 16 mm right precarinal lymph node. 16 mm lymph node above the aortic arch. Esophagus is within normal limits. Lungs/Pleura: Small left pleural effusion. Small moderate partially loculated right pleural effusion. Mild emphysema. Development of multifocal ground-glass densities within the upper lobes with worsened consolidation in the right lower lobe. Negative for a pneumothorax. Upper Abdomen: Inadequately visualized. Musculoskeletal: Patchy foci of sclerosis in the sternum and vertebral bodies. No change from prior. Review of the MIP images confirms the above findings. IMPRESSION: 1. Incompletely imaged lung bases. Allowing for this, no definite acute central embolus is visualized. 2. Bilateral pleural effusions, small on the left and small moderate on the right with mild loculation of the right pleural effusion. Right pleural effusion is increased compared to the most recent prior chest CT. Development of multifocal ground-glass densities within the upper lobes with progressed consolidation in the right lower lobe possibly due to multifocal pneumonia. Underlying emphysema. 3. Continued moderate mediastinal adenopathy, similar to most recent prior CT but progressed since 2017. 4. Cardiomegaly Aortic Atherosclerosis (ICD10-I70.0) and Emphysema (ICD10-J43.9). Electronically Signed   By: Donavan Foil M.D.   On: 04/29/2017 22:05   Dg Chest Port 1  View  Result Date: 04/29/2017 CLINICAL DATA:  Onset of shortness of breath today. EXAM: PORTABLE CHEST 1 VIEW COMPARISON:  Single-view of the chest 04/09/2017. PA and lateral chest 04/06/2017 02/03/2017. CT chest 02/04/2017. FINDINGS: The lungs are emphysematous. Small to moderate pleural effusions, larger on the right, persist without marked change. Associated basilar airspace disease is seen. There is interstitial pulmonary edema. Cardiomegaly noted. No pneumothorax. Atherosclerosis is noted. IMPRESSION: No marked change in pulmonary edema with right greater than left pleural effusions and basilar airspace disease since the most recent examination. Cardiomegaly. Atherosclerosis. Electronically Signed   By: Inge Rise M.D.   On: 04/29/2017 18:59   Dg Chest Port 1 View  Result Date: 04/09/2017 CLINICAL DATA:  Shortness of Breath EXAM: PORTABLE CHEST 1 VIEW COMPARISON:  04/06/2017 FINDINGS: Cardiac shadow remains enlarged. Aortic calcifications are again seen. Right pleural effusion and likely underlying atelectasis is identified although aeration is improved when compared with the prior exam. The degree of vascular congestion has also improved from the prior study. No bony abnormality is seen. IMPRESSION: Improved CHF when compare with the prior exam. Electronically Signed   By: Inez Catalina M.D.   On: 04/09/2017 12:10   Ir US Chest  Result Date: 04/30/2017 CLINICAL DATA:  PLEURAL EFFUSIONS, PNEUMONIA EXAM: CHEST ULTRASOUND COMPARISON:  04/29/2017 CT FINDINGS: limited ultrasound of the chest demonstrates a large right pleural effusion and a very small left pleural effusion. this correlates with the previous chest CT. After discussion with the patient, she wishes to not have the procedure at this time. IMPRESSION: Right greater than left pleural effusions. Right effusion is amenable to thoracentesis. See above comment. Procedure not performed. Electronically Signed   By: Jerilynn Mages.  Shick M.D.   On:  04/30/2017  12:57    Lab Data:  CBC:  Recent Labs Lab 04/29/17 2232 04/29/17 2356 05/01/17 0703 05/02/17 0315  WBC 8.7 7.4 10.4 11.9*  NEUTROABS 8.4* 7.2  --   --   HGB 12.0 12.7 11.1* 11.8*  HCT 37.7 39.9 36.0 38.6  MCV 88.7 89.1 89.8 90.4  PLT 330 323 361 836   Basic Metabolic Panel:  Recent Labs Lab 04/29/17 1815 04/29/17 2356 05/01/17 0703 05/02/17 0315  NA 141 139 136 134*  K 3.5 3.2* 4.3 5.2*  CL 111 109 104 104  CO2 19* 19* 20* 22  GLUCOSE 154* 225* 199* 232*  BUN 26* 30* 46* 59*  CREATININE 1.49* 1.74* 2.26* 2.66*  CALCIUM 9.0 8.8* 8.6* 9.1   GFR: Estimated Creatinine Clearance: 16.7 mL/min (A) (by C-G formula based on SCr of 2.66 mg/dL (H)). Liver Function Tests:  Recent Labs Lab 04/29/17 1815  AST 20  ALT 12*  ALKPHOS 106  BILITOT 0.6  PROT 7.7  ALBUMIN 2.7*   No results for input(s): LIPASE, AMYLASE in the last 168 hours. No results for input(s): AMMONIA in the last 168 hours. Coagulation Profile: No results for input(s): INR, PROTIME in the last 168 hours. Cardiac Enzymes:  Recent Labs Lab 04/29/17 1815 04/29/17 2356 04/30/17 0740 04/30/17 1402  TROPONINI 0.06* 0.06* 0.05* 0.04*   BNP (last 3 results) No results for input(s): PROBNP in the last 8760 hours. HbA1C: No results for input(s): HGBA1C in the last 72 hours. CBG:  Recent Labs Lab 05/01/17 2306  GLUCAP 208*   Lipid Profile: No results for input(s): CHOL, HDL, LDLCALC, TRIG, CHOLHDL, LDLDIRECT in the last 72 hours. Thyroid Function Tests: No results for input(s): TSH, T4TOTAL, FREET4, T3FREE, THYROIDAB in the last 72 hours. Anemia Panel: No results for input(s): VITAMINB12, FOLATE, FERRITIN, TIBC, IRON, RETICCTPCT in the last 72 hours. Urine analysis:    Component Value Date/Time   COLORURINE YELLOW 03/23/2017 2057   APPEARANCEUR HAZY (A) 03/23/2017 2057   LABSPEC 1.013 03/23/2017 2057   PHURINE 5.0 03/23/2017 2057   GLUCOSEU NEGATIVE 03/23/2017 2057   HGBUR  NEGATIVE 03/23/2017 2057   BILIRUBINUR NEGATIVE 03/23/2017 2057   KETONESUR NEGATIVE 03/23/2017 2057   PROTEINUR >=300 (A) 03/23/2017 2057   UROBILINOGEN 0.2 10/31/2016 1050   NITRITE NEGATIVE 03/23/2017 2057   LEUKOCYTESUR NEGATIVE 03/23/2017 2057     Ortencia Askari M.D. [facility] 05/02/2017, 11:26 AM  Pager: [phone] Between 7am to 7pm - call Pager - [phone]  After 7pm go to [email or web address] - password [email or web address]  Call night coverage person covering after 7pm

## 2017-05-02 NOTE — Procedures (Signed)
   Right US guided thoracentesis  420 cc blood tinged fluid Sent for labs per MD  Tolerated well  cxr pending

## 2017-05-03 ENCOUNTER — Encounter (HOSPITAL_COMMUNITY): Payer: Self-pay | Admitting: Radiology

## 2017-05-03 DIAGNOSIS — R0600 Dyspnea, unspecified: Secondary | ICD-10-CM

## 2017-05-03 LAB — BASIC METABOLIC PANEL
ANION GAP: 10 (ref 5–15)
BUN: 70 mg/dL — ABNORMAL HIGH (ref 6–20)
CHLORIDE: 99 mmol/L — AB (ref 101–111)
CO2: 20 mmol/L — AB (ref 22–32)
Calcium: 8.9 mg/dL (ref 8.9–10.3)
Creatinine, Ser: 2.36 mg/dL — ABNORMAL HIGH (ref 0.44–1.00)
GFR calc Af Amer: 24 mL/min — ABNORMAL LOW (ref >60)
GFR calc non Af Amer: 20 mL/min — ABNORMAL LOW (ref >60)
GLUCOSE: 323 mg/dL — AB (ref 65–99)
POTASSIUM: 5.8 mmol/L — AB (ref 3.5–5.1)
Sodium: 129 mmol/L — ABNORMAL LOW (ref 135–145)

## 2017-05-03 LAB — CBC
HEMATOCRIT: 38.2 % (ref 36.0–46.0)
Hemoglobin: 11.8 g/dL — ABNORMAL LOW (ref 12.0–15.0)
MCH: 27.6 pg (ref 26.0–34.0)
MCHC: 30.9 g/dL (ref 30.0–36.0)
MCV: 89.5 fL (ref 78.0–100.0)
Platelets: 381 10*3/uL (ref 150–400)
RBC: 4.27 MIL/uL (ref 3.87–5.11)
RDW: 16.2 % — ABNORMAL HIGH (ref 11.5–15.5)
WBC: 12.2 10*3/uL — AB (ref 4.0–10.5)

## 2017-05-03 MED ORDER — PREDNISONE 20 MG PO TABS
40.0000 mg | ORAL_TABLET | Freq: Every day | ORAL | Status: DC
  Administered 2017-05-03: 40 mg via ORAL
  Filled 2017-05-03: qty 2

## 2017-05-03 MED ORDER — FUROSEMIDE 40 MG PO TABS
40.0000 mg | ORAL_TABLET | Freq: Every day | ORAL | Status: DC
  Administered 2017-05-03 – 2017-05-04 (×2): 40 mg via ORAL
  Filled 2017-05-03 (×2): qty 1

## 2017-05-03 MED ORDER — SODIUM POLYSTYRENE SULFONATE 15 GM/60ML PO SUSP
30.0000 g | Freq: Once | ORAL | Status: AC
  Administered 2017-05-03: 30 g via ORAL
  Filled 2017-05-03: qty 120

## 2017-05-03 MED ORDER — ISOSORBIDE MONONITRATE ER 30 MG PO TB24
30.0000 mg | ORAL_TABLET | Freq: Every day | ORAL | Status: DC
  Administered 2017-05-03 – 2017-05-05 (×3): 30 mg via ORAL
  Filled 2017-05-03 (×3): qty 1

## 2017-05-03 MED ORDER — SODIUM CHLORIDE 0.9 % IV SOLN
1.0000 g | Freq: Once | INTRAVENOUS | Status: AC
  Administered 2017-05-03: 1 g via INTRAVENOUS
  Filled 2017-05-03: qty 10

## 2017-05-03 NOTE — Progress Notes (Signed)
Pt transferred via WC to 5W by NT on O2@2L /min [place].  No s/s of distress.  Respirations even and unlabored.

## 2017-05-03 NOTE — Progress Notes (Addendum)
Patient rested most of the night without the use of the Bipap.  Patient did have bigeminy once during the night and receiving scheduled medications at the time.  Patient was asymptomatic.

## 2017-05-03 NOTE — Progress Notes (Signed)
[facility]                                                                              Patient Demographics  Victoria Burke, is a 66 y.o. female, DOB - 11-19-1950, QQV:[id number]  Admit date - 04/29/2017   Admitting Physician Vianne Bulls, MD  Outpatient Primary MD for the patient is Dorena Dew, FNP  Outpatient specialists:   LOS - 4  days   Medical records reviewed and are as summarized below:    Chief Complaint  Patient presents with  . Shortness of Breath       Brief summary    Patient is a 66 year old female with COPD, chronic diastolic CHF, paroxysmal A. fib and chronic kidney disease stage III, CAD presented with acute shortness of breath and respiratory distress.  In ED patient was afebrile, requiring 6 L of O2.  Creatinine at 1.49.  CT angiogram showed no PE, right pleural effusion, multifocal groundglass densities concerning for multifocal pneumonia.  The patient was placed on BiPAP in ED.    Assessment & Plan    Principal Problem:   Acute respiratory failure with hypoxia (HCC) - Multifactorial related acute CHF exacerbation, multifocal pneumonia, pleural effusion, anemia - BNP 1500, increased right-sided pleural effusion, chest x-ray with multifocal densities -Underwent thoracentesis, 420 cc fluid removed, follow labs, no empyema per labs   Active Problems:   Chronic obstructive pulmonary disease (HCC) exacerbation, HCAP/multifocal pneumonia -BiPAP off, no wheezing, -Continue IV antibiotics, duo nebs, transition to oral prednisone -DC'ed  vancomycin, continue cefepime for now    Acute on chronic diastolic CHF (congestive heart failure) ([place]), elevated troponins, underlying history of CAD -Recent echo 10/1 with EF of 65-70% with grade 2 diastolic dysfunction, currently no chest pain -Creatinine improving, restart oral Lasix 40 mg daily - Continue to hold amlodipine, losartan    Generalized anxiety disorder -Currently stable, cont  buspar     Recurrent right pleural effusion -Status post thoracentesis, 420 cc fluid removed, follow labs, cultures    Chronic Kidney disease, chronic, stage III (GFR 30-59 ml/min) (HCC) -Baseline creatinine ~1.9 -Creatinine improving, restart Lasix 40 mg oral daily    Paroxysmal atrial fibrillation (HCC) -Stable, continue amiodarone - CHADSvasc 5 not on anticoagulation due to poor compliance,   Code Status: full DVT Prophylaxis:  heparin  Family Communication: Discussed in detail with the patient, all imaging results, lab results explained to the patient.  Discussed with the family on 10/25   Disposition Plan: Transfer to tele floor   Time Spent in minutes  25 minutes  Procedures:  Ultrasound-guided thoracentesis  Consultants:   IR   Antimicrobials:   IV vancomycin 10/23 > 10/26   IV cefepime 10/23    Medications  Scheduled Meds: . amiodarone  200 mg Oral BID  . anastrozole  1 mg Oral Daily  . aspirin  324 mg Oral Once  . aspirin EC  81 mg Oral Daily  . busPIRone  10 mg Oral BID  . furosemide  40 mg Oral Daily  . gabapentin  300 mg Oral TID  . heparin  5,000 Units Subcutaneous Q8H  . hydrALAZINE  100 mg Oral Q8H  . ipratropium-albuterol  3 mL Nebulization TID  . isosorbide mononitrate  30 mg Oral Daily  . mouth rinse  15 mL Mouth Rinse BID  . predniSONE  40 mg Oral Q breakfast  . sodium chloride flush  3 mL Intravenous Q12H  . sodium polystyrene  30 g Oral Once   Continuous Infusions: . sodium chloride Stopped (05/02/17 1500)  . calcium gluconate    . ceFEPime (MAXIPIME) IV Stopped (05/02/17 2335)   PRN Meds:.sodium chloride, albuterol, hydrALAZINE, lidocaine, MUSCLE RUB, sodium chloride flush, traZODone   Antibiotics   Anti-infectives    Start     Dose/Rate Route Frequency Ordered Stop   05/02/17 2315  vancomycin (VANCOCIN) 500 mg in sodium chloride 0.9 % 100 mL IVPB  Status:  Discontinued     500 mg 100 mL/hr over 60 Minutes Intravenous Every 24  hours 05/01/17 2348 05/02/17 0754   05/01/17 2300  ceFEPIme (MAXIPIME) 1 g in dextrose 5 % 50 mL IVPB     1 g 100 mL/hr over 30 Minutes Intravenous Every 24 hours 05/01/17 1053 05/07/17 2259   04/29/17 2315  vancomycin (VANCOCIN) IVPB 750 mg/150 ml premix  Status:  Discontinued     750 mg 150 mL/hr over 60 Minutes Intravenous Every 24 hours 04/29/17 2300 05/01/17 2348   04/29/17 2300  ceFEPIme (MAXIPIME) 2 g in dextrose 5 % 50 mL IVPB  Status:  Discontinued     2 g 100 mL/hr over 30 Minutes Intravenous Every 24 hours 04/29/17 2243 05/01/17 1053   04/29/17 2245  cefTRIAXone (ROCEPHIN) 1 g in dextrose 5 % 50 mL IVPB  Status:  Discontinued     1 g 100 mL/hr over 30 Minutes Intravenous  Once 04/29/17 2232 04/29/17 2237   04/29/17 2245  azithromycin (ZITHROMAX) tablet 1,000 mg  Status:  Discontinued     1,000 mg Oral  Once 04/29/17 2232 04/29/17 2237        Subjective:   Victoria Burke was seen and examined today.  Feeling somewhat better, did not require BiPAP overnight.  O2 sat is 97% on 2 L.  Shortness of breath improving, no fevers or chills.  Patient denies dizziness, abdominal pain, N/V/D/C, new weakness, numbess, tingling. No acute events overnight.     Objective:   Vitals:   05/03/17 0900 05/03/17 1000 05/03/17 1100 05/03/17 1159  BP:  (!) 171/81 (!) 165/67   Pulse: 69 68 62   Resp: 17 17 (!) 23   Temp:    98.1 F (36.7 C)  TempSrc:    Axillary  SpO2: 92% 96% 97%   Weight:      Height:        Intake/Output Summary (Last 24 hours) at 05/03/17 1220 Last data filed at 05/03/17 0918  Gross per 24 hour  Intake              926 ml  Output              650 ml  Net              276 ml     Wt Readings from Last 3 Encounters:  05/03/17 62.2 kg (137 lb 2 oz)  04/09/17 55.4 kg (122 lb 2.2 oz)  03/30/17 51.7 kg (114 lb)     Exam   General: Alert and oriented x 3, NAD  Eyes:   HEENT:  Atraumatic, normocephalic  Cardiovascular: S1 S2 auscultated, RRR, trace edema  Respiratory: dec BS at bases   Gastrointestinal: Soft, nontender, nondistended, + bowel sounds  Ext: trace pedal edema bilaterally  Neuro: no neurological deficits  Musculoskeletal: No digital cyanosis, clubbing  Skin: No rashes  Psych: Normal affect and demeanor, alert and oriented x3   Data Reviewed:  I have personally reviewed following labs and imaging studies  Micro Results Recent Results (from the past 240 hour(s))  MRSA PCR Screening     Status: None   Collection Time: 04/29/17 11:00 PM  Result Value Ref Range Status   MRSA by PCR NEGATIVE NEGATIVE Final    Comment:        The GeneXpert MRSA Assay (FDA approved for NASAL specimens only), is one component of a comprehensive MRSA colonization surveillance program. It is not intended to diagnose MRSA infection nor to guide or monitor treatment for MRSA infections.   Culture, blood (routine x 2) Call MD if unable to obtain prior to antibiotics being given     Status: None (Preliminary result)   Collection Time: 04/29/17 11:25 PM  Result Value Ref Range Status   Specimen Description BLOOD RIGHT HAND  Final   Special Requests   Final    BOTTLES DRAWN AEROBIC AND ANAEROBIC Blood Culture adequate volume   Culture NO GROWTH 3 DAYS  Final   Report Status PENDING  Incomplete  Culture, blood (routine x 2) Call MD if unable to obtain prior to antibiotics being given     Status: None (Preliminary result)   Collection Time: 04/29/17 11:32 PM  Result Value Ref Range Status   Specimen Description BLOOD LEFT HAND  Final   Special Requests   Final    BOTTLES DRAWN AEROBIC AND ANAEROBIC Blood Culture results may not be optimal due to an excessive volume of blood received in culture bottles   Culture NO GROWTH 3 DAYS  Final   Report Status PENDING  Incomplete  Gram stain     Status: None   Collection Time: 05/02/17  4:06 PM  Result Value Ref Range Status   Specimen Description PERITONEAL  Final   Special Requests NONE   Final   Gram Stain   Final    ABUNDANT WBC PRESENT,BOTH PMN AND MONONUCLEAR NO ORGANISMS SEEN    Report Status 05/02/2017 FINAL  Final  Culture, body fluid-bottle     Status: None (Preliminary result)   Collection Time: 05/02/17  4:06 PM  Result Value Ref Range Status   Specimen Description PERITONEAL  Final   Special Requests NONE  Final   Culture NO GROWTH < 24 HOURS  Final   Report Status PENDING  Incomplete    Radiology Reports Dg Chest 1 View  Result Date: 05/02/2017 CLINICAL DATA:  Status post thoracentesis EXAM: CHEST 1 VIEW COMPARISON:  05/01/2017, 04/29/2017 FINDINGS: Cardiomegaly with central vascular congestion and mild diffuse interstitial pulmonary edema. Slight decreased right pleural effusion compared to prior. Partial consolidations at the right lung base. Small left effusion. Aortic atherosclerosis. No pneumothorax. IMPRESSION: 1. Slight decreased right pleural effusion post thoracentesis. No pneumothorax. Tiny left pleural effusion 2. Cardiomegaly with vascular congestion and continued mild pulmonary edema 3. Superimposed atelectasis or infiltrate at the right middle lobe and lung base. Electronically Signed   By: Donavan Foil M.D.   On: 05/02/2017 16:06   Dg Chest 2 View  Result Date: 05/01/2017 CLINICAL DATA:  Pleural effusion short of breath. History of breast cancer EXAM: CHEST  2 VIEW COMPARISON:  04/29/2017 FINDINGS: Cardiac enlargement. Mild improvement in vascular  congestion and edema. Moderate right effusion unchanged. Small left effusion unchanged. Bibasilar atelectasis improved on the right. Atherosclerotic aorta IMPRESSION: Congestive heart failure with mild improvement pulmonary edema. Electronically Signed   By: Franchot Gallo M.D.   On: 05/01/2017 11:01   Dg Chest 2 View  Result Date: 04/06/2017 CLINICAL DATA:  Shortness breath on exertion.  Long history of COPD. EXAM: CHEST  2 VIEW COMPARISON:  03/26/2017. FINDINGS: Stable enlargement of the cardiac  silhouette. Diffuse increase in prominence of the interstitial markings with mild diffuse bilateral alveolar opacities. No significant change in a moderate-sized right pleural effusion. Mild increase in size of a smaller left pleural effusion. Atheromatous aortic calcifications. Left axillary surgical clips. Mild thoracic spine degenerative changes. IMPRESSION: 1. Worsening changes of congestive heart failure with interval alveolar edema superimposed on interstitial edema. 2. Mild increase in amount of left pleural fluid and stable right pleural fluid. 3. Stable cardiomegaly. Electronically Signed   By: Claudie Revering M.D.   On: 04/06/2017 11:13   Ct Angio Chest Pe W And/or Wo Contrast  Result Date: 04/29/2017 CLINICAL DATA:  Shortness of breath with abnormal biomarkers EXAM: CT ANGIOGRAPHY CHEST WITH CONTRAST TECHNIQUE: Multidetector CT imaging of the chest was performed using the standard protocol during bolus administration of intravenous contrast. Multiplanar CT image reconstructions and MIPs were obtained to evaluate the vascular anatomy. CONTRAST:  75 mL Isovue 370 intravenous COMPARISON:  Radiograph 04/29/2017, CT chest 02/04/2017, 10/15/2016 FINDINGS: Cardiovascular: Technologist inadvertently did not completely image the bilateral lung bases. Satisfactory opacification of the pulmonary arterial system to the segmental level. No central embolus is visualized. Heavily calcified aorta without aneurysmal dilatation. Coronary artery calcification. Cardiomegaly. No large pericardial effusion. Mediastinum/Nodes: Midline trachea. No thyroid mass. Increased mediastinal adenopathy, for example in 18 mm lymph node adjacent to the aortic arch. 16 mm right precarinal lymph node. 16 mm lymph node above the aortic arch. Esophagus is within normal limits. Lungs/Pleura: Small left pleural effusion. Small moderate partially loculated right pleural effusion. Mild emphysema. Development of multifocal ground-glass densities  within the upper lobes with worsened consolidation in the right lower lobe. Negative for a pneumothorax. Upper Abdomen: Inadequately visualized. Musculoskeletal: Patchy foci of sclerosis in the sternum and vertebral bodies. No change from prior. Review of the MIP images confirms the above findings. IMPRESSION: 1. Incompletely imaged lung bases. Allowing for this, no definite acute central embolus is visualized. 2. Bilateral pleural effusions, small on the left and small moderate on the right with mild loculation of the right pleural effusion. Right pleural effusion is increased compared to the most recent prior chest CT. Development of multifocal ground-glass densities within the upper lobes with progressed consolidation in the right lower lobe possibly due to multifocal pneumonia. Underlying emphysema. 3. Continued moderate mediastinal adenopathy, similar to most recent prior CT but progressed since 2017. 4. Cardiomegaly Aortic Atherosclerosis (ICD10-I70.0) and Emphysema (ICD10-J43.9). Electronically Signed   By: Donavan Foil M.D.   On: 04/29/2017 22:05   Dg Chest Port 1 View  Result Date: 04/29/2017 CLINICAL DATA:  Onset of shortness of breath today. EXAM: PORTABLE CHEST 1 VIEW COMPARISON:  Single-view of the chest 04/09/2017. PA and lateral chest 04/06/2017 02/03/2017. CT chest 02/04/2017. FINDINGS: The lungs are emphysematous. Small to moderate pleural effusions, larger on the right, persist without marked change. Associated basilar airspace disease is seen. There is interstitial pulmonary edema. Cardiomegaly noted. No pneumothorax. Atherosclerosis is noted. IMPRESSION: No marked change in pulmonary edema with right greater than left pleural effusions and basilar airspace  disease since the most recent examination. Cardiomegaly. Atherosclerosis. Electronically Signed   By: Inge Rise M.D.   On: 04/29/2017 18:59   Dg Chest Port 1 View  Result Date: 04/09/2017 CLINICAL DATA:  Shortness of Breath  EXAM: PORTABLE CHEST 1 VIEW COMPARISON:  04/06/2017 FINDINGS: Cardiac shadow remains enlarged. Aortic calcifications are again seen. Right pleural effusion and likely underlying atelectasis is identified although aeration is improved when compared with the prior exam. The degree of vascular congestion has also improved from the prior study. No bony abnormality is seen. IMPRESSION: Improved CHF when compare with the prior exam. Electronically Signed   By: Inez Catalina M.D.   On: 04/09/2017 12:10   Ir US Chest  Result Date: 04/30/2017 CLINICAL DATA:  PLEURAL EFFUSIONS, PNEUMONIA EXAM: CHEST ULTRASOUND COMPARISON:  04/29/2017 CT FINDINGS: limited ultrasound of the chest demonstrates a large right pleural effusion and a very small left pleural effusion. this correlates with the previous chest CT. After discussion with the patient, she wishes to not have the procedure at this time. IMPRESSION: Right greater than left pleural effusions. Right effusion is amenable to thoracentesis. See above comment. Procedure not performed. Electronically Signed   By: Jerilynn Mages.  Shick M.D.   On: 04/30/2017 12:57   Ir Thoracentesis Asp Pleural Space W/img Guide  Result Date: 05/03/2017 INDICATION: Symptomatic right sided pleural effusion EXAM: IR THORACENTESIS ASP PLEURAL SPACE W/IMG GUIDE COMPARISON:  None. MEDICATIONS: 10 cc 2% lidocaine. COMPLICATIONS: None immediate. TECHNIQUE: Informed written consent was obtained from the patient after a discussion of the risks, benefits and alternatives to treatment. A timeout was performed prior to the initiation of the procedure. Initial ultrasound scanning demonstrates a right pleural effusion. The lower chest was prepped and draped in the usual sterile fashion. 1% lidocaine was used for local anesthesia. Under direct ultrasound guidance, a 19 gauge, 7-cm, Yueh catheter was introduced. An ultrasound image was saved for documentation purposes. the thoracentesis was performed. The catheter was  removed and a dressing was applied. The patient tolerated the procedure well without immediate post procedural complication. The patient was escorted to have an upright chest radiograph. FINDINGS: A total of approximately 420 cc of blood tinged fluid fluid was removed. Requested samples were sent to the laboratory. IMPRESSION: Successful ultrasound-guided right sided thoracentesis yielding 420 cc liters of pleural fluid. Read by Lavonia Drafts [facility] Electronically Signed   By: Lucrezia Europe M.D.   On: 05/03/2017 08:31    Lab Data:  CBC:  Recent Labs Lab 04/29/17 2232 04/29/17 2356 05/01/17 0703 05/02/17 0315 05/03/17 0544  WBC 8.7 7.4 10.4 11.9* 12.2*  NEUTROABS 8.4* 7.2  --   --   --   HGB 12.0 12.7 11.1* 11.8* 11.8*  HCT 37.7 39.9 36.0 38.6 38.2  MCV 88.7 89.1 89.8 90.4 89.5  PLT 330 323 361 397 789   Basic Metabolic Panel:  Recent Labs Lab 04/29/17 1815 04/29/17 2356 05/01/17 0703 05/02/17 0315 05/03/17 0544  NA 141 139 136 134* 129*  K 3.5 3.2* 4.3 5.2* 5.8*  CL 111 109 104 104 99*  CO2 19* 19* 20* 22 20*  GLUCOSE 154* 225* 199* 232* 323*  BUN 26* 30* 46* 59* 70*  CREATININE 1.49* 1.74* 2.26* 2.66* 2.36*  CALCIUM 9.0 8.8* 8.6* 9.1 8.9   GFR: Estimated Creatinine Clearance: 20.6 mL/min (A) (by C-G formula based on SCr of 2.36 mg/dL (H)). Liver Function Tests:  Recent Labs Lab 04/29/17 1815  AST 20  ALT 12*  ALKPHOS 106  BILITOT 0.6  PROT 7.7  ALBUMIN 2.7*   No results for input(s): LIPASE, AMYLASE in the last 168 hours. No results for input(s): AMMONIA in the last 168 hours. Coagulation Profile: No results for input(s): INR, PROTIME in the last 168 hours. Cardiac Enzymes:  Recent Labs Lab 04/29/17 1815 04/29/17 2356 04/30/17 0740 04/30/17 1402  TROPONINI 0.06* 0.06* 0.05* 0.04*   BNP (last 3 results) No results for input(s): PROBNP in the last 8760 hours. HbA1C: No results for input(s): HGBA1C in the last 72 hours. CBG:  Recent Labs Lab  05/01/17 2306  GLUCAP 208*   Lipid Profile: No results for input(s): CHOL, HDL, LDLCALC, TRIG, CHOLHDL, LDLDIRECT in the last 72 hours. Thyroid Function Tests: No results for input(s): TSH, T4TOTAL, FREET4, T3FREE, THYROIDAB in the last 72 hours. Anemia Panel: No results for input(s): VITAMINB12, FOLATE, FERRITIN, TIBC, IRON, RETICCTPCT in the last 72 hours. Urine analysis:    Component Value Date/Time   COLORURINE YELLOW 03/23/2017 2057   APPEARANCEUR HAZY (A) 03/23/2017 2057   LABSPEC 1.013 03/23/2017 2057   PHURINE 5.0 03/23/2017 2057   GLUCOSEU NEGATIVE 03/23/2017 2057   HGBUR NEGATIVE 03/23/2017 2057   BILIRUBINUR NEGATIVE 03/23/2017 2057   KETONESUR NEGATIVE 03/23/2017 2057   PROTEINUR >=300 (A) 03/23/2017 2057   UROBILINOGEN 0.2 10/31/2016 1050   NITRITE NEGATIVE 03/23/2017 2057   LEUKOCYTESUR NEGATIVE 03/23/2017 2057     Ripudeep Rai M.D. [facility] 05/03/2017, 12:20 PM  Pager: [phone] Between 7am to 7pm - call Pager - [phone]  After 7pm go to [email or web address] - password [email or web address]  Call night coverage person covering after 7pm

## 2017-05-03 NOTE — Progress Notes (Signed)
Report given to  Ilda Mori, RN who will assume care of pt upon arrival to [phone].  All questions regarding pt answered and denies further questions.

## 2017-05-03 NOTE — Progress Notes (Signed)
Pt transferred to 5W. Pt. Resting without pain and comfortable. New assessment completed and no change from transfer report.

## 2017-05-04 LAB — BASIC METABOLIC PANEL
ANION GAP: 12 (ref 5–15)
BUN: 71 mg/dL — AB (ref 6–20)
CALCIUM: 8.9 mg/dL (ref 8.9–10.3)
CO2: 20 mmol/L — ABNORMAL LOW (ref 22–32)
Chloride: 96 mmol/L — ABNORMAL LOW (ref 101–111)
Creatinine, Ser: 2.25 mg/dL — ABNORMAL HIGH (ref 0.44–1.00)
GFR calc Af Amer: 25 mL/min — ABNORMAL LOW (ref >60)
GFR, EST NON AFRICAN AMERICAN: 22 mL/min — AB (ref >60)
Glucose, Bld: 556 mg/dL (ref 65–99)
POTASSIUM: 4.5 mmol/L (ref 3.5–5.1)
SODIUM: 128 mmol/L — AB (ref 135–145)

## 2017-05-04 LAB — STREP PNEUMONIAE URINARY ANTIGEN: Strep Pneumo Urinary Antigen: NEGATIVE

## 2017-05-04 LAB — GLUCOSE, CAPILLARY
GLUCOSE-CAPILLARY: 412 mg/dL — AB (ref 65–99)
GLUCOSE-CAPILLARY: 370 mg/dL — AB (ref 65–99)
Glucose-Capillary: 133 mg/dL — ABNORMAL HIGH (ref 65–99)
Glucose-Capillary: 130 mg/dL — ABNORMAL HIGH (ref 65–99)

## 2017-05-04 LAB — PH, BODY FLUID: pH, Body Fluid: 7.4

## 2017-05-04 LAB — HEMOGLOBIN A1C
HEMOGLOBIN A1C: 6.4 % — AB (ref 4.8–5.6)
Mean Plasma Glucose: 136.98 mg/dL

## 2017-05-04 MED ORDER — INSULIN ASPART 100 UNIT/ML ~~LOC~~ SOLN
4.0000 [IU] | Freq: Three times a day (TID) | SUBCUTANEOUS | Status: DC
  Administered 2017-05-04 – 2017-05-05 (×3): 4 [IU] via SUBCUTANEOUS

## 2017-05-04 MED ORDER — INSULIN ASPART 100 UNIT/ML ~~LOC~~ SOLN
3.0000 [IU] | Freq: Three times a day (TID) | SUBCUTANEOUS | Status: DC
  Administered 2017-05-04: 3 [IU] via SUBCUTANEOUS

## 2017-05-04 MED ORDER — INSULIN ASPART 100 UNIT/ML ~~LOC~~ SOLN: 0.0000 [IU] | Freq: Every day | SUBCUTANEOUS | Status: DC

## 2017-05-04 MED ORDER — INSULIN GLARGINE 100 UNIT/ML ~~LOC~~ SOLN
5.0000 [IU] | Freq: Every day | SUBCUTANEOUS | Status: DC
  Administered 2017-05-04 – 2017-05-05 (×2): 5 [IU] via SUBCUTANEOUS
  Filled 2017-05-04 (×2): qty 0.05

## 2017-05-04 MED ORDER — INSULIN ASPART 100 UNIT/ML ~~LOC~~ SOLN
0.0000 [IU] | Freq: Three times a day (TID) | SUBCUTANEOUS | Status: DC
  Administered 2017-05-04 (×2): 15 [IU] via SUBCUTANEOUS
  Administered 2017-05-04 – 2017-05-05 (×2): 2 [IU] via SUBCUTANEOUS

## 2017-05-04 NOTE — Progress Notes (Signed)
[facility]                                                                              Patient Demographics  Victoria Burke, is a 66 y.o. female, DOB - 09/12/1950, FTD:[id number]  Admit date - 04/29/2017   Admitting Physician Vianne Bulls, MD  Outpatient Primary MD for the patient is Dorena Dew, FNP  Outpatient specialists:   LOS - 5  days   Medical records reviewed and are as summarized below:    Chief Complaint  Patient presents with  . Shortness of Breath       Brief summary    Patient is a 66 year old female with COPD, chronic diastolic CHF, paroxysmal A. fib and chronic kidney disease stage III, CAD presented with acute shortness of breath and respiratory distress.  In ED patient was afebrile, requiring 6 L of O2.  Creatinine at 1.49.  CT angiogram showed no PE, right pleural effusion, multifocal groundglass densities concerning for multifocal pneumonia.  The patient was placed on BiPAP in ED.    Assessment & Plan    Principal Problem:   Acute respiratory failure with hypoxia (HCC) - Multifactorial related acute CHF exacerbation, multifocal pneumonia, pleural effusion, anemia - BNP 1500, increased right-sided pleural effusion, chest x-ray with multifocal densities -Underwent thoracentesis, 420 cc fluid removed, follow labs, no empyema per labs   Active Problems:   Chronic obstructive pulmonary disease (HCC) exacerbation, HCAP/multifocal pneumonia -BiPAP off, no wheezing, -Continue IV cefepime, DC prednisone due to excessive hyperglycemia, no wheezing -Continue nebs, will likely need nebs machine at home    Acute on chronic diastolic CHF (congestive heart failure) ([place]), elevated troponins, underlying history of CAD -Recent echo 10/1 with EF of 65-70% with grade 2 diastolic dysfunction, currently no chest pain -Creatinine improving, restarted oral Lasix 40 mg daily - Continue to hold amlodipine, losartan   Hyperglycemia: Likely  steroid-induced -Hemoglobin A1c 6.4, placed on Lantus 5 units, NovoLog meal coverage and sliding scale insulin -Prednisone discontinued as patient is not having any wheezing at this time    Generalized anxiety disorder -Currently stable, cont buspar     Recurrent right pleural effusion -Status post thoracentesis, 420 cc fluid removed, follow labs, cultures    Chronic Kidney disease, chronic, stage III (GFR 30-59 ml/min) (HCC) -Baseline creatinine ~1.9 -Creatinine improving, continue Lasix daily    Paroxysmal atrial fibrillation (HCC) -Stable, continue amiodarone - CHADSvasc 5 not on anticoagulation due to poor compliance,   Code Status: full DVT Prophylaxis:  heparin  Family Communication: Discussed in detail with the patient, all imaging results, lab results explained to the patient.  Discussed with the family on 10/25.    Disposition Plan: PT OT today, out of bed, possible DC in a.m.  Time Spent in minutes  25 minutes  Procedures:  Ultrasound-guided thoracentesis  Consultants:   IR   Antimicrobials:   IV vancomycin 10/23 > 10/26   IV cefepime 10/23    Medications  Scheduled Meds: . amiodarone  200 mg Oral BID  . anastrozole  1 mg Oral Daily  . aspirin  324 mg Oral Once  . aspirin EC  81 mg Oral Daily  . busPIRone  10 mg Oral BID  . furosemide  40 mg Oral Daily  . gabapentin  300 mg Oral TID  . heparin  5,000 Units Subcutaneous Q8H  . hydrALAZINE  100 mg Oral Q8H  . insulin aspart  0-15 Units Subcutaneous TID WC  . insulin aspart  0-5 Units Subcutaneous QHS  . insulin aspart  4 Units Subcutaneous TID WC  . insulin glargine  5 Units Subcutaneous Daily  . ipratropium-albuterol  3 mL Nebulization TID  . isosorbide mononitrate  30 mg Oral Daily  . mouth rinse  15 mL Mouth Rinse BID  . sodium chloride flush  3 mL Intravenous Q12H   Continuous Infusions: . sodium chloride Stopped (05/02/17 1500)  . ceFEPime (MAXIPIME) IV 1 g (05/03/17 2256)   PRN  Meds:.sodium chloride, albuterol, hydrALAZINE, lidocaine, MUSCLE RUB, sodium chloride flush, traZODone   Antibiotics   Anti-infectives    Start     Dose/Rate Route Frequency Ordered Stop   05/02/17 2315  vancomycin (VANCOCIN) 500 mg in sodium chloride 0.9 % 100 mL IVPB  Status:  Discontinued     500 mg 100 mL/hr over 60 Minutes Intravenous Every 24 hours 05/01/17 2348 05/02/17 0754   05/01/17 2300  ceFEPIme (MAXIPIME) 1 g in dextrose 5 % 50 mL IVPB     1 g 100 mL/hr over 30 Minutes Intravenous Every 24 hours 05/01/17 1053 05/07/17 2259   04/29/17 2315  vancomycin (VANCOCIN) IVPB 750 mg/150 ml premix  Status:  Discontinued     750 mg 150 mL/hr over 60 Minutes Intravenous Every 24 hours 04/29/17 2300 05/01/17 2348   04/29/17 2300  ceFEPIme (MAXIPIME) 2 g in dextrose 5 % 50 mL IVPB  Status:  Discontinued     2 g 100 mL/hr over 30 Minutes Intravenous Every 24 hours 04/29/17 2243 05/01/17 1053   04/29/17 2245  cefTRIAXone (ROCEPHIN) 1 g in dextrose 5 % 50 mL IVPB  Status:  Discontinued     1 g 100 mL/hr over 30 Minutes Intravenous  Once 04/29/17 2232 04/29/17 2237   04/29/17 2245  azithromycin (ZITHROMAX) tablet 1,000 mg  Status:  Discontinued     1,000 mg Oral  Once 04/29/17 2232 04/29/17 2237        Subjective:   Victoria Burke was seen and examined today.  No new complaints overall improving, no chest pain.  O2 sats 94% on 2 L.  Shortness of breath improving.    Patient denies dizziness, abdominal pain, N/V/D/C, new weakness, numbess, tingling. No acute events overnight.     Objective:   Vitals:   05/03/17 2051 05/03/17 2159 05/04/17 0618 05/04/17 0807  BP:  (!) 154/58 (!) 185/79   Pulse: 81 80 74   Resp: (!) 22 18 17    Temp:  97.9 F (36.6 C) 98 F (36.7 C)   TempSrc:  Oral Oral   SpO2: 96% 96% 94% 95%  Weight:   63.4 kg (139 lb 11.2 oz)   Height:        Intake/Output Summary (Last 24 hours) at 05/04/17 1139 Last data filed at 05/03/17 2256  Gross per 24 hour  Intake               944 ml  Output              450 ml  Net              494 ml     Wt  Readings from Last 3 Encounters:  05/04/17 63.4 kg (139 lb 11.2 oz)  04/09/17 55.4 kg (122 lb 2.2 oz)  03/30/17 51.7 kg (114 lb)     Exam   General: Alert and oriented x 3, NAD  Eyes:   HEENT:    Cardiovascular: S1 S2 auscultated, trace pedal edema b/l  Respiratory: Decreased breath sound at the bases  Gastrointestinal: Soft, nontender, nondistended, + bowel sounds  Ext: trace pedal edema bilaterally  Neuro: no neurological deficits  Musculoskeletal: No digital cyanosis, clubbing  Skin: No rashes  Psych: Normal affect and demeanor, alert and oriented x3    Data Reviewed:  I have personally reviewed following labs and imaging studies  Micro Results Recent Results (from the past 240 hour(s))  MRSA PCR Screening     Status: None   Collection Time: 04/29/17 11:00 PM  Result Value Ref Range Status   MRSA by PCR NEGATIVE NEGATIVE Final    Comment:        The GeneXpert MRSA Assay (FDA approved for NASAL specimens only), is one component of a comprehensive MRSA colonization surveillance program. It is not intended to diagnose MRSA infection nor to guide or monitor treatment for MRSA infections.   Culture, blood (routine x 2) Call MD if unable to obtain prior to antibiotics being given     Status: None (Preliminary result)   Collection Time: 04/29/17 11:25 PM  Result Value Ref Range Status   Specimen Description BLOOD RIGHT HAND  Final   Special Requests   Final    BOTTLES DRAWN AEROBIC AND ANAEROBIC Blood Culture adequate volume   Culture NO GROWTH 3 DAYS  Final   Report Status PENDING  Incomplete  Culture, blood (routine x 2) Call MD if unable to obtain prior to antibiotics being given     Status: None (Preliminary result)   Collection Time: 04/29/17 11:32 PM  Result Value Ref Range Status   Specimen Description BLOOD LEFT HAND  Final   Special Requests   Final    BOTTLES  DRAWN AEROBIC AND ANAEROBIC Blood Culture results may not be optimal due to an excessive volume of blood received in culture bottles   Culture NO GROWTH 3 DAYS  Final   Report Status PENDING  Incomplete  Gram stain     Status: None   Collection Time: 05/02/17  4:06 PM  Result Value Ref Range Status   Specimen Description PERITONEAL  Final   Special Requests NONE  Final   Gram Stain   Final    ABUNDANT WBC PRESENT,BOTH PMN AND MONONUCLEAR NO ORGANISMS SEEN    Report Status 05/02/2017 FINAL  Final  Culture, body fluid-bottle     Status: None (Preliminary result)   Collection Time: 05/02/17  4:06 PM  Result Value Ref Range Status   Specimen Description PERITONEAL  Final   Special Requests NONE  Final   Culture NO GROWTH < 24 HOURS  Final   Report Status PENDING  Incomplete    Radiology Reports Dg Chest 1 View  Result Date: 05/02/2017 CLINICAL DATA:  Status post thoracentesis EXAM: CHEST 1 VIEW COMPARISON:  05/01/2017, 04/29/2017 FINDINGS: Cardiomegaly with central vascular congestion and mild diffuse interstitial pulmonary edema. Slight decreased right pleural effusion compared to prior. Partial consolidations at the right lung base. Small left effusion. Aortic atherosclerosis. No pneumothorax. IMPRESSION: 1. Slight decreased right pleural effusion post thoracentesis. No pneumothorax. Tiny left pleural effusion 2. Cardiomegaly with vascular congestion and continued mild pulmonary edema 3. Superimposed atelectasis or  infiltrate at the right middle lobe and lung base. Electronically Signed   By: Donavan Foil M.D.   On: 05/02/2017 16:06   Dg Chest 2 View  Result Date: 05/01/2017 CLINICAL DATA:  Pleural effusion short of breath. History of breast cancer EXAM: CHEST  2 VIEW COMPARISON:  04/29/2017 FINDINGS: Cardiac enlargement. Mild improvement in vascular congestion and edema. Moderate right effusion unchanged. Small left effusion unchanged. Bibasilar atelectasis improved on the right.  Atherosclerotic aorta IMPRESSION: Congestive heart failure with mild improvement pulmonary edema. Electronically Signed   By: Franchot Gallo M.D.   On: 05/01/2017 11:01   Dg Chest 2 View  Result Date: 04/06/2017 CLINICAL DATA:  Shortness breath on exertion.  Long history of COPD. EXAM: CHEST  2 VIEW COMPARISON:  03/26/2017. FINDINGS: Stable enlargement of the cardiac silhouette. Diffuse increase in prominence of the interstitial markings with mild diffuse bilateral alveolar opacities. No significant change in a moderate-sized right pleural effusion. Mild increase in size of a smaller left pleural effusion. Atheromatous aortic calcifications. Left axillary surgical clips. Mild thoracic spine degenerative changes. IMPRESSION: 1. Worsening changes of congestive heart failure with interval alveolar edema superimposed on interstitial edema. 2. Mild increase in amount of left pleural fluid and stable right pleural fluid. 3. Stable cardiomegaly. Electronically Signed   By: Claudie Revering M.D.   On: 04/06/2017 11:13   Ct Angio Chest Pe W And/or Wo Contrast  Result Date: 04/29/2017 CLINICAL DATA:  Shortness of breath with abnormal biomarkers EXAM: CT ANGIOGRAPHY CHEST WITH CONTRAST TECHNIQUE: Multidetector CT imaging of the chest was performed using the standard protocol during bolus administration of intravenous contrast. Multiplanar CT image reconstructions and MIPs were obtained to evaluate the vascular anatomy. CONTRAST:  75 mL Isovue 370 intravenous COMPARISON:  Radiograph 04/29/2017, CT chest 02/04/2017, 10/15/2016 FINDINGS: Cardiovascular: Technologist inadvertently did not completely image the bilateral lung bases. Satisfactory opacification of the pulmonary arterial system to the segmental level. No central embolus is visualized. Heavily calcified aorta without aneurysmal dilatation. Coronary artery calcification. Cardiomegaly. No large pericardial effusion. Mediastinum/Nodes: Midline trachea. No thyroid  mass. Increased mediastinal adenopathy, for example in 18 mm lymph node adjacent to the aortic arch. 16 mm right precarinal lymph node. 16 mm lymph node above the aortic arch. Esophagus is within normal limits. Lungs/Pleura: Small left pleural effusion. Small moderate partially loculated right pleural effusion. Mild emphysema. Development of multifocal ground-glass densities within the upper lobes with worsened consolidation in the right lower lobe. Negative for a pneumothorax. Upper Abdomen: Inadequately visualized. Musculoskeletal: Patchy foci of sclerosis in the sternum and vertebral bodies. No change from prior. Review of the MIP images confirms the above findings. IMPRESSION: 1. Incompletely imaged lung bases. Allowing for this, no definite acute central embolus is visualized. 2. Bilateral pleural effusions, small on the left and small moderate on the right with mild loculation of the right pleural effusion. Right pleural effusion is increased compared to the most recent prior chest CT. Development of multifocal ground-glass densities within the upper lobes with progressed consolidation in the right lower lobe possibly due to multifocal pneumonia. Underlying emphysema. 3. Continued moderate mediastinal adenopathy, similar to most recent prior CT but progressed since 2017. 4. Cardiomegaly Aortic Atherosclerosis (ICD10-I70.0) and Emphysema (ICD10-J43.9). Electronically Signed   By: Donavan Foil M.D.   On: 04/29/2017 22:05   Dg Chest Port 1 View  Result Date: 04/29/2017 CLINICAL DATA:  Onset of shortness of breath today. EXAM: PORTABLE CHEST 1 VIEW COMPARISON:  Single-view of the chest 04/09/2017. PA and  lateral chest 04/06/2017 02/03/2017. CT chest 02/04/2017. FINDINGS: The lungs are emphysematous. Small to moderate pleural effusions, larger on the right, persist without marked change. Associated basilar airspace disease is seen. There is interstitial pulmonary edema. Cardiomegaly noted. No pneumothorax.  Atherosclerosis is noted. IMPRESSION: No marked change in pulmonary edema with right greater than left pleural effusions and basilar airspace disease since the most recent examination. Cardiomegaly. Atherosclerosis. Electronically Signed   By: Inge Rise M.D.   On: 04/29/2017 18:59   Dg Chest Port 1 View  Result Date: 04/09/2017 CLINICAL DATA:  Shortness of Breath EXAM: PORTABLE CHEST 1 VIEW COMPARISON:  04/06/2017 FINDINGS: Cardiac shadow remains enlarged. Aortic calcifications are again seen. Right pleural effusion and likely underlying atelectasis is identified although aeration is improved when compared with the prior exam. The degree of vascular congestion has also improved from the prior study. No bony abnormality is seen. IMPRESSION: Improved CHF when compare with the prior exam. Electronically Signed   By: Inez Catalina M.D.   On: 04/09/2017 12:10   Ir US Chest  Result Date: 04/30/2017 CLINICAL DATA:  PLEURAL EFFUSIONS, PNEUMONIA EXAM: CHEST ULTRASOUND COMPARISON:  04/29/2017 CT FINDINGS: limited ultrasound of the chest demonstrates a large right pleural effusion and a very small left pleural effusion. this correlates with the previous chest CT. After discussion with the patient, she wishes to not have the procedure at this time. IMPRESSION: Right greater than left pleural effusions. Right effusion is amenable to thoracentesis. See above comment. Procedure not performed. Electronically Signed   By: Jerilynn Mages.  Shick M.D.   On: 04/30/2017 12:57   Ir Thoracentesis Asp Pleural Space W/img Guide  Result Date: 05/03/2017 INDICATION: Symptomatic right sided pleural effusion EXAM: IR THORACENTESIS ASP PLEURAL SPACE W/IMG GUIDE COMPARISON:  None. MEDICATIONS: 10 cc 2% lidocaine. COMPLICATIONS: None immediate. TECHNIQUE: Informed written consent was obtained from the patient after a discussion of the risks, benefits and alternatives to treatment. A timeout was performed prior to the initiation of the  procedure. Initial ultrasound scanning demonstrates a right pleural effusion. The lower chest was prepped and draped in the usual sterile fashion. 1% lidocaine was used for local anesthesia. Under direct ultrasound guidance, a 19 gauge, 7-cm, Yueh catheter was introduced. An ultrasound image was saved for documentation purposes. the thoracentesis was performed. The catheter was removed and a dressing was applied. The patient tolerated the procedure well without immediate post procedural complication. The patient was escorted to have an upright chest radiograph. FINDINGS: A total of approximately 420 cc of blood tinged fluid fluid was removed. Requested samples were sent to the laboratory. IMPRESSION: Successful ultrasound-guided right sided thoracentesis yielding 420 cc liters of pleural fluid. Read by Lavonia Drafts [facility] Electronically Signed   By: Lucrezia Europe M.D.   On: 05/03/2017 08:31    Lab Data:  CBC:  Recent Labs Lab 04/29/17 2232 04/29/17 2356 05/01/17 0703 05/02/17 0315 05/03/17 0544  WBC 8.7 7.4 10.4 11.9* 12.2*  NEUTROABS 8.4* 7.2  --   --   --   HGB 12.0 12.7 11.1* 11.8* 11.8*  HCT 37.7 39.9 36.0 38.6 38.2  MCV 88.7 89.1 89.8 90.4 89.5  PLT 330 323 361 397 161   Basic Metabolic Panel:  Recent Labs Lab 04/29/17 2356 05/01/17 0703 05/02/17 0315 05/03/17 0544 05/04/17 0158  NA 139 136 134* 129* 128*  K 3.2* 4.3 5.2* 5.8* 4.5  CL 109 104 104 99* 96*  CO2 19* 20* 22 20* 20*  GLUCOSE 225* 199* 232* 323*  556*  BUN 30* 46* 59* 70* 71*  CREATININE 1.74* 2.26* 2.66* 2.36* 2.25*  CALCIUM 8.8* 8.6* 9.1 8.9 8.9   GFR: Estimated Creatinine Clearance: 21.8 mL/min (A) (by C-G formula based on SCr of 2.25 mg/dL (H)). Liver Function Tests:  Recent Labs Lab 04/29/17 1815  AST 20  ALT 12*  ALKPHOS 106  BILITOT 0.6  PROT 7.7  ALBUMIN 2.7*   No results for input(s): LIPASE, AMYLASE in the last 168 hours. No results for input(s): AMMONIA in the last 168 hours. Coagulation  Profile: No results for input(s): INR, PROTIME in the last 168 hours. Cardiac Enzymes:  Recent Labs Lab 04/29/17 1815 04/29/17 2356 04/30/17 0740 04/30/17 1402  TROPONINI 0.06* 0.06* 0.05* 0.04*   BNP (last 3 results) No results for input(s): PROBNP in the last 8760 hours. HbA1C:  Recent Labs  05/04/17 0629  HGBA1C 6.4*   CBG:  Recent Labs Lab 05/01/17 2306 05/04/17 0745  GLUCAP 208* 412*   Lipid Profile: No results for input(s): CHOL, HDL, LDLCALC, TRIG, CHOLHDL, LDLDIRECT in the last 72 hours. Thyroid Function Tests: No results for input(s): TSH, T4TOTAL, FREET4, T3FREE, THYROIDAB in the last 72 hours. Anemia Panel: No results for input(s): VITAMINB12, FOLATE, FERRITIN, TIBC, IRON, RETICCTPCT in the last 72 hours. Urine analysis:    Component Value Date/Time   COLORURINE YELLOW 03/23/2017 2057   APPEARANCEUR HAZY (A) 03/23/2017 2057   LABSPEC 1.013 03/23/2017 2057   PHURINE 5.0 03/23/2017 2057   GLUCOSEU NEGATIVE 03/23/2017 2057   HGBUR NEGATIVE 03/23/2017 2057   BILIRUBINUR NEGATIVE 03/23/2017 2057   KETONESUR NEGATIVE 03/23/2017 2057   PROTEINUR >=300 (A) 03/23/2017 2057   UROBILINOGEN 0.2 10/31/2016 1050   NITRITE NEGATIVE 03/23/2017 2057   LEUKOCYTESUR NEGATIVE 03/23/2017 2057     Ripudeep Rai M.D. [facility] 05/04/2017, 11:39 AM  Pager: [phone] Between 7am to 7pm - call Pager - [phone]  After 7pm go to [email or web address] - password [email or web address]  Call night coverage person covering after 7pm

## 2017-05-04 NOTE — Evaluation (Signed)
Physical Therapy Evaluation Patient Details Name: Victoria Burke MRN: [id number] DOB: Feb 29, 1951 Today's Date: 05/04/2017   History of Present Illness  Patient is a 66 year old female with COPD, chronic diastolic CHF, paroxysmal A. fib, chronic kidney disease stage III, and CAD. Patient admitted 04/29/17 with SOB, acute respiratory failure with hypoxia, CHF, anemia.  Clinical Impression  Patient presents with problems listed below.  Will benefit from acute PT to maximize functional mobility prior to discharge.  Patient noted to have O2 sat of 85% sitting EOB with O2 off for a while.  Contacted RN and reapplied O2.  O2 sat increased to 90-91% with 2L O2. Patient independent pta.  Today patient required assist for mobility and gait/balance. Recommend ST-SNF for continued therapy at d/c.     Follow Up Recommendations SNF;Supervision/Assistance - 24 hour    Equipment Recommendations  3in1 (PT) (Rollator - 4 wheels and seat)    Recommendations for Other Services       Precautions / Restrictions Precautions Precautions: None Restrictions Weight Bearing Restrictions: No      Mobility  Bed Mobility Overal bed mobility: Modified Independent             General bed mobility comments: Increased time.  Patient had O2 off.  States nursing took it off a while ago.  Patient with O2 sat of 85% sitting still on room air.  Reapplied O2 and contacted RN with information.  Transfers Overall transfer level: Needs assistance Equipment used: Rolling walker (2 wheeled) Transfers: Sit to/from Stand Sit to Stand: Min guard         General transfer comment: Verbal cues for hand placement.  Assist for safety.  Ambulation/Gait Ambulation/Gait assistance: Min assist Ambulation Distance (Feet): 50 Feet Assistive device: Rolling walker (2 wheeled) Gait Pattern/deviations: Step-through pattern;Decreased stride length;Shuffle;Staggering left;Staggering right;Trunk flexed Gait velocity:  decreased Gait velocity interpretation: Below normal speed for age/gender General Gait Details: Verbal cues for safe use of RW.  Initially, patient with fairly good balance with RW.  After 20', patient began losing balance to both sides, requiring assist to prevent falls.  Patient reports no dizziness.  Returned to room.  O2 sats at 91% with gait on O2 at 2L.  Stairs            Wheelchair Mobility    Modified Rankin (Stroke Patients Only)       Balance Overall balance assessment: Needs assistance         Standing balance support: Single extremity supported Standing balance-Leahy Scale: Poor Standing balance comment: UE support for standing balance                             Pertinent Vitals/Pain Pain Assessment: No/denies pain    Home Living Family/patient expects to be discharged to:: Private residence Living Arrangements: Non-relatives/Friends (Roommate - Victoria Burke) Available Help at Discharge: Family;Friend(s);Available PRN/intermittently Type of Home: Apartment Home Access: Level entry     Home Layout: One level Home Equipment: None      Prior Function Level of Independence: Independent         Comments: Uses public transportation     Hand Dominance        Extremity/Trunk Assessment   Upper Extremity Assessment Upper Extremity Assessment: Overall WFL for tasks assessed    Lower Extremity Assessment Lower Extremity Assessment: Generalized weakness       Communication   Communication: No difficulties  Cognition Arousal/Alertness: Awake/alert Behavior During Therapy: WFL for  tasks assessed/performed;Flat affect Overall Cognitive Status: Within Functional Limits for tasks assessed                                        General Comments      Exercises     Assessment/Plan    PT Assessment Patient needs continued PT services  PT Problem List Decreased strength;Decreased activity tolerance;Decreased  balance;Decreased mobility;Decreased coordination;Decreased knowledge of use of DME;Cardiopulmonary status limiting activity       PT Treatment Interventions DME instruction;Gait training;Functional mobility training;Therapeutic activities;Therapeutic exercise;Balance training;Patient/family education    PT Goals (Current goals can be found in the Care Plan section)  Acute Rehab PT Goals Patient Stated Goal: To breathe better PT Goal Formulation: With patient Time For Goal Achievement: 05/11/17 Potential to Achieve Goals: Good    Frequency Min 3X/week   Barriers to discharge Decreased caregiver support      Co-evaluation               AM-PAC PT "6 Clicks" Daily Activity  Outcome Measure Difficulty turning over in bed (including adjusting bedclothes, sheets and blankets)?: None Difficulty moving from lying on back to sitting on the side of the bed? : A Little Difficulty sitting down on and standing up from a chair with arms (e.g., wheelchair, bedside commode, etc,.)?: A Little Help needed moving to and from a bed to chair (including a wheelchair)?: A Little Help needed walking in hospital room?: A Lot Help needed climbing 3-5 steps with a railing? : A Lot 6 Click Score: 17    End of Session Equipment Utilized During Treatment: Gait belt;Oxygen Activity Tolerance: Patient limited by fatigue (Decreased O2 sat) Patient left: in bed;with call bell/phone within reach Nurse Communication: Mobility status (Unsure that roommate will provide 24 hour assist.) PT Visit Diagnosis: Unsteadiness on feet (R26.81);Other abnormalities of gait and mobility (R26.89);Muscle weakness (generalized) (M62.81);Ataxic gait (R26.0)    Time: 1245-8099 PT Time Calculation (min) (ACUTE ONLY): 29 min   Charges:   PT Evaluation $PT Eval Moderate Complexity: 1 Mod PT Treatments $Gait Training: 8-22 mins   PT G Codes:        Carita Pian. Sanjuana Kava, [facility] Acute Rehab Services Pager  [place] 05/04/2017, 4:15 PM

## 2017-05-04 NOTE — Progress Notes (Signed)
Physical Therapy Note   SATURATION QUALIFICATIONS: (This note is used to comply with regulatory documentation for home oxygen)  Patient Saturations on Room Air at Rest = 85%  Patient Saturations on Room Air while Ambulating = na% (did not attempt)  Patient Saturations on 3 Liters of oxygen while Ambulating = 91%  Please briefly explain why patient needs home oxygen:   Patient saturations at 85% on room air at rest.  Carita Pian. Sanjuana Kava, [place] Pager [phone]

## 2017-05-04 NOTE — Progress Notes (Signed)
Pt. Noted to have glucose of 556 at 0544 05/03/17 during lab draw. Night nurse noted coverage added and SSI. However CBG not covered. At beginning of shift I requested nurse tech redraw CBG and CBG at 412. MD notified. Will continue to monitor.

## 2017-05-05 LAB — CULTURE, BLOOD (ROUTINE X 2)
Culture: NO GROWTH
Culture: NO GROWTH
SPECIAL REQUESTS: ADEQUATE

## 2017-05-05 LAB — GLUCOSE, CAPILLARY
Glucose-Capillary: 135 mg/dL — ABNORMAL HIGH (ref 65–99)
Glucose-Capillary: 58 mg/dL — ABNORMAL LOW (ref 65–99)

## 2017-05-05 LAB — BASIC METABOLIC PANEL
Anion gap: 7 (ref 5–15)
BUN: 65 mg/dL — AB (ref 6–20)
CHLORIDE: 102 mmol/L (ref 101–111)
CO2: 25 mmol/L (ref 22–32)
CREATININE: 1.91 mg/dL — AB (ref 0.44–1.00)
Calcium: 9.1 mg/dL (ref 8.9–10.3)
GFR calc Af Amer: 31 mL/min — ABNORMAL LOW (ref >60)
GFR, EST NON AFRICAN AMERICAN: 26 mL/min — AB (ref >60)
Glucose, Bld: 77 mg/dL (ref 65–99)
Potassium: 3.5 mmol/L (ref 3.5–5.1)
SODIUM: 134 mmol/L — AB (ref 135–145)

## 2017-05-05 LAB — EXPECTORATED SPUTUM ASSESSMENT W REFEX TO RESP CULTURE

## 2017-05-05 LAB — EXPECTORATED SPUTUM ASSESSMENT W GRAM STAIN, RFLX TO RESP C

## 2017-05-05 MED ORDER — ALBUTEROL SULFATE HFA 108 (90 BASE) MCG/ACT IN AERS: 1.0000 | INHALATION_SPRAY | Freq: Four times a day (QID) | RESPIRATORY_TRACT | 3 refills | Status: AC | PRN

## 2017-05-05 MED ORDER — CEFPODOXIME PROXETIL 200 MG PO TABS: 200.0000 mg | ORAL_TABLET | Freq: Every day | ORAL | 0 refills | Status: DC

## 2017-05-05 MED ORDER — COLCHICINE 0.6 MG PO TABS: 0.6000 mg | ORAL_TABLET | Freq: Every day | ORAL | 2 refills | Status: DC | PRN

## 2017-05-05 MED ORDER — CEFPODOXIME PROXETIL 200 MG PO TABS
200.0000 mg | ORAL_TABLET | ORAL | Status: DC
  Administered 2017-05-05: 200 mg via ORAL
  Filled 2017-05-05 (×2): qty 1

## 2017-05-05 MED ORDER — DOXYCYCLINE HYCLATE 100 MG PO TABS: 100.0000 mg | ORAL_TABLET | Freq: Two times a day (BID) | ORAL | 0 refills | Status: DC

## 2017-05-05 MED ORDER — TIOTROPIUM BROMIDE MONOHYDRATE 18 MCG IN CAPS: 18.0000 ug | ORAL_CAPSULE | Freq: Every day | RESPIRATORY_TRACT | 3 refills | Status: DC

## 2017-05-05 MED ORDER — ISOSORBIDE MONONITRATE ER 30 MG PO TB24: 30.0000 mg | ORAL_TABLET | Freq: Every day | ORAL | 3 refills | Status: AC

## 2017-05-05 MED ORDER — FUROSEMIDE 40 MG PO TABS
40.0000 mg | ORAL_TABLET | Freq: Two times a day (BID) | ORAL | Status: DC
  Administered 2017-05-05: 40 mg via ORAL
  Filled 2017-05-05: qty 1

## 2017-05-05 MED ORDER — MOMETASONE FURO-FORMOTEROL FUM 100-5 MCG/ACT IN AERO
2.0000 | INHALATION_SPRAY | Freq: Two times a day (BID) | RESPIRATORY_TRACT | Status: DC
  Filled 2017-05-05: qty 8.8

## 2017-05-05 MED ORDER — MOMETASONE FURO-FORMOTEROL FUM 100-5 MCG/ACT IN AERO: 2.0000 | INHALATION_SPRAY | Freq: Two times a day (BID) | RESPIRATORY_TRACT | 2 refills | Status: DC

## 2017-05-05 MED ORDER — FUROSEMIDE 40 MG PO TABS: 40.0000 mg | ORAL_TABLET | Freq: Two times a day (BID) | ORAL | 2 refills | Status: AC

## 2017-05-05 MED ORDER — COLCHICINE 0.6 MG PO TABS: 0.6000 mg | ORAL_TABLET | Freq: Every day | ORAL | 2 refills | Status: AC | PRN

## 2017-05-05 MED ORDER — IPRATROPIUM-ALBUTEROL 0.5-2.5 (3) MG/3ML IN SOLN: 3.0000 mL | Freq: Three times a day (TID) | RESPIRATORY_TRACT | 3 refills | Status: AC

## 2017-05-05 MED ORDER — CEFPODOXIME PROXETIL 200 MG PO TABS: 200.0000 mg | ORAL_TABLET | Freq: Two times a day (BID) | ORAL | 0 refills | Status: DC

## 2017-05-05 MED ORDER — DOXYCYCLINE HYCLATE 100 MG PO TABS: 100.0000 mg | ORAL_TABLET | Freq: Two times a day (BID) | ORAL | Status: DC

## 2017-05-05 NOTE — Care Management Note (Addendum)
Case Management Note  Patient Details  Name: Victoria Burke MRN: [id number] Date of Birth: 04-24-1951  Subjective/Objective:           Presented with acute shortness of breath and respiratory distressCOPD, chronic diastolic CHF, paroxysmal A. fib and chronic kidney disease stage III, CAD.  [name] (roommate) [phone]  PCP: Cammie Sickle  Action/Plan: Plan is to d/c to home today. Declines SNF placement . States will primarily have 24/7 supervision @ home from roommate [name]  [phone], cell # ). Pt states home address : [address] [place], [place], [address]. Pt's cell # [phone].  [name] (roommate) to accompany pt to home, pt without transportation to home. Referral made to CSW.  Expected Discharge Date:  05/05/17               Expected Discharge Plan:  [place]  In-House Referral:     Discharge planning Services  CM Consult  Post Acute Care Choice:    Choice offered to:  Patient  DME Arranged:  3-N-1, Nebulizer machine, Oxygen DME Agency:  [place] made with Butch Penny @ [phone].  3 in 1 and nebulizer will be delivered to pt's home per [facility]. HH Arranged:  RN, PT, OT, Nurse's Aide [place] Agency:  [place], referral made with Butch Penny @ [phone].    Status of Service:  Completed, signed off  If discussed at [place] of Stay Meetings, dates discussed:    Additional Comments:  Sharin Mons, RN 05/05/2017, 10:51 AM

## 2017-05-05 NOTE — Progress Notes (Signed)
Victoria Burke Sawaya to be D/C'd Home per MD order.  Discussed with the patient and all questions fully answered.  VSS, Skin clean, dry and intact without evidence of skin break down, no evidence of skin tears noted. IV catheter discontinued intact. Site without signs and symptoms of complications. Dressing and pressure applied.  An After Visit Summary was printed and given to the patient. Patient received prescription.  D/c education completed with patient/family including follow up instructions, medication list, d/c activities limitations if indicated, with other d/c instructions as indicated by MD - patient able to verbalize understanding, all questions fully answered.   Patient instructed to return to ED, call 911, or call MD for any changes in condition.   Patient escorted via Victoria Burke, and D/C home via private auto.  Victoria Burke 05/05/2017 4:39 pm

## 2017-05-05 NOTE — NC FL2 (Signed)
[place] LEVEL OF CARE SCREENING TOOL     IDENTIFICATION  Patient Name: Victoria Burke Birthdate: May 23, 1951 Sex: female Admission Date (Current Location): 04/29/2017  [facility] and [place] Number:  Herbalist and Address:  [address] [facility] [place] [address], [place], [place] [address]      Provider Number: [id number]  Attending Physician Name and Address:  Mendel Corning, MD  Relative Name and Phone Number:       Current Level of Care: Hospital Recommended Level of Care: [place] Prior Approval Number:    Date Approved/Denied:   PASRR Number:    Discharge Plan: SNF    Current Diagnoses: Patient Active Problem List   Diagnosis Date Noted  . HCAP (healthcare-associated pneumonia) 04/29/2017  . Multifocal pneumonia   . Acute renal failure superimposed on stage 3 chronic kidney disease ([place])   . Acute on chronic diastolic CHF (congestive heart failure) ([place]) 04/06/2017  . CAD (coronary artery disease), native coronary artery 03/23/2017  . Kidney disease, chronic, stage III (GFR 30-59 ml/min) (H[facility]) 03/23/2017  . Hypertensive heart disease 03/23/2017  . History of noncompliance with medical treatment 03/23/2017  . Paroxysmal atrial fibrillation ([place]) 03/23/2017  . Acute respiratory failure with hypoxia ([place]) 02/04/2017  . Recurrent right pleural effusion   . Generalized anxiety disorder 09/28/2015  . Chronic obstructive pulmonary disease ([place])   . Depression 07/20/2015  . Tobacco abuse 07/20/2015  . Cocaine abuse ([place])   . History of breast cancer 11/04/2011    Orientation RESPIRATION BLADDER Height & Weight     Self, Time, Situation, Place  O2 (3L [place]) Continent Weight: 138 lb 12.8 oz (63 kg) Height:  5\' 2"  (157.5 cm)  BEHAVIORAL SYMPTOMS/MOOD NEUROLOGICAL BOWEL NUTRITION STATUS      Continent Diet (cardiac)  AMBULATORY STATUS COMMUNICATION OF NEEDS Skin   Limited Assist Verbally Normal                        Personal Care Assistance Level of Assistance  Bathing, Dressing Bathing Assistance: Limited assistance   Dressing Assistance: Limited assistance     Functional Limitations Info             SPECIAL CARE FACTORS FREQUENCY  PT (By licensed PT), OT (By licensed OT)     PT Frequency: 5/wk OT Frequency: 5/wk            Contractures      Additional Factors Info  Code Status, Allergies, Insulin Sliding Scale Code Status Info: FULL Allergies Info: NKA   Insulin Sliding Scale Info: 8/day       Current Medications (05/05/2017):  This is the current hospital active medication list Current Facility-Administered Medications  Medication Dose Route Frequency Provider Last Rate Last Dose  . 0.9 %  sodium chloride infusion  250 mL Intravenous PRN Opyd, Ilene Qua, MD   Stopped at 05/02/17 1500  . albuterol (PROVENTIL) (2.5 MG/3ML) 0.083% nebulizer solution 2.5 mg  2.5 mg Nebulization Q4H PRN Opyd, Ilene Qua, MD      . amiodarone (PACERONE) tablet 200 mg  200 mg Oral BID Opyd, Ilene Qua, MD   200 mg at 05/05/17 0018  . anastrozole (ARIMIDEX) tablet 1 mg  1 mg Oral Daily Opyd, Ilene Qua, MD   1 mg at 05/04/17 9798  . aspirin chewable tablet 324 mg  324 mg Oral Once Pisciotta, Elmyra Ricks, PA-C   Stopped at 04/29/17 2030  . aspirin  EC tablet 81 mg  81 mg Oral Daily Opyd, Ilene Qua, MD   81 mg at 05/04/17 9024  . busPIRone (BUSPAR) tablet 10 mg  10 mg Oral BID Opyd, Ilene Qua, MD   10 mg at 05/05/17 0018  . ceFEPIme (MAXIPIME) 1 g in dextrose 5 % 50 mL IVPB  1 g Intravenous Q24H Wynell Balloon, RPH 100 mL/hr at 05/05/17 0017 1 g at 05/05/17 0017  . furosemide (LASIX) tablet 40 mg  40 mg Oral Daily Rai, Ripudeep K, MD   40 mg at 05/04/17 0973  . gabapentin (NEURONTIN) capsule 300 mg  300 mg Oral TID Vianne Bulls, MD   300 mg at 05/05/17 0019  . heparin injection 5,000 Units  5,000 Units Subcutaneous Q8H Opyd, Ilene Qua, MD   5,000 Units at 05/05/17 5329  . hydrALAZINE  (APRESOLINE) injection 10 mg  10 mg Intravenous Q4H PRN Opyd, Ilene Qua, MD   10 mg at 05/02/17 1852  . hydrALAZINE (APRESOLINE) tablet 100 mg  100 mg Oral Q8H Opyd, Ilene Qua, MD   100 mg at 05/05/17 9242  . insulin aspart (novoLOG) injection 0-15 Units  0-15 Units Subcutaneous TID WC Rai, Ripudeep K, MD   2 Units at 05/04/17 1723  . insulin aspart (novoLOG) injection 0-5 Units  0-5 Units Subcutaneous QHS Rai, Ripudeep K, MD      . insulin aspart (novoLOG) injection 4 Units  4 Units Subcutaneous TID WC Rai, Ripudeep K, MD   4 Units at 05/04/17 1724  . insulin glargine (LANTUS) injection 5 Units  5 Units Subcutaneous Daily Rai, Ripudeep K, MD   5 Units at 05/04/17 1249  . ipratropium-albuterol (DUONEB) 0.5-2.5 (3) MG/3ML nebulizer solution 3 mL  3 mL Nebulization TID Rai, Ripudeep K, MD   3 mL at 05/05/17 0849  . isosorbide mononitrate (IMDUR) 24 hr tablet 30 mg  30 mg Oral Daily Rai, Ripudeep K, MD   30 mg at 05/04/17 0922  . lidocaine (XYLOCAINE) 2 % injection    PRN Monia Sabal, PA-C   10 mL at 05/02/17 1543  . MEDLINE mouth rinse  15 mL Mouth Rinse BID Opyd, Ilene Qua, MD   15 mL at 05/05/17 0020  . MUSCLE RUB CREA   Topical PRN Rai, Ripudeep K, MD      . sodium chloride flush (NS) 0.9 % injection 3 mL  3 mL Intravenous Q12H Opyd, Ilene Qua, MD   3 mL at 05/05/17 0019  . sodium chloride flush (NS) 0.9 % injection 3 mL  3 mL Intravenous PRN Opyd, Ilene Qua, MD      . traZODone (DESYREL) tablet 25 mg  25 mg Oral QHS PRN Opyd, Ilene Qua, MD   25 mg at 05/02/17 2305     Discharge Medications: Please see discharge summary for a list of discharge medications.  Relevant Imaging Results:  Relevant Lab Results:   Additional Information SS#: [id number]  Jorge Ny, LCSW

## 2017-05-05 NOTE — Progress Notes (Signed)
CSW received consult regarding transportation issues. Patient's son is not able to come pick her up and she is on oxygen. CSW will provide taxi voucher.   CSW signing off.  Percell Locus Terrianne Cavness LCSWA [phone]

## 2017-05-05 NOTE — Progress Notes (Signed)
Discharge orders in, patient is trying to arrange transportation home, and case management is working with her for home needs.

## 2017-05-05 NOTE — Progress Notes (Addendum)
Update: Per RNCM patient declining SNF and discharging home. CSW signing off.  Patient's pasrr under manual review. Patient not able to discharge without a pasrr.  Victoria Burke LCSWA [phone]

## 2017-05-05 NOTE — Discharge Summary (Signed)
Physician Discharge Summary   Patient ID: Victoria Burke MRN: [id number] DOB/AGE: 03/26/1951 66 y.o.  Admit date: 04/29/2017 Discharge date: 05/05/2017  Primary Care Physician:  Dorena Dew, FNP  Discharge Diagnoses:    . Acute respiratory failure with hypoxia ([place]) . Chronic obstructive pulmonary disease (HCC) exacerbation . CAD (coronary artery disease), native coronary artery . Kidney disease, chronic, stage III (GFR 30-59 ml/min) (HCC) . Paroxysmal atrial fibrillation (HCC) . Hyperglycemia secondary to steroids . Generalized anxiety disorder . Acute on chronic diastolic CHF (congestive heart failure) ([place]) . Recurrent right pleural effusion . HCAP (healthcare-associated pneumonia)   Consults:  None   Recommendations for Outpatient Follow-up:  1. Patient declined skilled nursing facility for rehab, case management consulted for home health PT, OT, RN, home health aide. 2. Patient also qualified for home O2 3 L, DME nebs, machine, 3n1 3. Please repeat CBC/BMET at next visit 4. Follow hemoglobin A1c outpatient, 6.4 on 10/28   DIET: Heart healthy diet    Allergies:  No Known Allergies   DISCHARGE MEDICATIONS: Current Discharge Medication List    START taking these medications   Details  cefpodoxime (VANTIN) 200 MG tablet Take 1 tablet (200 mg total) by mouth daily. X 4 days Qty: 4 tablet, Refills: 0    ipratropium-albuterol (DUONEB) 0.5-2.5 (3) MG/3ML SOLN Take 3 mLs by nebulization 3 (three) times daily. Qty: 360 mL, Refills: 3    isosorbide mononitrate (IMDUR) 30 MG 24 hr tablet Take 1 tablet (30 mg total) by mouth daily. Qty: 30 tablet, Refills: 3    mometasone-formoterol (DULERA) 100-5 MCG/ACT AERO Inhale 2 puffs into the lungs 2 (two) times daily. Qty: 2 Inhaler, Refills: 2      CONTINUE these medications which have CHANGED   Details  albuterol (PROVENTIL HFA;VENTOLIN HFA) 108 (90 Base) MCG/ACT inhaler Inhale 1-2 puffs into the lungs every 6  (six) hours as needed for wheezing or shortness of breath. Qty: 3 Inhaler, Refills: 3    colchicine 0.6 MG tablet Take 1 tablet (0.6 mg total) by mouth daily as needed. For gout Qty: 30 tablet, Refills: 2    furosemide (LASIX) 40 MG tablet Take 1 tablet (40 mg total) by mouth 2 (two) times daily. Qty: 60 tablet, Refills: 2    tiotropium (SPIRIVA) 18 MCG inhalation capsule Place 1 capsule (18 mcg total) into inhaler and inhale daily. Qty: 30 capsule, Refills: 3      CONTINUE these medications which have NOT CHANGED   Details  amiodarone (PACERONE) 200 MG tablet Take 1 tablet (200 mg total) by mouth 2 (two) times daily. Qty: 60 tablet, Refills: 1    anastrozole (ARIMIDEX) 1 MG tablet Take 1 tablet (1 mg total) by mouth daily. Qty: 30 tablet, Refills: 5    busPIRone (BUSPAR) 10 MG tablet Take 1 tablet (10 mg total) by mouth 2 (two) times daily. Qty: 60 tablet, Refills: 2   Associated Diagnoses: Generalized anxiety disorder    gabapentin (NEURONTIN) 300 MG capsule Take 1 capsule (300 mg total) by mouth 3 (three) times daily. Qty: 90 capsule, Refills: 0   Associated Diagnoses: Essential hypertension    hydrALAZINE (APRESOLINE) 100 MG tablet Take 1 tablet (100 mg total) by mouth every 8 (eight) hours. Qty: 90 tablet, Refills: 2    traZODone (DESYREL) 50 MG tablet Take 0.5 tablets (25 mg total) by mouth at bedtime as needed for sleep. Qty: 30 tablet, Refills: 0   Associated Diagnoses: Insomnia, unspecified type    aspirin EC 81  MG EC tablet Take 1 tablet (81 mg total) by mouth daily. Qty: 30 tablet, Refills: 1      STOP taking these medications     amLODipine (NORVASC) 10 MG tablet      lisinopril-hydrochlorothiazide (PRINZIDE,ZESTORETIC) 20-12.5 MG tablet      losartan (COZAAR) 25 MG tablet          Brief H and P: For complete details please refer to admission H and P, but in brief Patient is a 66 year old female with COPD, chronic diastolic CHF, paroxysmal A. fib and  chronic kidney disease stage III, CAD presented with acute shortness of breath and respiratory distress.  In ED patient was afebrile, requiring 6 L of O2.  Creatinine at 1.49.  CT angiogram showed no PE, right pleural effusion, multifocal groundglass densities concerning for multifocal pneumonia.  The patient was placed on BiPAP in ED.   Hospital Course:  Acute respiratory failure with hypoxia (HCC) - Multifactorial related acute CHF exacerbation, multifocal pneumonia, pleural effusion, anemia - BNP on admission was 1500, increased right-sided pleural effusion, chest x-ray with multifocal densities -Underwent thoracentesis, 420 cc fluid removed, cultures negative so far    Chronic obstructive pulmonary disease (HCC) exacerbation, HCAP/multifocal pneumonia -Patient was placed on BiPAP at the time of admission, has been transitioned to O2 via nasal cannula.   -Transition to oral Ceftin, patient was placed on IV steroids and prednisone however due to excessive hyperglycemia, it was discontinued she is not wheezing at the time of discharge.   -Patient was placed on nebs, Spiriva, Symbicort, DME nebs machine was arranged by the case management    Acute on chronic diastolic CHF (congestive heart failure) ([place]), elevated troponins, underlying history of CAD -Recent echo 10/1 with EF of 65-70% with grade 2 diastolic dysfunction, currently no chest pain -Creatinine improving, restarted oral Lasix 40 mg daily, increase to home dose of Lasix 40 mg twice a day. - Continue to hold amlodipine, losartan   Hyperglycemia: Likely steroid-induced -Hemoglobin A1c 6.4, placed on Lantus 5 units, NovoLog meal coverage and sliding scale insulin -Prednisone discontinued, CBC is improving.    Generalized anxiety disorder -Currently stable, cont buspar     Recurrent right pleural effusion -Status post thoracentesis, 420 cc fluid removed, follow labs, cultures    Chronic Kidney disease, chronic, stage III (GFR  30-59 ml/min) (HCC) -Baseline creatinine ~1.9 -Creatinine improving, continue Lasix daily    Paroxysmal atrial fibrillation (HCC) -Stable, continue amiodarone - CHADSvasc 5 not on anticoagulation due to poor compliance  Day of Discharge BP (!) 164/67   Pulse 72   Temp 97.9 F (36.6 C) (Oral)   Resp 18   Ht 5\' 2"  (1.575 m)   Wt 63 kg (138 lb 12.8 oz)   SpO2 98%   BMI 25.39 kg/m   Physical Exam: General: Alert and awake oriented x3 not in any acute distress. HEENT: anicteric sclera, pupils reactive to light and accommodation CVS: S1-S2 clear no murmur rubs or gallops Chest: clear to auscultation bilaterally, no wheezing rales or rhonchi Abdomen: soft nontender, nondistended, normal bowel sounds Extremities: no cyanosis, clubbing or edema noted bilaterally Neuro: Cranial nerves II-XII intact, no focal neurological deficits   The results of significant diagnostics from this hospitalization (including imaging, microbiology, ancillary and laboratory) are listed below for reference.    LAB RESULTS: Basic Metabolic Panel:  Recent Labs Lab 05/04/17 0158 05/05/17 0649  NA 128* 134*  K 4.5 3.5  CL 96* 102  CO2 20* 25  GLUCOSE 556*  77  BUN 71* 65*  CREATININE 2.25* 1.91*  CALCIUM 8.9 9.1   Liver Function Tests:  Recent Labs Lab 04/29/17 1815  AST 20  ALT 12*  ALKPHOS 106  BILITOT 0.6  PROT 7.7  ALBUMIN 2.7*   No results for input(s): LIPASE, AMYLASE in the last 168 hours. No results for input(s): AMMONIA in the last 168 hours. CBC:  Recent Labs Lab 04/29/17 2356  05/02/17 0315 05/03/17 0544  WBC 7.4  < > 11.9* 12.2*  NEUTROABS 7.2  --   --   --   HGB 12.7  < > 11.8* 11.8*  HCT 39.9  < > 38.6 38.2  MCV 89.1  < > 90.4 89.5  PLT 323  < > 397 381  < > = values in this interval not displayed. Cardiac Enzymes:  Recent Labs Lab 04/30/17 0740 04/30/17 1402  TROPONINI 0.05* 0.04*   BNP: Invalid input(s): POCBNP CBG:  Recent Labs Lab 05/04/17 2303  05/05/17 0843  GLUCAP 130* 135*    Significant Diagnostic Studies:  Ct Angio Chest Pe W And/or Wo Contrast  Result Date: 04/29/2017 CLINICAL DATA:  Shortness of breath with abnormal biomarkers EXAM: CT ANGIOGRAPHY CHEST WITH CONTRAST TECHNIQUE: Multidetector CT imaging of the chest was performed using the standard protocol during bolus administration of intravenous contrast. Multiplanar CT image reconstructions and MIPs were obtained to evaluate the vascular anatomy. CONTRAST:  75 mL Isovue 370 intravenous COMPARISON:  Radiograph 04/29/2017, CT chest 02/04/2017, 10/15/2016 FINDINGS: Cardiovascular: Technologist inadvertently did not completely image the bilateral lung bases. Satisfactory opacification of the pulmonary arterial system to the segmental level. No central embolus is visualized. Heavily calcified aorta without aneurysmal dilatation. Coronary artery calcification. Cardiomegaly. No large pericardial effusion. Mediastinum/Nodes: Midline trachea. No thyroid mass. Increased mediastinal adenopathy, for example in 18 mm lymph node adjacent to the aortic arch. 16 mm right precarinal lymph node. 16 mm lymph node above the aortic arch. Esophagus is within normal limits. Lungs/Pleura: Small left pleural effusion. Small moderate partially loculated right pleural effusion. Mild emphysema. Development of multifocal ground-glass densities within the upper lobes with worsened consolidation in the right lower lobe. Negative for a pneumothorax. Upper Abdomen: Inadequately visualized. Musculoskeletal: Patchy foci of sclerosis in the sternum and vertebral bodies. No change from prior. Review of the MIP images confirms the above findings. IMPRESSION: 1. Incompletely imaged lung bases. Allowing for this, no definite acute central embolus is visualized. 2. Bilateral pleural effusions, small on the left and small moderate on the right with mild loculation of the right pleural effusion. Right pleural effusion is  increased compared to the most recent prior chest CT. Development of multifocal ground-glass densities within the upper lobes with progressed consolidation in the right lower lobe possibly due to multifocal pneumonia. Underlying emphysema. 3. Continued moderate mediastinal adenopathy, similar to most recent prior CT but progressed since 2017. 4. Cardiomegaly Aortic Atherosclerosis (ICD10-I70.0) and Emphysema (ICD10-J43.9). Electronically Signed   By: Donavan Foil M.D.   On: 04/29/2017 22:05   Dg Chest Port 1 View  Result Date: 04/29/2017 CLINICAL DATA:  Onset of shortness of breath today. EXAM: PORTABLE CHEST 1 VIEW COMPARISON:  Single-view of the chest 04/09/2017. PA and lateral chest 04/06/2017 02/03/2017. CT chest 02/04/2017. FINDINGS: The lungs are emphysematous. Small to moderate pleural effusions, larger on the right, persist without marked change. Associated basilar airspace disease is seen. There is interstitial pulmonary edema. Cardiomegaly noted. No pneumothorax. Atherosclerosis is noted. IMPRESSION: No marked change in pulmonary edema with right greater than  left pleural effusions and basilar airspace disease since the most recent examination. Cardiomegaly. Atherosclerosis. Electronically Signed   By: Inge Rise M.D.   On: 04/29/2017 18:59   Ir US Chest  Result Date: 04/30/2017 CLINICAL DATA:  PLEURAL EFFUSIONS, PNEUMONIA EXAM: CHEST ULTRASOUND COMPARISON:  04/29/2017 CT FINDINGS: limited ultrasound of the chest demonstrates a large right pleural effusion and a very small left pleural effusion. this correlates with the previous chest CT. After discussion with the patient, she wishes to not have the procedure at this time. IMPRESSION: Right greater than left pleural effusions. Right effusion is amenable to thoracentesis. See above comment. Procedure not performed. Electronically Signed   By: Jerilynn Mages.  Shick M.D.   On: 04/30/2017 12:57    2D ECHO: Study Conclusions  - Left ventricle: The  cavity size was normal. There was moderate   concentric hypertrophy. Systolic function was vigorous. The   estimated ejection fraction was in the range of 65% to 70%. Wall   motion was normal; there were no regional wall motion   abnormalities. Features are consistent with a pseudonormal left   ventricular filling pattern, with concomitant abnormal relaxation   and increased filling pressure (grade 2 diastolic dysfunction).  Disposition and Follow-up: Discharge Instructions    (HEART FAILURE PATIENTS) Call MD:  Anytime you have any of the following symptoms: 1) 3 pound weight gain in 24 hours or 5 pounds in 1 week 2) shortness of breath, with or without a dry hacking cough 3) swelling in the hands, feet or stomach 4) if you have to sleep on extra pillows at night in order to breathe.    Complete by:  As directed    Diet - low sodium heart healthy    Complete by:  As directed    Increase activity slowly    Complete by:  As directed        DISPOSITION: Home with home health, patient declined skilled nursing facility   [place], [place], FNP. Go on 05/09/2017.   Specialty:  Family Medicine Why:   post hospital follow up scheduled for 05/09/2017 at 2:30 pm with Cammie Sickle NP Contact information: [address] [place] [address] [place], [place], NP Follow up on 05/12/2017.   Specialties:  Nurse Practitioner, Interventional Cardiology, Cardiology, Radiology Why:  at 11:00 AM, need labs for kidney function Contact information: [place]. [address] [place] [address] [phone]        [facility], [facility] Follow up.   Why:  home health services arranged, office will call and set up home visits Contact information: [address] [place] [address] [place] Follow up.   Why:  3 in1 / BSC and Nebulizer will be  delivered to bedside prior to discharge. Home oxygen arranged. Portable oxygen tank will be delivered to beside prior to discharge to assist with transportation to home. Contact information: [address] [place] [address] [phone]            Time spent on Discharge: 7mins   Signed:   Estill Cotta M.D. [facility] 05/05/2017, 11:36 AM Pager: [phone]

## 2017-05-05 NOTE — Progress Notes (Signed)
Juice given

## 2017-05-07 LAB — CULTURE, BODY FLUID W GRAM STAIN -BOTTLE: Culture: NO GROWTH

## 2017-05-09 ENCOUNTER — Ambulatory Visit: Payer: Medicare Other | Admitting: Family Medicine

## 2017-05-10 ENCOUNTER — Telehealth: Payer: Self-pay

## 2017-05-10 NOTE — Telephone Encounter (Signed)
Called and left a message per 10/30 inbasket for patient to call the breast center to make her mammo and dexa scan appt that was convenient to her schedule.  Her f/u was made here with  md and lab and she was advised to call with any questions   Olita Takeshita

## 2017-05-12 ENCOUNTER — Ambulatory Visit: Payer: Medicare Other | Admitting: Nurse Practitioner
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
## 2017-05-13 ENCOUNTER — Encounter: Payer: Self-pay | Admitting: Nurse Practitioner

## 2017-05-13 LAB — CULTURE, RESPIRATORY: CULTURE: NORMAL

## 2017-05-13 LAB — CULTURE, RESPIRATORY W GRAM STAIN

## 2017-05-26 ENCOUNTER — Ambulatory Visit: Payer: Medicare Other | Admitting: Family Medicine

## 2017-06-04 ENCOUNTER — Ambulatory Visit: Payer: Medicare Other | Admitting: Family Medicine

## 2017-06-05 ENCOUNTER — Other Ambulatory Visit: Payer: Medicare Other

## 2017-06-05 ENCOUNTER — Ambulatory Visit: Payer: Medicare Other | Admitting: Hematology

## 2017-06-09 ENCOUNTER — Telehealth: Payer: Self-pay | Admitting: Hematology

## 2017-06-09 NOTE — Telephone Encounter (Signed)
Scheduled appt per 11/29 los - son is aware of appts and will let patient know.

## 2017-06-12 ENCOUNTER — Other Ambulatory Visit: Payer: Medicare Other

## 2017-06-12 ENCOUNTER — Ambulatory Visit: Payer: Medicare Other | Admitting: Hematology

## 2017-07-09 ENCOUNTER — Ambulatory Visit (INDEPENDENT_AMBULATORY_CARE_PROVIDER_SITE_OTHER): Payer: Medicare Other | Admitting: Family Medicine

## 2017-07-09 ENCOUNTER — Encounter: Payer: Self-pay | Admitting: Family Medicine

## 2017-07-09 ENCOUNTER — Ambulatory Visit: Payer: Medicare Other | Admitting: Family Medicine

## 2017-07-09 VITALS — BP 162/80 | HR 84 | Temp 98.1°F | Resp 22 | Ht 62.0 in | Wt 116.0 lb

## 2017-07-09 DIAGNOSIS — F32A Depression, unspecified: Secondary | ICD-10-CM

## 2017-07-09 DIAGNOSIS — I5030 Unspecified diastolic (congestive) heart failure: Secondary | ICD-10-CM | POA: Diagnosis not present

## 2017-07-09 DIAGNOSIS — I110 Hypertensive heart disease with heart failure: Secondary | ICD-10-CM | POA: Diagnosis not present

## 2017-07-09 DIAGNOSIS — R079 Chest pain, unspecified: Secondary | ICD-10-CM

## 2017-07-09 DIAGNOSIS — N183 Chronic kidney disease, stage 3 unspecified: Secondary | ICD-10-CM

## 2017-07-09 DIAGNOSIS — Z91199 Patient's noncompliance with other medical treatment and regimen due to unspecified reason: Secondary | ICD-10-CM

## 2017-07-09 DIAGNOSIS — Z9119 Patient's noncompliance with other medical treatment and regimen: Secondary | ICD-10-CM

## 2017-07-09 DIAGNOSIS — F329 Major depressive disorder, single episode, unspecified: Secondary | ICD-10-CM

## 2017-07-09 DIAGNOSIS — J449 Chronic obstructive pulmonary disease, unspecified: Secondary | ICD-10-CM | POA: Diagnosis not present

## 2017-07-09 DIAGNOSIS — F1410 Cocaine abuse, uncomplicated: Secondary | ICD-10-CM

## 2017-07-09 DIAGNOSIS — Z720 Tobacco use: Secondary | ICD-10-CM

## 2017-07-09 DIAGNOSIS — F411 Generalized anxiety disorder: Secondary | ICD-10-CM

## 2017-07-09 DIAGNOSIS — Z853 Personal history of malignant neoplasm of breast: Secondary | ICD-10-CM | POA: Diagnosis not present

## 2017-07-09 LAB — POCT URINALYSIS DIP (DEVICE)
Bilirubin Urine: NEGATIVE
GLUCOSE, UA: NEGATIVE mg/dL
Ketones, ur: NEGATIVE mg/dL
Leukocytes, UA: NEGATIVE
NITRITE: NEGATIVE
SPECIFIC GRAVITY, URINE: 1.02 (ref 1.005–1.030)
Urobilinogen, UA: 0.2 mg/dL (ref 0.0–1.0)
pH: 7 (ref 5.0–8.0)

## 2017-07-09 MED ORDER — MOMETASONE FURO-FORMOTEROL FUM 100-5 MCG/ACT IN AERO: 2.0000 | INHALATION_SPRAY | Freq: Two times a day (BID) | RESPIRATORY_TRACT | 2 refills | Status: AC

## 2017-07-09 MED ORDER — TIOTROPIUM BROMIDE MONOHYDRATE 18 MCG IN CAPS: 18.0000 ug | ORAL_CAPSULE | Freq: Every day | RESPIRATORY_TRACT | 5 refills | Status: AC

## 2017-07-09 MED ORDER — IPRATROPIUM BROMIDE 0.02 % IN SOLN
0.5000 mg | Freq: Once | RESPIRATORY_TRACT | Status: AC
  Administered 2017-07-09: 0.5 mg via RESPIRATORY_TRACT

## 2017-07-09 MED ORDER — ALBUTEROL SULFATE (2.5 MG/3ML) 0.083% IN NEBU
2.5000 mg | INHALATION_SOLUTION | Freq: Once | RESPIRATORY_TRACT | Status: AC
  Administered 2017-07-09: 2.5 mg via RESPIRATORY_TRACT
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
## 2017-07-09 NOTE — Patient Instructions (Addendum)
Will review chest xray results and follow up by phone. Continue inhalers as scheduled. Discussed the importance of taking medications consistently.  Also, sent a referral to pulmonology.   Scheduled appointment with Dr. Marlou Porch Cecilie Kicks) on July 24, 2017 at 8 am.   Continue smoking and alcohol cessation.    [facility] [place]     Chronic Obstructive Pulmonary Disease Chronic obstructive pulmonary disease (COPD) is a long-term (chronic) lung problem. When you have COPD, it is hard for air to get in and out of your lungs. The way your lungs work will never return to normal. Usually the condition gets worse over time. There are things you can do to keep yourself as healthy as possible. Your doctor may treat your condition with:  Medicines.  Quitting smoking, if you smoke.  Rehabilitation. This may involve a team of specialists.  Oxygen.  Exercise and changes to your diet.  Lung surgery.  Comfort measures (palliative care).  Follow these instructions at home: Medicines  Take over-the-counter and prescription medicines only as told by your doctor.  Talk to your doctor before taking any cough or allergy medicines. You may need to avoid medicines that cause your lungs to be dry. Lifestyle  If you smoke, stop. Smoking makes the problem worse. If you need help quitting, ask your doctor.  Avoid being around things that make your breathing worse. This may include smoke, chemicals, and fumes.  Stay active, but remember to also rest.  Learn and use tips on how to relax.  Make sure you get enough sleep. Most adults need at least 7 hours a night.  Eat healthy foods. Eat smaller meals more often. Rest before meals. Controlled breathing  Learn and use tips on how to control your breathing as told by your doctor. Try: ? Breathing in (inhaling) through your nose for 1 second. Then, pucker your lips and breath out  (exhale) through your lips for 2 seconds. ? Putting one hand on your belly (abdomen). Breathe in slowly through your nose for 1 second. Your hand on your belly should move out. Pucker your lips and breathe out slowly through your lips. Your hand on your belly should move in as you breathe out. Controlled coughing  Learn and use controlled coughing to clear mucus from your lungs. The steps are: 1. Lean your head a little forward. 2. Breathe in deeply. 3. Try to hold your breath for 3 seconds. 4. Keep your mouth slightly open while coughing 2 times. 5. Spit any mucus out into a tissue. 6. Rest and do the steps again 1 or 2 times as needed. General instructions  Make sure you get all the shots (vaccines) that your doctor recommends. Ask your doctor about a flu shot and a pneumonia shot.  Use oxygen therapy and therapy to help improve your lungs (pulmonary rehabilitation) if told by your doctor. If you need home oxygen therapy, ask your doctor if you should buy a tool to measure your oxygen level (oximeter).  Make a COPD action plan with your doctor. This helps you know what to do if you feel worse than usual.  Manage any other conditions you have as told by your doctor.  Avoid going outside when it is very hot, cold, or humid.  Avoid people who have a sickness you can catch (contagious).  Keep all follow-up visits as told by your doctor. This is important. Contact a doctor if:  You cough up more mucus than  usual.  There is a change in the color or thickness of the mucus.  It is harder to breathe than usual.  Your breathing is faster than usual.  You have trouble sleeping.  You need to use your medicines more often than usual.  You have trouble doing your normal activities such as getting dressed or walking around the house. Get help right away if:  You have shortness of breath while resting.  You have shortness of breath that stops you from: ? Being able to talk. ? Doing  normal activities.  Your chest hurts for longer than 5 minutes.  Your skin color is more blue than usual.  Your pulse oximeter shows that you have low oxygen for longer than 5 minutes.  You have a fever.  You feel too tired to breathe normally. Summary  Chronic obstructive pulmonary disease (COPD) is a long-term lung problem.  The way your lungs work will never return to normal. Usually the condition gets worse over time. There are things you can do to keep yourself as healthy as possible.  Take over-the-counter and prescription medicines only as told by your doctor.  If you smoke, stop. Smoking makes the problem worse. This information is not intended to replace advice given to you by your health care provider. Make sure you discuss any questions you have with your health care provider. Document Released: 12/11/2007 Document Revised: 11/30/2015 Document Reviewed: 02/18/2013 Elsevier Interactive Patient Education  2017 [employer].  Hypertension Hypertension, commonly called high blood pressure, is when the force of blood pumping through the arteries is too strong. The arteries are the blood vessels that carry blood from the heart throughout the body. Hypertension forces the heart to work harder to pump blood and may cause arteries to become narrow or stiff. Having untreated or uncontrolled hypertension can cause heart attacks, strokes, kidney disease, and other problems. A blood pressure reading consists of a higher number over a lower number. Ideally, your blood pressure should be below 120/80. The first ("top") number is called the systolic pressure. It is a measure of the pressure in your arteries as your heart beats. The second ("bottom") number is called the diastolic pressure. It is a measure of the pressure in your arteries as the heart relaxes. What are the causes? The cause of this condition is not known. What increases the risk? Some risk factors for high blood pressure are  under your control. Others are not. Factors you can change  Smoking.  Having type 2 diabetes mellitus, high cholesterol, or both.  Not getting enough exercise or physical activity.  Being overweight.  Having too much fat, sugar, calories, or salt (sodium) in your diet.  Drinking too much alcohol. Factors that are difficult or impossible to change  Having chronic kidney disease.  Having a family history of high blood pressure.  Age. Risk increases with age.  Race. You may be at higher risk if you are African-American.  Gender. Men are at higher risk than women before age 10. After age 38, women are at higher risk than men.  Having obstructive sleep apnea.  Stress. What are the signs or symptoms? Extremely high blood pressure (hypertensive crisis) may cause:  Headache.  Anxiety.  Shortness of breath.  Nosebleed.  Nausea and vomiting.  Severe chest pain.  Jerky movements you cannot control (seizures).  How is this diagnosed? This condition is diagnosed by measuring your blood pressure while you are seated, with your arm resting on a surface. The cuff  of the blood pressure monitor will be placed directly against the skin of your upper arm at the level of your heart. It should be measured at least twice using the same arm. Certain conditions can cause a difference in blood pressure between your right and left arms. Certain factors can cause blood pressure readings to be lower or higher than normal (elevated) for a short period of time:  When your blood pressure is higher when you are in a health care provider's office than when you are at home, this is called white coat hypertension. Most people with this condition do not need medicines.  When your blood pressure is higher at home than when you are in a health care provider's office, this is called masked hypertension. Most people with this condition may need medicines to control blood pressure.  If you have a high  blood pressure reading during one visit or you have normal blood pressure with other risk factors:  You may be asked to return on a different day to have your blood pressure checked again.  You may be asked to monitor your blood pressure at home for 1 week or longer.  If you are diagnosed with hypertension, you may have other blood or imaging tests to help your health care provider understand your overall risk for other conditions. How is this treated? This condition is treated by making healthy lifestyle changes, such as eating healthy foods, exercising more, and reducing your alcohol intake. Your health care provider may prescribe medicine if lifestyle changes are not enough to get your blood pressure under control, and if:  Your systolic blood pressure is above 130.  Your diastolic blood pressure is above 80.  Your personal target blood pressure may vary depending on your medical conditions, your age, and other factors. Follow these instructions at home: Eating and drinking  Eat a diet that is high in fiber and potassium, and low in sodium, added sugar, and fat. An example eating plan is called the DASH (Dietary Approaches to Stop Hypertension) diet. To eat this way: ? Eat plenty of fresh fruits and vegetables. Try to fill half of your plate at each meal with fruits and vegetables. ? Eat whole grains, such as whole wheat pasta, brown rice, or whole grain bread. Fill about one quarter of your plate with whole grains. ? Eat or drink low-fat dairy products, such as skim milk or low-fat yogurt. ? Avoid fatty cuts of meat, processed or cured meats, and poultry with skin. Fill about one quarter of your plate with lean proteins, such as fish, chicken without skin, beans, eggs, and tofu. ? Avoid premade and processed foods. These tend to be higher in sodium, added sugar, and fat.  Reduce your daily sodium intake. Most people with hypertension should eat less than 1,500 mg of sodium a  day.  Limit alcohol intake to no more than 1 drink a day for nonpregnant women and 2 drinks a day for men. One drink equals 12 oz of beer, 5 oz of wine, or 1 oz of hard liquor. Lifestyle  Work with your health care provider to maintain a healthy body weight or to lose weight. Ask what an ideal weight is for you.  Get at least 30 minutes of exercise that causes your heart to beat faster (aerobic exercise) most days of the week. Activities may include walking, swimming, or biking.  Include exercise to strengthen your muscles (resistance exercise), such as pilates or lifting weights, as part of your  weekly exercise routine. Try to do these types of exercises for 30 minutes at least 3 days a week.  Do not use any products that contain nicotine or tobacco, such as cigarettes and e-cigarettes. If you need help quitting, ask your health care provider.  Monitor your blood pressure at home as told by your health care provider.  Keep all follow-up visits as told by your health care provider. This is important. Medicines  Take over-the-counter and prescription medicines only as told by your health care provider. Follow directions carefully. Blood pressure medicines must be taken as prescribed.  Do not skip doses of blood pressure medicine. Doing this puts you at risk for problems and can make the medicine less effective.  Ask your health care provider about side effects or reactions to medicines that you should watch for. Contact a health care provider if:  You think you are having a reaction to a medicine you are taking.  You have headaches that keep coming back (recurring).  You feel dizzy.  You have swelling in your ankles.  You have trouble with your vision. Get help right away if:  You develop a severe headache or confusion.  You have unusual weakness or numbness.  You feel faint.  You have severe pain in your chest or abdomen.  You vomit repeatedly.  You have trouble  breathing. Summary  Hypertension is when the force of blood pumping through your arteries is too strong. If this condition is not controlled, it may put you at risk for serious complications.  Your personal target blood pressure may vary depending on your medical conditions, your age, and other factors. For most people, a normal blood pressure is less than 120/80.  Hypertension is treated with lifestyle changes, medicines, or a combination of both. Lifestyle changes include weight loss, eating a healthy, low-sodium diet, exercising more, and limiting alcohol. This information is not intended to replace advice given to you by your health care provider. Make sure you discuss any questions you have with your health care provider. Document Released: 06/24/2005 Document Revised: 05/22/2016 Document Reviewed: 05/22/2016 Elsevier Interactive Patient Education  [employer].

## 2017-07-10 LAB — CMP AND LIVER
ALBUMIN: 3.1 g/dL — AB (ref 3.6–4.8)
ALK PHOS: 116 IU/L (ref 39–117)
ALT: 32 IU/L (ref 0–32)
AST: 39 IU/L (ref 0–40)
BUN: 29 mg/dL — AB (ref 8–27)
Bilirubin Total: <0.2 mg/dL (ref 0.0–1.2)
Bilirubin, Direct: 0.06 mg/dL (ref 0.00–0.40)
CO2: 24 mmol/L (ref 20–29)
CREATININE: 1.26 mg/dL — AB (ref 0.57–1.00)
Calcium: 9.8 mg/dL (ref 8.7–10.3)
Chloride: 100 mmol/L (ref 96–106)
GFR calc Af Amer: 52 mL/min/{1.73_m2} — ABNORMAL LOW (ref >59)
GFR, EST NON AFRICAN AMERICAN: 45 mL/min/{1.73_m2} — AB (ref >59)
GLUCOSE: 107 mg/dL — AB (ref 65–99)
Potassium: 3.8 mmol/L (ref 3.5–5.2)
Sodium: 141 mmol/L (ref 134–144)
Total Protein: 7.6 g/dL (ref 6.0–8.5)

## 2017-07-18 ENCOUNTER — Telehealth: Payer: Self-pay

## 2017-07-18 NOTE — Telephone Encounter (Signed)
-----   Message from Victoria Burke, [place] sent at 07/17/2017  4:57 PM EST ----- Please inform patient that creatinine level is improved.  We will continue to monitor every 3 months.  Follow-up in cardiology as scheduled.  No medication changes warranted.

## 2017-07-18 NOTE — Telephone Encounter (Signed)
Called and informed patient of improvement in creatine levels and that we will continue to monitor every 3 months, asked that she keep her next scheduled appointment and follow up with cardiology also. Patient verbalized understanding. Thanks!
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
## 2017-07-24 ENCOUNTER — Encounter: Payer: Self-pay | Admitting: Cardiology

## 2017-07-24 ENCOUNTER — Ambulatory Visit (INDEPENDENT_AMBULATORY_CARE_PROVIDER_SITE_OTHER): Payer: Medicare Other | Admitting: Cardiology

## 2017-07-24 VITALS — BP 152/80 | HR 60 | Ht 62.0 in | Wt 116.8 lb

## 2017-07-24 DIAGNOSIS — F1920 Other psychoactive substance dependence, uncomplicated: Secondary | ICD-10-CM

## 2017-07-24 DIAGNOSIS — I480 Paroxysmal atrial fibrillation: Secondary | ICD-10-CM

## 2017-07-24 DIAGNOSIS — R079 Chest pain, unspecified: Secondary | ICD-10-CM

## 2017-07-24 DIAGNOSIS — J410 Simple chronic bronchitis: Secondary | ICD-10-CM

## 2017-07-24 NOTE — Patient Instructions (Addendum)
Medication Instructions:  1. Your physician recommends that you continue on your current medications as directed. Please refer to the Current Medication list given to you today.   Labwork: NONE ORDERED TODAY  Testing/Procedures: NONE ORDERED TODAY  Follow-Up: DR. Marlou Porch ON 11/13/17 @ 1:20 PM   Any Other Special Instructions Will Be Listed Below (If Applicable).     If you need a refill on your cardiac medications before your next appointment, please call your pharmacy.

## 2017-08-11 ENCOUNTER — Ambulatory Visit: Payer: Medicare Other | Admitting: Family Medicine

## 2017-08-29 ENCOUNTER — Telehealth: Payer: Self-pay

## 2017-08-29 DIAGNOSIS — F411 Generalized anxiety disorder: Secondary | ICD-10-CM

## 2017-08-29 MED ORDER — BUSPIRONE HCL 10 MG PO TABS: 10.0000 mg | ORAL_TABLET | Freq: Two times a day (BID) | ORAL | 2 refills | Status: AC

## 2017-08-29 NOTE — Telephone Encounter (Signed)
Refill for buspirone sent into pharmacy. Thanks!

## 2017-10-20 ENCOUNTER — Other Ambulatory Visit: Payer: Self-pay | Admitting: Nurse Practitioner

## 2017-10-20 ENCOUNTER — Encounter: Payer: Self-pay | Admitting: Nurse Practitioner

## 2017-10-20 ENCOUNTER — Telehealth: Payer: Self-pay | Admitting: Nurse Practitioner

## 2017-10-20 DIAGNOSIS — Z170 Estrogen receptor positive status [ER+]: Principal | ICD-10-CM

## 2017-10-20 DIAGNOSIS — C50412 Malignant neoplasm of upper-outer quadrant of left female breast: Secondary | ICD-10-CM

## 2017-10-20 MED ORDER — ANASTROZOLE 1 MG PO TABS: 1.0000 mg | ORAL_TABLET | Freq: Every day | ORAL | 3 refills | Status: AC

## 2017-10-20 NOTE — Telephone Encounter (Signed)
I called patient to f/u on multiple no show for previous appointments. She was aware of appointments but "must have forgot." She currently resides at [facility] in [place] and would like her care transferred to oncology near her. They help provide transportation but not sure if they will bring her to [place]. Has been out of Anastrozole for 1 month. Had recent outside mammogram on 10/09/17 that shows diffuse soft tissue edema with asymmetric enlargement of the right breast felt to be secondary to anasarca, no signs of malignancy or significant change. Will scan into our system to be available for subsequent provider. I reviewed with Dr. Burr Medico, will refill Anastrozole for few months to allow for her to establish with new oncologist. I have referred her today.  Cira Rue, NP

## 2017-10-29 ENCOUNTER — Encounter: Payer: Self-pay | Admitting: Cardiology

## 2017-11-07 IMAGING — CR DG CHEST 2V
2 series · 2 of 2 positions shown · non-contrast
Comparison: 09/20/2015.

CLINICAL DATA: Shortness of breath.

EXAM:
CHEST  2 VIEW

[w chest lat]
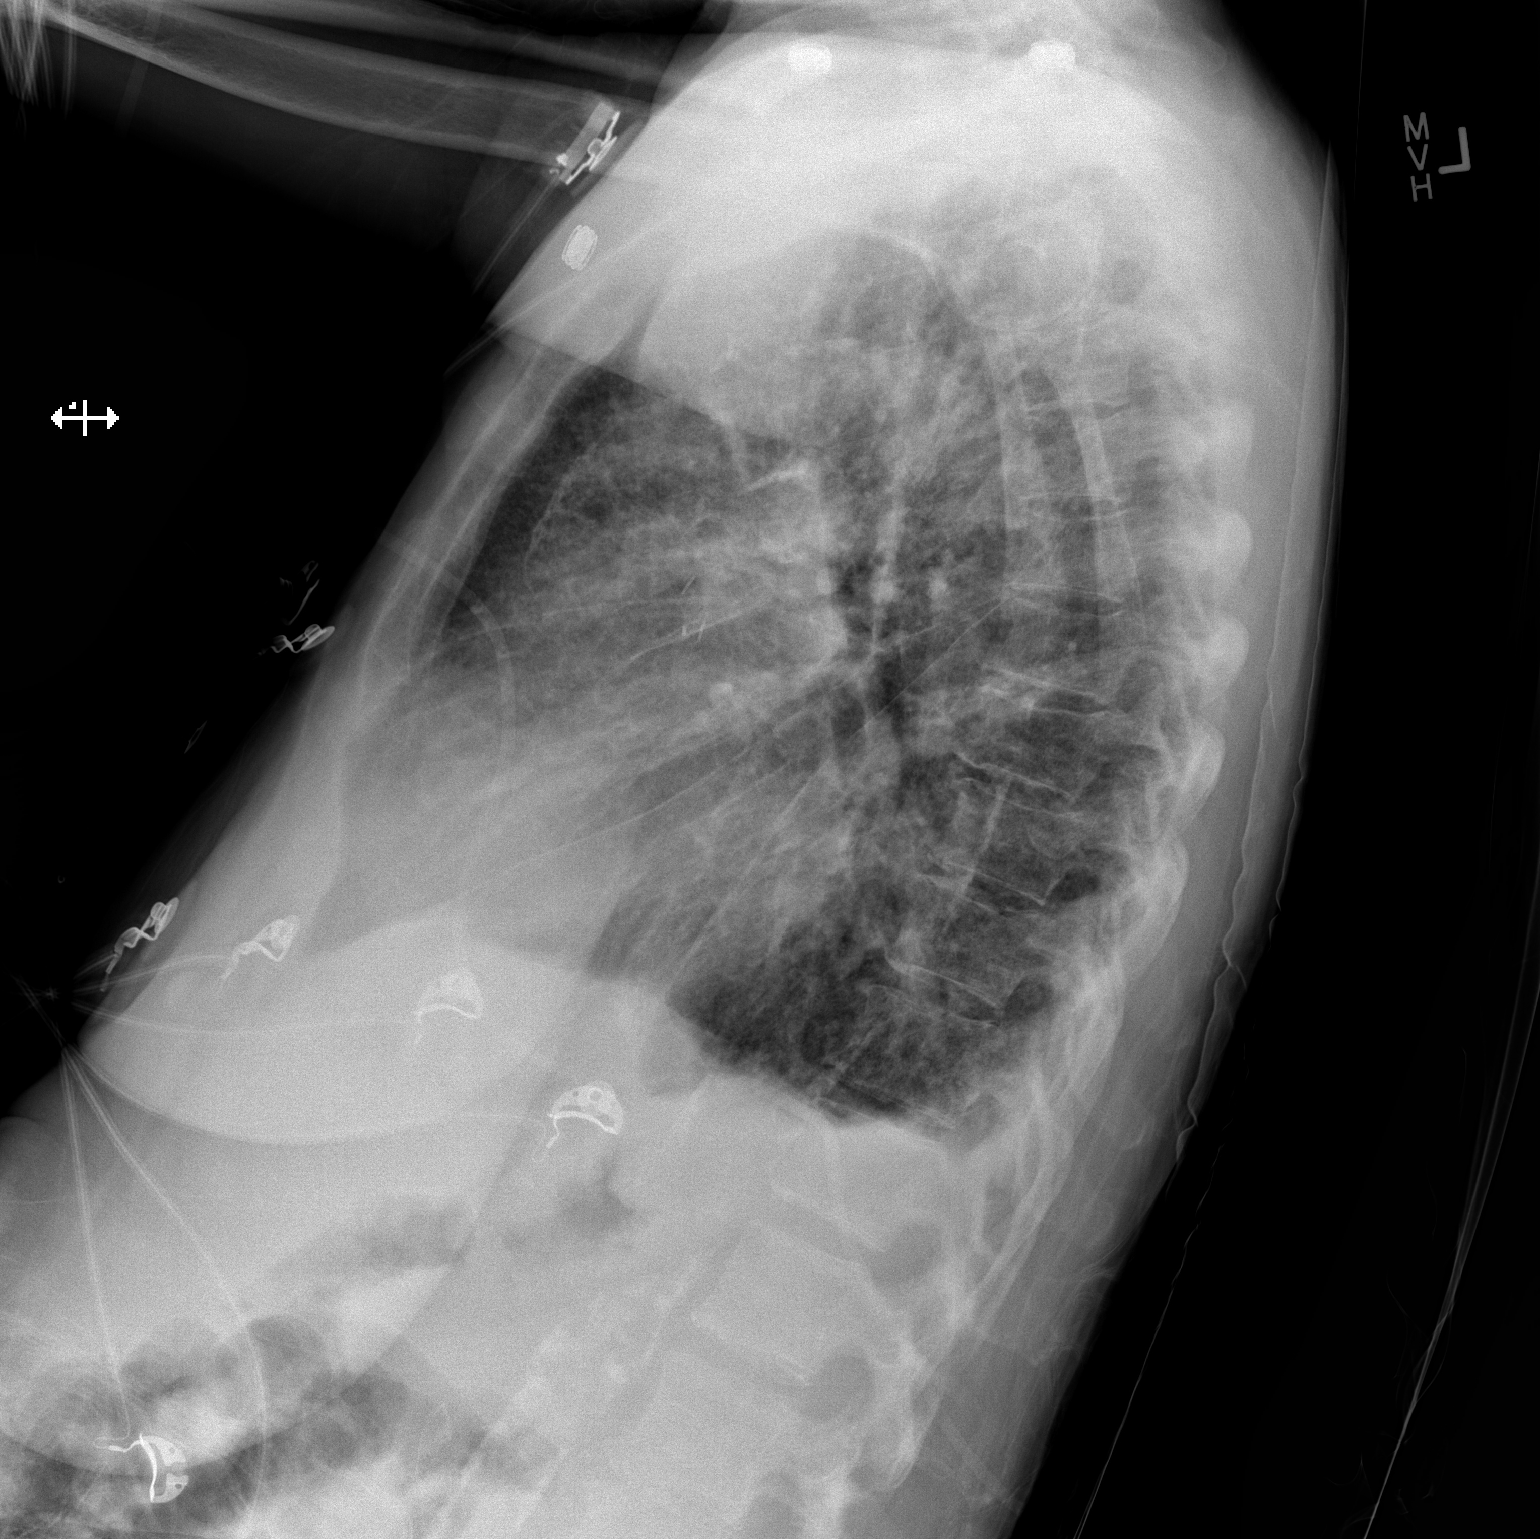

[x chest ap]
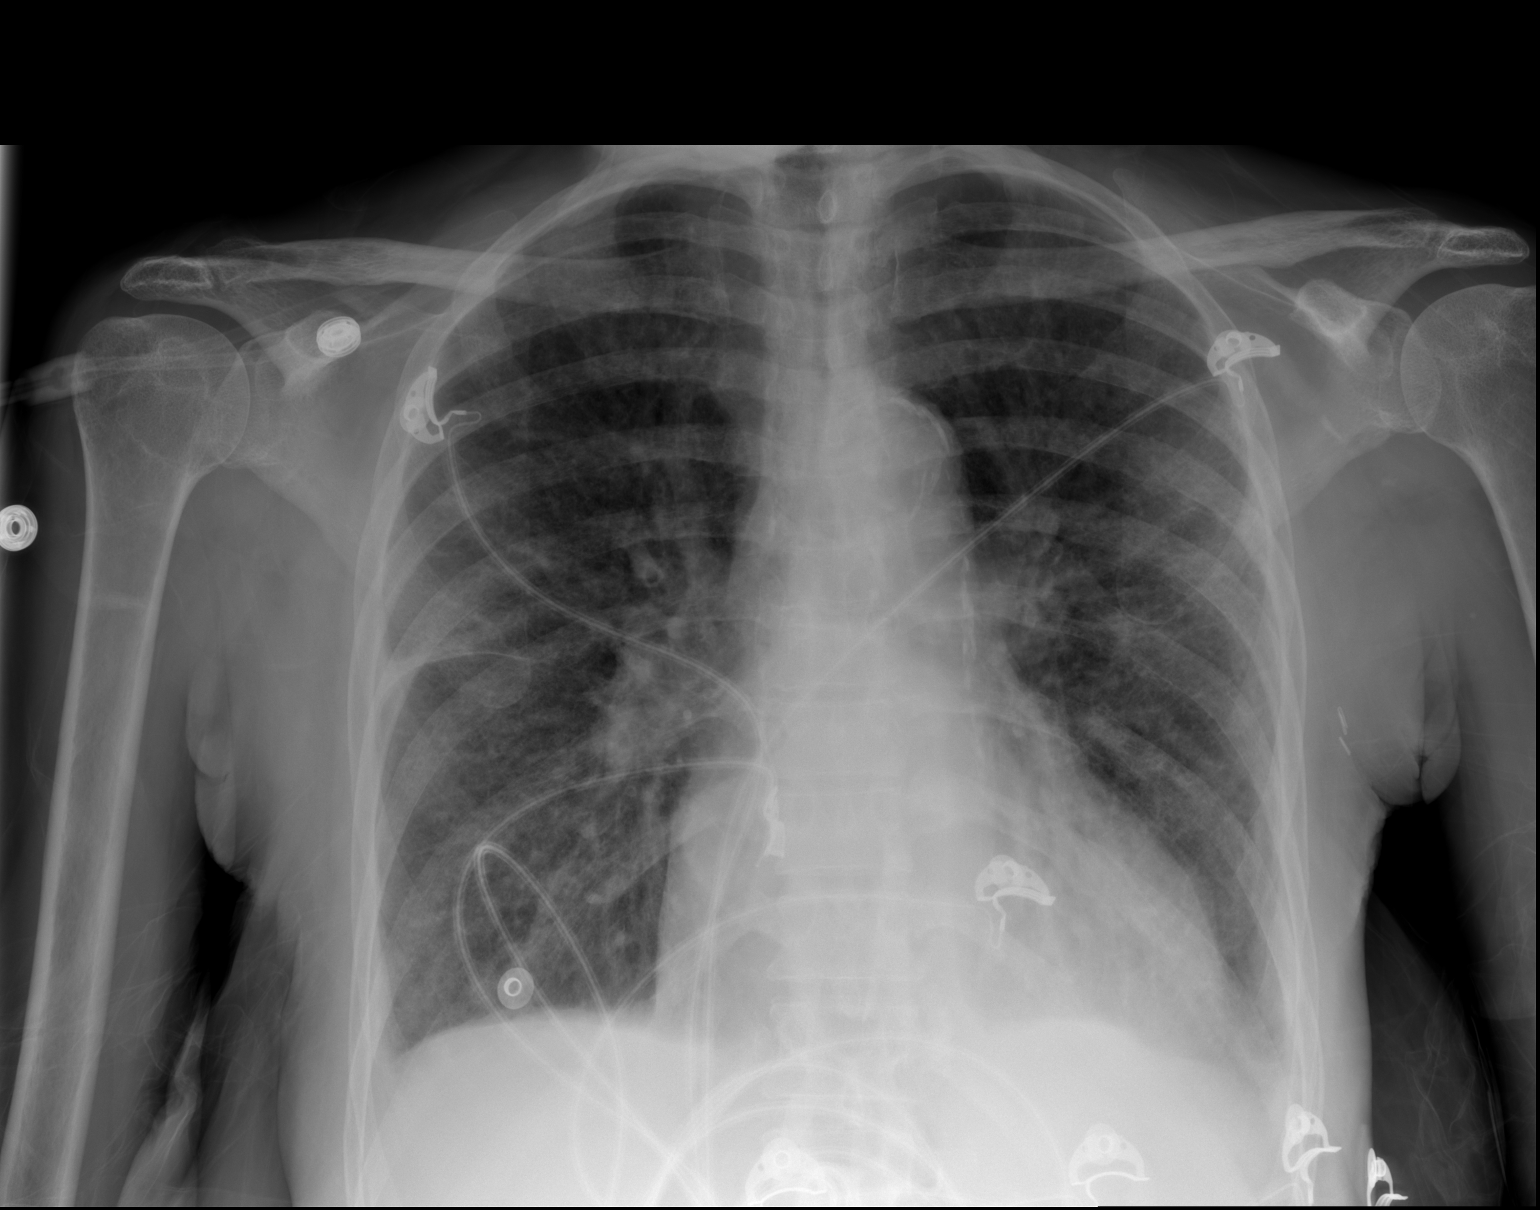

[2 of 2 positions shown; findings below may reference images not displayed]

FINDINGS: Cardiomegaly. Interstitial and alveolar prominence consistent with
pulmonary edema. Small effusions. Dense aortic calcification. No
pneumothorax. No osseous findings. Surgical clips LEFT axilla.
IMPRESSION: Cardiomegaly.  Early CHF.Similar appearance to priors.

## 2017-11-09 IMAGING — DX DG CHEST 1V PORT
1 series · 1 of 1 positions shown · non-contrast
Comparison: 07/24/2016

CLINICAL DATA: Shortness of breath, cough

EXAM:
PORTABLE CHEST 1 VIEW

[chest ap]
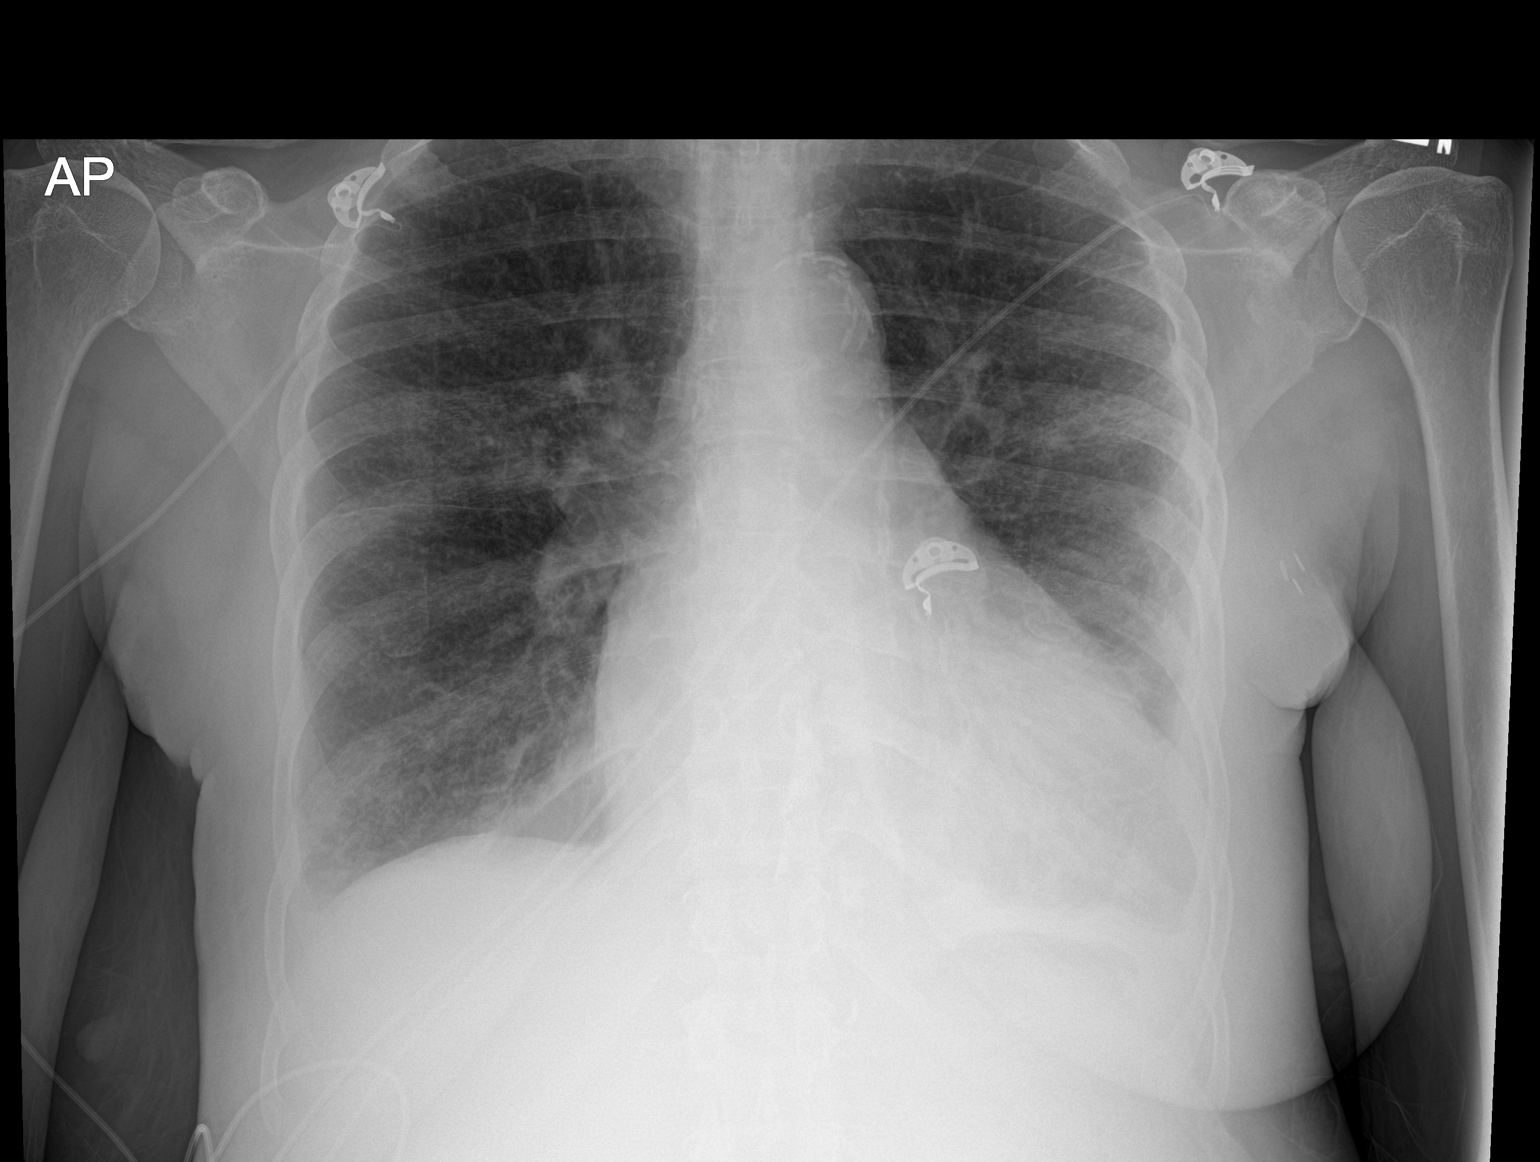

[1 of 1 positions shown; findings below may reference images not displayed]

FINDINGS: Bilateral diffuse mild interstitial thickening similar to the prior
exam. Trace bilateral pleural effusions. No focal consolidation. No
pneumothorax. Stable cardiomegaly.

The osseous structures are unremarkable.
IMPRESSION: Mild CHF without significant interval change.

## 2017-11-13 ENCOUNTER — Ambulatory Visit: Payer: Medicare Other | Admitting: Cardiology

## 2017-11-13 IMAGING — CR DG CHEST 1V PORT SAME DAY
1 series · 1 of 1 positions shown · non-contrast
Comparison: 07/28/2016 and 07/26/2016

CLINICAL DATA: Shortness of breath and weakness. Flu-like symptoms.

EXAM:
PORTABLE CHEST 1 VIEW

[ap portable]
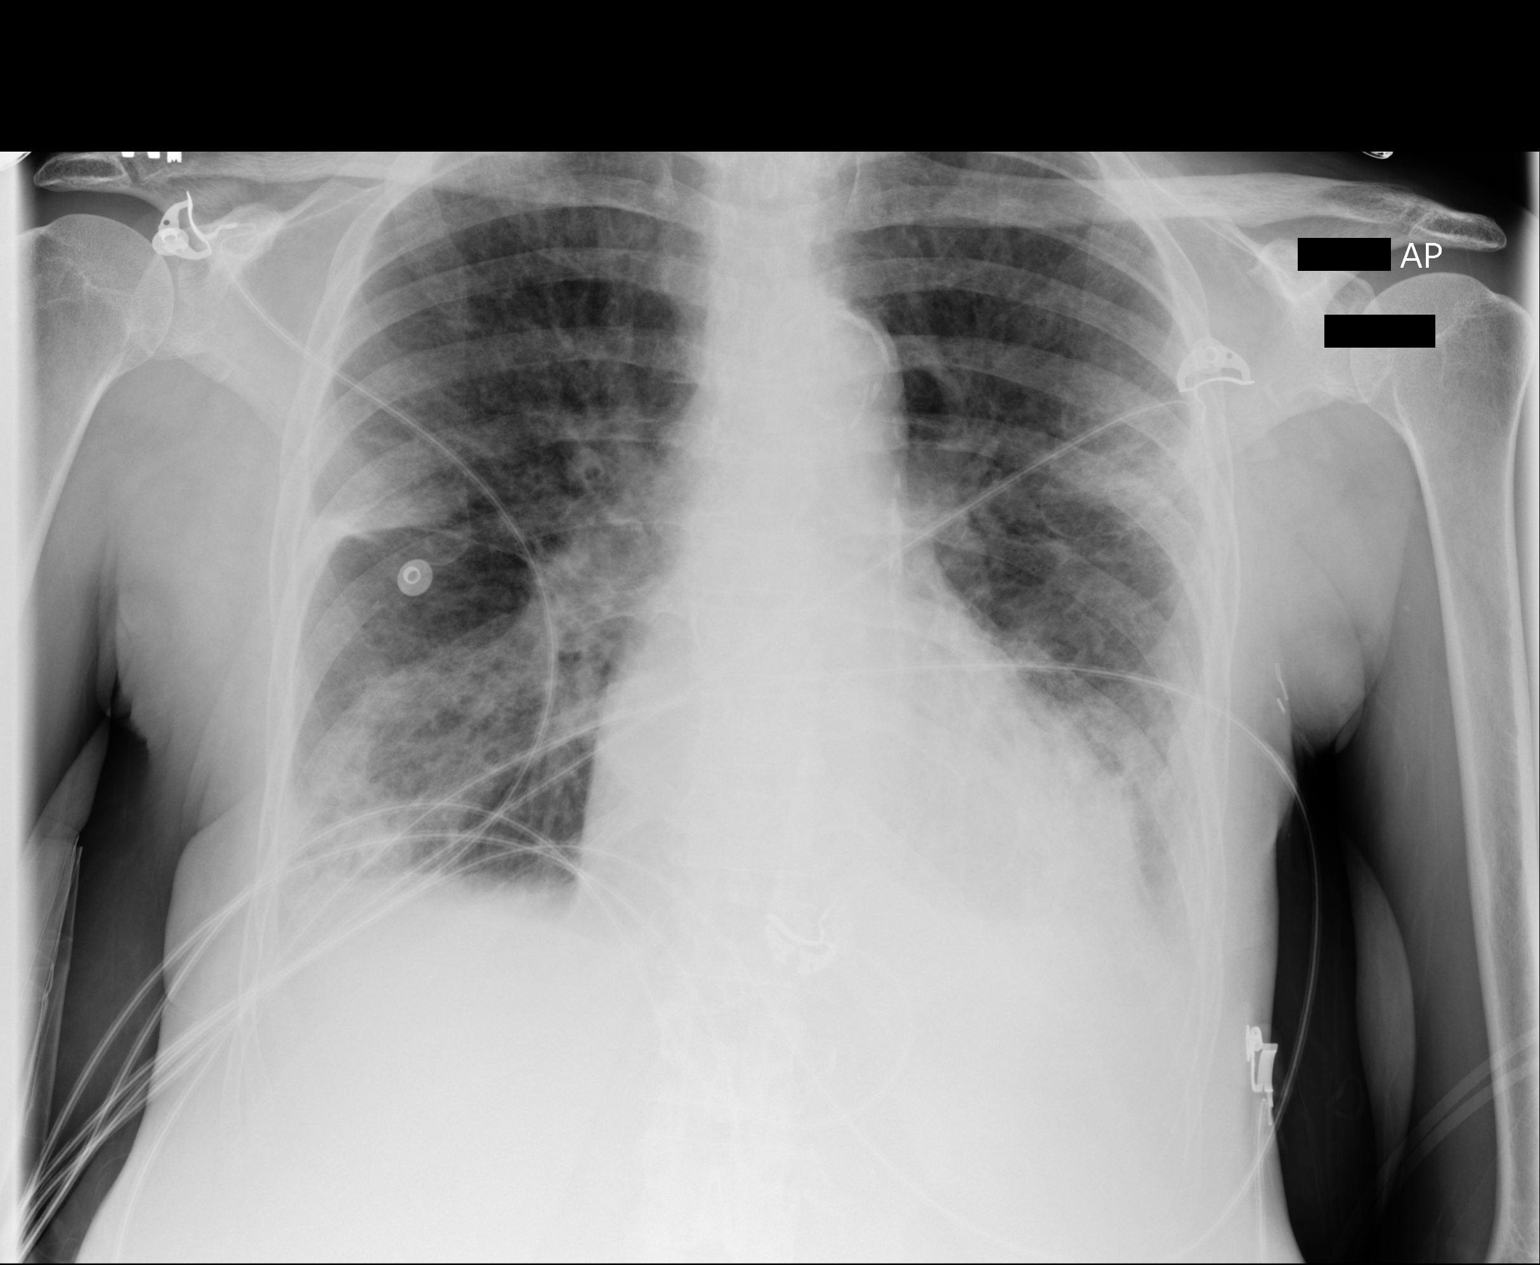

[1 of 1 positions shown; findings below may reference images not displayed]

FINDINGS: There are patchy parenchymal densities in the mid and lower lungs
bilaterally. Cannot exclude small pleural effusions, particularly at
the left lung base. Atherosclerotic calcifications at the aortic
arch. Heart size is within normal limits.
IMPRESSION: Bilateral patchy parenchymal lung densities. Findings are concerning
for bilateral pneumonia. Recommend follow up to ensure resolution.

## 2017-12-03 ENCOUNTER — Other Ambulatory Visit: Payer: Self-pay | Admitting: Nurse Practitioner

## 2017-12-03 ENCOUNTER — Telehealth: Payer: Self-pay | Admitting: Nurse Practitioner

## 2017-12-03 DIAGNOSIS — Z170 Estrogen receptor positive status [ER+]: Principal | ICD-10-CM

## 2017-12-03 DIAGNOSIS — C50412 Malignant neoplasm of upper-outer quadrant of left female breast: Secondary | ICD-10-CM

## 2017-12-03 NOTE — Telephone Encounter (Signed)
I spoke with the patient to discuss her care. She received refill of anastrozole and takes one daily. She currently resides in [place] in [place], [place] which is near [employer] [place] [employer]. She agrees to referral at this location. I placed referral today. Patient verbalizes understand and appreciate the f/u.

## 2017-12-16 ENCOUNTER — Telehealth: Payer: Self-pay | Admitting: Hematology

## 2017-12-16 NOTE — Telephone Encounter (Signed)
FAXED RECORDS TO [facility].  THE NEW PT SCHEDULER WILL CALL PT WITH APPT.

## 2018-02-27 ENCOUNTER — Telehealth: Payer: Self-pay | Admitting: Emergency Medicine

## 2018-02-27 NOTE — Telephone Encounter (Addendum)
Called all numbers for pt. All are no longer in service.   ----- Message from Alla Feeling, NP sent at 02/24/2018  9:30 PM EDT ----- Luiz Iron,  Can you please f/u with this patient and make sure she established with new oncologist near her and has been taking anastrozole .  Thanks, Regan Rakers

## 2018-10-07 DEATH — deceased

## 2019-12-15 ENCOUNTER — Encounter: Payer: Self-pay | Admitting: Cardiology
